# Patient Record
Sex: Male | Born: 1945 | Race: White | Hispanic: No | Smoking: Current every day smoker
Health system: Southern US, Community
[De-identification: ages and names within clinical notes are randomized; demographics above are authoritative.]

## PROBLEM LIST (undated history)

## (undated) DIAGNOSIS — I2510 Atherosclerotic heart disease of native coronary artery without angina pectoris: Secondary | ICD-10-CM

## (undated) DIAGNOSIS — I10 Essential (primary) hypertension: Secondary | ICD-10-CM

## (undated) DIAGNOSIS — I82409 Acute embolism and thrombosis of unspecified deep veins of unspecified lower extremity: Secondary | ICD-10-CM

## (undated) DIAGNOSIS — Z981 Arthrodesis status: Secondary | ICD-10-CM

## (undated) DIAGNOSIS — Z972 Presence of dental prosthetic device (complete) (partial): Secondary | ICD-10-CM

## (undated) DIAGNOSIS — E785 Hyperlipidemia, unspecified: Secondary | ICD-10-CM

## (undated) DIAGNOSIS — C61 Malignant neoplasm of prostate: Secondary | ICD-10-CM

## (undated) DIAGNOSIS — Z87891 Personal history of nicotine dependence: Secondary | ICD-10-CM

## (undated) DIAGNOSIS — I719 Aortic aneurysm of unspecified site, without rupture: Secondary | ICD-10-CM

## (undated) HISTORY — DX: Personal history of nicotine dependence: Z87.891

## (undated) HISTORY — DX: Aortic aneurysm of unspecified site, without rupture: I71.9

## (undated) HISTORY — DX: Hyperlipidemia, unspecified: E78.5

## (undated) HISTORY — DX: Malignant neoplasm of prostate: C61

---

## 1992-02-02 DIAGNOSIS — M51379 Other intervertebral disc degeneration, lumbosacral region without mention of lumbar back pain or lower extremity pain: Secondary | ICD-10-CM | POA: Insufficient documentation

## 1992-02-02 DIAGNOSIS — M5137 Other intervertebral disc degeneration, lumbosacral region: Secondary | ICD-10-CM | POA: Insufficient documentation

## 1996-02-02 DIAGNOSIS — Z981 Arthrodesis status: Secondary | ICD-10-CM

## 1996-02-02 HISTORY — DX: Arthrodesis status: Z98.1

## 1996-02-02 HISTORY — PX: BACK SURGERY: SHX140

## 2006-06-29 DIAGNOSIS — G4700 Insomnia, unspecified: Secondary | ICD-10-CM | POA: Insufficient documentation

## 2006-07-11 ENCOUNTER — Ambulatory Visit: Payer: Self-pay | Admitting: Gastroenterology

## 2008-08-16 ENCOUNTER — Ambulatory Visit: Payer: Self-pay | Admitting: Family Medicine

## 2010-09-02 ENCOUNTER — Ambulatory Visit: Payer: Self-pay | Admitting: Family Medicine

## 2010-10-02 ENCOUNTER — Inpatient Hospital Stay: Payer: Self-pay | Admitting: Surgery

## 2010-10-02 HISTORY — PX: CHOLECYSTECTOMY: SHX55

## 2010-10-02 HISTORY — PX: CT SCAN: SHX5351

## 2011-01-04 ENCOUNTER — Ambulatory Visit: Payer: Self-pay | Admitting: Family Medicine

## 2011-04-13 ENCOUNTER — Ambulatory Visit: Payer: Self-pay | Admitting: Family Medicine

## 2011-10-05 ENCOUNTER — Ambulatory Visit: Payer: Self-pay | Admitting: Family Medicine

## 2011-10-05 HISTORY — PX: OTHER SURGICAL HISTORY: SHX169

## 2011-10-21 ENCOUNTER — Ambulatory Visit: Payer: Self-pay | Admitting: Vascular Surgery

## 2011-11-25 ENCOUNTER — Ambulatory Visit: Payer: Self-pay | Admitting: Vascular Surgery

## 2011-11-25 LAB — CBC
HCT: 42.6 % (ref 40.0–52.0)
HGB: 14.7 g/dL (ref 13.0–18.0)
MCH: 32.1 pg (ref 26.0–34.0)
MCHC: 34.5 g/dL (ref 32.0–36.0)
RBC: 4.58 10*6/uL (ref 4.40–5.90)
WBC: 9.9 10*3/uL (ref 3.8–10.6)

## 2011-11-25 LAB — BASIC METABOLIC PANEL
Anion Gap: 5 — ABNORMAL LOW (ref 7–16)
BUN: 10 mg/dL (ref 7–18)
Co2: 32 mmol/L (ref 21–32)
Creatinine: 1.04 mg/dL (ref 0.60–1.30)
EGFR (African American): >60
Glucose: 102 mg/dL — ABNORMAL HIGH (ref 65–99)
Sodium: 137 mmol/L (ref 136–145)

## 2011-12-02 ENCOUNTER — Inpatient Hospital Stay: Payer: Self-pay | Admitting: Vascular Surgery

## 2011-12-02 HISTORY — PX: CAROTID ENDARTERECTOMY: SUR193

## 2011-12-03 LAB — CBC WITH DIFFERENTIAL/PLATELET
Basophil #: 0.1 10*3/uL (ref 0.0–0.1)
Eosinophil #: 0.2 10*3/uL (ref 0.0–0.7)
Lymphocyte #: 2.3 10*3/uL (ref 1.0–3.6)
Lymphocyte %: 19.9 %
Monocyte %: 8.3 %
Neutrophil %: 69.2 %
Platelet: 173 10*3/uL (ref 150–440)
RBC: 4.56 10*6/uL (ref 4.40–5.90)
RDW: 13.2 % (ref 11.5–14.5)
WBC: 11.4 10*3/uL — ABNORMAL HIGH (ref 3.8–10.6)

## 2011-12-03 LAB — BASIC METABOLIC PANEL
Anion Gap: 9 (ref 7–16)
BUN: 9 mg/dL (ref 7–18)
Calcium, Total: 8.7 mg/dL (ref 8.5–10.1)
Chloride: 108 mmol/L — ABNORMAL HIGH (ref 98–107)
Co2: 27 mmol/L (ref 21–32)
EGFR (African American): >60
Osmolality: 285 (ref 275–301)

## 2011-12-03 LAB — PROTIME-INR
INR: 1
Prothrombin Time: 13.2 secs (ref 11.5–14.7)

## 2011-12-03 LAB — APTT: Activated PTT: 28.5 secs (ref 23.6–35.9)

## 2011-12-06 LAB — PATHOLOGY REPORT

## 2012-08-01 ENCOUNTER — Ambulatory Visit: Payer: Self-pay | Admitting: Urology

## 2012-08-03 ENCOUNTER — Ambulatory Visit: Payer: Self-pay | Admitting: Urology

## 2012-10-19 HISTORY — PX: PROSTATE SURGERY: SHX751

## 2013-05-10 LAB — LIPID PANEL
Cholesterol: 123 mg/dL (ref 0–200)
HDL: 55 mg/dL (ref 35–70)
LDL Cholesterol: 53 mg/dL
TRIGLYCERIDES: 76 mg/dL (ref 40–160)

## 2013-05-10 LAB — HEPATIC FUNCTION PANEL: ALT: 19 U/L (ref 10–40)

## 2013-12-13 LAB — BASIC METABOLIC PANEL
BUN: 16 mg/dL (ref 4–21)
Creatinine: 1.2 mg/dL (ref 0.6–1.3)
Glucose: 93 mg/dL
Potassium: 4.8 mmol/L (ref 3.4–5.3)
SODIUM: 143 mmol/L (ref 137–147)

## 2013-12-13 LAB — PSA: PSA: <0.1

## 2014-02-08 DIAGNOSIS — H2513 Age-related nuclear cataract, bilateral: Secondary | ICD-10-CM | POA: Diagnosis not present

## 2014-02-21 DIAGNOSIS — H2513 Age-related nuclear cataract, bilateral: Secondary | ICD-10-CM | POA: Diagnosis not present

## 2014-02-27 ENCOUNTER — Ambulatory Visit: Payer: Self-pay | Admitting: Ophthalmology

## 2014-02-27 DIAGNOSIS — H5703 Miosis: Secondary | ICD-10-CM | POA: Diagnosis not present

## 2014-02-27 DIAGNOSIS — Z79899 Other long term (current) drug therapy: Secondary | ICD-10-CM | POA: Diagnosis not present

## 2014-02-27 DIAGNOSIS — G8929 Other chronic pain: Secondary | ICD-10-CM | POA: Diagnosis not present

## 2014-02-27 DIAGNOSIS — H2513 Age-related nuclear cataract, bilateral: Secondary | ICD-10-CM | POA: Diagnosis not present

## 2014-02-27 DIAGNOSIS — Z720 Tobacco use: Secondary | ICD-10-CM | POA: Diagnosis not present

## 2014-02-27 DIAGNOSIS — M479 Spondylosis, unspecified: Secondary | ICD-10-CM | POA: Diagnosis not present

## 2014-02-27 DIAGNOSIS — Z7982 Long term (current) use of aspirin: Secondary | ICD-10-CM | POA: Diagnosis not present

## 2014-02-27 DIAGNOSIS — M549 Dorsalgia, unspecified: Secondary | ICD-10-CM | POA: Diagnosis not present

## 2014-02-27 DIAGNOSIS — I10 Essential (primary) hypertension: Secondary | ICD-10-CM | POA: Diagnosis not present

## 2014-02-27 DIAGNOSIS — H2512 Age-related nuclear cataract, left eye: Secondary | ICD-10-CM | POA: Diagnosis not present

## 2014-04-01 DIAGNOSIS — F17200 Nicotine dependence, unspecified, uncomplicated: Secondary | ICD-10-CM | POA: Diagnosis not present

## 2014-04-01 DIAGNOSIS — I10 Essential (primary) hypertension: Secondary | ICD-10-CM | POA: Diagnosis not present

## 2014-04-01 DIAGNOSIS — Z0000 Encounter for general adult medical examination without abnormal findings: Secondary | ICD-10-CM | POA: Diagnosis not present

## 2014-04-01 DIAGNOSIS — Z1389 Encounter for screening for other disorder: Secondary | ICD-10-CM | POA: Diagnosis not present

## 2014-04-01 DIAGNOSIS — M5136 Other intervertebral disc degeneration, lumbar region: Secondary | ICD-10-CM | POA: Diagnosis not present

## 2014-05-21 NOTE — Discharge Summary (Signed)
PATIENT NAME:  Justin Harrington, Justin Harrington MR#:  [id number] DATE OF BIRTH:  October 21, 1945  DATE OF ADMISSION:  12/02/2011 DATE OF DISCHARGE:  12/03/2011  ADMITTING/DISCHARGE DIAGNOSES:  1. High-grade right carotid artery stenosis.  2. Hyperlipidemia.   PROCEDURE PERFORMED DURING HOSPITALIZATION: Right carotid endarterectomy. Please see the dictated Operative Summary for those details.   BRIEF HISTORY: 69 year old white male who was found to have a high-grade right carotid artery stenosis. He was prepared for stroke risk reduction and risks and benefits were discussed.   HOSPITAL COURSE: The patient was admitted through same day surgery and taken to the Operating Room where a right carotid endarterectomy was performed. For full details of that, please see the dictated operative summary. He did well overnight without major issues. His neck had no significant swelling and his incision was clean, dry and intact. His neurologic exam was normal. His labs in the morning following procedure were good and he was tolerating diet and voiding after catheter removal. He was deemed stable for discharge and discharged home accompanied by family.   DIET: Regular.   ACTIVITIES: As tolerated.   MEDICATIONS: Change his aspirin from 325 to 81 mg daily and to add 75 mg of Plavix daily and take all of his previous home medications.   FOLLOWUP: In 3 to 4 weeks in the office.  ____________________________ Algernon Huxley, MD jsd:ap D: 12/03/2011 13:14:43 ET          T: 12/03/2011 13:19:35 ET                 JOB#: [id number] cc: Algernon Huxley, MD, <Dictator> Algernon Huxley MD ELECTRONICALLY SIGNED 12/06/2011 13:14

## 2014-05-21 NOTE — Op Note (Signed)
PATIENT NAME:  Justin Harrington, Justin Harrington MR#:  [id number] DATE OF BIRTH:  May 01, 1945  DATE OF PROCEDURE:  12/02/2011  PREOPERATIVE DIAGNOSIS:  1. High-grade right carotid artery stenosis.  2. Hypertension.  3. Hyperlipidemia.   POSTOPERATIVE DIAGNOSIS:  1. High-grade right coronary artery stenosis.  2. Hypertension.  3. Hyperlipidemia.  PROCEDURE: Right carotid endarterectomy with CorMatrix patch angioplasty.   SURGEON: Algernon Huxley, MD   ANESTHESIA: General.   ESTIMATED BLOOD LOSS: Approximately 50 mL.   INDICATION FOR PROCEDURE: The patient is a gentleman who I saw in the office. He was evaluated with noninvasive studies and CT angiogram which demonstrated a high-grade right carotid artery stenosis of 88% or greater. We discussed the options and stroke risk reduction favoring intervention. He desired to proceed with endarterectomy. The risks and benefits were discussed. Informed consent was obtained.   DESCRIPTION OF PROCEDURE: The patient was brought to the operative suite. After an adequate level of general anesthesia was attained, the right neck was sterilely prepped and draped and a sterile surgical field was created. He was placed in modified beach chair position. An incision was created along the anterior border of the sternocleidomastoid, and we dissected down through platysma with electrocautery. The sternocleidomastoid was retracted laterally. This identified the facial vein, which was ligated and divided between silk ties. Below this was the carotid bifurcation. I encircled the common carotid artery, external carotid and internal carotid artery distal to the lesion with vessel loops, systemically heparinized the patient, and allowed this to circulate for approximately 5 to 6 minutes. I then pulled up control on the vessel loops. An anterior arteriotomy was created with an 11 blade and extended with Potts scissors. The Pruitt-Inahara shunt was placed first in the internal carotid artery,  flushed and de-aired, then the common carotid artery, flushed and de-aired, and the flow was then restored at this point. Endarterectomy was performed in the typical fashion. An eversion endarterectomy was performed in the external carotid artery. The proximal endpoint was cut flush with tenotomy scissors, and a nice feathered endpoint was created at the distal endpoint with gentle traction. The distal endpoint was tacked down with two 7-0 Prolene tacking sutures. The arterial defect was then closed with a CorMatrix extracellular matrix patch. We started at the distal endpoint, cut and beveled and run one-half the length of the arteriotomy. The patch was then cut and beveled to an appropriate length proximally, and a second 6-0 Prolene was started at the proximal endpoint. The medial suture line was run and tied together. The lateral suture line was run approximately one-quarter length of the arteriotomy, and then the shunt was removed. The arteriotomy was then completed after flushing through the external, internal, and common carotid arteries and flushed with heparinized saline. Several cardiac cycles were allowed to traverse up the external carotid artery prior to release of control. Approximately two minutes passed from clamp to shunt placement, and then two minutes passed from shunt removal to restoration. The wound was then irrigated, closed with 3-0 Vicryl sutures in the sternocleidomastoid space. A running 3-0 Vicryl was used to close platysma, and the skin was closed with 4-0 Monocryl. Dermabond was placed as a dressing. The patient tolerated the procedure well and was taken to the recovery room  in stable condition. ____________________________ Algernon Huxley, MD jsd:cbb D: 12/02/2011 15:05:48 ET T: 12/02/2011 15:27:19 ET JOB#: [id number]  cc: Algernon Huxley, MD, <Dictator> Algernon Huxley MD ELECTRONICALLY SIGNED 12/06/2011 13:14

## 2014-07-09 ENCOUNTER — Telehealth: Payer: Self-pay | Admitting: Family Medicine

## 2014-07-09 NOTE — Telephone Encounter (Signed)
Please advise 

## 2014-07-09 NOTE — Telephone Encounter (Signed)
Pt stated he got notice for Jury duty in July and would like Dr. Caryn Section to write a letter stating that due to his health and back pain he can not serve on jury duty. Thanks TNP

## 2014-07-10 ENCOUNTER — Encounter: Payer: Self-pay | Admitting: Family Medicine

## 2014-07-11 NOTE — Telephone Encounter (Signed)
Patient notified

## 2014-07-24 ENCOUNTER — Other Ambulatory Visit: Payer: Self-pay | Admitting: Family Medicine

## 2014-07-24 DIAGNOSIS — E7800 Pure hypercholesterolemia, unspecified: Secondary | ICD-10-CM

## 2014-07-24 DIAGNOSIS — E785 Hyperlipidemia, unspecified: Secondary | ICD-10-CM | POA: Insufficient documentation

## 2014-07-24 DIAGNOSIS — G4700 Insomnia, unspecified: Secondary | ICD-10-CM

## 2014-07-24 DIAGNOSIS — I10 Essential (primary) hypertension: Secondary | ICD-10-CM

## 2014-07-24 MED ORDER — TRAZODONE HCL 150 MG PO TABS: 150.0000 mg | ORAL_TABLET | Freq: Every day | ORAL | Status: DC

## 2014-07-24 MED ORDER — HYDROCHLOROTHIAZIDE 25 MG PO TABS: 25.0000 mg | ORAL_TABLET | Freq: Every day | ORAL | Status: DC

## 2014-07-24 MED ORDER — ATORVASTATIN CALCIUM 80 MG PO TABS: 80.0000 mg | ORAL_TABLET | Freq: Every day | ORAL | Status: DC

## 2014-07-24 NOTE — Telephone Encounter (Signed)
Pt contacted office for refill request on the following medications:  Trazodone HCI 150mg , Lipitor 80mg  and Hydrochlorothiazide 25mg . 90 day supply.  [facility] mail order.  RV#[phone]/MD

## 2014-08-06 ENCOUNTER — Other Ambulatory Visit: Payer: Self-pay | Admitting: Family Medicine

## 2014-08-06 DIAGNOSIS — M5136 Other intervertebral disc degeneration, lumbar region: Secondary | ICD-10-CM

## 2014-08-06 NOTE — Telephone Encounter (Signed)
Pt called to pick up his RX for pain medication  tp

## 2014-08-07 MED ORDER — OXYCODONE HCL 30 MG PO TABS: 30.0000 mg | ORAL_TABLET | ORAL | Status: DC

## 2014-09-02 ENCOUNTER — Other Ambulatory Visit: Payer: Self-pay | Admitting: Family Medicine

## 2014-09-02 NOTE — Telephone Encounter (Addendum)
Pt contacted office for refill request on the following medications:   OXYCODONE 30MG  IMMEDIATE REL.   YV#[phone]/PZ

## 2014-09-02 NOTE — Telephone Encounter (Signed)
Refill request for Oxycodone 30 mg Last filled by MD on- 07/02/2014 #180 x0 Last Appt: 04/01/2014 Next Appt: none Please advise refill?

## 2014-09-03 DIAGNOSIS — R972 Elevated prostate specific antigen [PSA]: Secondary | ICD-10-CM | POA: Insufficient documentation

## 2014-09-03 DIAGNOSIS — H269 Unspecified cataract: Secondary | ICD-10-CM | POA: Insufficient documentation

## 2014-09-03 DIAGNOSIS — Z86718 Personal history of other venous thrombosis and embolism: Secondary | ICD-10-CM | POA: Insufficient documentation

## 2014-09-03 DIAGNOSIS — Z8546 Personal history of malignant neoplasm of prostate: Secondary | ICD-10-CM | POA: Insufficient documentation

## 2014-09-03 MED ORDER — OXYCODONE HCL 30 MG PO TABS: ORAL_TABLET | ORAL | Status: DC

## 2014-09-04 ENCOUNTER — Ambulatory Visit (INDEPENDENT_AMBULATORY_CARE_PROVIDER_SITE_OTHER): Payer: Commercial Managed Care - HMO | Admitting: Family Medicine

## 2014-09-04 ENCOUNTER — Telehealth: Payer: Self-pay | Admitting: Family Medicine

## 2014-09-04 ENCOUNTER — Other Ambulatory Visit: Payer: Self-pay | Admitting: Family Medicine

## 2014-09-04 ENCOUNTER — Encounter: Payer: Self-pay | Admitting: Family Medicine

## 2014-09-04 VITALS — BP 92/60 | HR 94 | Temp 98.8°F | Resp 16 | Wt 178.0 lb

## 2014-09-04 DIAGNOSIS — N390 Urinary tract infection, site not specified: Secondary | ICD-10-CM

## 2014-09-04 DIAGNOSIS — R300 Dysuria: Secondary | ICD-10-CM | POA: Diagnosis not present

## 2014-09-04 DIAGNOSIS — R05 Cough: Secondary | ICD-10-CM | POA: Diagnosis not present

## 2014-09-04 DIAGNOSIS — R059 Cough, unspecified: Secondary | ICD-10-CM | POA: Insufficient documentation

## 2014-09-04 LAB — POCT URINALYSIS DIPSTICK
GLUCOSE UA: NEGATIVE
Nitrite, UA: NEGATIVE
Protein, UA: 100
Spec Grav, UA: >=1.03
Urobilinogen, UA: 1
pH, UA: 6

## 2014-09-04 MED ORDER — OXYCODONE HCL 30 MG PO TABS: ORAL_TABLET | ORAL | Status: DC

## 2014-09-04 MED ORDER — CIPROFLOXACIN HCL 500 MG PO TABS: 500.0000 mg | ORAL_TABLET | Freq: Two times a day (BID) | ORAL | Status: AC

## 2014-09-04 NOTE — Telephone Encounter (Signed)
Please review. Thanks!  

## 2014-09-04 NOTE — Telephone Encounter (Signed)
Pt states his Rx for oxycodone (ROXICODONE) 30 MG immediate release tablet was only written for 30 tablets.  Pt states has always got 180 tablet in the past.  CB#[phone]/MW

## 2014-09-04 NOTE — Telephone Encounter (Signed)
Pt advised-aa 

## 2014-09-04 NOTE — Progress Notes (Signed)
Patient: Justin Harrington Male    DOB: April 26, 1945   69 y.o.   MRN: [id number] Visit Date: 09/04/2014  Today's Provider: [name], MD   Chief Complaint  Patient presents with  . Urinary Urgency   Subjective:    HPI UTI Symptoms: Patient comes in today stating he has been having urinary frequency, urinary urgency and burling during urination for 1 month. He has also had cloudy urine with an odor. Patient denies any blood lin the urine. Patient has not taken anything to help with symptoms.  Patient has tried increasing his water intake.    Cough: He reports persistent cough for the last week. No dyspnea. Has had some drainage and chest congestion.     No Known Allergies Previous Medications   ASPIRIN EC 81 MG TABLET    Take 1 tablet by mouth daily.   ATORVASTATIN (LIPITOR) 80 MG TABLET    Take 1 tablet by mouth at bedtime.   HYDROCHLOROTHIAZIDE (HYDRODIURIL) 25 MG TABLET    Take 1 tablet by mouth daily.   MELATONIN 5 MG TABS    Take 1 tablet by mouth at bedtime.   OXYCODONE (ROXICODONE) 30 MG IMMEDIATE RELEASE TABLET    One every four hours as needed   TRAZODONE (DESYREL) 150 MG TABLET    Take 1 tablet by mouth at bedtime as needed.    Review of Systems  Constitutional: Negative for fever, chills, diaphoresis and fatigue.  HENT: Positive for rhinorrhea and sore throat. Negative for congestion, ear pain, nosebleeds, postnasal drip, sinus pressure, sneezing, tinnitus and trouble swallowing.   Respiratory: Positive for cough (dry cough).   Gastrointestinal: Positive for abdominal pain (right lower abdomen).  Genitourinary: Positive for dysuria, urgency, frequency and decreased urine volume. Negative for hematuria, flank pain, discharge, penile swelling, scrotal swelling, genital sores, penile pain and testicular pain.  Musculoskeletal: Positive for back pain.  All other systems reviewed and are negative.   History  Substance Use Topics  . Smoking status: Current Every  Day Smoker -- 0.75 packs/day for 40 years    Types: Cigarettes  . Smokeless tobacco: Not on file  . Alcohol Use: No   Objective:   BP 92/60 mmHg  Pulse 94  Temp(Src) 98.8 F (37.1 C) (Oral)  Resp 16  Wt 178 lb (80.74 kg)  SpO2 93%  Physical Exam  General Appearance:    Alert, cooperative, no distress  Eyes:    PERRL, conjunctiva/corneas clear, EOM's intact       Lungs:     Clear to auscultation bilaterally, respirations unlabored  Heart:    Regular rate and rhythm  Neurologic:   Awake, alert, oriented x 3. No apparent focal neurological           defect.   HEENT:   Mild congestion with post nasal drainage noted.         Assessment & Plan:     1. Dysuria  - POCT urinalysis dipstick  2. Urinary tract infection without hematuria, site unspecified  - ciprofloxacin (CIPRO) 500 MG tablet; Take 1 tablet (500 mg total) by mouth 2 (two) times daily.  Dispense: 20 tablet; Refill: 0  Call if symptoms change or if not rapidly improving.    3. Cough Likely due to post nasal drainage. Is going to try OTC Mucinex and call if not improving in a few days.        [name], MD  [facility]

## 2014-09-04 NOTE — Patient Instructions (Signed)
Recommend OTC Mucinex or guaifenesin for cough and chest congestion.

## 2014-09-04 NOTE — Addendum Note (Signed)
Addended by: Randal Buba on: 09/04/2014 09:23 AM   Modules accepted: Orders

## 2014-09-04 NOTE — Telephone Encounter (Signed)
OK. Have printed new prescription that he can pick up.

## 2014-09-06 LAB — URINE CULTURE

## 2014-10-08 DIAGNOSIS — H2511 Age-related nuclear cataract, right eye: Secondary | ICD-10-CM | POA: Diagnosis not present

## 2014-10-09 ENCOUNTER — Other Ambulatory Visit: Payer: Self-pay | Admitting: Family Medicine

## 2014-10-09 MED ORDER — OXYCODONE HCL 30 MG PO TABS: ORAL_TABLET | ORAL | Status: DC

## 2014-10-09 NOTE — Telephone Encounter (Signed)
Pt contacted office for refill request on the following medications:  oxycodone (ROXICODONE) 30 MG immediate release tablet.  WH#[phone]/KZ

## 2014-11-07 ENCOUNTER — Other Ambulatory Visit: Payer: Self-pay | Admitting: Family Medicine

## 2014-11-07 MED ORDER — OXYCODONE HCL 30 MG PO TABS: ORAL_TABLET | ORAL | Status: DC

## 2014-11-07 NOTE — Telephone Encounter (Signed)
Pt is requesting the a refill on oxycodone (ROXICODONE) 30 MG 180 tablets.   He is going out of town and needs it by Friday afternoon.

## 2014-11-07 NOTE — Telephone Encounter (Signed)
Last Ov was on 09/04/2014. Last refill was on 10/09/2014.  Thanks,

## 2014-12-10 ENCOUNTER — Other Ambulatory Visit: Payer: Self-pay | Admitting: Family Medicine

## 2014-12-10 MED ORDER — OXYCODONE HCL 30 MG PO TABS: ORAL_TABLET | ORAL | Status: DC

## 2014-12-10 NOTE — Telephone Encounter (Signed)
Please refill  oxycodone (ROXICODONE) 30 MG immediate release tablet  Thank sTeri

## 2014-12-17 DIAGNOSIS — I714 Abdominal aortic aneurysm, without rupture: Secondary | ICD-10-CM | POA: Diagnosis not present

## 2014-12-17 DIAGNOSIS — I10 Essential (primary) hypertension: Secondary | ICD-10-CM | POA: Diagnosis not present

## 2014-12-17 DIAGNOSIS — I6523 Occlusion and stenosis of bilateral carotid arteries: Secondary | ICD-10-CM | POA: Diagnosis not present

## 2014-12-17 DIAGNOSIS — E785 Hyperlipidemia, unspecified: Secondary | ICD-10-CM | POA: Diagnosis not present

## 2014-12-17 DIAGNOSIS — I6529 Occlusion and stenosis of unspecified carotid artery: Secondary | ICD-10-CM | POA: Diagnosis not present

## 2015-01-06 ENCOUNTER — Other Ambulatory Visit: Payer: Self-pay | Admitting: Family Medicine

## 2015-01-06 MED ORDER — OXYCODONE HCL 30 MG PO TABS: ORAL_TABLET | ORAL | Status: DC

## 2015-01-06 NOTE — Telephone Encounter (Signed)
Pt contacted office for refill request on the following medications: oxycodone (ROXICODONE) 30 MG immediate release tablet. Thanks TNP

## 2015-01-18 DIAGNOSIS — I6529 Occlusion and stenosis of unspecified carotid artery: Secondary | ICD-10-CM | POA: Diagnosis not present

## 2015-01-18 DIAGNOSIS — I700 Atherosclerosis of aorta: Secondary | ICD-10-CM | POA: Diagnosis not present

## 2015-01-18 DIAGNOSIS — I10 Essential (primary) hypertension: Secondary | ICD-10-CM | POA: Diagnosis not present

## 2015-01-18 DIAGNOSIS — E782 Mixed hyperlipidemia: Secondary | ICD-10-CM | POA: Diagnosis not present

## 2015-01-18 DIAGNOSIS — I714 Abdominal aortic aneurysm, without rupture: Secondary | ICD-10-CM | POA: Diagnosis not present

## 2015-01-18 DIAGNOSIS — Z0000 Encounter for general adult medical examination without abnormal findings: Secondary | ICD-10-CM | POA: Diagnosis not present

## 2015-01-18 DIAGNOSIS — M545 Low back pain: Secondary | ICD-10-CM | POA: Diagnosis not present

## 2015-01-18 DIAGNOSIS — G4700 Insomnia, unspecified: Secondary | ICD-10-CM | POA: Diagnosis not present

## 2015-02-06 ENCOUNTER — Other Ambulatory Visit: Payer: Self-pay | Admitting: Family Medicine

## 2015-02-06 NOTE — Telephone Encounter (Signed)
Pt needs refill oxycodone (ROXICODONE) 30 MG immediate release tablet  Please call when ready.  Thanks, [employer]

## 2015-02-06 NOTE — Telephone Encounter (Signed)
Patient requesting refill. 

## 2015-02-07 MED ORDER — OXYCODONE HCL 30 MG PO TABS: ORAL_TABLET | ORAL | Status: DC

## 2015-03-10 ENCOUNTER — Other Ambulatory Visit: Payer: Self-pay | Admitting: Family Medicine

## 2015-03-10 MED ORDER — OXYCODONE HCL 30 MG PO TABS: ORAL_TABLET | ORAL | Status: DC

## 2015-03-10 NOTE — Telephone Encounter (Signed)
Pt contacted office for refill request on the following medications:  oxycodone (ROXICODONE) 30 MG immediate release tablet.  CB#[phone]/MW ° °

## 2015-03-28 ENCOUNTER — Other Ambulatory Visit: Payer: Self-pay | Admitting: Family Medicine

## 2015-03-28 DIAGNOSIS — E7800 Pure hypercholesterolemia, unspecified: Secondary | ICD-10-CM

## 2015-03-28 NOTE — Telephone Encounter (Signed)
Pt contacted office for refill request on the following medications:  90 day supply  [facility] [place].  CB#[phone]/MW  hydrochlorothiazide (HYDRODIURIL) 25 MG tablet  atorvastatin (LIPITOR) 80 MG tablet  traZODone (DESYREL) 150 MG tablet

## 2015-03-31 MED ORDER — TRAZODONE HCL 150 MG PO TABS: 150.0000 mg | ORAL_TABLET | Freq: Every evening | ORAL | Status: DC | PRN

## 2015-03-31 MED ORDER — HYDROCHLOROTHIAZIDE 25 MG PO TABS: 25.0000 mg | ORAL_TABLET | Freq: Every day | ORAL | Status: DC

## 2015-03-31 MED ORDER — ATORVASTATIN CALCIUM 80 MG PO TABS: 80.0000 mg | ORAL_TABLET | Freq: Every day | ORAL | Status: DC

## 2015-04-07 ENCOUNTER — Other Ambulatory Visit: Payer: Self-pay | Admitting: Family Medicine

## 2015-04-07 MED ORDER — OXYCODONE HCL 30 MG PO TABS: ORAL_TABLET | ORAL | Status: DC

## 2015-04-07 NOTE — Telephone Encounter (Signed)
Pt contacted office for refill request on the following medications:  oxycodone (ROXICODONE) 30 MG immediate release tablet.  CB#[phone]/MW ° °

## 2015-04-09 ENCOUNTER — Ambulatory Visit (INDEPENDENT_AMBULATORY_CARE_PROVIDER_SITE_OTHER): Payer: PPO | Admitting: Family Medicine

## 2015-04-09 ENCOUNTER — Encounter: Payer: Self-pay | Admitting: Family Medicine

## 2015-04-09 VITALS — BP 104/60 | HR 96 | Temp 98.8°F | Resp 16 | Ht 73.0 in | Wt 182.0 lb

## 2015-04-09 DIAGNOSIS — M5136 Other intervertebral disc degeneration, lumbar region: Secondary | ICD-10-CM

## 2015-04-09 DIAGNOSIS — T402X5A Adverse effect of other opioids, initial encounter: Secondary | ICD-10-CM

## 2015-04-09 DIAGNOSIS — K5903 Drug induced constipation: Secondary | ICD-10-CM | POA: Diagnosis not present

## 2015-04-09 DIAGNOSIS — M51369 Other intervertebral disc degeneration, lumbar region without mention of lumbar back pain or lower extremity pain: Secondary | ICD-10-CM

## 2015-04-09 MED ORDER — NAPROXEN 500 MG PO TABS: 500.0000 mg | ORAL_TABLET | Freq: Two times a day (BID) | ORAL | Status: DC

## 2015-04-09 NOTE — Progress Notes (Signed)
       Patient: Justin Harrington Male    DOB: [birth year]   70 y.o.   MRN: [id number] Visit Date: 04/09/2015  Today's Provider: [name], MD   Chief Complaint  Patient presents with  . Medication Management   Subjective:    HPI  He states he would like to wean off of oxycodone which he takes for daily low back and hip pain. He has been on current regiment for many years, but is having increasing difficulties having bowel movements. He takes a powdered fiber product every day and has recently started taking a laxative. He states he gets very shaky, anxious and feels like skin is crawling if he doesn't take the oxycodone on schedule. He feels like he can get by with a milder pain medication.    No Known Allergies Previous Medications   ASPIRIN EC 81 MG TABLET    Take 1 tablet by mouth daily.   ATORVASTATIN (LIPITOR) 80 MG TABLET    Take 1 tablet (80 mg total) by mouth daily.   HYDROCHLOROTHIAZIDE (HYDRODIURIL) 25 MG TABLET    Take 1 tablet (25 mg total) by mouth daily.   OXYCODONE (ROXICODONE) 30 MG IMMEDIATE RELEASE TABLET    One every four hours as needed   TRAZODONE (DESYREL) 150 MG TABLET    Take 1 tablet (150 mg total) by mouth at bedtime.    Review of Systems  Constitutional: Negative for fever, chills and appetite change.  Respiratory: Negative for chest tightness, shortness of breath and wheezing.   Cardiovascular: Negative for chest pain and palpitations.  Gastrointestinal: Negative for nausea, vomiting and abdominal pain.    Social History  Substance Use Topics  . Smoking status: Current Every Day Smoker -- 0.75 packs/day for 40 years    Types: Cigarettes  . Smokeless tobacco: Not on file  . Alcohol Use: No   Objective:   BP 104/60 mmHg  Pulse 96  Temp(Src) 98.8 F (37.1 C) (Oral)  Resp 16  Ht 6\' 1"  (1.854 m)  Wt 182 lb (82.555 kg)  BMI 24.02 kg/m2  SpO2 96%  Physical Exam  General appearance: alert, well developed, well nourished, cooperative and in  no distress Head: Normocephalic, without obvious abnormality, atraumatic Lungs: Respirations even and unlabored Extremities: No gross deformities Skin: Skin color, texture, turgor normal. No rashes seen  Psych: Appropriate mood and affect. Neurologic: Mental status: Alert, oriented to person, place, and time, thought content appropriate.     Assessment & Plan:     1. Therapeutic opioid-induced constipation (OIC) Will work on weaning oxycodone as below. Continue daily fiber supplement and laxative as needed. If not improved with lower opioid dose will consider something like Amitiza  2. Degeneration of intervertebral disc of lumbar region He is going to reduce oxycodone to 1/2 of 30mg  tablet every four hours for the next week, then reduce to every 6-8 hours. If he does well on this he can call for prescription for 7.5mg  tablets. He is to follow up her in one month Start scheduled NSAID and recommend trial of OTC Lidocaine patches per label. - naproxen (NAPROSYN) 500 MG tablet; Take 1 tablet (500 mg total) by mouth 2 (two) times daily with a meal.  Dispense: 30 tablet; Refill: 1  Spent over half of this 30minute visit counseling regarding options for treatment of opioid withdrawel and chronic pain management.       [name], MD  [facility]

## 2015-04-09 NOTE — Patient Instructions (Addendum)
   Reduce oxycodone 30mg  to 1/2 tablet every four hours for the next week, then reduce to 1/2 tablet every six hours   Recommend try OTC Lidocaine 4% patch

## 2015-04-23 ENCOUNTER — Telehealth: Payer: Self-pay | Admitting: *Deleted

## 2015-04-23 NOTE — Telephone Encounter (Signed)
Called pt concerning a fax we received for a back brace. Need to no if pt requested this?

## 2015-04-24 NOTE — Telephone Encounter (Signed)
Patient returned call and stated that he did request back brace. Form returned to provider.

## 2015-04-28 ENCOUNTER — Telehealth: Payer: Self-pay | Admitting: Family Medicine

## 2015-04-28 NOTE — Telephone Encounter (Signed)
Pt is returning call.  [id number]

## 2015-04-28 NOTE — Telephone Encounter (Signed)
Return call

## 2015-05-08 ENCOUNTER — Encounter: Payer: Self-pay | Admitting: Family Medicine

## 2015-05-08 ENCOUNTER — Ambulatory Visit (INDEPENDENT_AMBULATORY_CARE_PROVIDER_SITE_OTHER): Payer: PPO | Admitting: Family Medicine

## 2015-05-08 VITALS — BP 110/58 | HR 83 | Temp 98.4°F | Resp 16 | Ht 73.0 in | Wt 184.0 lb

## 2015-05-08 DIAGNOSIS — M5136 Other intervertebral disc degeneration, lumbar region: Secondary | ICD-10-CM | POA: Diagnosis not present

## 2015-05-08 DIAGNOSIS — T402X5A Adverse effect of other opioids, initial encounter: Secondary | ICD-10-CM | POA: Diagnosis not present

## 2015-05-08 DIAGNOSIS — K5903 Drug induced constipation: Secondary | ICD-10-CM | POA: Diagnosis not present

## 2015-05-08 MED ORDER — OXYCODONE HCL 30 MG PO TABS: ORAL_TABLET | ORAL | Status: DC

## 2015-05-08 MED ORDER — NALOXEGOL OXALATE 25 MG PO TABS: 25.0000 mg | ORAL_TABLET | Freq: Every day | ORAL | Status: DC

## 2015-05-08 NOTE — Progress Notes (Signed)
       Patient: Justin Harrington Male    DOB: Oct 15, 1945   70 y.o.   MRN: [id number] Visit Date: 05/08/2015  Today's Provider: [name], MD   Chief Complaint  Patient presents with  . Follow-up   Subjective:    HPI  Follow-up for Therapeutic opioid-induced constipation from 04/09/2015;  Follow-up for  Degeneration of intervertebral disc of lumbar region from 04/09/2015;  He has reduced oxycodone to 5 a day. Has tried anti-inflammatory which didn't help at all.Is going to try to reduce down to 4 a day.   Patient is still having constipation. Only having one bowel movement per week. Having to take a handful of laxatives to have BM. He want's to try Movantik   No Known Allergies Previous Medications   ASPIRIN EC 81 MG TABLET    Take 1 tablet by mouth daily.   ATORVASTATIN (LIPITOR) 80 MG TABLET    Take 1 tablet (80 mg total) by mouth daily.   HYDROCHLOROTHIAZIDE (HYDRODIURIL) 25 MG TABLET    Take 1 tablet (25 mg total) by mouth daily.   OXYCODONE (ROXICODONE) 30 MG IMMEDIATE RELEASE TABLET    One every four hours as needed   TRAZODONE (DESYREL) 150 MG TABLET    Take 1 tablet (150 mg total) by mouth at bedtime.    Review of Systems  Constitutional: Negative for fever, chills and appetite change.  Respiratory: Negative for chest tightness, shortness of breath and wheezing.   Cardiovascular: Negative for chest pain and palpitations.  Gastrointestinal: Positive for constipation. Negative for nausea, vomiting and abdominal pain.    Social History  Substance Use Topics  . Smoking status: Current Every Day Smoker -- 0.75 packs/day for 40 years    Types: Cigarettes  . Smokeless tobacco: Not on file  . Alcohol Use: No   Objective:   BP 110/58 mmHg  Pulse 83  Temp(Src) 98.4 F (36.9 C) (Oral)  Resp 16  Ht 6\' 1"  (1.854 m)  Wt 184 lb (83.462 kg)  BMI 24.28 kg/m2  SpO2 95%  Physical Exam  General appearance: alert, well developed, well nourished, cooperative and in no  distress Head: Normocephalic, without obvious abnormality, atraumatic Lungs: Respirations even and unlabored Extremities: No gross deformities Skin: Skin color, texture, turgor normal. No rashes seen  Psych: Appropriate mood and affect. Neurologic: Mental status: Alert, oriented to person, place, and time, thought content appropriate.     Assessment & Plan:     1. Therapeutic opioid-induced constipation (OIC) Start Movantik - naloxegol oxalate (MOVANTIK) 25 MG TABS tablet; Take 1 tablet (25 mg total) by mouth daily.  Dispense: 30 tablet; Refill: 2  2. Degeneration of intervertebral disc of lumbar region Continue to work on weaning oxycodone.  - oxycodone (ROXICODONE) 30 MG immediate release tablet; One every four hours as needed  Dispense: 150 tablet; Refill: 0        [name], MD  [facility]

## 2015-05-19 DIAGNOSIS — M5137 Other intervertebral disc degeneration, lumbosacral region: Secondary | ICD-10-CM | POA: Diagnosis not present

## 2015-05-30 ENCOUNTER — Other Ambulatory Visit: Payer: Self-pay | Admitting: *Deleted

## 2015-05-30 NOTE — Telephone Encounter (Signed)
Called left message on vm, for pt to return call concerning a fax we received requesting a back brace. Want to know if pt requested this?

## 2015-06-05 ENCOUNTER — Encounter: Payer: Self-pay | Admitting: Family Medicine

## 2015-06-05 ENCOUNTER — Ambulatory Visit (INDEPENDENT_AMBULATORY_CARE_PROVIDER_SITE_OTHER): Payer: PPO | Admitting: Family Medicine

## 2015-06-05 VITALS — BP 108/70 | HR 86 | Temp 97.6°F | Resp 16 | Wt 182.0 lb

## 2015-06-05 DIAGNOSIS — M545 Low back pain, unspecified: Secondary | ICD-10-CM

## 2015-06-05 DIAGNOSIS — K5903 Drug induced constipation: Secondary | ICD-10-CM | POA: Diagnosis not present

## 2015-06-05 DIAGNOSIS — M5136 Other intervertebral disc degeneration, lumbar region: Secondary | ICD-10-CM | POA: Diagnosis not present

## 2015-06-05 DIAGNOSIS — T402X5A Adverse effect of other opioids, initial encounter: Secondary | ICD-10-CM | POA: Diagnosis not present

## 2015-06-05 DIAGNOSIS — Z720 Tobacco use: Secondary | ICD-10-CM

## 2015-06-05 MED ORDER — OXYCODONE HCL 30 MG PO TABS: ORAL_TABLET | ORAL | Status: DC

## 2015-06-05 NOTE — Progress Notes (Signed)
Patient: Justin Harrington Male    DOB: 02/02/1945   70 y.o.   MRN: [id number] Visit Date: 06/05/2015  Today's Provider: [name], MD   Chief Complaint  Patient presents with  . Back Pain    4 week follow up  . Constipation  . Hyperlipidemia   Subjective:    HPI Follow up Degeneration of intervertebral disc of lumbar region:  Patient was last seen 4 weeks ago. Management during that visit includes advising patient to continue weaning Oxycodone. Patient comes in today stating he has weaned himself down to 4 pills of Oxycodone daily. Patient states his back pain is unchanged since the last office visit.   Follow up Therapeutic Opoid-Induced Constipation:  Last office visit was 4 weeks ago. Changes made during that visit icludes starting Movantik. Patient states he took [place] for 4 days and saw no improvement with constipation so he stopped taking it. Patient states the [place] also caused him to have Headache, sweats, and dizziness. Patient has been taking Castor oil and prune juice which has helped improve constipation. Patient has an average of 1 bowel movement per week.   He also trying to quit smoking. He has smoked up to 1ppd for the last 50 years and request imaging studies of lungs.     No Known Allergies Previous Medications   ASPIRIN EC 81 MG TABLET    Take 1 tablet by mouth daily.   ATORVASTATIN (LIPITOR) 80 MG TABLET    Take 1 tablet (80 mg total) by mouth daily.   HYDROCHLOROTHIAZIDE (HYDRODIURIL) 25 MG TABLET    Take 1 tablet (25 mg total) by mouth daily.   NALOXEGOL OXALATE (MOVANTIK) 25 MG TABS TABLET    Take 1 tablet (25 mg total) by mouth daily.   OXYCODONE (ROXICODONE) 30 MG IMMEDIATE RELEASE TABLET    One every four hours as needed   TRAZODONE (DESYREL) 150 MG TABLET    Take 1 tablet (150 mg total) by mouth at bedtime.    Review of Systems  Constitutional: Negative for fever, chills and appetite change.  Respiratory: Negative for chest tightness,  shortness of breath and wheezing.   Cardiovascular: Negative for chest pain and palpitations.  Gastrointestinal: Positive for constipation. Negative for nausea, vomiting and abdominal pain.  Musculoskeletal: Positive for back pain.    Social History  Substance Use Topics  . Smoking status: Current Every Day Smoker -- 0.75 packs/day for 40 years    Types: Cigarettes  . Smokeless tobacco: Not on file  . Alcohol Use: No   Objective:   BP 108/70 mmHg  Pulse 86  Temp(Src) 97.6 F (36.4 C) (Oral)  Resp 16  Wt 182 lb (82.555 kg)  SpO2 98%  Physical Exam  General appearance: alert, well developed, well nourished, cooperative and in no distress Head: Normocephalic, without obvious abnormality, atraumatic Lungs: Respirations even and unlabored Extremities: No gross deformities Back: Tender over LS spine.       Assessment & Plan:     1. Degeneration of intervertebral disc of lumbar region Continue QID oxycodone. Work on reducing to TID.  - DG Lumbar Spine Complete; Future - oxycodone (ROXICODONE) 30 MG immediate release tablet; One every four hours as needed  Dispense: 120 tablet; Refill: 0  2. Tobacco abuse  - CT CHEST LUNG CA SCREEN LOW DOSE W/O CM; Future  3. Bilateral low back pain without sciatica  - DG Lumbar Spine Complete; Future  4. Therapeutic opioid-induced constipation (OIC) Intolerant  to Movantik, but doing well with combination OTC laxatives. For no.        [name], MD  [facility]

## 2015-06-09 ENCOUNTER — Telehealth: Payer: Self-pay | Admitting: *Deleted

## 2015-06-09 NOTE — Telephone Encounter (Signed)
Received referral for low dose lung cancer screening CT scan. Voicemail left at phone number listed in EMR for patient to call me back to facilitate scheduling scan.  

## 2015-06-12 ENCOUNTER — Other Ambulatory Visit: Payer: Self-pay | Admitting: Family Medicine

## 2015-06-12 ENCOUNTER — Encounter: Payer: Self-pay | Admitting: Family Medicine

## 2015-06-12 DIAGNOSIS — Z87891 Personal history of nicotine dependence: Secondary | ICD-10-CM

## 2015-06-12 HISTORY — DX: Personal history of nicotine dependence: Z87.891

## 2015-06-13 ENCOUNTER — Inpatient Hospital Stay: Payer: PPO | Attending: Family Medicine | Admitting: Family Medicine

## 2015-06-13 ENCOUNTER — Ambulatory Visit
Admission: RE | Admit: 2015-06-13 | Discharge: 2015-06-13 | Disposition: A | Discharge status: Home / Self Care | Payer: PPO | Source: Ambulatory Visit | Attending: Family Medicine | Admitting: Family Medicine

## 2015-06-13 ENCOUNTER — Encounter: Payer: Self-pay | Admitting: Family Medicine

## 2015-06-13 DIAGNOSIS — I700 Atherosclerosis of aorta: Secondary | ICD-10-CM | POA: Diagnosis not present

## 2015-06-13 DIAGNOSIS — Z87891 Personal history of nicotine dependence: Secondary | ICD-10-CM | POA: Insufficient documentation

## 2015-06-13 DIAGNOSIS — Z122 Encounter for screening for malignant neoplasm of respiratory organs: Secondary | ICD-10-CM

## 2015-06-13 DIAGNOSIS — J439 Emphysema, unspecified: Secondary | ICD-10-CM | POA: Diagnosis not present

## 2015-06-13 NOTE — Progress Notes (Addendum)
In accordance with CMS guidelines, patient has meet eligibility criteria including age, absence of signs or symptoms of lung cancer, the specific calculation of cigarette smoking pack-years was 37.5 years and is a current smoker.   A shared decision-making session was conducted prior to the performance of CT scan. This includes one or more decision aids, includes benefits and harms of screening, follow-up diagnostic testing, over-diagnosis, false positive rate, and total radiation exposure.  Counseling on the importance of adherence to annual lung cancer LDCT screening, impact of co-morbidities, and ability or willingness to undergo diagnosis and treatment is imperative for compliance of the program.  Counseling on the importance of continued smoking cessation for former smokers; the importance of smoking cessation for current smokers and information about tobacco cessation interventions have been given to patient including the [place] at [facility], 1800 quit [place], as well as [place] specific smoking cessation programs.  Written order for lung cancer screening with LDCT has been given to the patient and any and all questions have been answered to the best of my abilities.   Yearly follow up will be scheduled by Burgess Estelle, Thoracic Navigator.

## 2015-06-16 DIAGNOSIS — I714 Abdominal aortic aneurysm, without rupture: Secondary | ICD-10-CM | POA: Diagnosis not present

## 2015-06-16 DIAGNOSIS — E785 Hyperlipidemia, unspecified: Secondary | ICD-10-CM | POA: Diagnosis not present

## 2015-06-16 DIAGNOSIS — I10 Essential (primary) hypertension: Secondary | ICD-10-CM | POA: Diagnosis not present

## 2015-06-16 DIAGNOSIS — I6529 Occlusion and stenosis of unspecified carotid artery: Secondary | ICD-10-CM | POA: Diagnosis not present

## 2015-06-16 DIAGNOSIS — I6523 Occlusion and stenosis of bilateral carotid arteries: Secondary | ICD-10-CM | POA: Diagnosis not present

## 2015-06-17 ENCOUNTER — Telehealth: Payer: Self-pay | Admitting: *Deleted

## 2015-06-17 NOTE — Telephone Encounter (Signed)
Notified patient of LDCT lung cancer screening results of Lung Rads 2 finding with recommendation for 12 month follow up imaging. Also notified of incidental finding noted below. Patient verbalizes understanding.   IMPRESSION: 1. Lung-RADS Category 2, benign appearance or behavior. Continue annual screening with low-dose chest CT without contrast in 12 months 2. Emphysema 3. Aortic atherosclerosis and multi vessel coronary artery calcification.

## 2015-06-25 ENCOUNTER — Telehealth: Payer: Self-pay | Admitting: Family Medicine

## 2015-06-25 NOTE — Telephone Encounter (Signed)
Pt stated that when he was in the office for an OV on 06/05/15, he thought that he was supposed to have an MRI of his back to check his spinal fusion that he had done years ago. Pt stated he had the scan of his lungs done but wanted to get a scan of his back as well. Please advise. Thanks TNP

## 2015-06-26 NOTE — Telephone Encounter (Signed)
Tried calling patient. Left message to call back. 

## 2015-06-26 NOTE — Telephone Encounter (Signed)
We order an Xray of his spine. He does not need an appointment. He just needs to go to [facility].  An MRI is only indicated if there is a problem seen on Xr, or if starts going numb in legs.

## 2015-06-27 NOTE — Telephone Encounter (Signed)
Pt advised-aa 

## 2015-07-01 ENCOUNTER — Ambulatory Visit
Admission: RE | Admit: 2015-07-01 | Discharge: 2015-07-01 | Disposition: A | Discharge status: Home / Self Care | Payer: PPO | Source: Ambulatory Visit | Attending: Family Medicine | Admitting: Family Medicine

## 2015-07-01 DIAGNOSIS — M545 Low back pain, unspecified: Secondary | ICD-10-CM

## 2015-07-01 DIAGNOSIS — M5136 Other intervertebral disc degeneration, lumbar region: Secondary | ICD-10-CM

## 2015-07-01 DIAGNOSIS — M51369 Other intervertebral disc degeneration, lumbar region without mention of lumbar back pain or lower extremity pain: Secondary | ICD-10-CM

## 2015-07-02 ENCOUNTER — Telehealth: Payer: Self-pay | Admitting: *Deleted

## 2015-07-02 ENCOUNTER — Telehealth: Payer: Self-pay | Admitting: Family Medicine

## 2015-07-02 DIAGNOSIS — R52 Pain, unspecified: Secondary | ICD-10-CM

## 2015-07-02 DIAGNOSIS — M5136 Other intervertebral disc degeneration, lumbar region: Secondary | ICD-10-CM

## 2015-07-02 NOTE — Telephone Encounter (Signed)
Please schedule referral to pain clinic. Thanks!

## 2015-07-02 NOTE — Telephone Encounter (Signed)
Patient is requesting a refill for oxycodone 30 mg. Patient has been getting a quantity of #120, however patient wanted to know if he have go back up to a quantity of #150 qd? Patient stated that his pain is more manageable taking 5 tablets daily. Please advise?

## 2015-07-02 NOTE — Telephone Encounter (Signed)
Patient stated that he did want the back brace but changed his after he saw the brace.

## 2015-07-02 NOTE — Telephone Encounter (Signed)
Patient was notified of results. Patient expressed understanding. Patient is agreeable to pain clinic referral.

## 2015-07-02 NOTE — Telephone Encounter (Signed)
-----   Message from [name], MD sent at 07/02/2015  8:00 AM EDT ----- Justin Harrington shows all the hardware in his back is still in place and normally aligned. If pain is getting worse he should consider going to pain clinic for treatment. May benefit from epidural injections.

## 2015-07-04 MED ORDER — OXYCODONE HCL 30 MG PO TABS: ORAL_TABLET | ORAL | Status: DC

## 2015-08-01 ENCOUNTER — Other Ambulatory Visit: Payer: Self-pay | Admitting: Family Medicine

## 2015-08-01 DIAGNOSIS — M5136 Other intervertebral disc degeneration, lumbar region: Secondary | ICD-10-CM

## 2015-08-01 NOTE — Telephone Encounter (Signed)
Patient is ok to wait for Dr Caryn Section, he will run out on Tuesday July 4th, last fill was 07/02/15 and LOV 06/05/15-aa

## 2015-08-01 NOTE — Telephone Encounter (Signed)
Patient requesting a refill on oxycodone (ROXICODONE) 30 MG immediate release tablet

## 2015-08-04 NOTE — Telephone Encounter (Signed)
Pt called to see if RX for oxycodone (ROXICODONE) 30 MG immediate release tablet was ready. Please advise. Thanks TNP

## 2015-08-06 MED ORDER — OXYCODONE HCL 30 MG PO TABS: ORAL_TABLET | ORAL | Status: DC

## 2015-08-06 NOTE — Telephone Encounter (Signed)
Pt is requesting to pick this up today due to he only have enough medication for today.  [id number]

## 2015-08-22 ENCOUNTER — Encounter: Payer: Self-pay | Admitting: Family Medicine

## 2015-09-08 ENCOUNTER — Other Ambulatory Visit: Payer: Self-pay | Admitting: Family Medicine

## 2015-09-08 DIAGNOSIS — M5136 Other intervertebral disc degeneration, lumbar region: Secondary | ICD-10-CM

## 2015-09-08 MED ORDER — OXYCODONE HCL 30 MG PO TABS: ORAL_TABLET | ORAL | 0 refills | Status: DC

## 2015-09-08 NOTE — Telephone Encounter (Signed)
Pt needs refill on his oxycodone 30mg  Please call when ready. [phone]  Thanks Con Memos

## 2015-09-12 ENCOUNTER — Telehealth: Payer: Self-pay | Admitting: Pain Medicine

## 2015-09-12 NOTE — Telephone Encounter (Signed)
Patient is sched Sept 14 at 2:  With Dr. Dossie Arbour, pckt was mailed 09-15-15, patient said to tell Rozetta Nunnery thank you for extremely nice msg. Left on his vmail

## 2015-10-08 ENCOUNTER — Ambulatory Visit (INDEPENDENT_AMBULATORY_CARE_PROVIDER_SITE_OTHER): Payer: PPO | Admitting: Family Medicine

## 2015-10-08 ENCOUNTER — Encounter: Payer: Self-pay | Admitting: Family Medicine

## 2015-10-08 VITALS — BP 120/70 | HR 82 | Temp 97.8°F | Resp 16 | Wt 178.0 lb

## 2015-10-08 DIAGNOSIS — N390 Urinary tract infection, site not specified: Secondary | ICD-10-CM

## 2015-10-08 DIAGNOSIS — R52 Pain, unspecified: Secondary | ICD-10-CM

## 2015-10-08 DIAGNOSIS — Z5189 Encounter for other specified aftercare: Secondary | ICD-10-CM | POA: Diagnosis not present

## 2015-10-08 DIAGNOSIS — M5136 Other intervertebral disc degeneration, lumbar region: Secondary | ICD-10-CM

## 2015-10-08 MED ORDER — OXYCODONE HCL 30 MG PO TABS: ORAL_TABLET | ORAL | 0 refills | Status: DC

## 2015-10-08 MED ORDER — CIPROFLOXACIN HCL 500 MG PO TABS: 500.0000 mg | ORAL_TABLET | Freq: Two times a day (BID) | ORAL | 0 refills | Status: AC

## 2015-10-08 NOTE — Progress Notes (Signed)
Patient: Justin Harrington Male    DOB: 1946-01-03   70 y.o.   MRN: [id number] Visit Date: 10/08/2015  Today's Provider: [name], MD   Chief Complaint  Patient presents with  . Urinary Frequency   Subjective:    Patient has had urine frequency, urgency and decreased urine since Saturday 10/04/2015. Patient has been taking azo with moderate relief. No pain, no fever.   Urinary Frequency   This is a new problem. The current episode started in the past 7 days (3 days ago). The problem occurs every urination. The problem has been unchanged. The patient is experiencing no pain. There has been no fever. Associated symptoms include frequency, hesitancy and urgency. Pertinent negatives include no chills, discharge, flank pain, hematuria, nausea, possible pregnancy, sweats or vomiting. Treatments tried: azo. The treatment provided moderate relief.       Allergies  Allergen Reactions  . Movantik [Naloxegol]     Sweats, headache, upset stomach     Current Outpatient Prescriptions:  .  aspirin EC 81 MG tablet, Take 1 tablet by mouth daily., Disp: , Rfl:  .  atorvastatin (LIPITOR) 80 MG tablet, Take 1 tablet (80 mg total) by mouth daily., Disp: 90 tablet, Rfl: 4 .  hydrochlorothiazide (HYDRODIURIL) 25 MG tablet, Take 1 tablet (25 mg total) by mouth daily., Disp: 90 tablet, Rfl: 4 .  oxycodone (ROXICODONE) 30 MG immediate release tablet, One every four hours as needed, Disp: 150 tablet, Rfl: 0 .  traZODone (DESYREL) 150 MG tablet, Take 1 tablet (150 mg total) by mouth at bedtime., Disp: 90 tablet, Rfl: 3  Review of Systems  Constitutional: Negative for chills.  Gastrointestinal: Negative for nausea and vomiting.  Genitourinary: Positive for decreased urine volume, frequency, hesitancy and urgency. Negative for flank pain and hematuria.    Social History  Substance Use Topics  . Smoking status: Current Every Day Smoker    Packs/day: 0.75    Years: 50.00    Types: Cigarettes    . Smokeless tobacco: Not on file  . Alcohol use No   Objective:   BP 120/70 (BP Location: Left Arm, Patient Position: Sitting, Cuff Size: Normal)   Pulse 82   Temp 97.8 F (36.6 C) (Oral)   Resp 16   Wt 178 lb (80.7 kg)   BMI 22.85 kg/m   Physical Exam   General Appearance:    Alert, cooperative, no distress  Eyes:    PERRL, conjunctiva/corneas clear, EOM's intact       Lungs:     Clear to auscultation bilaterally, respirations unlabored  Heart:    Regular rate and rhythm  Neurologic:   Awake, alert, oriented x 3. No apparent focal neurological           defect.       U/a Few WBC, no bacteria, no RBC, few Epis     Assessment & Plan:     1. Urinary tract infection without hematuria, site unspecified Start cipro while awaiting cultures. If negative he is to follow up with his urologist.  - ciprofloxacin (CIPRO) 500 MG tablet; Take 1 tablet (500 mg total) by mouth 2 (two) times daily.  Dispense: 14 tablet; Refill: 0 - Urine culture  2. Degeneration of intervertebral disc of lumbar region He requests refill for oxycodone today, He has reduced to 5 per day and would like to wean down to 4 daily. Rx refilled today.  - oxycodone (ROXICODONE) 30 MG immediate release tablet; One every  four to six hours as needed  Dispense: 120 tablet; Refill: 0  3. Pain management        [name], MD  [facility]

## 2015-10-09 LAB — URINE CULTURE: Organism ID, Bacteria: NO GROWTH

## 2015-10-10 ENCOUNTER — Telehealth: Payer: Self-pay

## 2015-10-10 NOTE — Telephone Encounter (Signed)
Patient advised as below. Patient reports he is feeling better

## 2015-10-10 NOTE — Telephone Encounter (Signed)
-----   Message from [name], MD sent at 10/09/2015 10:00 PM EDT ----- Urine cultures are negative. Can stop antiobiotic. If not improving then need to follow up with urologist.

## 2015-10-16 ENCOUNTER — Other Ambulatory Visit
Admission: RE | Admit: 2015-10-16 | Discharge: 2015-10-16 | Disposition: A | Discharge status: Home / Self Care | Payer: PPO | Source: Ambulatory Visit | Attending: Pain Medicine | Admitting: Pain Medicine

## 2015-10-16 ENCOUNTER — Encounter: Payer: Self-pay | Admitting: Pain Medicine

## 2015-10-16 ENCOUNTER — Ambulatory Visit: Payer: PPO | Attending: Pain Medicine | Admitting: Pain Medicine

## 2015-10-16 VITALS — BP 108/63 | HR 68 | Temp 98.4°F | Resp 18 | Ht 73.0 in | Wt 175.0 lb

## 2015-10-16 DIAGNOSIS — Z79891 Long term (current) use of opiate analgesic: Secondary | ICD-10-CM | POA: Insufficient documentation

## 2015-10-16 DIAGNOSIS — Z981 Arthrodesis status: Secondary | ICD-10-CM | POA: Insufficient documentation

## 2015-10-16 DIAGNOSIS — F329 Major depressive disorder, single episode, unspecified: Secondary | ICD-10-CM | POA: Insufficient documentation

## 2015-10-16 DIAGNOSIS — M539 Dorsopathy, unspecified: Secondary | ICD-10-CM

## 2015-10-16 DIAGNOSIS — M961 Postlaminectomy syndrome, not elsewhere classified: Secondary | ICD-10-CM | POA: Insufficient documentation

## 2015-10-16 DIAGNOSIS — M5136 Other intervertebral disc degeneration, lumbar region: Secondary | ICD-10-CM | POA: Insufficient documentation

## 2015-10-16 DIAGNOSIS — Z0189 Encounter for other specified special examinations: Secondary | ICD-10-CM

## 2015-10-16 DIAGNOSIS — I10 Essential (primary) hypertension: Secondary | ICD-10-CM | POA: Diagnosis not present

## 2015-10-16 DIAGNOSIS — H269 Unspecified cataract: Secondary | ICD-10-CM | POA: Insufficient documentation

## 2015-10-16 DIAGNOSIS — M47816 Spondylosis without myelopathy or radiculopathy, lumbar region: Secondary | ICD-10-CM | POA: Insufficient documentation

## 2015-10-16 DIAGNOSIS — F1190 Opioid use, unspecified, uncomplicated: Secondary | ICD-10-CM | POA: Insufficient documentation

## 2015-10-16 DIAGNOSIS — G8929 Other chronic pain: Secondary | ICD-10-CM | POA: Insufficient documentation

## 2015-10-16 DIAGNOSIS — E785 Hyperlipidemia, unspecified: Secondary | ICD-10-CM | POA: Diagnosis not present

## 2015-10-16 DIAGNOSIS — Z86718 Personal history of other venous thrombosis and embolism: Secondary | ICD-10-CM | POA: Diagnosis not present

## 2015-10-16 DIAGNOSIS — K5730 Diverticulosis of large intestine without perforation or abscess without bleeding: Secondary | ICD-10-CM | POA: Insufficient documentation

## 2015-10-16 DIAGNOSIS — T402X5A Adverse effect of other opioids, initial encounter: Secondary | ICD-10-CM | POA: Insufficient documentation

## 2015-10-16 DIAGNOSIS — K5903 Drug induced constipation: Secondary | ICD-10-CM | POA: Diagnosis not present

## 2015-10-16 DIAGNOSIS — M545 Low back pain: Secondary | ICD-10-CM | POA: Insufficient documentation

## 2015-10-16 DIAGNOSIS — N529 Male erectile dysfunction, unspecified: Secondary | ICD-10-CM | POA: Insufficient documentation

## 2015-10-16 DIAGNOSIS — G4700 Insomnia, unspecified: Secondary | ICD-10-CM | POA: Insufficient documentation

## 2015-10-16 DIAGNOSIS — C61 Malignant neoplasm of prostate: Secondary | ICD-10-CM | POA: Insufficient documentation

## 2015-10-16 DIAGNOSIS — I2510 Atherosclerotic heart disease of native coronary artery without angina pectoris: Secondary | ICD-10-CM | POA: Insufficient documentation

## 2015-10-16 DIAGNOSIS — Z7982 Long term (current) use of aspirin: Secondary | ICD-10-CM | POA: Diagnosis not present

## 2015-10-16 DIAGNOSIS — M5442 Lumbago with sciatica, left side: Secondary | ICD-10-CM

## 2015-10-16 DIAGNOSIS — I719 Aortic aneurysm of unspecified site, without rupture: Secondary | ICD-10-CM | POA: Diagnosis not present

## 2015-10-16 DIAGNOSIS — F17200 Nicotine dependence, unspecified, uncomplicated: Secondary | ICD-10-CM | POA: Insufficient documentation

## 2015-10-16 DIAGNOSIS — Z79899 Other long term (current) drug therapy: Secondary | ICD-10-CM

## 2015-10-16 DIAGNOSIS — Z9889 Other specified postprocedural states: Secondary | ICD-10-CM

## 2015-10-16 DIAGNOSIS — Z5181 Encounter for therapeutic drug level monitoring: Secondary | ICD-10-CM | POA: Insufficient documentation

## 2015-10-16 DIAGNOSIS — M5441 Lumbago with sciatica, right side: Secondary | ICD-10-CM

## 2015-10-16 LAB — COMPREHENSIVE METABOLIC PANEL
ALT: 16 U/L — AB (ref 17–63)
ANION GAP: 3 — AB (ref 5–15)
AST: 18 U/L (ref 15–41)
Albumin: 4 g/dL (ref 3.5–5.0)
Alkaline Phosphatase: 50 U/L (ref 38–126)
BUN: 19 mg/dL (ref 6–20)
CHLORIDE: 100 mmol/L — AB (ref 101–111)
CO2: 36 mmol/L — AB (ref 22–32)
CREATININE: 1.19 mg/dL (ref 0.61–1.24)
Calcium: 9.4 mg/dL (ref 8.9–10.3)
Glucose, Bld: 112 mg/dL — ABNORMAL HIGH (ref 65–99)
POTASSIUM: 3.2 mmol/L — AB (ref 3.5–5.1)
SODIUM: 139 mmol/L (ref 135–145)
Total Bilirubin: 0.8 mg/dL (ref 0.3–1.2)
Total Protein: 7.1 g/dL (ref 6.5–8.1)

## 2015-10-16 LAB — SEDIMENTATION RATE: SED RATE: 1 mm/h (ref 0–20)

## 2015-10-16 LAB — MAGNESIUM: MAGNESIUM: 1.6 mg/dL — AB (ref 1.7–2.4)

## 2015-10-16 MED ORDER — NALOXONE HCL 2 MG/2ML IJ SOSY: PREFILLED_SYRINGE | INTRAMUSCULAR | 1 refills | Status: DC

## 2015-10-16 MED ORDER — LUBIPROSTONE 24 MCG PO CAPS: 24.0000 ug | ORAL_CAPSULE | Freq: Two times a day (BID) | ORAL | 99 refills | Status: DC

## 2015-10-16 MED ORDER — BENEFIBER PO POWD: ORAL | 99 refills | Status: DC

## 2015-10-16 NOTE — Progress Notes (Signed)
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 substance use disorder evaluation  Requested PM Follow-up: Return for 2nd Visit Eval, After [facility] Eval.  No future appointments.  Primary Care Physician: [name], MD Location: [facility] Note by: Kathlen Brunswick. Dossie Arbour, M.D, DABA, DABAPM, DABPM, DABIPP, FIPP  Pain Score Disclaimer: We use the NRS-11 scale. This is a self-reported, subjective measurement of pain severity with only modest accuracy. It is used primarily to identify changes within a particular patient. It must be understood that outpatient pain scales are significantly less accurate that those used for research, where they can be applied under ideal controlled circumstances with minimal exposure to variables. In reality, the score is likely to be a combination of pain intensity and pain affect, where pain affect describes the degree of emotional arousal or changes in action readiness caused by the sensory  experience of pain. Factors such as social and work situation, setting, emotional state, anxiety levels, expectation, and prior pain experience may influence pain perception and show large inter-individual differences that may also be affected by time variables.  Patient instructions provided during this appointment: There are no Patient Instructions on file for this visit.
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
## 2015-10-17 LAB — VITAMIN B12: Vitamin B-12: 206 pg/mL (ref 180–914)

## 2015-10-17 LAB — C-REACTIVE PROTEIN: CRP: 2.7 mg/dL — AB (ref <1.0)

## 2015-10-22 LAB — 25-HYDROXY VITAMIN D LCMS D2+D3
25-Hydroxy, Vitamin D-2: <1 ng/mL
25-Hydroxy, Vitamin D-3: 44 ng/mL
25-Hydroxy, Vitamin D: 45 ng/mL

## 2015-10-26 LAB — COMPLIANCE DRUG ANALYSIS, UR

## 2015-10-30 NOTE — Progress Notes (Signed)
Normal Magnesium levels are between 1.6 and 2.3 mEq/L. Low magnesium blood level can lead to low calcium and potassium levels. Low levels may indicate inadequate dietary consuming, poor absorbtion, or excessive excretion. Signs and symptoms may include: leg cramps, foot pain, muscle twitches, loss of appetite, nausea, vomiting, fatigue, weakness, numbness, tingling, seizures, personality changes, abnormal heart rhythms, and/or coronary artery spasms.

## 2015-10-30 NOTE — Progress Notes (Signed)
Normal levels of C-Reactive Protein for our Lab are less than 1.0 mg/L. C-reactive protein (CRP) is produced by the liver. The level of CRP rises when there is inflammation throughout the body. CRP goes up in response to inflammation. High levels suggests the presence of chronic inflammation but do not identify its location or cause. High levels have been observed in obese patients, individuals with bacterial infections, chronic inflammation, or flare-ups of inflammatory conditions. Drops of previously elevated levels suggest that the inflammation or infection is subsiding and/or responding to treatment.

## 2015-10-30 NOTE — Progress Notes (Signed)
Potassium levels below 3.6 mmol/L are considered to be low. Levels (less than 2.5 mmol/L) can be life-threatening and requires urgent medical attention. Low potassium (hypokalemia) has many causes. The most common cause is excessive potassium loss in urine due to prescription water or fluid pills (diuretics). Vomiting or diarrhea or both can result in excessive potassium loss from the digestive tract. Causes of potassium loss leading to low potassium include: chronic kidney disease; diabetic ketoacidosis; diarrhea; excessive alcohol use; excessive laxative use; excessive sweating; folic acid deficiency; diuretics; primary aldosteronism; vomiting; and/or some antibiotic use. Normal chloride levels are between 95 and 107 mEq/L. Low levels may be due to: Addison disease; Bartter syndrome; burns; congestive heart failure; dehydration; excessive sweating; hyperaldosteronism; metabolic alkalosis; respiratory acidosis (compensated); Syndrome of inappropriate diuretic hormone secretion (SIADH); or vomiting. Most of the CO2 in the body is in the form of bicarbonate (HCO3-). Therefore, the CO2 blood test is really a measure of bicarbonate levels. kidneys help maintain the normal bicarbonate levels. The normal range is between 22 and 28 mEq/L, for our Lab. Higher levels may suggest alkalosis; renal tubular acidosis; breathing disorders; cushing syndrome; and hyperaldosteronism among others. Normal fasting (NPO x 8 hours) glucose levels are between 65-99 mg/dl, with 2 hour fasting, levels are usually less than 140 mg/dl. Any random blood glucose level greater than 200 mg/dl is considered to be Diabetes. While most low ALT level results indicate a normal healthy liver, that may not always be the case. A low-functioning or non-functioning liver, lacking normal levels of ALT activity to begin with, would not release a lot of ALT into the blood when damaged. People infected with the hepatitis C virus initially show high ALT  levels in their blood, but these levels fall over time. Because the ALT test measures ALT levels at only one point in time, people with chronic hepatitis C infection may already have experienced the ALT peak well before blood was drawn for the ALT test. Urinary tract infections or malnutrition may also cause low blood ALT levels.

## 2015-11-06 DIAGNOSIS — F4521 Hypochondriasis: Secondary | ICD-10-CM | POA: Diagnosis not present

## 2015-11-07 ENCOUNTER — Other Ambulatory Visit: Payer: Self-pay

## 2015-11-07 ENCOUNTER — Other Ambulatory Visit: Payer: Self-pay | Admitting: Family Medicine

## 2015-11-07 DIAGNOSIS — M5136 Other intervertebral disc degeneration, lumbar region: Secondary | ICD-10-CM

## 2015-11-07 MED ORDER — OXYCODONE HCL 30 MG PO TABS: ORAL_TABLET | ORAL | 0 refills | Status: DC

## 2015-11-07 NOTE — Telephone Encounter (Signed)
Pt advised RX up front-aa

## 2015-11-07 NOTE — Telephone Encounter (Signed)
Pt contacted office for refill request on the following medications: oxycodone (ROXICODONE) 30 MG immediate release tablet Last written: 10/08/15 Last OV: 10/08/15 Please advise. Thanks TNP

## 2015-12-01 DIAGNOSIS — H2511 Age-related nuclear cataract, right eye: Secondary | ICD-10-CM | POA: Diagnosis not present

## 2015-12-08 ENCOUNTER — Other Ambulatory Visit: Payer: Self-pay | Admitting: Family Medicine

## 2015-12-08 DIAGNOSIS — M5136 Other intervertebral disc degeneration, lumbar region: Secondary | ICD-10-CM

## 2015-12-08 MED ORDER — OXYCODONE HCL 30 MG PO TABS: ORAL_TABLET | ORAL | 0 refills | Status: DC

## 2015-12-08 NOTE — Telephone Encounter (Signed)
Please review. Thanks!  

## 2015-12-08 NOTE — Telephone Encounter (Signed)
Pt needs refill on his oxycodone 30 mg  He would like to pick up this afternoon if possible.  Thanks Con Memos

## 2015-12-10 ENCOUNTER — Ambulatory Visit: Payer: PPO | Attending: Pain Medicine | Admitting: Pain Medicine

## 2015-12-10 ENCOUNTER — Encounter: Payer: Self-pay | Admitting: Pain Medicine

## 2015-12-10 DIAGNOSIS — M533 Sacrococcygeal disorders, not elsewhere classified: Secondary | ICD-10-CM | POA: Insufficient documentation

## 2015-12-10 DIAGNOSIS — E876 Hypokalemia: Secondary | ICD-10-CM

## 2015-12-10 DIAGNOSIS — M961 Postlaminectomy syndrome, not elsewhere classified: Secondary | ICD-10-CM

## 2015-12-10 DIAGNOSIS — G894 Chronic pain syndrome: Secondary | ICD-10-CM | POA: Diagnosis not present

## 2015-12-10 DIAGNOSIS — Z8249 Family history of ischemic heart disease and other diseases of the circulatory system: Secondary | ICD-10-CM | POA: Insufficient documentation

## 2015-12-10 DIAGNOSIS — I2510 Atherosclerotic heart disease of native coronary artery without angina pectoris: Secondary | ICD-10-CM | POA: Diagnosis not present

## 2015-12-10 DIAGNOSIS — M1288 Other specific arthropathies, not elsewhere classified, other specified site: Secondary | ICD-10-CM | POA: Diagnosis not present

## 2015-12-10 DIAGNOSIS — M5442 Lumbago with sciatica, left side: Secondary | ICD-10-CM | POA: Insufficient documentation

## 2015-12-10 DIAGNOSIS — F17200 Nicotine dependence, unspecified, uncomplicated: Secondary | ICD-10-CM | POA: Insufficient documentation

## 2015-12-10 DIAGNOSIS — G8929 Other chronic pain: Secondary | ICD-10-CM

## 2015-12-10 DIAGNOSIS — M25559 Pain in unspecified hip: Secondary | ICD-10-CM

## 2015-12-10 DIAGNOSIS — M47816 Spondylosis without myelopathy or radiculopathy, lumbar region: Secondary | ICD-10-CM

## 2015-12-10 DIAGNOSIS — Z7982 Long term (current) use of aspirin: Secondary | ICD-10-CM | POA: Diagnosis not present

## 2015-12-10 DIAGNOSIS — E785 Hyperlipidemia, unspecified: Secondary | ICD-10-CM | POA: Insufficient documentation

## 2015-12-10 DIAGNOSIS — M5441 Lumbago with sciatica, right side: Secondary | ICD-10-CM | POA: Diagnosis not present

## 2015-12-10 DIAGNOSIS — Z8546 Personal history of malignant neoplasm of prostate: Secondary | ICD-10-CM | POA: Insufficient documentation

## 2015-12-10 DIAGNOSIS — F1190 Opioid use, unspecified, uncomplicated: Secondary | ICD-10-CM | POA: Diagnosis not present

## 2015-12-10 DIAGNOSIS — Z79891 Long term (current) use of opiate analgesic: Secondary | ICD-10-CM | POA: Insufficient documentation

## 2015-12-10 DIAGNOSIS — Z9049 Acquired absence of other specified parts of digestive tract: Secondary | ICD-10-CM | POA: Diagnosis not present

## 2015-12-10 DIAGNOSIS — Z981 Arthrodesis status: Secondary | ICD-10-CM | POA: Insufficient documentation

## 2015-12-10 MED ORDER — MAGNESIUM OXIDE -MG SUPPLEMENT 500 MG PO CAPS: 1.0000 | ORAL_CAPSULE | Freq: Two times a day (BID) | ORAL | 99 refills | Status: DC

## 2015-12-10 NOTE — Progress Notes (Signed)
Safety precautions to be maintained throughout the outpatient stay will include: orient to surroundings, keep bed in low position, maintain call bell within reach at all times, provide assistance with transfer out of bed and ambulation.  

## 2015-12-10 NOTE — Progress Notes (Signed)
Patient's Name: Justin Harrington  MRN: [id number]  Referring Provider: [name], MD  DOB: Jun 03, 1945  PCP: [name], MD  DOS: 12/10/2015  Note by: Kathlen Brunswick. Dossie Arbour, MD  Service setting: Ambulatory outpatient  Specialty: Interventional Pain Management  Location: [facility] (AMB) Pain Management Facility    Patient type: Established   Primary Reason(s) for Visit: Encounter for evaluation before starting new chronic pain management plan of care (Level of risk: moderate) CC: Back Pain (lower)  HPI  Justin Harrington is a 70 y.o. year old, male patient, who comes today for a follow-up evaluation to review the test results and decide on a treatment plan. He has AA (aortic aneurysm) ([place]); Carotid arterial disease ([place]); Cataract of left eye; Diverticulosis of colon without hemorrhage; Elevated PSA; History of DVT (deep vein thrombosis); Hyperlipidemia; Insomnia; Prostate cancer ([place]); Arthrodesis status; Tobacco abuse; Hypercholesteremia; Hypertension; Opioid-induced constipation (OIC); Long term current use of opiate analgesic; Long term prescription opiate use; Opiate use (180 MME/Day); Encounter for therapeutic drug level monitoring; Chronic low back pain (Location of Primary Source of Pain) (Bilateral) (R>L); Malignant neoplasm of prostate ([place]); Failed back surgical syndrome; History of lumbar fusion (L4-S1 posterior hardware); Lumbar spondylosis; Lumbar facet syndrome (Bilateral) (R>L); Hypokalemia; Hypomagnesemia; Chronic sacroiliac joint pain (Bilateral) (R>L); Chronic hip pain (Bilateral) (R>L); and Chronic pain syndrome on his problem list. His primarily concern today is the Back Pain (lower)  Pain Assessment: Self-Reported Pain Score: 5 /10             Reported level is compatible with observation.       Pain Location: Back Pain Orientation: Lower Pain Descriptors / Indicators: Constant, Aching Pain Frequency: Constant  Justin Harrington comes in today for a follow-up visit after his initial  evaluation on 10/16/2015. Today we went over the results of his tests. These were explained in "Layman's terms". During today's appointment we went over my diagnostic impression, as well as the proposed treatment plan. The patient appears to have benefited from the [place] and they have Benefiber. He is interested in titrating his opioids down and we plan to start doing this once we get his pain under control with the interventional therapies. Today's physical exam was positive for lateral SI joint pain as well as bilateral hip joint pain. He also has a significant component of bilateral lumbar facet syndrome. Today we will order x-rays of his hips and SI joint and we will have him come back for a diagnostic bilateral sacroiliac joint and facet joint injections. Once we begin to get this under control, we'll move on to his hip joints. In terms of his pain medication, he indicates that he was recently given a refill on his pain medication and therefore he has enough for now. Once he starts going down, the plan will be to switch from the oxycodone IR 30 mg to either they have 5 or 10 mg pills so as to make each drop smaller. I plan to go down on his dose by 5 mg per week. This means that he will take me 24 weeks to completely taper him off of the narcotics. Once we do that, I plan to keep him off of it for at least 2 weeks so as to get rid of the excess receptors and then we will restart it, at which time he will probably require a fraction of what he is currently using. He understands the plan and he is in agreement with it.  In considering the treatment plan options,  Justin Harrington was reminded that I no longer take patients for medication management only. I asked him to let me know if he had no intention of taking advantage of the interventional therapies, so that we could make arrangements to provide this space to someone interested. I also made it clear that undergoing interventional therapies for the purpose of  getting pain medications is very inappropriate on the part of a patient, and it will not be tolerated in this practice. This type of behavior would suggest true addiction and therefore it requires referral to an addiction specialist.   Further details on both, my assessment(s), as well as the proposed treatment plan, please see below. Controlled Substance Pharmacotherapy Assessment REMS (Risk Evaluation and Mitigation Strategy)  Analgesic: Oxycodone IR 30 mg every 6 hours (120 mg/day of oxycodone) MME/day: 180 mg/day Pill Count: None expected due to no prior prescriptions written by our practice. Pharmacokinetics: Liberation and absorption (onset of action): WNL Distribution (time to peak effect): WNL Metabolism and excretion (duration of action): WNL         Pharmacodynamics: Desired effects: Analgesia: The patient reports >50% benefit. Reported improvement in function: The patient reports medication allows him to accomplish basic ADLs. Clinically meaningful improvement in function (CMIF): Sustained CMIF goals met Perceived effectiveness: Described as relatively effective, allowing for increase in activities of daily living (ADL) Undesirable effects: Side-effects or Adverse reactions: None reported Monitoring: [place] PMP: Online review of the past 22-monthperiod previously conducted. Not applicable at this point since we have not taken over the patient's medication management yet. List of all UDS test(s) done:  Lab Results  Component Value Date   SUMMARY FINAL 10/16/2015   Last UDS on record: Summary  Date Value Ref Range Status  10/16/2015 FINAL  Final    Comment:    ==================================================================== TOXASSURE COMP DRUG ANALYSIS,UR ==================================================================== Test                             Result       Flag       Units Drug Present and Declared for Prescription Verification   Oxycodone                       >2571        EXPECTED   ng/mg creat   Oxymorphone                    >2571        EXPECTED   ng/mg creat   Noroxycodone                   >2571        EXPECTED   ng/mg creat   Noroxymorphone                 1061         EXPECTED   ng/mg creat    Sources of oxycodone are scheduled prescription medications.    Oxymorphone, noroxycodone, and noroxymorphone are expected    metabolites of oxycodone. Oxymorphone is also available as a    scheduled prescription medication.   Trazodone                      PRESENT      EXPECTED   1,3 chlorophenyl piperazine    PRESENT      EXPECTED    1,3-chlorophenyl piperazine is an expected metabolite  of    trazodone. Drug Present not Declared for Prescription Verification   Doxylamine                     PRESENT      UNEXPECTED Drug Absent but Declared for Prescription Verification   Salicylate                     Not Detected UNEXPECTED    Aspirin, as indicated in the declared medication list, is not    always detected even when used as directed. ==================================================================== Test                      Result    Flag   Units      Ref Range   Creatinine              389              mg/dL      >=20 ==================================================================== Declared Medications:  The flagging and interpretation on this report are based on the  following declared medications.  Unexpected results may arise from  inaccuracies in the declared medications.  **Note: The testing scope of this panel includes these medications:  Oxycodone (Roxicodone)  Trazodone (Desyrel)  **Note: The testing scope of this panel does not include small to  moderate amounts of these reported medications:  Aspirin  **Note: The testing scope of this panel does not include following  reported medications:  Atorvastatin (Lipitor)  Hydrochlorothiazide (Hydrodiuril)  Naloxone  (Narcan) ==================================================================== For clinical consultation, please call [phone]. ====================================================================    UDS interpretation: No unexpected findings.          Medication Assessment Form: Patient introduced to form today Treatment compliance: Treatment may start today if patient agrees with proposed plan. Evaluation of compliance is not applicable at this point Risk Assessment Profile: Aberrant behavior: See prior evaluations. None observed or detected today Comorbid factors increasing risk of overdose: See prior notes. No additional risks detected today Risk Mitigation Strategies:  Patient opioid safety counseling: Completed today. Counseling provided to patient as per "Patient Counseling Document". Document signed by patient, attesting to counseling and understanding Patient-Prescriber Agreement (PPA): Obtained today  Controlled substance notification to other providers: Written and sent today  Pharmacologic Plan: Today we may be taking over the patient's pharmacological regimen. See below  Laboratory Chemistry  Inflammation Markers Lab Results  Component Value Date   ESRSEDRATE 1 10/16/2015   CRP 2.7 (H) 10/16/2015   Renal Function Lab Results  Component Value Date   BUN 19 10/16/2015   CREATININE 1.19 10/16/2015   GFRAA >60 10/16/2015   GFRNONAA >60 10/16/2015   Hepatic Function Lab Results  Component Value Date   AST 18 10/16/2015   ALT 16 (L) 10/16/2015   ALBUMIN 4.0 10/16/2015   Electrolytes Lab Results  Component Value Date   NA 139 10/16/2015   K 3.2 (L) 10/16/2015   CL 100 (L) 10/16/2015   CALCIUM 9.4 10/16/2015   MG 1.6 (L) 10/16/2015   Pain Modulating Vitamins Lab Results  Component Value Date   25OHVITD1 45 10/16/2015   25OHVITD2 <1.0 10/16/2015   25OHVITD3 44 10/16/2015   VITAMINB12 206 10/16/2015   Coagulation Parameters Lab Results  Component  Value Date   INR 1.0 12/03/2011   LABPROT 13.2 12/03/2011   APTT 28.5 12/03/2011   PLT 173 12/03/2011   Cardiovascular Lab Results  Component Value Date   HGB 14.5  12/03/2011   HCT 42.7 12/03/2011   Note: Lab results reviewed and explained to patient in Layman's terms. The patient was recommended to contact his primary care physician to address his hypokalemia. I will be starting him on magnesium 500 mg twice a day to treat his hypomagnesemia. This is also likely to help him with his constipation and muscle aches.  Recent Diagnostic Imaging Review  Lumbosacral Imaging: Lumbar DG 2-3 views:  Results for orders placed in visit on 08/16/08  DG Lumbar Spine 2-3 Views   Narrative * PRIOR REPORT IMPORTED FROM AN EXTERNAL SYSTEM *   PRIOR REPORT IMPORTED FROM THE [facility] WORKFLOW SYSTEM   REASON FOR EXAM:    back pain  COMMENTS:   PROCEDURE:     KDR - KDXR LUMBAR SPINE AP AND LATERAL  - Aug 16 2008  11:22AM   RESULT:     The lumbar vertebral bodies are preserved in height. The  patient  has undergone posterior fusion. There are metallic screws and plates  present  at the L4 through S1 levels. There is disc space narrowing at L4-L5. There  is facet joint fusion at this level. The other lumbar vertebral bodies  appear preserved in height. There is diffuse osteopenia.   IMPRESSION:      There are degenerative changes of the lower lumbar spine  with evidence of prior posterior fusion.       Lumbar DG (Complete) 4+V:  Results for orders placed during the hospital encounter of 07/01/15  DG Lumbar Spine Complete   Narrative CLINICAL DATA:  Lumbar pain, bilateral pain without sciatica. Degeneration of intervertebral disc of lumbar region.  1998 spinal surgery.  Worsening pain over the past 6 months.  EXAM: LUMBAR SPINE - COMPLETE 4+ VIEW  COMPARISON:  Lumbar spine plain film dated 08/16/2008.  FINDINGS: Posterior fusion hardware appears stable in position at the L4 through S1  levels. Overall osseous alignment of the lumbar spine is stable, with moderate levoscoliosis centered at the L3 level. No acute or suspicious osseous lesion. No fracture line or displaced fracture fragment. Lumbar vertebral bodies are stable in height.  Again noted is the disc desiccation at the L4-5 level, stable in appearance, moderate in degree, with facet joint fusion posteriorly. Milder disc desiccations again noted at the L3-4 and L5-S1 levels, similar or slightly progressed compared to the previous exam.  Atherosclerotic changes are noted along the walls of the infrarenal abdominal aorta. Cholecystectomy clips noted in the right upper quadrant. Paravertebral soft tissues are otherwise unremarkable  IMPRESSION: 1. Lumbar spine appears stable compared to the previous exam of 08/16/2008, perhaps mild progression of the disc desiccations at the L3-4 and L5-S1 levels. 2. Posterior fusion hardware appears intact and stable in alignment at the L4 through S1 levels. 3. No acute findings.   Electronically Signed   By: Franki Cabot M.D.   On: 07/01/2015 14:14    Note: Imaging results reviewed and explained to patient in Layman's terms.  Meds  The patient has a current medication list which includes the following prescription(s): aspirin ec, atorvastatin, hydrochlorothiazide, lubiprostone, magnesium oxide, oxycodone, trazodone, and benefiber.  Current Outpatient Prescriptions on File Prior to Visit  Medication Sig  . aspirin EC 81 MG tablet Take 1 tablet by mouth daily.  Marland Kitchen atorvastatin (LIPITOR) 80 MG tablet Take 1 tablet (80 mg total) by mouth daily.  . hydrochlorothiazide (HYDRODIURIL) 25 MG tablet Take 1 tablet (25 mg total) by mouth daily.  Marland Kitchen lubiprostone (AMITIZA) 24 MCG capsule  Take 1 capsule (24 mcg total) by mouth 2 (two) times daily with a meal. Swallow the medication whole. Do not break or chew the medication.  Marland Kitchen oxycodone (ROXICODONE) 30 MG immediate release tablet One  every four to six hours as needed  . traZODone (DESYREL) 150 MG tablet Take 1 tablet (150 mg total) by mouth at bedtime.  . Wheat Dextrin (BENEFIBER) POWD Stir 2 tsp. TID into 4-8 oz of any non-carbonated beverage or soft food (hot or cold)   No current facility-administered medications on file prior to visit.    ROS  Constitutional: Denies any fever or chills Gastrointestinal: No reported hemesis, hematochezia, vomiting, or acute GI distress Musculoskeletal: Denies any acute onset joint swelling, redness, loss of ROM, or weakness Neurological: No reported episodes of acute onset apraxia, aphasia, dysarthria, agnosia, amnesia, paralysis, loss of coordination, or loss of consciousness  Allergies  Mr. Lax has no active allergies.  [place]  Drug: Mr. Kondracki  reports that he does not use drugs. Alcohol:  reports that he does not drink alcohol. Tobacco:  reports that he has been smoking Cigarettes.  He has a 37.50 pack-year smoking history. He has never used smokeless tobacco. Medical:  has a past medical history of Aortic aneurysm ([place]); CAD (coronary artery disease); DDD (degenerative disc disease), lumbar; Hyperlipidemia; Personal history of tobacco use, presenting hazards to health (06/12/2015); Personal history of tobacco use, presenting hazards to health (06/12/2015); and Prostate cancer ([place]). Family: family history includes Cerebral palsy in his son; Congestive Heart Failure in his father; Heart attack in his mother.  Past Surgical History:  Procedure Laterality Date  . BACK SURGERY  1998   Lumbar spine fusion  . carotid doppler ultrasound  10/05/2011   75-90% RCA occlusion, 50% on left  . CAROTID ENDARTERECTOMY Right 12/02/2011   Dr. Lucky Cowboy  . CHOLECYSTECTOMY  10/02/2010   Laparoscopic, Dr. Pat Patrick, [facility]  . 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 Cholelithiasis and acute cholecystiis. Mild enlargement of left adrenal  gland  . PROSTATE SURGERY  10/19/2012   prostatectomy. [facility]; Laser assisted, Done by [name] for prostate cancer   Constitutional Exam  General appearance: Well nourished, well developed, and well hydrated. In no apparent acute distress Vitals:   12/10/15 1402  BP: (!) 146/66  Pulse: 76  Resp: 16  Temp: 98.8 F (37.1 C)  TempSrc: Oral  SpO2: 97%  Weight: 175 lb (79.4 kg)  Height: '6\' 1"'  (1.854 m)   BMI Assessment: Estimated body mass index is 23.09 kg/m as calculated from the following:   Height as of this encounter: '6\' 1"'  (1.854 m).   Weight as of this encounter: 175 lb (79.4 kg).  BMI interpretation table: BMI level Category Range association with higher incidence of chronic pain  <18 kg/m2 Underweight   18.5-24.9 kg/m2 Ideal body weight   25-29.9 kg/m2 Overweight Increased incidence by 20%  30-34.9 kg/m2 Obese (Class I) Increased incidence by 68%  35-39.9 kg/m2 Severe obesity (Class II) Increased incidence by 136%  >40 kg/m2 Extreme obesity (Class [place]) Increased incidence by 254%   BMI Readings from Last 4 Encounters:  12/10/15 23.09 kg/m  10/16/15 23.09 kg/m  10/08/15 22.85 kg/m  06/13/15 22.47 kg/m   Wt Readings from Last 4 Encounters:  12/10/15 175 lb (79.4 kg)  10/16/15 175 lb (79.4 kg)  10/08/15 178 lb (80.7 kg)  06/13/15 175 lb (79.4 kg)  Psych/Mental status: Alert, oriented  x 3 (person, place, & time) Eyes: PERLA Respiratory: No evidence of acute respiratory distress  Cervical Spine Exam  Inspection: No masses, redness, or swelling Alignment: Symmetrical Functional ROM: Unrestricted ROM Stability: No instability detected Muscle strength & Tone: Functionally intact Sensory: Unimpaired Palpation: Non-contributory  Upper Extremity (UE) Exam    Side: Right upper extremity  Side: Left upper extremity  Inspection: No masses, redness, swelling, or asymmetry  Inspection: No masses, redness, swelling, or asymmetry  Functional ROM:  Unrestricted ROM         Functional ROM: Unrestricted ROM          Muscle strength & Tone: Functionally intact  Muscle strength & Tone: Functionally intact  Sensory: Unimpaired  Sensory: Unimpaired  Palpation: Non-contributory  Palpation: Non-contributory   Thoracic Spine Exam  Inspection: No masses, redness, or swelling Alignment: Symmetrical Functional ROM: Unrestricted ROM Stability: No instability detected Sensory: Unimpaired Muscle strength & Tone: Functionally intact Palpation: Non-contributory  Lumbar Spine Exam  Inspection: Well healed scar from previous spine surgery detected Alignment: Symmetrical Functional ROM: Decreased ROM Stability: No instability detected Muscle strength & Tone: Functionally intact Sensory: Movement-associated pain Palpation: Complains of area being tender to palpation Provocative Tests: Lumbar Hyperextension and rotation test: Positive bilaterally for facet joint pain. Patrick's Maneuver: Positive for bilateral S-I joint pain and for bilateral hip joint pain.  Gait & Posture Assessment  Ambulation: Patient ambulates using a cane Gait: Limited. Using assistive device to ambulate Posture: Antalgic   Lower Extremity Exam    Side: Right lower extremity  Side: Left lower extremity  Inspection: No masses, redness, swelling, or asymmetry  Inspection: No masses, redness, swelling, or asymmetry  Functional ROM: Unrestricted ROM          Functional ROM: Unrestricted ROM          Muscle strength & Tone: Functionally intact  Muscle strength & Tone: Functionally intact  Sensory: Unimpaired  Sensory: Unimpaired  Palpation: Non-contributory  Palpation: Non-contributory   Assessment & Plan  Primary Diagnosis & Pertinent Problem List: The primary encounter diagnosis was Hypomagnesemia. Diagnoses of Hypokalemia, Long term current use of opiate analgesic, Opiate use (180 MME/Day), Chronic low back pain (Location of Primary Source of Pain) (Bilateral) (R>L),  Failed back surgical syndrome, Lumbar facet syndrome (Bilateral) (R>L), Chronic sacroiliac joint pain, Hip pain, chronic, unspecified laterality, and Chronic pain syndrome were also pertinent to this visit.  Visit Diagnosis: 1. Hypomagnesemia   2. Hypokalemia   3. Long term current use of opiate analgesic   4. Opiate use (180 MME/Day)   5. Chronic low back pain (Location of Primary Source of Pain) (Bilateral) (R>L)   6. Failed back surgical syndrome   7. Lumbar facet syndrome (Bilateral) (R>L)   8. Chronic sacroiliac joint pain   9. Hip pain, chronic, unspecified laterality   10. Chronic pain syndrome    Problems updated and reviewed during this visit: Problem  Chronic sacroiliac joint pain (Bilateral) (R>L)  Chronic hip pain (Bilateral) (R>L)  Chronic Pain Syndrome  Chronic low back pain (Location of Primary Source of Pain) (Bilateral) (R>L)  Failed Back Surgical Syndrome   L4-S1 posterior fusion hardware.   History of lumbar fusion (L4-S1 posterior hardware)  Prostate cancer ([place])   Followed by Dr Jacqlyn Larsen. s/p total prostatectomy [facility] 2014   Hypokalemia  Hypomagnesemia   Problem-specific Plan(s): No problem-specific Assessment & Plan notes found for this encounter.  No new Assessment & Plan notes have been filed under this hospital service since the last note  was generated. Service: Pain Management  Plan of Care   Problem List Items Addressed This Visit      High   Chronic hip pain (Bilateral) (R>L) (Chronic)   Relevant Orders   DG HIP UNILAT W OR W/O PELVIS 2-3 VIEWS LEFT   DG HIP UNILAT W OR W/O PELVIS 2-3 VIEWS RIGHT   Chronic low back pain (Location of Primary Source of Pain) (Bilateral) (R>L) (Chronic)   Chronic pain syndrome (Chronic)   Chronic sacroiliac joint pain (Bilateral) (R>L) (Chronic)   Relevant Orders   SACROILIAC JOINT INJECTINS   DG Si Joints   Failed back surgical syndrome (Chronic)   Lumbar facet syndrome (Bilateral) (R>L) (Chronic)   Relevant  Orders   LUMBAR FACET(MEDIAL BRANCH NERVE BLOCK) MBNB     Medium   Long term current use of opiate analgesic (Chronic)   Opiate use (180 MME/Day) (Chronic)     Low   Hypokalemia   Hypomagnesemia - Primary     Pharmacotherapy (Medications Ordered): Meds ordered this encounter  Medications  . Magnesium Oxide 500 MG CAPS    Sig: Take 1 capsule (500 mg total) by mouth 2 (two) times daily at 8 am and 10 pm.    Dispense:  100 capsule    Refill:  PRN    Do not place this medication, or any other prescription from our practice, on "Automatic Refill". Patient may have prescription filled one day early if pharmacy is closed on scheduled refill date.   New Prescriptions   MAGNESIUM OXIDE 500 MG CAPS    Take 1 capsule (500 mg total) by mouth 2 (two) times daily at 8 am and 10 pm.   Medications administered during this visit: Mr. Hendriks had no medications administered during this visit. Lab-work, procedure(s), and/or referral(s): Orders Placed This Encounter  Procedures  . LUMBAR FACET(MEDIAL BRANCH NERVE BLOCK) MBNB  . SACROILIAC JOINT INJECTINS  . DG HIP UNILAT W OR W/O PELVIS 2-3 VIEWS LEFT  . DG HIP UNILAT W OR W/O PELVIS 2-3 VIEWS RIGHT  . DG Si Joints   Imaging and/or referral(s): DG HIP UNILAT W OR W/O PELVIS 2-3 VIEWS LEFT DG HIP UNILAT W OR W/O PELVIS 2-3 VIEWS RIGHT DG SI JOINTS  Pharmacotherapy: Plan:  Since he was just given a prescription for his opioid, I will not be writing for one today. However, in the future we will be taking over his opioid management with the goal of tapering the medication down in order to stop it for at least 2 weeks to accomplish a "Drug Holiday". In addition to this, we will optimize the use of adjuvants such as membrane stabilizers and over-the-counter medications. Today we will be starting the patient on magnesium 500 mg by mouth twice a day. We will also continue him on the Amitiza and the Benefiber for the opioid-induced constipation.     Interventional therapies: Planned, scheduled, and/or pending:    Diagnostic bilateral  Lumbar facet block and sacroiliac joint block under fluoroscopic guidance and IV sedation.    Considering:   Diagnostic bilateral lumbar facet block under fluoroscopic guidance and IV sedation.  Possible bilateral lumbar facet radiofrequency ablation under fluoroscopic guidance and IV sedation.  Diagnostic bilateral sacroiliac joint injection under fluoroscopic guidance, with or without sedation. Possible bilateral sacroiliac joint radiofrequency ablation. Diagnostic bilateral intra-articular hip joint injection under fluoroscopic guidance, with or without sedation. Possible bilateral hip joint radiofrequency ablation. Caudal epidural steroid injection under fluoroscopic guidance, with or without sedation + epidurogram.  Possible Racz procedure.    PRN Procedures:   None at this time.    Provider-requested follow-up: Return for procedure, (ASAP).  Future Appointments Date Time Provider [place]  12/17/2015 8:00 AM [facility] 2 [facility] None  12/17/2015 8:45 AM [facility] 2 [facility] None  12/17/2015 9:30 AM Sela Hua, PA-C [facility] None    Primary Care Physician: [name], MD Location: [facility] Note by: Kathlen Brunswick. Dossie Arbour, M.D, DABA, DABAPM, DABPM, DABIPP, FIPP  Pain Score Disclaimer: We use the NRS-11 scale. This is a self-reported, subjective measurement of pain severity with only modest accuracy. It is used primarily to identify changes within a particular patient. It must be understood that outpatient pain scales are significantly less accurate that those used for research, where they can be applied under ideal controlled circumstances with minimal exposure to variables. In reality, the score is likely to be a combination of pain intensity and pain affect, where pain affect describes the degree of emotional arousal or changes in action  readiness caused by the sensory experience of pain. Factors such as social and work situation, setting, emotional state, anxiety levels, expectation, and prior pain experience may influence pain perception and show large inter-individual differences that may also be affected by time variables.  Patient instructions provided during this appointment: Patient Instructions   GENERAL RISKS AND COMPLICATIONS  What are the risk, side effects and possible complications? Generally speaking, most procedures are safe.  However, with any procedure there are risks, side effects, and the possibility of complications.  The risks and complications are dependent upon the sites that are lesioned, or the type of nerve block to be performed.  The closer the procedure is to the spine, the more serious the risks are.  Great care is taken when placing the radio frequency needles, block needles or lesioning probes, but sometimes complications can occur. 1. Infection: Any time there is an injection through the skin, there is a risk of infection.  This is why sterile conditions are used for these blocks.  There are four possible types of infection. 1. Localized skin infection. 2. Central Nervous System Infection-This can be in the form of Meningitis, which can be deadly. 3. Epidural Infections-This can be in the form of an epidural abscess, which can cause pressure inside of the spine, causing compression of the spinal cord with subsequent paralysis. This would require an emergency surgery to decompress, and there are no guarantees that the patient would recover from the paralysis. 4. Discitis-This is an infection of the intervertebral discs.  It occurs in about 1% of discography procedures.  It is difficult to treat and it may lead to surgery.        2. Pain: the needles have to go through skin and soft tissues, will cause soreness.       3. Damage to internal structures:  The nerves to be lesioned may be near blood vessels  or    other nerves which can be potentially damaged.       4. Bleeding: Bleeding is more common if the patient is taking blood thinners such as  aspirin, Coumadin, Ticiid, Plavix, etc., or if he/she have some genetic predisposition  such as hemophilia. Bleeding into the spinal canal can cause compression of the spinal  cord with subsequent paralysis.  This would require an emergency surgery to  decompress and there are no guarantees that the patient would recover from the  paralysis.  5. Pneumothorax:  Puncturing of a lung is a possibility, every time a needle is introduced in  the area of the chest or upper back.  Pneumothorax refers to free air around the  collapsed lung(s), inside of the thoracic cavity (chest cavity).  Another two possible  complications related to a similar event would include: Hemothorax and Chylothorax.   These are variations of the Pneumothorax, where instead of air around the collapsed  lung(s), you may have blood or chyle, respectively.       6. Spinal headaches: They may occur with any procedures in the area of the spine.       7. Persistent CSF (Cerebro-Spinal Fluid) leakage: This is a rare problem, but may occur  with prolonged intrathecal or epidural catheters either due to the formation of a fistulous  track or a dural tear.       8. Nerve damage: By working so close to the spinal cord, there is always a possibility of  nerve damage, which could be as serious as a permanent spinal cord injury with  paralysis.       9. Death:  Although rare, severe deadly allergic reactions known as "Anaphylactic  reaction" can occur to any of the medications used.      10. Worsening of the symptoms:  We can always make thing worse.  What are the chances of something like this happening? Chances of any of this occuring are extremely low.  By statistics, you have more of a chance of getting killed in a motor vehicle accident: while driving to the hospital than any of the above occurring  .  Nevertheless, you should be aware that they are possibilities.  In general, it is similar to taking a shower.  Everybody knows that you can slip, hit your head and get killed.  Does that mean that you should not shower again?  Nevertheless always keep in mind that statistics do not mean anything if you happen to be on the wrong side of them.  Even if a procedure has a 1 (one) in a 1,000,000 (million) chance of going wrong, it you happen to be that one..Also, keep in mind that by statistics, you have more of a chance of having something go wrong when taking medications.  Who should not have this procedure? If you are on a blood thinning medication (e.g. Coumadin, Plavix, see list of "Blood Thinners"), or if you have an active infection going on, you should not have the procedure.  If you are taking any blood thinners, please inform your physician.  How should I prepare for this procedure?  Do not eat or drink anything at least six hours prior to the procedure.  Bring a driver with you .  It cannot be a taxi.  Come accompanied by an adult that can drive you back, and that is strong enough to help you if your legs get weak or numb from the local anesthetic.  Take all of your medicines the morning of the procedure with just enough water to swallow them.  If you have diabetes, make sure that you are scheduled to have your procedure done first thing in the morning, whenever possible.  If you have diabetes, take only half of your insulin dose and notify our nurse that you have done so as soon as you arrive at the clinic.  If you are diabetic, but only take blood sugar pills (oral hypoglycemic), then do not take them on the morning of your procedure.  You may take them after you have had the procedure.  Do not take aspirin or any aspirin-containing medications, at least eleven (11) days prior to the procedure.  They may prolong bleeding.  Wear loose fitting clothing that may be easy to take off and  that you would not mind if it got stained with Betadine or blood.  Do not wear any jewelry or perfume  Remove any nail coloring.  It will interfere with some of our monitoring equipment.  NOTE: Remember that this is not meant to be interpreted as a complete list of all possible complications.  Unforeseen problems may occur.  BLOOD THINNERS The following drugs contain aspirin or other products, which can cause increased bleeding during surgery and should not be taken for 2 weeks prior to and 1 week after surgery.  If you should need take something for relief of minor pain, you may take acetaminophen which is found in Tylenol,m Datril, Anacin-3 and Panadol. It is not blood thinner. The products listed below are.  Do not take any of the products listed below in addition to any listed on your instruction sheet.  A.P.C or A.P.C with Codeine Codeine Phosphate Capsules #3 Ibuprofen Ridaura  ABC compound Congesprin Imuran rimadil  Advil Cope Indocin Robaxisal  Alka-Seltzer Effervescent Pain Reliever and Antacid Coricidin or Coricidin-D  Indomethacin Rufen  Alka-Seltzer plus Cold Medicine Cosprin Ketoprofen S-A-C Tablets  Anacin Analgesic Tablets or Capsules Coumadin Korlgesic Salflex  Anacin Extra Strength Analgesic tablets or capsules CP-2 Tablets Lanoril Salicylate  Anaprox Cuprimine Capsules Levenox Salocol  Anexsia-D Dalteparin Magan Salsalate  Anodynos Darvon compound Magnesium Salicylate Sine-off  Ansaid Dasin Capsules Magsal Sodium Salicylate  Anturane Depen Capsules Marnal Soma  APF Arthritis pain formula Dewitt's Pills Measurin Stanback  Argesic Dia-Gesic Meclofenamic Sulfinpyrazone  Arthritis Bayer Timed Release Aspirin Diclofenac Meclomen Sulindac  Arthritis pain formula Anacin Dicumarol Medipren Supac  Analgesic (Safety coated) Arthralgen Diffunasal Mefanamic Suprofen  Arthritis Strength Bufferin Dihydrocodeine Mepro Compound Suprol  Arthropan liquid Dopirydamole Methcarbomol with  Aspirin Synalgos  ASA tablets/Enseals Disalcid Micrainin Tagament  Ascriptin Doan's Midol Talwin  Ascriptin A/D Dolene Mobidin Tanderil  Ascriptin Extra Strength Dolobid Moblgesic Ticlid  Ascriptin with Codeine Doloprin or Doloprin with Codeine Momentum Tolectin  Asperbuf Duoprin Mono-gesic Trendar  Aspergum Duradyne Motrin or Motrin IB Triminicin  Aspirin plain, buffered or enteric coated Durasal Myochrisine Trigesic  Aspirin Suppositories Easprin Nalfon Trillsate  Aspirin with Codeine Ecotrin Regular or Extra Strength Naprosyn Uracel  Atromid-S Efficin Naproxen Ursinus  Auranofin Capsules Elmiron Neocylate Vanquish  Axotal Emagrin Norgesic Verin  Azathioprine Empirin or Empirin with Codeine Normiflo Vitamin E  Azolid Emprazil Nuprin Voltaren  Bayer Aspirin plain, buffered or children's or timed BC Tablets or powders Encaprin Orgaran Warfarin Sodium  Buff-a-Comp Enoxaparin Orudis Zorpin  Buff-a-Comp with Codeine Equegesic Os-Cal-Gesic   Buffaprin Excedrin plain, buffered or Extra Strength Oxalid   Bufferin Arthritis Strength Feldene Oxphenbutazone   Bufferin plain or Extra Strength Feldene Capsules Oxycodone with Aspirin   Bufferin with Codeine Fenoprofen Fenoprofen Pabalate or Pabalate-SF   Buffets II Flogesic Panagesic   Buffinol plain or Extra Strength Florinal or Florinal with Codeine Panwarfarin   Buf-Tabs Flurbiprofen Penicillamine   Butalbital Compound Four-way cold tablets Penicillin   Butazolidin Fragmin Pepto-Bismol   Carbenicillin Geminisyn Percodan   Carna Arthritis Reliever Geopen Persantine   Carprofen Gold's salt Persistin   Chloramphenicol Goody's Phenylbutazone   Chloromycetin Haltrain Piroxlcam   Clmetidine heparin Plaquenil   Cllnoril Hyco-pap Ponstel   Clofibrate Hydroxy chloroquine  Propoxyphen         Before stopping any of these medications, be sure to consult the physician who ordered them.  Some, such as Coumadin (Warfarin) are ordered to prevent or  treat serious conditions such as "deep thrombosis", "pumonary embolisms", and other heart problems.  The amount of time that you may need off of the medication may also vary with the medication and the reason for which you were taking it.  If you are taking any of these medications, please make sure you notify your pain physician before you undergo any procedures.         Sacroiliac (SI) Joint Injection Patient Information  Description: The sacroiliac joint connects the scrum (very low back and tailbone) to the ilium (a pelvic bone which also forms half of the hip joint).  Normally this joint experiences very little motion.  When this joint becomes inflamed or unstable low back and or hip and pelvis pain may result.  Injection of this joint with local anesthetics (numbing medicines) and steroids can provide diagnostic information and reduce pain.  This injection is performed with the aid of x-ray guidance into the tailbone area while you are lying on your stomach.   You may experience an electrical sensation down the leg while this is being done.  You may also experience numbness.  We also may ask if we are reproducing your normal pain during the injection.  Conditions which may be treated SI injection:   Low back, buttock, hip or leg pain  Preparation for the Injection:  1. Do not eat any solid food or dairy products within 8 hours of your appointment.  2. You may drink clear liquids up to 3 hours before appointment.  Clear liquids include water, black coffee, juice or soda.  No milk or cream please. 3. You may take your regular medications, including pain medications with a sip of water before your appointment.  Diabetics should hold regular insulin (if take separately) and take 1/2 normal NPH dose the morning of the procedure.  Carry some sugar containing items with you to your appointment. 4. A driver must accompany you and be prepared to drive you home after your procedure. 5. Bring  all of your current medications with you. 6. An IV may be inserted and sedation may be given at the discretion of the physician. 7. A blood pressure cuff, EKG and other monitors will often be applied during the procedure.  Some patients may need to have extra oxygen administered for a short period.  8. You will be asked to provide medical information, including your allergies, prior to the procedure.  We must know immediately if you are taking blood thinners (like Coumadin/Warfarin) or if you are allergic to IV iodine contrast (dye).  We must know if you could possible be pregnant.  Possible side effects:   Bleeding from needle site  Infection (rare, may require surgery)  Nerve injury (rare)  Numbness & tingling (temporary)  A brief convulsion or seizure  Light-headedness (temporary)  Pain at injection site (several days)  Decreased blood pressure (temporary)  Weakness in the leg (temporary)   Call if you experience:   New onset weakness or numbness of an extremity below the injection site that last more than 8 hours.  Hives or difficulty breathing ( go to the emergency room)  Inflammation or drainage at the injection site  Any new symptoms which are concerning to you  Please note:  Although the local anesthetic injected can  often make your back/ hip/ buttock/ leg feel good for several hours after the injections, the pain will likely return.  It takes 3-7 days for steroids to work in the sacroiliac area.  You may not notice any pain relief for at least that one week.  If effective, we will often do a series of three injections spaced 3-6 weeks apart to maximally decrease your pain.  After the initial series, we generally will wait some months before a repeat injection of the same type.  If you have any questions, please call [phone] [facility]  Facet Joint Block The facet joints connect the bones of the spine (vertebrae). They  make it possible for you to bend, twist, and make other movements with your spine. They also prevent you from overbending, overtwisting, and making other excessive movements.  A facet joint block is a procedure where a numbing medicine (anesthetic) is injected into a facet joint. Often, a type of anti-inflammatory medicine called a steroid is also injected. A facet joint block may be done for two reasons:   Diagnosis. A facet joint block may be done as a test to see whether neck or back pain is caused by a worn-down or infected facet joint. If the pain gets better after a facet joint block, it means the pain is probably coming from the facet joint. If the pain does not get better, it means the pain is probably not coming from the facet joint.   Therapy. A facet joint block may be done to relieve neck or back pain caused by a facet joint. A facet joint block is only done as a therapy if the pain does not improve with medicine, exercise programs, physical therapy, and other forms of pain management. LET [facility] CARE PROVIDER KNOW ABOUT:   Any allergies you have.   All medicines you are taking, including vitamins, herbs, eyedrops, and over-the-counter medicines and creams.   Previous problems you or members of your family have had with the use of anesthetics.   Any blood disorders you have had.   Other health problems you have. RISKS AND COMPLICATIONS Generally, having a facet joint block is safe. However, as with any procedure, complications can occur. Possible complications associated with having a facet joint block include:   Bleeding.   Injury to a nerve near the injection site.   Pain at the injection site.   Weakness or numbness in areas controlled by nerves near the injection site.   Infection.   Temporary fluid retention.   Allergic reaction to anesthetics or medicines used during the procedure. BEFORE THE PROCEDURE   Follow your health care provider's  instructions if you are taking dietary supplements or medicines. You may need to stop taking them or reduce your dosage.   Do not take any new dietary supplements or medicines without asking your health care provider first.   Follow your health care provider's instructions about eating and drinking before the procedure. You may need to stop eating and drinking several hours before the procedure.   Arrange to have an adult drive you home after the procedure. PROCEDURE  You may need to remove your clothing and dress in an open-back gown so that your health care provider can access your spine.   The procedure will be done while you are lying on an X-ray table. Most of the time you will be asked to lie on your stomach, but you may be asked to lie in  a different position if an injection will be made in your neck.   Special machines will be used to monitor your oxygen levels, heart rate, and blood pressure.   If an injection will be made in your neck, an intravenous (IV) tube will be inserted into one of your veins. Fluids and medicine will flow directly into your body through the IV tube.   The area over the facet joint where the injection will be made will be cleaned with an antiseptic soap. The surrounding skin will be covered with sterile drapes.   An anesthetic will be applied to your skin to make the injection area numb. You may feel a temporary stinging or burning sensation.   A video X-ray machine will be used to locate the joint. A contrast dye may be injected into the facet joint area to help with locating the joint.   When the joint is located, an anesthetic medicine will be injected into the joint through the needle.   Your health care provider will ask you whether you feel pain relief. If you do feel relief, a steroid may be injected to provide pain relief for a longer period of time. If you do not feel relief or feel only partial relief, additional injections of an  anesthetic may be made in other facet joints.   The needle will be removed, the skin will be cleansed, and bandages will be applied.  AFTER THE PROCEDURE   You will be observed for 15-30 minutes before being allowed to go home. Do not drive. Have an adult drive you or take a taxi or public transportation instead.   If you feel pain relief, the pain will return in several hours or days when the anesthetic wears off.   You may feel pain relief 2-14 days after the procedure. The amount of time this relief lasts varies from person to person.   It is normal to feel some tenderness over the injected area(s) for 2 days following the procedure.   If you have diabetes, you may have a temporary increase in blood sugar.   This information is not intended to replace advice given to you by your health care provider. Make sure you discuss any questions you have with your health care provider.   Document Released: 06/09/2006 Document Revised: 02/08/2014 Document Reviewed: 11/08/2011 Elsevier Interactive Patient Education [employer].

## 2015-12-10 NOTE — Patient Instructions (Signed)
GENERAL RISKS AND COMPLICATIONS  What are the risk, side effects and possible complications? Generally speaking, most procedures are safe.  However, with any procedure there are risks, side effects, and the possibility of complications.  The risks and complications are dependent upon the sites that are lesioned, or the type of nerve block to be performed.  The closer the procedure is to the spine, the more serious the risks are.  Great care is taken when placing the radio frequency needles, block needles or lesioning probes, but sometimes complications can occur. 1. Infection: Any time there is an injection through the skin, there is a risk of infection.  This is why sterile conditions are used for these blocks.  There are four possible types of infection. 1. Localized skin infection. 2. Central Nervous System Infection-This can be in the form of Meningitis, which can be deadly. 3. Epidural Infections-This can be in the form of an epidural abscess, which can cause pressure inside of the spine, causing compression of the spinal cord with subsequent paralysis. This would require an emergency surgery to decompress, and there are no guarantees that the patient would recover from the paralysis. 4. Discitis-This is an infection of the intervertebral discs.  It occurs in about 1% of discography procedures.  It is difficult to treat and it may lead to surgery.        2. Pain: the needles have to go through skin and soft tissues, will cause soreness.       3. Damage to internal structures:  The nerves to be lesioned may be near blood vessels or    other nerves which can be potentially damaged.       4. Bleeding: Bleeding is more common if the patient is taking blood thinners such as  aspirin, Coumadin, Ticiid, Plavix, etc., or if he/she have some genetic predisposition  such as hemophilia. Bleeding into the spinal canal can cause compression of the spinal  cord with subsequent paralysis.  This would require an  emergency surgery to  decompress and there are no guarantees that the patient would recover from the  paralysis.       5. Pneumothorax:  Puncturing of a lung is a possibility, every time a needle is introduced in  the area of the chest or upper back.  Pneumothorax refers to free air around the  collapsed lung(s), inside of the thoracic cavity (chest cavity).  Another two possible  complications related to a similar event would include: Hemothorax and Chylothorax.   These are variations of the Pneumothorax, where instead of air around the collapsed  lung(s), you may have blood or chyle, respectively.       6. Spinal headaches: They may occur with any procedures in the area of the spine.       7. Persistent CSF (Cerebro-Spinal Fluid) leakage: This is a rare problem, but may occur  with prolonged intrathecal or epidural catheters either due to the formation of a fistulous  track or a dural tear.       8. Nerve damage: By working so close to the spinal cord, there is always a possibility of  nerve damage, which could be as serious as a permanent spinal cord injury with  paralysis.       9. Death:  Although rare, severe deadly allergic reactions known as "Anaphylactic  reaction" can occur to any of the medications used.      10. Worsening of the symptoms:  We can always make thing worse.    What are the chances of something like this happening? Chances of any of this occuring are extremely low.  By statistics, you have more of a chance of getting killed in a motor vehicle accident: while driving to the hospital than any of the above occurring .  Nevertheless, you should be aware that they are possibilities.  In general, it is similar to taking a shower.  Everybody knows that you can slip, hit your head and get killed.  Does that mean that you should not shower again?  Nevertheless always keep in mind that statistics do not mean anything if you happen to be on the wrong side of them.  Even if a procedure has a 1  (one) in a 1,000,000 (million) chance of going wrong, it you happen to be that one..Also, keep in mind that by statistics, you have more of a chance of having something go wrong when taking medications.  Who should not have this procedure? If you are on a blood thinning medication (e.g. Coumadin, Plavix, see list of "Blood Thinners"), or if you have an active infection going on, you should not have the procedure.  If you are taking any blood thinners, please inform your physician.  How should I prepare for this procedure?  Do not eat or drink anything at least six hours prior to the procedure.  Bring a driver with you .  It cannot be a taxi.  Come accompanied by an adult that can drive you back, and that is strong enough to help you if your legs get weak or numb from the local anesthetic.  Take all of your medicines the morning of the procedure with just enough water to swallow them.  If you have diabetes, make sure that you are scheduled to have your procedure done first thing in the morning, whenever possible.  If you have diabetes, take only half of your insulin dose and notify our nurse that you have done so as soon as you arrive at the clinic.  If you are diabetic, but only take blood sugar pills (oral hypoglycemic), then do not take them on the morning of your procedure.  You may take them after you have had the procedure.  Do not take aspirin or any aspirin-containing medications, at least eleven (11) days prior to the procedure.  They may prolong bleeding.  Wear loose fitting clothing that may be easy to take off and that you would not mind if it got stained with Betadine or blood.  Do not wear any jewelry or perfume  Remove any nail coloring.  It will interfere with some of our monitoring equipment.  NOTE: Remember that this is not meant to be interpreted as a complete list of all possible complications.  Unforeseen problems may occur.  BLOOD THINNERS The following drugs  contain aspirin or other products, which can cause increased bleeding during surgery and should not be taken for 2 weeks prior to and 1 week after surgery.  If you should need take something for relief of minor pain, you may take acetaminophen which is found in Tylenol,m Datril, Anacin-3 and Panadol. It is not blood thinner. The products listed below are.  Do not take any of the products listed below in addition to any listed on your instruction sheet.  A.P.C or A.P.C with Codeine Codeine Phosphate Capsules #3 Ibuprofen Ridaura  ABC compound Congesprin Imuran rimadil  Advil Cope Indocin Robaxisal  Alka-Seltzer Effervescent Pain Reliever and Antacid Coricidin or Coricidin-D  Indomethacin Rufen    Alka-Seltzer plus Cold Medicine Cosprin Ketoprofen S-A-C Tablets  Anacin Analgesic Tablets or Capsules Coumadin Korlgesic Salflex  Anacin Extra Strength Analgesic tablets or capsules CP-2 Tablets Lanoril Salicylate  Anaprox Cuprimine Capsules Levenox Salocol  Anexsia-D Dalteparin Magan Salsalate  Anodynos Darvon compound Magnesium Salicylate Sine-off  Ansaid Dasin Capsules Magsal Sodium Salicylate  Anturane Depen Capsules Marnal Soma  APF Arthritis pain formula Dewitt's Pills Measurin Stanback  Argesic Dia-Gesic Meclofenamic Sulfinpyrazone  Arthritis Bayer Timed Release Aspirin Diclofenac Meclomen Sulindac  Arthritis pain formula Anacin Dicumarol Medipren Supac  Analgesic (Safety coated) Arthralgen Diffunasal Mefanamic Suprofen  Arthritis Strength Bufferin Dihydrocodeine Mepro Compound Suprol  Arthropan liquid Dopirydamole Methcarbomol with Aspirin Synalgos  ASA tablets/Enseals Disalcid Micrainin Tagament  Ascriptin Doan's Midol Talwin  Ascriptin A/D Dolene Mobidin Tanderil  Ascriptin Extra Strength Dolobid Moblgesic Ticlid  Ascriptin with Codeine Doloprin or Doloprin with Codeine Momentum Tolectin  Asperbuf Duoprin Mono-gesic Trendar  Aspergum Duradyne Motrin or Motrin IB Triminicin  Aspirin  plain, buffered or enteric coated Durasal Myochrisine Trigesic  Aspirin Suppositories Easprin Nalfon Trillsate  Aspirin with Codeine Ecotrin Regular or Extra Strength Naprosyn Uracel  Atromid-S Efficin Naproxen Ursinus  Auranofin Capsules Elmiron Neocylate Vanquish  Axotal Emagrin Norgesic Verin  Azathioprine Empirin or Empirin with Codeine Normiflo Vitamin E  Azolid Emprazil Nuprin Voltaren  Bayer Aspirin plain, buffered or children's or timed BC Tablets or powders Encaprin Orgaran Warfarin Sodium  Buff-a-Comp Enoxaparin Orudis Zorpin  Buff-a-Comp with Codeine Equegesic Os-Cal-Gesic   Buffaprin Excedrin plain, buffered or Extra Strength Oxalid   Bufferin Arthritis Strength Feldene Oxphenbutazone   Bufferin plain or Extra Strength Feldene Capsules Oxycodone with Aspirin   Bufferin with Codeine Fenoprofen Fenoprofen Pabalate or Pabalate-SF   Buffets II Flogesic Panagesic   Buffinol plain or Extra Strength Florinal or Florinal with Codeine Panwarfarin   Buf-Tabs Flurbiprofen Penicillamine   Butalbital Compound Four-way cold tablets Penicillin   Butazolidin Fragmin Pepto-Bismol   Carbenicillin Geminisyn Percodan   Carna Arthritis Reliever Geopen Persantine   Carprofen Gold's salt Persistin   Chloramphenicol Goody's Phenylbutazone   Chloromycetin Haltrain Piroxlcam   Clmetidine heparin Plaquenil   Cllnoril Hyco-pap Ponstel   Clofibrate Hydroxy chloroquine Propoxyphen         Before stopping any of these medications, be sure to consult the physician who ordered them.  Some, such as Coumadin (Warfarin) are ordered to prevent or treat serious conditions such as "deep thrombosis", "pumonary embolisms", and other heart problems.  The amount of time that you may need off of the medication may also vary with the medication and the reason for which you were taking it.  If you are taking any of these medications, please make sure you notify your pain physician before you undergo any  procedures.         Sacroiliac (SI) Joint Injection Patient Information  Description: The sacroiliac joint connects the scrum (very low back and tailbone) to the ilium (a pelvic bone which also forms half of the hip joint).  Normally this joint experiences very little motion.  When this joint becomes inflamed or unstable low back and or hip and pelvis pain may result.  Injection of this joint with local anesthetics (numbing medicines) and steroids can provide diagnostic information and reduce pain.  This injection is performed with the aid of x-ray guidance into the tailbone area while you are lying on your stomach.   You may experience an electrical sensation down the leg while this is being done.  You may also experience numbness.  We   also may ask if we are reproducing your normal pain during the injection.  Conditions which may be treated SI injection:   Low back, buttock, hip or leg pain  Preparation for the Injection:  1. Do not eat any solid food or dairy products within 8 hours of your appointment.  2. You may drink clear liquids up to 3 hours before appointment.  Clear liquids include water, black coffee, juice or soda.  No milk or cream please. 3. You may take your regular medications, including pain medications with a sip of water before your appointment.  Diabetics should hold regular insulin (if take separately) and take 1/2 normal NPH dose the morning of the procedure.  Carry some sugar containing items with you to your appointment. 4. A driver must accompany you and be prepared to drive you home after your procedure. 5. Bring all of your current medications with you. 6. An IV may be inserted and sedation may be given at the discretion of the physician. 7. A blood pressure cuff, EKG and other monitors will often be applied during the procedure.  Some patients may need to have extra oxygen administered for a short period.  8. You will be asked to provide medical information,  including your allergies, prior to the procedure.  We must know immediately if you are taking blood thinners (like Coumadin/Warfarin) or if you are allergic to IV iodine contrast (dye).  We must know if you could possible be pregnant.  Possible side effects:   Bleeding from needle site  Infection (rare, may require surgery)  Nerve injury (rare)  Numbness & tingling (temporary)  A brief convulsion or seizure  Light-headedness (temporary)  Pain at injection site (several days)  Decreased blood pressure (temporary)  Weakness in the leg (temporary)   Call if you experience:   New onset weakness or numbness of an extremity below the injection site that last more than 8 hours.  Hives or difficulty breathing ( go to the emergency room)  Inflammation or drainage at the injection site  Any new symptoms which are concerning to you  Please note:  Although the local anesthetic injected can often make your back/ hip/ buttock/ leg feel good for several hours after the injections, the pain will likely return.  It takes 3-7 days for steroids to work in the sacroiliac area.  You may not notice any pain relief for at least that one week.  If effective, we will often do a series of three injections spaced 3-6 weeks apart to maximally decrease your pain.  After the initial series, we generally will wait some months before a repeat injection of the same type.  If you have any questions, please call [phone] [facility]  Facet Joint Block The facet joints connect the bones of the spine (vertebrae). They make it possible for you to bend, twist, and make other movements with your spine. They also prevent you from overbending, overtwisting, and making other excessive movements.  A facet joint block is a procedure where a numbing medicine (anesthetic) is injected into a facet joint. Often, a type of anti-inflammatory medicine called a steroid is also  injected. A facet joint block may be done for two reasons:   Diagnosis. A facet joint block may be done as a test to see whether neck or back pain is caused by a worn-down or infected facet joint. If the pain gets better after a facet joint block, it means the pain is  probably coming from the facet joint. If the pain does not get better, it means the pain is probably not coming from the facet joint.   Therapy. A facet joint block may be done to relieve neck or back pain caused by a facet joint. A facet joint block is only done as a therapy if the pain does not improve with medicine, exercise programs, physical therapy, and other forms of pain management. LET [facility] CARE PROVIDER KNOW ABOUT:   Any allergies you have.   All medicines you are taking, including vitamins, herbs, eyedrops, and over-the-counter medicines and creams.   Previous problems you or members of your family have had with the use of anesthetics.   Any blood disorders you have had.   Other health problems you have. RISKS AND COMPLICATIONS Generally, having a facet joint block is safe. However, as with any procedure, complications can occur. Possible complications associated with having a facet joint block include:   Bleeding.   Injury to a nerve near the injection site.   Pain at the injection site.   Weakness or numbness in areas controlled by nerves near the injection site.   Infection.   Temporary fluid retention.   Allergic reaction to anesthetics or medicines used during the procedure. BEFORE THE PROCEDURE   Follow your health care provider's instructions if you are taking dietary supplements or medicines. You may need to stop taking them or reduce your dosage.   Do not take any new dietary supplements or medicines without asking your health care provider first.   Follow your health care provider's instructions about eating and drinking before the procedure. You may need to stop eating and  drinking several hours before the procedure.   Arrange to have an adult drive you home after the procedure. PROCEDURE  You may need to remove your clothing and dress in an open-back gown so that your health care provider can access your spine.   The procedure will be done while you are lying on an X-ray table. Most of the time you will be asked to lie on your stomach, but you may be asked to lie in a different position if an injection will be made in your neck.   Special machines will be used to monitor your oxygen levels, heart rate, and blood pressure.   If an injection will be made in your neck, an intravenous (IV) tube will be inserted into one of your veins. Fluids and medicine will flow directly into your body through the IV tube.   The area over the facet joint where the injection will be made will be cleaned with an antiseptic soap. The surrounding skin will be covered with sterile drapes.   An anesthetic will be applied to your skin to make the injection area numb. You may feel a temporary stinging or burning sensation.   A video X-ray machine will be used to locate the joint. A contrast dye may be injected into the facet joint area to help with locating the joint.   When the joint is located, an anesthetic medicine will be injected into the joint through the needle.   Your health care provider will ask you whether you feel pain relief. If you do feel relief, a steroid may be injected to provide pain relief for a longer period of time. If you do not feel relief or feel only partial relief, additional injections of an anesthetic may be made in other facet joints.   The needle will  be removed, the skin will be cleansed, and bandages will be applied.  AFTER THE PROCEDURE   You will be observed for 15-30 minutes before being allowed to go home. Do not drive. Have an adult drive you or take a taxi or public transportation instead.   If you feel pain relief, the pain will  return in several hours or days when the anesthetic wears off.   You may feel pain relief 2-14 days after the procedure. The amount of time this relief lasts varies from person to person.   It is normal to feel some tenderness over the injected area(s) for 2 days following the procedure.   If you have diabetes, you may have a temporary increase in blood sugar.   This information is not intended to replace advice given to you by your health care provider. Make sure you discuss any questions you have with your health care provider.   Document Released: 06/09/2006 Document Revised: 02/08/2014 Document Reviewed: 11/08/2011 [employer] Interactive Patient Education [employer].

## 2015-12-15 ENCOUNTER — Other Ambulatory Visit (INDEPENDENT_AMBULATORY_CARE_PROVIDER_SITE_OTHER): Payer: Self-pay | Admitting: Vascular Surgery

## 2015-12-15 DIAGNOSIS — I6523 Occlusion and stenosis of bilateral carotid arteries: Secondary | ICD-10-CM

## 2015-12-15 DIAGNOSIS — I714 Abdominal aortic aneurysm, without rupture, unspecified: Secondary | ICD-10-CM

## 2015-12-17 ENCOUNTER — Ambulatory Visit (INDEPENDENT_AMBULATORY_CARE_PROVIDER_SITE_OTHER): Payer: PPO | Admitting: Vascular Surgery

## 2015-12-17 ENCOUNTER — Ambulatory Visit (INDEPENDENT_AMBULATORY_CARE_PROVIDER_SITE_OTHER): Payer: PPO

## 2015-12-17 ENCOUNTER — Encounter (INDEPENDENT_AMBULATORY_CARE_PROVIDER_SITE_OTHER): Payer: Self-pay | Admitting: Vascular Surgery

## 2015-12-17 VITALS — BP 133/76 | HR 78 | Resp 16 | Ht 73.0 in | Wt 177.0 lb

## 2015-12-17 DIAGNOSIS — E785 Hyperlipidemia, unspecified: Secondary | ICD-10-CM

## 2015-12-17 DIAGNOSIS — I714 Abdominal aortic aneurysm, without rupture, unspecified: Secondary | ICD-10-CM

## 2015-12-17 DIAGNOSIS — I10 Essential (primary) hypertension: Secondary | ICD-10-CM

## 2015-12-17 DIAGNOSIS — I6523 Occlusion and stenosis of bilateral carotid arteries: Secondary | ICD-10-CM | POA: Diagnosis not present

## 2015-12-17 NOTE — Progress Notes (Signed)
Subjective:    Patient ID: Justin Harrington, male    DOB: March 02, 1945, 70 y.o.   MRN: [id number] Chief Complaint  Patient presents with  . Follow-up   Patient presents for a six month carotid and AAA follow up. The patient underwent a bilateral carotid duplex scan which showed no change from the previous exam on 06-16-15. Duplex is stable with a patent Right ICA stenosis and Left ICA stenosis (1-39%). The patient denies experiencing Amaurosis Fugax, TIA like symptoms or focal motor deficits. He underwent an aortic duplex which was notable for an abdominal aortic aneurysm measuring 3.81cm AP x 4.23cm transverse (previous 3.69cm x 3.98 on 06-16-15). He denies any symptoms such as back pain, pulsatile abdominal masses or thrombosis in his extremities.   Review of Systems  Constitutional: Negative.   HENT: Negative.   Eyes: Negative.   Respiratory: Negative.   Cardiovascular:       Carotid Stenosis, AAA  Gastrointestinal: Negative.   Endocrine: Negative.   Genitourinary: Negative.   Musculoskeletal: Negative.   Skin: Negative.   Allergic/Immunologic: Negative.   Neurological: Negative.   Hematological: Negative.   Psychiatric/Behavioral: Negative.       Objective:   Physical Exam  Constitutional: He is oriented to person, place, and time. He appears well-developed and well-nourished.  HENT:  Head: Normocephalic and atraumatic.  Right Ear: External ear normal.  Left Ear: External ear normal.  Eyes: Conjunctivae and EOM are normal. Pupils are equal, round, and reactive to light.  Neck: Normal range of motion.  Cardiovascular: Normal rate, regular rhythm, normal heart sounds and intact distal pulses.   Pulses:      Radial pulses are 2+ on the right side, and 2+ on the left side.       Dorsalis pedis pulses are 2+ on the right side, and 2+ on the left side.       Posterior tibial pulses are 2+ on the right side, and 2+ on the left side.  Mild Bilateral Carotid Bruit    Pulmonary/Chest: Effort normal and breath sounds normal.  Abdominal: Soft. Bowel sounds are normal.  No abdominal bruit. No palpable aorta.   Musculoskeletal: Normal range of motion. He exhibits no edema.  Neurological: He is alert and oriented to person, place, and time.  Skin: Skin is warm and dry.  Psychiatric: He has a normal mood and affect. His behavior is normal. Judgment and thought content normal.   BP 133/76 (BP Location: Left Arm)   Pulse 78   Resp 16   Ht 6\' 1"  (1.854 m)   Wt 177 lb (80.3 kg)   BMI 23.35 kg/m   Past Medical History:  Diagnosis Date  . Aortic aneurysm ([place])   . CAD (coronary artery disease)   . DDD (degenerative disc disease), lumbar   . Hyperlipidemia   . Personal history of tobacco use, presenting hazards to health 06/12/2015  . Personal history of tobacco use, presenting hazards to health 06/12/2015  . Prostate cancer [facility])    Social History   Social History  . Marital status: Married    Spouse name: N/A  . Number of children: N/A  . Years of education: HS Diploma   Occupational History  . Not on file.   Social History Main Topics  . Smoking status: Current Every Day Smoker    Packs/day: 0.75    Years: 50.00    Types: Cigarettes  . Smokeless tobacco: Never Used  . Alcohol use No  . Drug use:  No  . Sexual activity: Not on file   Other Topics Concern  . Not on file   Social History Narrative  . No narrative on file   Past Surgical History:  Procedure Laterality Date  . BACK SURGERY  1998   Lumbar spine fusion  . carotid doppler ultrasound  10/05/2011   75-90% RCA occlusion, 50% on left  . CAROTID ENDARTERECTOMY Right 12/02/2011   Dr. Lucky Cowboy  . CHOLECYSTECTOMY  10/02/2010   Laparoscopic, Dr. Pat Patrick, [facility]  . 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 Cholelithiasis and acute cholecystiis. Mild enlargement of left adrenal gland  . PROSTATE SURGERY  10/19/2012    prostatectomy. [facility]; Laser assisted, Done by [name] for prostate cancer   Family History  Problem Relation Age of Onset  . Heart attack Mother   . Congestive Heart Failure Father   . Cerebral palsy Son    No Known Allergies     Assessment & Plan:  Patient presents for a six month carotid and AAA follow up. The patient underwent a bilateral carotid duplex scan which showed no change from the previous exam on 06-16-15. Duplex is stable with a patent Right ICA stenosis and Left ICA stenosis (1-39%). The patient denies experiencing Amaurosis Fugax, TIA like symptoms or focal motor deficits. He underwent an aortic duplex which was notable for an abdominal aortic aneurysm measuring 3.81cm AP x 4.23cm transverse (previous 3.69cm x 3.98 on 06-16-15). He denies any symptoms such as back pain, pulsatile abdominal masses or thrombosis in his extremities.  1. AAA (abdominal aortic aneurysm) without rupture ([facility]) - Stable Abdominal Aortic Aneurysm  Studies reviewed with patient. Duplex stable, physical exam unremarkable. No surgery or intervention at this time. The patient to follow up in XX months with an aortic duplex. The patient has an asymptomatic abdominal aortic aneurysm that is less than 4 cm in maximal diameter.  I have reviewed the natural history of abdominal aortic aneurysm and the small risk of rupture for aneurysm less than 5 cm in size.  However, as these small aneurysms tend to enlarge over time, continued surveillance with ultrasound or CT scan is mandatory.  The patient's blood pressure is being adequately controlled however I have reviewed the importance of hypertension and lipid control and the importance of continuing his abstinence from tobacco.  The patient is also encouraged to exercise a minimum of 30 minutes 4 times a week.  Should the patient develop new onset abdominal or back pain or signs of peripheral embolization they are instructed to seek medical  attention immediately and to alert the physician providing care that they have an aneurysm.  The patient voices their understanding.  2. Essential hypertension - Stable Encouraged good control as its slows the progression of atherosclerotic and aneurysmal disease  3. Hyperlipidemia, unspecified hyperlipidemia type - Stable On ASA and statin for medical optimization. Encouraged good control as its slows the progression of atherosclerotic disease  4. Bilateral carotid artery stenosis - Stable Studies reviewed with patient. Patient asymptomatic with stable duplex.  No intervention at this time.  Patient to return in six months for surveillance carotid duplex. Patient to continue medical optimization with ASA and dyslipidemia medication. Patient to remain abstinent of tobacco use. I have discussed with the patient at length the risk factors for and pathogenesis of atherosclerotic disease and encouraged a healthy diet, regular exercise regimen and blood pressure / glucose control.  Patient was  instructed to contact our office in the interim with problems such as arm / leg weakness or numbness, speech / swallowing difficulty or temporary monocular blindness. The patient expresses their understanding.    Current Outpatient Prescriptions on File Prior to Visit  Medication Sig Dispense Refill  . aspirin EC 81 MG tablet Take 1 tablet by mouth daily.    Marland Kitchen atorvastatin (LIPITOR) 80 MG tablet Take 1 tablet (80 mg total) by mouth daily. 90 tablet 4  . hydrochlorothiazide (HYDRODIURIL) 25 MG tablet Take 1 tablet (25 mg total) by mouth daily. 90 tablet 4  . lubiprostone (AMITIZA) 24 MCG capsule Take 1 capsule (24 mcg total) by mouth 2 (two) times daily with a meal. Swallow the medication whole. Do not break or chew the medication. 60 capsule PRN  . Magnesium Oxide 500 MG CAPS Take 1 capsule (500 mg total) by mouth 2 (two) times daily at 8 am and 10 pm. 100 capsule PRN  . oxycodone (ROXICODONE) 30 MG  immediate release tablet One every four to six hours as needed 120 tablet 0  . traZODone (DESYREL) 150 MG tablet Take 1 tablet (150 mg total) by mouth at bedtime. 90 tablet 3  . Wheat Dextrin (BENEFIBER) POWD Stir 2 tsp. TID into 4-8 oz of any non-carbonated beverage or soft food (hot or cold) 500 g PRN   No current facility-administered medications on file prior to visit.     There are no Patient Instructions on file for this visit. No Follow-up on file.   Olayinka Gathers A Blayde Bacigalupi, PA-C

## 2015-12-30 ENCOUNTER — Telehealth: Payer: Self-pay | Admitting: Family Medicine

## 2015-12-30 NOTE — Telephone Encounter (Signed)
Called Pt to schedule AWV with [facility] - knb °

## 2016-01-01 ENCOUNTER — Encounter: Payer: Self-pay | Admitting: Family Medicine

## 2016-01-01 ENCOUNTER — Ambulatory Visit (INDEPENDENT_AMBULATORY_CARE_PROVIDER_SITE_OTHER): Payer: PPO | Admitting: Family Medicine

## 2016-01-01 VITALS — BP 102/60 | HR 85 | Temp 97.6°F | Resp 16 | Wt 180.0 lb

## 2016-01-01 DIAGNOSIS — R3911 Hesitancy of micturition: Secondary | ICD-10-CM | POA: Diagnosis not present

## 2016-01-01 DIAGNOSIS — E876 Hypokalemia: Secondary | ICD-10-CM

## 2016-01-01 DIAGNOSIS — Z9079 Acquired absence of other genital organ(s): Secondary | ICD-10-CM

## 2016-01-01 NOTE — Progress Notes (Signed)
Patient: Justin Harrington Male    DOB: 1945-12-31   70 y.o.   MRN: [id number] Visit Date: 01/01/2016  Today's Provider: [name], MD   Chief Complaint  Patient presents with  . Urine Output   Subjective:    HPI Urine problems:  Patient presents today reporting for the past month he has noticed a decrease in his urine output for the last month. He has also had a weaker stream. Patient denies having painful urination or odor in his urine. He has history of prostatectomy for prostate cancer, but is not long followed by urology.  He was also noted to have low potassium of 3.2 and magnesium levels of 1.6  at the pain clinic, started on supplements and advised to follow up with PCP.    Marland Kitchen      No Known Allergies   Current Outpatient Prescriptions:  .  aspirin EC 81 MG tablet, Take 1 tablet by mouth daily., Disp: , Rfl:  .  atorvastatin (LIPITOR) 80 MG tablet, Take 1 tablet (80 mg total) by mouth daily., Disp: 90 tablet, Rfl: 4 .  hydrochlorothiazide (HYDRODIURIL) 25 MG tablet, Take 1 tablet (25 mg total) by mouth daily., Disp: 90 tablet, Rfl: 4 .  lubiprostone (AMITIZA) 24 MCG capsule, Take 1 capsule (24 mcg total) by mouth 2 (two) times daily with a meal. Swallow the medication whole. Do not break or chew the medication., Disp: 60 capsule, Rfl: PRN .  Magnesium Oxide 500 MG CAPS, Take 1 capsule (500 mg total) by mouth 2 (two) times daily at 8 am and 10 pm., Disp: 100 capsule, Rfl: PRN .  oxycodone (ROXICODONE) 30 MG immediate release tablet, One every four to six hours as needed, Disp: 120 tablet, Rfl: 0 .  traZODone (DESYREL) 150 MG tablet, Take 1 tablet (150 mg total) by mouth at bedtime., Disp: 90 tablet, Rfl: 3 .  Wheat Dextrin (BENEFIBER) POWD, Stir 2 tsp. TID into 4-8 oz of any non-carbonated beverage or soft food (hot or cold), Disp: 500 g, Rfl: PRN  Review of Systems  Constitutional: Negative for appetite change, chills and fever.  Respiratory: Negative for  chest tightness, shortness of breath and wheezing.   Cardiovascular: Negative for chest pain and palpitations.  Gastrointestinal: Negative for abdominal pain, nausea and vomiting.  Endocrine: Negative for cold intolerance, heat intolerance, polydipsia, polyphagia and polyuria.  Genitourinary: Positive for decreased urine volume, difficulty urinating and hematuria. Negative for discharge, dysuria, enuresis, flank pain, frequency, genital sores, penile pain, penile swelling, scrotal swelling, testicular pain and urgency.    Social History  Substance Use Topics  . Smoking status: Current Every Day Smoker    Packs/day: 0.75    Years: 50.00    Types: Cigarettes  . Smokeless tobacco: Never Used  . Alcohol use No   Objective:   BP 102/60 (BP Location: Right Arm, Patient Position: Sitting, Cuff Size: Normal)   Pulse 85   Temp 97.6 F (36.4 C) (Oral)   Resp 16   Wt 180 lb (81.6 kg)   SpO2 96% Comment: room air  BMI 23.75 kg/m   Physical Exam  General appearance: alert, well developed, well nourished, cooperative and in no distress Head: Normocephalic, without obvious abnormality, atraumatic Respiratory: Respirations even and unlabored, normal respiratory rate Extremities: No gross deformities Skin: Skin color, texture, turgor normal. No rashes seen  Psych: Appropriate mood and affect. Neurologic: Mental status: Alert, oriented to person, place, and time, thought content appropriate.  Assessment & Plan:     1. Urinary hesitancy  - Ambulatory referral to Urology  2. H/O prostatectomy  3. Hypokalemia  - Renal function panel  4. Hypomagnesemia  - Magnesium          [name], MD  [facility]

## 2016-01-02 LAB — RENAL FUNCTION PANEL
ALBUMIN: 4.3 g/dL (ref 3.6–4.8)
BUN / CREAT RATIO: 13 (ref 10–24)
BUN: 15 mg/dL (ref 8–27)
CALCIUM: 9.8 mg/dL (ref 8.6–10.2)
CO2: 30 mmol/L — ABNORMAL HIGH (ref 18–29)
CREATININE: 1.15 mg/dL (ref 0.76–1.27)
Chloride: 100 mmol/L (ref 96–106)
GFR calc Af Amer: 75 mL/min/{1.73_m2} (ref >59)
GFR calc non Af Amer: 65 mL/min/{1.73_m2} (ref >59)
Glucose: 98 mg/dL (ref 65–99)
PHOSPHORUS: 3.2 mg/dL (ref 2.5–4.5)
Potassium: 4.2 mmol/L (ref 3.5–5.2)
Sodium: 144 mmol/L (ref 134–144)

## 2016-01-02 LAB — MAGNESIUM: MAGNESIUM: 2.5 mg/dL — AB (ref 1.6–2.3)

## 2016-01-12 ENCOUNTER — Other Ambulatory Visit: Payer: Self-pay | Admitting: Family Medicine

## 2016-01-12 DIAGNOSIS — M5136 Other intervertebral disc degeneration, lumbar region: Secondary | ICD-10-CM

## 2016-01-12 NOTE — Telephone Encounter (Signed)
Pt contacted office for refill request on the following medications: oxycodone (ROXICODONE) 30 MG immediate release tablet Last Rx: 12/08/15 Last OV: 01/01/16 Please advise. Thanks TNP

## 2016-01-12 NOTE — Telephone Encounter (Signed)
Please review. Thanks!  

## 2016-01-13 MED ORDER — OXYCODONE HCL 30 MG PO TABS: ORAL_TABLET | ORAL | 0 refills | Status: DC

## 2016-01-14 ENCOUNTER — Ambulatory Visit (HOSPITAL_BASED_OUTPATIENT_CLINIC_OR_DEPARTMENT_OTHER): Payer: PPO | Admitting: Pain Medicine

## 2016-01-14 ENCOUNTER — Encounter: Payer: Self-pay | Admitting: Pain Medicine

## 2016-01-14 ENCOUNTER — Ambulatory Visit
Admission: RE | Admit: 2016-01-14 | Discharge: 2016-01-14 | Disposition: A | Discharge status: Home / Self Care | Payer: PPO | Source: Ambulatory Visit | Attending: Pain Medicine | Admitting: Pain Medicine

## 2016-01-14 VITALS — BP 157/78 | HR 57 | Temp 97.4°F | Resp 14 | Ht 74.0 in | Wt 175.0 lb

## 2016-01-14 DIAGNOSIS — G8929 Other chronic pain: Secondary | ICD-10-CM | POA: Insufficient documentation

## 2016-01-14 DIAGNOSIS — Z9889 Other specified postprocedural states: Secondary | ICD-10-CM | POA: Diagnosis not present

## 2016-01-14 DIAGNOSIS — M545 Low back pain: Secondary | ICD-10-CM | POA: Insufficient documentation

## 2016-01-14 DIAGNOSIS — M1288 Other specific arthropathies, not elsewhere classified, other specified site: Secondary | ICD-10-CM | POA: Insufficient documentation

## 2016-01-14 DIAGNOSIS — Z981 Arthrodesis status: Secondary | ICD-10-CM | POA: Diagnosis not present

## 2016-01-14 DIAGNOSIS — M533 Sacrococcygeal disorders, not elsewhere classified: Secondary | ICD-10-CM | POA: Diagnosis not present

## 2016-01-14 DIAGNOSIS — M47816 Spondylosis without myelopathy or radiculopathy, lumbar region: Secondary | ICD-10-CM | POA: Diagnosis not present

## 2016-01-14 MED ORDER — MIDAZOLAM HCL 5 MG/5ML IJ SOLN
1.0000 mg | INTRAMUSCULAR | Status: DC | PRN
  Filled 2016-01-14: qty 5

## 2016-01-14 MED ORDER — ROPIVACAINE HCL 2 MG/ML IJ SOLN
9.0000 mL | Freq: Once | INTRAMUSCULAR | Status: AC
  Administered 2016-01-14: 9 mL
  Filled 2016-01-14: qty 10

## 2016-01-14 MED ORDER — LIDOCAINE HCL (PF) 1 % IJ SOLN
10.0000 mL | Freq: Once | INTRAMUSCULAR | Status: AC
  Administered 2016-01-14: 10 mL
  Filled 2016-01-14: qty 10

## 2016-01-14 MED ORDER — TRIAMCINOLONE ACETONIDE 40 MG/ML IJ SUSP
40.0000 mg | Freq: Once | INTRAMUSCULAR | Status: AC
  Administered 2016-01-14: 40 mg
  Filled 2016-01-14: qty 1

## 2016-01-14 MED ORDER — LACTATED RINGERS IV SOLN
1000.0000 mL | Freq: Once | INTRAVENOUS | Status: AC
  Administered 2016-01-14: 1000 mL via INTRAVENOUS

## 2016-01-14 MED ORDER — TRIAMCINOLONE ACETONIDE 40 MG/ML IJ SUSP
40.0000 mg | Freq: Once | INTRAMUSCULAR | Status: AC
  Administered 2016-01-14: 40 mg

## 2016-01-14 MED ORDER — LIDOCAINE HCL (PF) 1 % IJ SOLN
10.0000 mL | Freq: Once | INTRAMUSCULAR | Status: AC
  Administered 2016-01-14: 10 mL

## 2016-01-14 MED ORDER — METHYLPREDNISOLONE ACETATE 80 MG/ML IJ SUSP
80.0000 mg | Freq: Once | INTRAMUSCULAR | Status: AC
  Administered 2016-01-14: 80 mg
  Filled 2016-01-14: qty 1

## 2016-01-14 MED ORDER — FENTANYL CITRATE (PF) 100 MCG/2ML IJ SOLN
25.0000 ug | INTRAMUSCULAR | Status: DC | PRN
  Filled 2016-01-14: qty 2

## 2016-01-14 NOTE — Progress Notes (Signed)
Safety precautions to be maintained throughout the outpatient stay will include: orient to surroundings, keep bed in low position, maintain call bell within reach at all times, provide assistance with transfer out of bed and ambulation.  

## 2016-01-14 NOTE — Patient Instructions (Signed)

## 2016-01-14 NOTE — Progress Notes (Signed)
Patient's Name: Justin Harrington  MRN: [id number]  Referring Provider: [name], MD  DOB: 05-Nov-1945  PCP: [name], MD  DOS: 01/14/2016  Note by: Kathlen Brunswick. Dossie Arbour, MD  Service setting: Ambulatory outpatient  Location: [facility] (AMB) Pain Management Facility  Visit type: Procedure  Specialty: Interventional Pain Management  Patient type: Established   Primary Reason for Visit: Interventional Pain Management Treatment. CC: Back Pain (lower back worse on the right)  Procedure:  Anesthesia, Analgesia, Anxiolysis:  Procedure #1: Type: Diagnostic Medial Branch Facet Block Region: Lumbar Level: L2, L3, L4, L5, & S1 Medial Branch Level(s) Laterality: Bilateral  Procedure #2: Type: Diagnostic Sacroiliac Joint Block Region: Posterior Lumbosacral Level: PSIS (Posterior Superior Iliac Spine) Sacroiliac Joint Laterality: Bilateral  Type: Local Anesthesia with Moderate (Conscious) Sedation Local Anesthetic: Lidocaine 1% Route: Intravenous (IV) IV Access: Secured Sedation: Meaningful verbal contact was maintained at all times during the procedure  Indication(s): Analgesia and Anxiety  Indications: 1. Lumbar facet syndrome (Bilateral) (R>L)   2. Lumbar spondylosis   3. Chronic sacroiliac joint pain (Bilateral) (R>L)    Pain Score: Pre-procedure: 5 /10 Post-procedure: 0-No pain/10  Pre-Procedure Assessment:  [name] is a 70 y.o. (year old), male patient, seen today for interventional treatment. He  has a past surgical history that includes Prostate surgery (10/19/2012); Back surgery (1998); Cholecystectomy (10/02/2010); Carotid endarterectomy (Right, 12/02/2011); carotid doppler ultrasound (10/05/2011); and CT scan (10/02/2010).. His primarily concern today is the Back Pain (lower back worse on the right) The primary encounter diagnosis was Lumbar facet syndrome (Bilateral) (R>L). Diagnoses of Lumbar spondylosis and Chronic sacroiliac joint pain (Bilateral) (R>L) were also  pertinent to this visit.  Pain Type: Chronic pain Pain Location: Back Pain Orientation: Lower, Right Pain Descriptors / Indicators: Constant, Aching Pain Frequency: Constant  Date of Last Visit: 12/10/15 Service Provided on Last Visit: Med Refill  Coagulation Parameters Lab Results  Component Value Date   INR 1.0 12/03/2011   LABPROT 13.2 12/03/2011   APTT 28.5 12/03/2011   PLT 173 12/03/2011   Verification of the correct person, correct site (including marking of site), and correct procedure were performed and confirmed by the patient.  Consent: Before the procedure and under the influence of no sedative(s), amnesic(s), or anxiolytics, the patient was informed of the treatment options, risks and possible complications. To fulfill our ethical and legal obligations, as recommended by the American Medical Association's Code of Ethics, I have informed the patient of my clinical impression; the nature and purpose of the treatment or procedure; the risks, benefits, and possible complications of the intervention; the alternatives, including doing nothing; the risk(s) and benefit(s) of the alternative treatment(s) or procedure(s); and the risk(s) and benefit(s) of doing nothing. The patient was provided information about the general risks and possible complications associated with the procedure. These may include, but are not limited to: failure to achieve desired goals, infection, bleeding, organ or nerve damage, allergic reactions, paralysis, and death. In addition, the patient was informed of those risks and complications associated to Spine-related procedures, such as failure to decrease pain; infection (i.e.: Meningitis, epidural or intraspinal abscess); bleeding (i.e.: epidural hematoma, subarachnoid hemorrhage, or any other type of intraspinal or peri-dural bleeding); organ or nerve damage (i.e.: Any type of peripheral nerve, nerve root, or spinal cord injury) with subsequent damage to  sensory, motor, and/or autonomic systems, resulting in permanent pain, numbness, and/or weakness of one or several areas of the body; allergic reactions; (i.e.: anaphylactic reaction); and/or death. Furthermore, the patient  was informed of those risks and complications associated with the medications. These include, but are not limited to: allergic reactions (i.e.: anaphylactic or anaphylactoid reaction(s)); adrenal axis suppression; blood sugar elevation that in diabetics may result in ketoacidosis or comma; water retention that in patients with history of congestive heart failure may result in shortness of breath, pulmonary edema, and decompensation with resultant heart failure; weight gain; swelling or edema; medication-induced neural toxicity; particulate matter embolism and blood vessel occlusion with resultant organ, and/or nervous system infarction; and/or aseptic necrosis of one or more joints. Finally, the patient was informed that Medicine is not an exact science; therefore, there is also the possibility of unforeseen or unpredictable risks and/or possible complications that may result in a catastrophic outcome. The patient indicated having understood very clearly. We have given the patient no guarantees and we have made no promises. Enough time was given to the patient to ask questions, all of which were answered to the patient's satisfaction. Mr. Hopps has indicated that he wanted to continue with the procedure.  Consent Attestation: I, the ordering provider, attest that I have discussed with the patient the benefits, risks, side-effects, alternatives, likelihood of achieving goals, and potential problems during recovery for the procedure that I have provided informed consent.  Pre-Procedure Preparation:  Safety Precautions: Allergies reviewed. The patient was asked about blood thinners, or active infections, both of which were denied. The patient was asked to confirm the procedure and  laterality, before marking the site, and again before commencing the procedure. Appropriate site, procedure, and patient were confirmed by following the Joint Commission's Universal Protocol (UP.01.01.01), in the form of a "Time Out". The patient was asked to participate by confirming the accuracy of the "Time Out" information. Patient was assessed for positional comfort and pressure points before starting the procedure. Allergies: He has No Known Allergies. Allergy Precautions: None required Infection Control Precautions: Sterile technique used. Standard Universal Precautions were taken as recommended by the Department of [facility] for Disease Control and Prevention (CDC). Standard pre-surgical skin prep was conducted. Respiratory hygiene and cough etiquette was practiced. Hand hygiene observed. Safe injection practices and needle disposal techniques followed. SDV (single dose vial) medications used. Medications properly checked for expiration dates and contaminants. Personal protective equipment (PPE) used as per protocol. Monitoring:  As per clinic protocol. Vitals:   01/14/16 1143 01/14/16 1150 01/14/16 1159 01/14/16 1210  BP: (!) 154/76 (!) 165/76 (!) 150/75 (!) 157/78  Pulse:    (!) 57  Resp: 12 12 14 14   Temp:  97.8 F (36.6 C)  97.4 F (36.3 C)  TempSrc:  Temporal  Temporal  SpO2: 92% 96% 96% 99%  Weight:      Height:      Calculated BMI: Body mass index is 22.47 kg/m. Time-out: "Time-out" completed before starting procedure, as per protocol.  Imaging Review  Lumbosacral Imaging: Lumbar DG 2-3 views:  Results for orders placed in visit on 08/16/08  DG Lumbar Spine 2-3 Views   Narrative * PRIOR REPORT IMPORTED FROM AN EXTERNAL SYSTEM *   PRIOR REPORT IMPORTED FROM THE [employer] WORKFLOW SYSTEM   REASON FOR EXAM:    back pain  COMMENTS:   PROCEDURE:     KDR - KDXR LUMBAR SPINE AP AND LATERAL  - Aug 16 2008  11:22AM   RESULT:     The lumbar vertebral bodies are  preserved in height. The  patient  has undergone posterior fusion. There are metallic screws and plates  present  at the L4 through S1 levels. There is disc space narrowing at L4-L5. There  is facet joint fusion at this level. The other lumbar vertebral bodies  appear preserved in height. There is diffuse osteopenia.   IMPRESSION:      There are degenerative changes of the lower lumbar spine  with evidence of prior posterior fusion.       Lumbar DG (Complete) 4+V:  Results for orders placed during the hospital encounter of 07/01/15  DG Lumbar Spine Complete   Narrative CLINICAL DATA:  Lumbar pain, bilateral pain without sciatica. Degeneration of intervertebral disc of lumbar region.  1998 spinal surgery.  Worsening pain over the past 6 months.  EXAM: LUMBAR SPINE - COMPLETE 4+ VIEW  COMPARISON:  Lumbar spine plain film dated 08/16/2008.  FINDINGS: Posterior fusion hardware appears stable in position at the L4 through S1 levels. Overall osseous alignment of the lumbar spine is stable, with moderate levoscoliosis centered at the L3 level. No acute or suspicious osseous lesion. No fracture line or displaced fracture fragment. Lumbar vertebral bodies are stable in height.  Again noted is the disc desiccation at the L4-5 level, stable in appearance, moderate in degree, with facet joint fusion posteriorly. Milder disc desiccations again noted at the L3-4 and L5-S1 levels, similar or slightly progressed compared to the previous exam.  Atherosclerotic changes are noted along the walls of the infrarenal abdominal aorta. Cholecystectomy clips noted in the right upper quadrant. Paravertebral soft tissues are otherwise unremarkable  IMPRESSION: 1. Lumbar spine appears stable compared to the previous exam of 08/16/2008, perhaps mild progression of the disc desiccations at the L3-4 and L5-S1 levels. 2. Posterior fusion hardware appears intact and stable in alignment at the L4 through  S1 levels. 3. No acute findings.   Electronically Signed   By: Franki Cabot M.D.   On: 07/01/2015 14:14    Description of Procedure #1 Process:   Time-out: "Time-out" completed before starting procedure, as per protocol. Position: Prone Target Area: For Lumbar Facet blocks, the target is the groove formed by the junction of the transverse process and superior articular process. For the L5 dorsal ramus, the target is the notch between superior articular process and sacral ala. For the S1 dorsal ramus, the target is the superior and lateral edge of the posterior S1 Sacral foramen. Approach: Paramedial approach. Area Prepped: Entire Posterior Lumbosacral Region Prepping solution: ChloraPrep (2% chlorhexidine gluconate and 70% isopropyl alcohol) Safety Precautions: Aspiration looking for blood return was conducted prior to all injections. At no point did we inject any substances, as a needle was being advanced. No attempts were made at seeking any paresthesias. Safe injection practices and needle disposal techniques used. Medications properly checked for expiration dates. SDV (single dose vial) medications used.  Description of the Procedure: Protocol guidelines were followed. The patient was placed in position over the fluoroscopy table. The target area was identified and the area prepped in the usual manner. Skin desensitized using vapocoolant spray. Skin & deeper tissues infiltrated with local anesthetic. Appropriate amount of time allowed to pass for local anesthetics to take effect. The procedure needle was introduced through the skin, ipsilateral to the reported pain, and advanced to the target area. Employing the "Medial Branch Technique", the needles were advanced to the angle made by the superior and medial portion of the transverse process, and the lateral and inferior portion of the superior articulating process of the targeted vertebral bodies. This area is known as "Burton's Eye" or the  "Eye  of the [place] Dog". A procedure needle was introduced through the skin, and this time advanced to the angle made by the superior and medial border of the sacral ala, and the lateral border of the S1 vertebral body. This last needle was later repositioned at the superior and lateral border of the posterior S1 foramen. Negative aspiration confirmed. Solution injected in intermittent fashion, asking for systemic symptoms every 0.5cc of injectate. The needles were then removed and the area cleansed, making sure to leave some of the prepping solution back to take advantage of its long term bactericidal properties. Materials & Medications:  Needle(s) Used: 22g - 3.5" Spinal Needle(s)  Description of Procedure # 2 Process:   Time-out: "Time-out" completed before starting procedure, as per protocol. Position: Prone Target Area: For upper sacroiliac joint block(s), the target is the superior and posterior margin of the sacroiliac joint. Approach: Ipsilateral approach. Area Prepped: Entire Posterior Lumbosacral Region Prepping solution: Duraprep (Iodine Povacrylex [0.7% available Iodine] and Isopropyl Alcohol, 74% w/w) Safety Precautions: Aspiration looking for blood return was conducted prior to all injections. At no point did we inject any substances, as a needle was being advanced. No attempts were made at seeking any paresthesias. Safe injection practices and needle disposal techniques used. Medications properly checked for expiration dates. SDV (single dose vial) medications used. Description of the Procedure: Protocol guidelines were followed. The patient was placed in position over the fluoroscopy table. The target area was identified and the area prepped in the usual manner. Skin desensitized using vapocoolant spray. Skin & deeper tissues infiltrated with local anesthetic. Appropriate amount of time allowed to pass for local anesthetics to take effect. The procedure needle was advanced under  fluoroscopic guidance into the sacroiliac joint until a firm endpoint was obtained. Proper needle placement secured. Negative aspiration confirmed. Solution injected in intermittent fashion, asking for systemic symptoms every 0.5cc of injectate. The needles were then removed and the area cleansed, making sure to leave some of the prepping solution back to take advantage of its long term bactericidal properties. EBL: None Materials & Medications:  Needle(s) Type: Epidural needle Gauge: 22G Length: 3.5-in Medication(s): We administered lactated ringers, methylPREDNISolone acetate, ropivacaine (PF) 2 mg/mL (0.2%), lidocaine (PF), triamcinolone acetonide, lidocaine (PF), ropivacaine (PF) 2 mg/mL (0.2%), triamcinolone acetonide, and ropivacaine (PF) 2 mg/mL (0.2%). Please see chart orders for dosing details.  Imaging Guidance (Spinal):  Type of Imaging Technique: Fluoroscopy Guidance (Spinal) Indication(s): Assistance in needle guidance and placement for procedures requiring needle placement in or near specific anatomical locations not easily accessible without such assistance. Exposure Time: Please see nurses notes. Contrast: None used. Fluoroscopic Guidance: I was personally present during the use of fluoroscopy. "Tunnel Vision Technique" used to obtain the best possible view of the target area. Parallax error corrected before commencing the procedure. "Direction-depth-direction" technique used to introduce the needle under continuous pulsed fluoroscopy. Once target was reached, antero-posterior, oblique, and lateral fluoroscopic projection used confirm needle placement in all planes. Images permanently stored in EMR. Interpretation: No contrast injected. I personally interpreted the imaging intraoperatively. Adequate needle placement confirmed in multiple planes. Permanent images saved into the patient's record.  Antibiotic Prophylaxis:  Indication(s): No indications identified. Type:  Antibiotics  Given (last 72 hours)    None      Post-operative Assessment:  Complications: No immediate post-treatment complications observed by team, or reported by patient. Disposition: The patient tolerated the entire procedure well. A repeat set of vitals were taken after the procedure and the patient was kept  under observation following institutional policy, for this type of procedure. Post-procedural neurological assessment was performed, showing return to baseline, prior to discharge. The patient was provided with post-procedure discharge instructions, including a section on how to identify potential problems. Should any problems arise concerning this procedure, the patient was given instructions to immediately contact us, at any time, without hesitation. In any case, we plan to contact the patient by telephone for a follow-up status report regarding this interventional procedure. Comments:  No additional relevant information.  Plan of Care  Discharge to: Discharge home  Medications ordered for procedure: Meds ordered this encounter  Medications  . fentaNYL (SUBLIMAZE) injection 25-50 mcg    Make sure Narcan is available in the pyxis when using this medication. In the event of respiratory depression (RR< 8/min): Titrate NARCAN (naloxone) in increments of 0.1 to 0.2 mg IV at 2-3 minute intervals, until desired degree of reversal.  . lactated ringers infusion 1,000 mL  . midazolam (VERSED) 5 MG/5ML injection 1-2 mg    Make sure Flumazenil is available in the pyxis when using this medication. If oversedation occurs, administer 0.2 mg IV over 15 sec. If after 45 sec no response, administer 0.2 mg again over 1 min; may repeat at 1 min intervals; not to exceed 4 doses (1 mg)  . methylPREDNISolone acetate (DEPO-MEDROL) injection 80 mg  . ropivacaine (PF) 2 mg/mL (0.2%) (NAROPIN) injection 9 mL  . lidocaine (PF) (XYLOCAINE) 1 % injection 10 mL  . triamcinolone acetonide (KENALOG-40) injection 40 mg  .  lidocaine (PF) (XYLOCAINE) 1 % injection 10 mL  . ropivacaine (PF) 2 mg/mL (0.2%) (NAROPIN) injection 9 mL  . triamcinolone acetonide (KENALOG-40) injection 40 mg  . ropivacaine (PF) 2 mg/mL (0.2%) (NAROPIN) injection 9 mL   Medications administered: (For more details, see medical record) We administered lactated ringers, methylPREDNISolone acetate, ropivacaine (PF) 2 mg/mL (0.2%), lidocaine (PF), triamcinolone acetonide, lidocaine (PF), ropivacaine (PF) 2 mg/mL (0.2%), triamcinolone acetonide, and ropivacaine (PF) 2 mg/mL (0.2%). Lab-work, Procedure(s), & Referral(s) Ordered: Orders Placed This Encounter  Procedures  . LUMBAR FACET(MEDIAL BRANCH NERVE BLOCK) MBNB  . SACROILIAC JOINT INJECTINS  . DG C-Arm 1-60 Min-No Report   Imaging Ordered: No results found for this or any previous visit. New Prescriptions   No medications on file   Physician-requested Follow-up:  Return in about 2 weeks (around 01/28/2016) for Post-Procedure evaluation.  Future Appointments Date Time Provider [place]  02/18/2016 8:45 AM [facility]-NURSE HEALTH ADVISOR [facility] None  02/19/2016 9:00 AM [name], MD [facility] None  03/03/2016 1:45 PM [name], MD [facility] None  06/18/2016 8:30 AM [facility] VASC 2 [facility] None  06/18/2016 9:30 AM Algernon Huxley, MD [facility] None   Primary Care Physician: [name], MD Location: [facility] Note by: Kathlen Brunswick. Dossie Arbour, M.D, DABA, DABAPM, DABPM, DABIPP, FIPP Date: 01/14/16; Time: 3:41 PM  Disclaimer:  Medicine is not an exact science. The only guarantee in medicine is that nothing is guaranteed. It is important to note that the decision to proceed with this intervention was based on the information collected from the patient. The Data and conclusions were drawn from the patient's questionnaire, the interview, and the physical examination. Because the information was provided in large part by the patient, it cannot be  guaranteed that it has not been purposely or unconsciously manipulated. Every effort has been made to obtain as much relevant data as possible for this evaluation. It is important to note that the conclusions  that lead to this procedure are derived in large part from the available data. Always take into account that the treatment will also be dependent on availability of resources and existing treatment guidelines, considered by other Pain Management Practitioners as being common knowledge and practice, at the time of the intervention. For Medico-Legal purposes, it is also important to point out that variation in procedural techniques and pharmacological choices are the acceptable norm. The indications, contraindications, technique, and results of the above procedure should only be interpreted and judged by a Board-Certified Interventional Pain Specialist with extensive familiarity and expertise in the same exact procedure and technique. Attempts at providing opinions without similar or greater experience and expertise than that of the treating physician will be considered as inappropriate and unethical, and shall result in a formal complaint to the state medical board and applicable specialty societies.  Instructions provided at this appointment: Patient Instructions  Pain Management Discharge Instructions  General Discharge Instructions :  If you need to reach your doctor call: Monday-Friday 8:00 am - 4:00 pm at [phone] or toll free [phone].  After clinic hours [phone] to have operator reach doctor.  Bring all of your medication bottles to all your appointments in the pain clinic.  To cancel or reschedule your appointment with Pain Management please remember to call 24 hours in advance to avoid a fee.  Refer to the educational materials which you have been given on: General Risks, I had my Procedure. Discharge Instructions, Post Sedation.  Post Procedure Instructions:  The drugs  you were given will stay in your system until tomorrow, so for the next 24 hours you should not drive, make any legal decisions or drink any alcoholic beverages.  You may eat anything you prefer, but it is better to start with liquids then soups and crackers, and gradually work up to solid foods.  Please notify your doctor immediately if you have any unusual bleeding, trouble breathing or pain that is not related to your normal pain.  Depending on the type of procedure that was done, some parts of your body may feel week and/or numb.  This usually clears up by tonight or the next day.  Walk with the use of an assistive device or accompanied by an adult for the 24 hours.  You may use ice on the affected area for the first 24 hours.  Put ice in a Ziploc bag and cover with a towel and place against area 15 minutes on 15 minutes off.  You may switch to heat after 24 hours.

## 2016-01-15 NOTE — Telephone Encounter (Signed)
Denies any needs- States back a little sore at injection sites- Instructed to use heat today and to call if needed

## 2016-02-09 ENCOUNTER — Other Ambulatory Visit: Payer: Self-pay | Admitting: Family Medicine

## 2016-02-09 DIAGNOSIS — M5136 Other intervertebral disc degeneration, lumbar region: Secondary | ICD-10-CM

## 2016-02-09 MED ORDER — OXYCODONE HCL 30 MG PO TABS: ORAL_TABLET | ORAL | 0 refills | Status: DC

## 2016-02-09 NOTE — Telephone Encounter (Signed)
Pt contacted office for refill request on the following medications: oxycodone (ROXICODONE) 30 MG immediate release tablet  Last Rx: 01/13/16 Last OV:01/01/16 Please advise. Thanks TNP

## 2016-02-10 NOTE — Telephone Encounter (Signed)
Pt advised. Emily Drozdowski, CMA  

## 2016-02-18 ENCOUNTER — Ambulatory Visit: Payer: PPO

## 2016-02-19 ENCOUNTER — Encounter: Payer: PPO | Admitting: Family Medicine

## 2016-03-02 NOTE — Progress Notes (Signed)
Patient's Name: Justin Harrington  MRN: [id number]  Referring Provider: [name], MD  DOB: 21-May-1945  PCP: [name], MD  DOS: 03/03/2016  Note by: Kathlen Brunswick. Dossie Arbour, MD  Service setting: Ambulatory outpatient  Specialty: Interventional Pain Management  Location: [facility] (AMB) Pain Management Facility    Patient type: Established   Primary Reason(s) for Visit: Encounter for prescription drug management & post-procedure evaluation of chronic illness with mild to moderate exacerbation(Level of risk: moderate) CC: Back Pain (right)  HPI  Justin Harrington is a 71 y.o. year old, male patient, who comes today for a post-procedure evaluation and medication management. He has Carotid arterial disease ([place]); Cataract of left eye; Diverticulosis of colon without hemorrhage; Elevated PSA; History of DVT (deep vein thrombosis); Hyperlipidemia; Insomnia; Prostate cancer ([place]); Arthrodesis status; Tobacco abuse; Hypercholesteremia; Hypertension; Opioid-induced constipation (OIC); Long term current use of opiate analgesic; Long term prescription opiate use; Opiate use (180 MME/Day); Encounter for therapeutic drug level monitoring; Chronic low back pain (Location of Primary Source of Pain) (Bilateral) (R>L); Malignant neoplasm of prostate ([place]); Failed back surgical syndrome; History of lumbar fusion (L4-S1 posterior hardware); Lumbar spondylosis; Lumbar facet syndrome (Bilateral) (R>L); Hypokalemia; Hypomagnesemia; Chronic sacroiliac joint pain (Bilateral) (R>L); Chronic hip pain (Bilateral) (R>L); Chronic pain syndrome; Bilateral carotid artery stenosis; AAA (abdominal aortic aneurysm) without rupture ([place]); Urinary hesitancy; and H/O prostatectomy on his problem list. His primarily concern today is the Back Pain (right)  Pain Assessment: Self-Reported Pain Score: 4 /10             Reported level is compatible with observation.       Pain Type: Chronic pain Pain Location: Back Pain Orientation: Right,  Lower Pain Descriptors / Indicators: Aching Pain Frequency: Constant  Justin Harrington was last seen on 01/14/2016 for a procedure. During today's appointment we reviewed Justin Harrington's post-procedure results, as well as his outpatient medication regimen. The patient did extremely well with the diagnostic bilateral lumbar facet block where he got approximately 10 days of complete relief of the pain. We'll repeat this diagnostic injection and if he continues to get this type of relief, we will seriously consider the possibility of radiofrequency ablation. In addition, today we will take over his medication management and start the process of tapering some of these medications down. This will be done slowly at a rate of 5 mg per week to avoid the possibility of withdrawal. In addition, the small decreases well make it difficult for him to even notice the change.  Further details on both, my assessment(s), as well as the proposed treatment plan, please see below.  Controlled Substance Pharmacotherapy Assessment REMS (Risk Evaluation and Mitigation Strategy)  Analgesic: Oxycodone IR 30 mg every 6 hours (120 mg/dayof oxycodone) MME/day:'180mg'$ /day  Justin Martins, RN  03/03/2016  1:23 PM  Sign at close encounter Safety precautions to be maintained throughout the outpatient stay will include: orient to surroundings, keep bed in low position, maintain call bell within reach at all times, provide assistance with transfer out of bed and ambulation.    Pharmacokinetics: Liberation and absorption (onset of action): WNL Distribution (time to peak effect): WNL Metabolism and excretion (duration of action): WNL         Pharmacodynamics: Desired effects: Analgesia: Justin Harrington reports >50% benefit. Functional ability: Patient reports that medication allows him to accomplish basic ADLs Clinically meaningful improvement in function (CMIF): Sustained CMIF goals met Perceived effectiveness: Described as relatively  effective, allowing for increase in activities of daily  living (ADL) Undesirable effects: Side-effects or Adverse reactions: None reported Monitoring: [place] PMP: Online review of the past 16-monthperiod conducted. Compliant with practice rules and regulations List of all UDS test(s) done:  Lab Results  Component Value Date   SUMMARY FINAL 10/16/2015   Last UDS on record: No results found for: TOXASSSELUR UDS interpretation: Compliant          Medication Assessment Form: Reviewed. Patient indicates being compliant with therapy Treatment compliance: Compliant Risk Assessment Profile: Aberrant behavior: See prior evaluations. None observed or detected today Comorbid factors increasing risk of overdose: See prior notes. No additional risks detected today Risk of substance use disorder (SUD): Low Opioid Risk Tool (ORT) Total Score:    Interpretation Table:  Score <3 = Low Risk for SUD  Score between 4-7 = Moderate Risk for SUD  Score >8 = High Risk for Opioid Abuse   Risk Mitigation Strategies:  Patient Counseling: Covered Patient-Prescriber Agreement (PPA): Present and active  Notification to other healthcare providers: Done  Pharmacologic Plan: No change in therapy, at this time  Post-Procedure Assessment  01/14/2016 Procedure: Diagnostic bilateral lumbar facet block + diagnostic bilateral sacroiliac joint block under fluoroscopic guidance and IV sedation Post-procedure pain score: 0/10 (100% relief) Influential Factors: BMI: 22.47 kg/m Intra-procedural challenges: None observed Assessment challenges: None detected         Post-procedural side-effects, adverse reactions, or complications: None reported Reported issues: None  Sedation: Sedation provided. When no sedatives are used, the analgesic levels obtained are directly associated to the effectiveness of the local anesthetics. However, when sedation is provided, the level of analgesia obtained during the initial 1 hour  following the intervention, is believed to be the result of a combination of factors. These factors may include, but are not limited to: 1. The effectiveness of the local anesthetics used. 2. The effects of the analgesic(s) and/or anxiolytic(s) used. 3. The degree of discomfort experienced by the patient at the time of the procedure. 4. The patients ability and reliability in recalling and recording the events. 5. The presence and influence of possible secondary gains and/or psychosocial factors. Reported result: Relief experienced during the 1st hour after the procedure: 100 % (Ultra-Short Term Relief) Interpretative annotation: Analgesia during this period is likely to be Local Anesthetic and/or IV Sedative (Analgesic/Anxiolitic) related.          Effects of local anesthetic: The analgesic effects attained during this period are directly associated to the localized infiltration of local anesthetics and therefore cary significant diagnostic value as to the etiological location, or anatomical origin, of the pain. Expected duration of relief is directly dependent on the pharmacodynamics of the local anesthetic used. Long-acting (4-6 hours) anesthetics used.  Reported result: Relief during the next 4 to 6 hour after the procedure: 100 % (Short-Term Relief) Interpretative annotation: Complete relief would suggest area to be the source of the pain.          Long-term benefit: Defined as the period of time past the expected duration of local anesthetics. With the possible exception of prolonged sympathetic blockade from the local anesthetics, benefits during this period are typically attributed to, or associated with, other factors such as analgesic sensory neuropraxia, antiinflammatory effects, or beneficial biochemical changes provided by agents other than the local anesthetics Reported result: Extended relief following procedure: 100 % (10 days relief) (Long-Term Relief) Interpretative annotation:  Partial relief. This could suggest the algesic mechanism to be a combination of tissue inflammation and mechanical problems.  Current benefits: Defined as persistent relief that continues at this point in time.   Reported results: Treated area: 0 %       Interpretative annotation: Recurrance of symptoms. This would suggest persistent aggravating factors  Interpretation: Results would suggest a successful diagnostic intervention.          Laboratory Chemistry  Inflammation Markers Lab Results  Component Value Date   ESRSEDRATE 1 10/16/2015   CRP 2.7 (H) 10/16/2015   Renal Function Lab Results  Component Value Date   BUN 15 01/01/2016   CREATININE 1.15 01/01/2016   GFRAA 75 01/01/2016   GFRNONAA 65 01/01/2016   Hepatic Function Lab Results  Component Value Date   AST 18 10/16/2015   ALT 16 (L) 10/16/2015   ALBUMIN 4.3 01/01/2016   Electrolytes Lab Results  Component Value Date   NA 144 01/01/2016   K 4.2 01/01/2016   CL 100 01/01/2016   CALCIUM 9.8 01/01/2016   MG 2.5 (H) 01/01/2016   Pain Modulating Vitamins Lab Results  Component Value Date   25OHVITD1 45 10/16/2015   25OHVITD2 <1.0 10/16/2015   25OHVITD3 44 10/16/2015   VITAMINB12 206 10/16/2015   Coagulation Parameters Lab Results  Component Value Date   INR 1.0 12/03/2011   LABPROT 13.2 12/03/2011   APTT 28.5 12/03/2011   PLT 173 12/03/2011   Cardiovascular Lab Results  Component Value Date   HGB 14.5 12/03/2011   HCT 42.7 12/03/2011   Note: Lab results reviewed.  Recent Diagnostic Imaging Review  Dg C-arm 1-60 Min-no Report  Result Date: 01/14/2016 There is no Radiologist interpretation  for this exam.  Note: Imaging results reviewed.          Meds  The patient has a current medication list which includes the following prescription(s): aspirin ec, atorvastatin, fluzone high-dose, hydrochlorothiazide, lubiprostone, naloxone, oxycodone, oxycodone, oxycodone, oxycodone, oxycodone hcl,  trazodone, and benefiber.  Current Outpatient Prescriptions on File Prior to Visit  Medication Sig  . aspirin EC 81 MG tablet Take 1 tablet by mouth daily.  Marland Kitchen atorvastatin (LIPITOR) 80 MG tablet Take 1 tablet (80 mg total) by mouth daily.  Marland Kitchen FLUZONE HIGH-DOSE 0.5 ML SUSY ADM 0.5ML IM UTD  . hydrochlorothiazide (HYDRODIURIL) 25 MG tablet Take 1 tablet (25 mg total) by mouth daily.  Marland Kitchen lubiprostone (AMITIZA) 24 MCG capsule Take 1 capsule (24 mcg total) by mouth 2 (two) times daily with a meal. Swallow the medication whole. Do not break or chew the medication.  . naloxone [facility]) 2 MG/2ML injection   . oxycodone (ROXICODONE) 30 MG immediate release tablet One every four to six hours as needed  . traZODone (DESYREL) 150 MG tablet Take 1 tablet (150 mg total) by mouth at bedtime.  . Wheat Dextrin (BENEFIBER) POWD Stir 2 tsp. TID into 4-8 oz of any non-carbonated beverage or soft food (hot or cold)   No current facility-administered medications on file prior to visit.    ROS  Constitutional: Denies any fever or chills Gastrointestinal: No reported hemesis, hematochezia, vomiting, or acute GI distress Musculoskeletal: Denies any acute onset joint swelling, redness, loss of ROM, or weakness Neurological: No reported episodes of acute onset apraxia, aphasia, dysarthria, agnosia, amnesia, paralysis, loss of coordination, or loss of consciousness  Allergies  Mr. Macomber has No Known Allergies.  [place]  Drug: Mr. Collingsworth  reports that he does not use drugs. Alcohol:  reports that he does not drink alcohol. Tobacco:  reports that he has been smoking Cigarettes.  He has  a 37.50 pack-year smoking history. He has never used smokeless tobacco. Medical:  has a past medical history of Aortic aneurysm ([place]); CAD (coronary artery disease); DDD (degenerative disc disease), lumbar; Hyperlipidemia; Personal history of tobacco use, presenting hazards to health (06/12/2015); Personal history of tobacco use,  presenting hazards to health (06/12/2015); and Prostate cancer ([place]). Family: family history includes Cerebral palsy in his son; Congestive Heart Failure in his father; Heart attack in his mother.  Past Surgical History:  Procedure Laterality Date  . BACK SURGERY  1998   Lumbar spine fusion  . carotid doppler ultrasound  10/05/2011   75-90% RCA occlusion, 50% on left  . CAROTID ENDARTERECTOMY Right 12/02/2011   Dr. Lucky Cowboy  . CHOLECYSTECTOMY  10/02/2010   Laparoscopic, Dr. Pat Patrick, [facility]  . 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 Cholelithiasis and acute cholecystiis. Mild enlargement of left adrenal gland  . PROSTATE SURGERY  10/19/2012   prostatectomy. [facility]; Laser assisted, Done by [name] for prostate cancer   Constitutional Exam  General appearance: Well nourished, well developed, and well hydrated. In no apparent acute distress Vitals:   03/03/16 1319  BP: (!) 151/78  Pulse: 81  Resp: 16  Temp: 98.4 F (36.9 C)  TempSrc: Oral  SpO2: 100%  Weight: 175 lb (79.4 kg)  Height: _0  (1.88 m)   BMI Assessment: Estimated body mass index is 22.47 kg/m as calculated from the following:   Height as of this encounter: _1  (1.88 m).   Weight as of this encounter: 175 lb (79.4 kg).  BMI interpretation table: BMI level Category Range association with higher incidence of chronic pain  <18 kg/m2 Underweight   18.5-24.9 kg/m2 Ideal body weight   25-29.9 kg/m2 Overweight Increased incidence by 20%  30-34.9 kg/m2 Obese (Class I) Increased incidence by 68%  35-39.9 kg/m2 Severe obesity (Class II) Increased incidence by 136%  >40 kg/m2 Extreme obesity (Class Harrington) Increased incidence by 254%   BMI Readings from Last 4 Encounters:  03/03/16 22.47 kg/m  01/14/16 22.47 kg/m  01/01/16 23.75 kg/m  12/17/15 23.35 kg/m   Wt Readings from Last 4 Encounters:  03/03/16 175 lb (79.4 kg)  01/14/16 175 lb  (79.4 kg)  01/01/16 180 lb (81.6 kg)  12/17/15 177 lb (80.3 kg)  Psych/Mental status: Alert, oriented x 3 (person, place, & time)       Eyes: PERLA Respiratory: No evidence of acute respiratory distress  Cervical Spine Exam  Inspection: No masses, redness, or swelling Alignment: Symmetrical Functional ROM: Unrestricted ROM Stability: No instability detected Muscle strength & Tone: Functionally intact Sensory: Unimpaired Palpation: Non-contributory  Upper Extremity (UE) Exam    Side: Right upper extremity  Side: Left upper extremity  Inspection: No masses, redness, swelling, or asymmetry  Inspection: No masses, redness, swelling, or asymmetry  Functional ROM: Unrestricted ROM          Functional ROM: Unrestricted ROM          Muscle strength & Tone: Functionally intact  Muscle strength & Tone: Functionally intact  Sensory: Unimpaired  Sensory: Unimpaired  Palpation: Non-contributory  Palpation: Non-contributory   Thoracic Spine Exam  Inspection: No masses, redness, or swelling Alignment: Symmetrical Functional ROM: Unrestricted ROM Stability: No instability detected Sensory: Unimpaired Muscle strength & Tone: Functionally intact Palpation: Non-contributory  Lumbar Spine Exam  Inspection: No masses, redness, or swelling Alignment: Symmetrical Functional ROM: Decreased ROM Stability: No instability detected Muscle strength &  Tone: Functionally intact Sensory: Movement-associated pain Palpation: Complains of area being tender to palpation Provocative Tests: Lumbar Hyperextension and rotation test: Positive bilaterally for facet joint pain. Patrick's Maneuver: evaluation deferred today              Gait & Posture Assessment  Ambulation: Patient ambulates using a cane Gait: Limited. Using assistive device to ambulate Posture: Antalgic   Lower Extremity Exam    Side: Right lower extremity  Side: Left lower extremity  Inspection: No masses, redness, swelling, or asymmetry   Inspection: No masses, redness, swelling, or asymmetry  Functional ROM: Unrestricted ROM          Functional ROM: Unrestricted ROM          Muscle strength & Tone: Functionally intact  Muscle strength & Tone: Functionally intact  Sensory: Unimpaired  Sensory: Unimpaired  Palpation: Non-contributory  Palpation: Non-contributory   Assessment  Primary Diagnosis & Pertinent Problem List: The primary encounter diagnosis was Chronic pain syndrome. Diagnoses of Chronic low back pain (Location of Primary Source of Pain) (Bilateral) (R>L), Lumbar facet syndrome (Bilateral) (R>L), Chronic sacroiliac joint pain (Bilateral) (R>L), Failed back surgical syndrome, Prostate cancer ([place]), Long term current use of opiate analgesic, and Opiate use (180 MME/Day) were also pertinent to this visit.  Status Diagnosis  Controlled Controlled Controlled 1. Chronic pain syndrome   2. Chronic low back pain (Location of Primary Source of Pain) (Bilateral) (R>L)   3. Lumbar facet syndrome (Bilateral) (R>L)   4. Chronic sacroiliac joint pain (Bilateral) (R>L)   5. Failed back surgical syndrome   6. Prostate cancer ([place])   7. Long term current use of opiate analgesic   8. Opiate use (180 MME/Day)      Plan of Care  Pharmacotherapy (Medications Ordered): Meds ordered this encounter  Medications  . Oxycodone HCl 20 MG TABS    Sig: Take 1 tablet (20 mg total) by mouth 5 (five) times daily.    Dispense:  140 tablet    Refill:  0    Do not place this medication, or any other prescription from our practice, on "Automatic Refill". Patient may have prescription filled one day early if pharmacy is closed on scheduled refill date. Do not fill until: 03/10/16 To last until: 04/07/16  . oxyCODONE (OXY IR/ROXICODONE) 5 MG immediate release tablet    Sig: Take 1 tablet (5 mg total) by mouth 3 (three) times daily. Max: 3/day    Dispense:  21 tablet    Refill:  0    This is an opioid taper. Provide patient with medication in  the exact order and day as requested. Patient may have prescription filled one day early if pharmacy is closed on scheduled refill date. Do not fill until: 03/10/16 To last until: 03/17/16  . oxyCODONE (OXY IR/ROXICODONE) 5 MG immediate release tablet    Sig: Take 1 tablet (5 mg total) by mouth 2 (two) times daily. Max: 2/day    Dispense:  14 tablet    Refill:  0    This is an opioid taper. Provide patient with medication in the exact order and day as requested. Patient may have prescription filled one day early if pharmacy is closed on scheduled refill date. Do not fill until: 03/17/16 To last until: 03/24/16  . oxyCODONE (OXY IR/ROXICODONE) 5 MG immediate release tablet    Sig: Take 1 tablet (5 mg total) by mouth daily. Max: 1/day    Dispense:  7 tablet    Refill:  0  This is an opioid taper. Provide patient with medication in the exact order and day as requested. Patient may have prescription filled one day early if pharmacy is closed on scheduled refill date. Do not fill until: 03/24/16 To last until: 03/31/16   New Prescriptions   OXYCODONE (OXY IR/ROXICODONE) 5 MG IMMEDIATE RELEASE TABLET    Take 1 tablet (5 mg total) by mouth 3 (three) times daily. Max: 3/day   OXYCODONE (OXY IR/ROXICODONE) 5 MG IMMEDIATE RELEASE TABLET    Take 1 tablet (5 mg total) by mouth 2 (two) times daily. Max: 2/day   OXYCODONE (OXY IR/ROXICODONE) 5 MG IMMEDIATE RELEASE TABLET    Take 1 tablet (5 mg total) by mouth daily. Max: 1/day   OXYCODONE HCL 20 MG TABS    Take 1 tablet (20 mg total) by mouth 5 (five) times daily.   Medications administered today: Mr. Mathieson had no medications administered during this visit. Lab-work, procedure(s), and/or referral(s): Orders Placed This Encounter  Procedures  . LUMBAR FACET(MEDIAL BRANCH NERVE BLOCK) MBNB  . SACROILIAC JOINT INJECTINS   Imaging and/or referral(s): None  Interventional therapies: Planned, scheduled, and/or pending:   Diagnostic bilateral   Lumbar facet block + sacroiliac joint block #2   Considering:   Diagnostic bilateral lumbar facet block under fluoroscopic guidance and IV sedation.  Possible bilateral lumbar facet radiofrequency ablation under fluoroscopic guidance and IV sedation.  Diagnostic bilateral sacroiliac joint injection under fluoroscopic guidance, with or without sedation. Possible bilateral sacroiliac joint radiofrequency ablation. Diagnostic bilateral intra-articular hip joint injection under fluoroscopic guidance, with or without sedation. Possible bilateral hip joint radiofrequency ablation. Caudal epidural steroid injection under fluoroscopic guidance, with or without sedation + epidurogram.  Possible Racz procedure.    Palliative PRN treatment(s):   Palliative bilateral lumbar facet block under fluoroscopic guidance and IV sedation.    Provider-requested follow-up: Return in about 4 weeks (around 03/31/2016) for (MD) Med-Mgmt, in addition, procedure (ASAA).  Future Appointments Date Time Provider [place]  03/15/2016 10:00 AM [name], MD [facility] None  03/30/2016 9:30 AM [name], MD [facility] None  06/18/2016 8:30 AM [facility] VASC 2 [facility] None  06/18/2016 9:30 AM Algernon Huxley, MD [facility] None  12/17/2016 9:30 AM [facility] V[facility] 1 [facility] None  12/17/2016 10:30 AM Algernon Huxley, MD [facility] None   Primary Care Physician: [name], MD Location: [facility] Note by: Kathlen Brunswick. Dossie Arbour, M.D, DABA, DABAPM, DABPM, DABIPP, FIPP Date: 03/03/2016; Time: 2:33 PM  Pain Score Disclaimer: We use the NRS-11 scale. This is a self-reported, subjective measurement of pain severity with only modest accuracy. It is used primarily to identify changes within a particular patient. It must be understood that outpatient pain scales are significantly less accurate that those used for research, where they can be applied under ideal controlled circumstances with minimal  exposure to variables. In reality, the score is likely to be a combination of pain intensity and pain affect, where pain affect describes the degree of emotional arousal or changes in action readiness caused by the sensory experience of pain. Factors such as social and work situation, setting, emotional state, anxiety levels, expectation, and prior pain experience may influence pain perception and show large inter-individual differences that may also be affected by time variables.  Patient instructions provided during this appointment: Patient Instructions   GENERAL RISKS AND COMPLICATIONS  What are the risk, side effects and possible complications? Generally speaking, most procedures are safe.  However, with any procedure  there are risks, side effects, and the possibility of complications.  The risks and complications are dependent upon the sites that are lesioned, or the type of nerve block to be performed.  The closer the procedure is to the spine, the more serious the risks are.  Great care is taken when placing the radio frequency needles, block needles or lesioning probes, but sometimes complications can occur. 1. Infection: Any time there is an injection through the skin, there is a risk of infection.  This is why sterile conditions are used for these blocks.  There are four possible types of infection. 1. Localized skin infection. 2. Central Nervous System Infection-This can be in the form of Meningitis, which can be deadly. 3. Epidural Infections-This can be in the form of an epidural abscess, which can cause pressure inside of the spine, causing compression of the spinal cord with subsequent paralysis. This would require an emergency surgery to decompress, and there are no guarantees that the patient would recover from the paralysis. 4. Discitis-This is an infection of the intervertebral discs.  It occurs in about 1% of discography procedures.  It is difficult to treat and it may lead to  surgery.        2. Pain: the needles have to go through skin and soft tissues, will cause soreness.       3. Damage to internal structures:  The nerves to be lesioned may be near blood vessels or    other nerves which can be potentially damaged.       4. Bleeding: Bleeding is more common if the patient is taking blood thinners such as  aspirin, Coumadin, Ticiid, Plavix, etc., or if he/she have some genetic predisposition  such as hemophilia. Bleeding into the spinal canal can cause compression of the spinal  cord with subsequent paralysis.  This would require an emergency surgery to  decompress and there are no guarantees that the patient would recover from the  paralysis.       5. Pneumothorax:  Puncturing of a lung is a possibility, every time a needle is introduced in  the area of the chest or upper back.  Pneumothorax refers to free air around the  collapsed lung(s), inside of the thoracic cavity (chest cavity).  Another two possible  complications related to a similar event would include: Hemothorax and Chylothorax.   These are variations of the Pneumothorax, where instead of air around the collapsed  lung(s), you may have blood or chyle, respectively.       6. Spinal headaches: They may occur with any procedures in the area of the spine.       7. Persistent CSF (Cerebro-Spinal Fluid) leakage: This is a rare problem, but may occur  with prolonged intrathecal or epidural catheters either due to the formation of a fistulous  track or a dural tear.       8. Nerve damage: By working so close to the spinal cord, there is always a possibility of  nerve damage, which could be as serious as a permanent spinal cord injury with  paralysis.       9. Death:  Although rare, severe deadly allergic reactions known as "Anaphylactic  reaction" can occur to any of the medications used.      10. Worsening of the symptoms:  We can always make thing worse.  What are the chances of something like this  happening? Chances of any of this occuring are extremely low.  By statistics, you have  more of a chance of getting killed in a motor vehicle accident: while driving to the hospital than any of the above occurring .  Nevertheless, you should be aware that they are possibilities.  In general, it is similar to taking a shower.  Everybody knows that you can slip, hit your head and get killed.  Does that mean that you should not shower again?  Nevertheless always keep in mind that statistics do not mean anything if you happen to be on the wrong side of them.  Even if a procedure has a 1 (one) in a 1,000,000 (million) chance of going wrong, it you happen to be that one..Also, keep in mind that by statistics, you have more of a chance of having something go wrong when taking medications.  Who should not have this procedure? If you are on a blood thinning medication (e.g. Coumadin, Plavix, see list of "Blood Thinners"), or if you have an active infection going on, you should not have the procedure.  If you are taking any blood thinners, please inform your physician.  How should I prepare for this procedure?  Do not eat or drink anything at least six hours prior to the procedure.  Bring a driver with you .  It cannot be a taxi.  Come accompanied by an adult that can drive you back, and that is strong enough to help you if your legs get weak or numb from the local anesthetic.  Take all of your medicines the morning of the procedure with just enough water to swallow them.  If you have diabetes, make sure that you are scheduled to have your procedure done first thing in the morning, whenever possible.  If you have diabetes, take only half of your insulin dose and notify our nurse that you have done so as soon as you arrive at the clinic.  If you are diabetic, but only take blood sugar pills (oral hypoglycemic), then do not take them on the morning of your procedure.  You may take them after you have had the  procedure.  Do not take aspirin or any aspirin-containing medications, at least eleven (11) days prior to the procedure.  They may prolong bleeding.  Wear loose fitting clothing that may be easy to take off and that you would not mind if it got stained with Betadine or blood.  Do not wear any jewelry or perfume  Remove any nail coloring.  It will interfere with some of our monitoring equipment.  NOTE: Remember that this is not meant to be interpreted as a complete list of all possible complications.  Unforeseen problems may occur.  BLOOD THINNERS The following drugs contain aspirin or other products, which can cause increased bleeding during surgery and should not be taken for 2 weeks prior to and 1 week after surgery.  If you should need take something for relief of minor pain, you may take acetaminophen which is found in Tylenol,m Datril, Anacin-3 and Panadol. It is not blood thinner. The products listed below are.  Do not take any of the products listed below in addition to any listed on your instruction sheet.  A.P.C or A.P.C with Codeine Codeine Phosphate Capsules #3 Ibuprofen Ridaura  ABC compound Congesprin Imuran rimadil  Advil Cope Indocin Robaxisal  Alka-Seltzer Effervescent Pain Reliever and Antacid Coricidin or Coricidin-D  Indomethacin Rufen  Alka-Seltzer plus Cold Medicine Cosprin Ketoprofen S-A-C Tablets  Anacin Analgesic Tablets or Capsules Coumadin Korlgesic Salflex  Anacin Extra Strength Analgesic tablets or  capsules CP-2 Tablets Lanoril Salicylate  Anaprox Cuprimine Capsules Levenox Salocol  Anexsia-D Dalteparin Magan Salsalate  Anodynos Darvon compound Magnesium Salicylate Sine-off  Ansaid Dasin Capsules Magsal Sodium Salicylate  Anturane Depen Capsules Marnal Soma  APF Arthritis pain formula Dewitt's Pills Measurin Stanback  Argesic Dia-Gesic Meclofenamic Sulfinpyrazone  Arthritis Bayer Timed Release Aspirin Diclofenac Meclomen Sulindac  Arthritis pain formula  Anacin Dicumarol Medipren Supac  Analgesic (Safety coated) Arthralgen Diffunasal Mefanamic Suprofen  Arthritis Strength Bufferin Dihydrocodeine Mepro Compound Suprol  Arthropan liquid Dopirydamole Methcarbomol with Aspirin Synalgos  ASA tablets/Enseals Disalcid Micrainin Tagament  Ascriptin Doan's Midol Talwin  Ascriptin A/D Dolene Mobidin Tanderil  Ascriptin Extra Strength Dolobid Moblgesic Ticlid  Ascriptin with Codeine Doloprin or Doloprin with Codeine Momentum Tolectin  Asperbuf Duoprin Mono-gesic Trendar  Aspergum Duradyne Motrin or Motrin IB Triminicin  Aspirin plain, buffered or enteric coated Durasal Myochrisine Trigesic  Aspirin Suppositories Easprin Nalfon Trillsate  Aspirin with Codeine Ecotrin Regular or Extra Strength Naprosyn Uracel  Atromid-S Efficin Naproxen Ursinus  Auranofin Capsules Elmiron Neocylate Vanquish  Axotal Emagrin Norgesic Verin  Azathioprine Empirin or Empirin with Codeine Normiflo Vitamin E  Azolid Emprazil Nuprin Voltaren  Bayer Aspirin plain, buffered or children's or timed BC Tablets or powders Encaprin Orgaran Warfarin Sodium  Buff-a-Comp Enoxaparin Orudis Zorpin  Buff-a-Comp with Codeine Equegesic Os-Cal-Gesic   Buffaprin Excedrin plain, buffered or Extra Strength Oxalid   Bufferin Arthritis Strength Feldene Oxphenbutazone   Bufferin plain or Extra Strength Feldene Capsules Oxycodone with Aspirin   Bufferin with Codeine Fenoprofen Fenoprofen Pabalate or Pabalate-SF   Buffets II Flogesic Panagesic   Buffinol plain or Extra Strength Florinal or Florinal with Codeine Panwarfarin   Buf-Tabs Flurbiprofen Penicillamine   Butalbital Compound Four-way cold tablets Penicillin   Butazolidin Fragmin Pepto-Bismol   Carbenicillin Geminisyn Percodan   Carna Arthritis Reliever Geopen Persantine   Carprofen Gold's salt Persistin   Chloramphenicol Goody's Phenylbutazone   Chloromycetin Haltrain Piroxlcam   Clmetidine heparin Plaquenil   Cllnoril Hyco-pap  Ponstel   Clofibrate Hydroxy chloroquine Propoxyphen         Before stopping any of these medications, be sure to consult the physician who ordered them.  Some, such as Coumadin (Warfarin) are ordered to prevent or treat serious conditions such as "deep thrombosis", "pumonary embolisms", and other heart problems.  The amount of time that you may need off of the medication may also vary with the medication and the reason for which you were taking it.  If you are taking any of these medications, please make sure you notify your pain physician before you undergo any procedures.         Sacroiliac (SI) Joint Injection Patient Information  Description: The sacroiliac joint connects the scrum (very low back and tailbone) to the ilium (a pelvic bone which also forms half of the hip joint).  Normally this joint experiences very little motion.  When this joint becomes inflamed or unstable low back and or hip and pelvis pain may result.  Injection of this joint with local anesthetics (numbing medicines) and steroids can provide diagnostic information and reduce pain.  This injection is performed with the aid of x-ray guidance into the tailbone area while you are lying on your stomach.   You may experience an electrical sensation down the leg while this is being done.  You may also experience numbness.  We also may ask if we are reproducing your normal pain during the injection.  Conditions which may be treated SI injection:   Low  back, buttock, hip or leg pain  Preparation for the Injection:  1. Do not eat any solid food or dairy products within 8 hours of your appointment.  2. You may drink clear liquids up to 3 hours before appointment.  Clear liquids include water, black coffee, juice or soda.  No milk or cream please. 3. You may take your regular medications, including pain medications with a sip of water before your appointment.  Diabetics should hold regular insulin (if take separately) and  take 1/2 normal NPH dose the morning of the procedure.  Carry some sugar containing items with you to your appointment. 4. A driver must accompany you and be prepared to drive you home after your procedure. 5. Bring all of your current medications with you. 6. An IV may be inserted and sedation may be given at the discretion of the physician. 7. A blood pressure cuff, EKG and other monitors will often be applied during the procedure.  Some patients may need to have extra oxygen administered for a short period.  8. You will be asked to provide medical information, including your allergies, prior to the procedure.  We must know immediately if you are taking blood thinners (like Coumadin/Warfarin) or if you are allergic to IV iodine contrast (dye).  We must know if you could possible be pregnant.  Possible side effects:   Bleeding from needle site  Infection (rare, may require surgery)  Nerve injury (rare)  Numbness & tingling (temporary)  A brief convulsion or seizure  Light-headedness (temporary)  Pain at injection site (several days)  Decreased blood pressure (temporary)  Weakness in the leg (temporary)   Call if you experience:   New onset weakness or numbness of an extremity below the injection site that last more than 8 hours.  Hives or difficulty breathing ( go to the emergency room)  Inflammation or drainage at the injection site  Any new symptoms which are concerning to you  Please note:  Although the local anesthetic injected can often make your back/ hip/ buttock/ leg feel good for several hours after the injections, the pain will likely return.  It takes 3-7 days for steroids to work in the sacroiliac area.  You may not notice any pain relief for at least that one week.  If effective, we will often do a series of three injections spaced 3-6 weeks apart to maximally decrease your pain.  After the initial series, we generally will wait some months before a repeat  injection of the same type.  If you have any questions, please call [phone] [facility]  Facet Joint Block The facet joints connect the bones of the spine (vertebrae). They make it possible for you to bend, twist, and make other movements with your spine. They also prevent you from overbending, overtwisting, and making other excessive movements.  A facet joint block is a procedure where a numbing medicine (anesthetic) is injected into a facet joint. Often, a type of anti-inflammatory medicine called a steroid is also injected. A facet joint block may be done for two reasons:   Diagnosis. A facet joint block may be done as a test to see whether neck or back pain is caused by a worn-down or infected facet joint. If the pain gets better after a facet joint block, it means the pain is probably coming from the facet joint. If the pain does not get better, it means the pain is probably not coming from the facet  joint.   Therapy. A facet joint block may be done to relieve neck or back pain caused by a facet joint. A facet joint block is only done as a therapy if the pain does not improve with medicine, exercise programs, physical therapy, and other forms of pain management. LET [facility] CARE PROVIDER KNOW ABOUT:   Any allergies you have.   All medicines you are taking, including vitamins, herbs, eyedrops, and over-the-counter medicines and creams.   Previous problems you or members of your family have had with the use of anesthetics.   Any blood disorders you have had.   Other health problems you have. RISKS AND COMPLICATIONS Generally, having a facet joint block is safe. However, as with any procedure, complications can occur. Possible complications associated with having a facet joint block include:   Bleeding.   Injury to a nerve near the injection site.   Pain at the injection site.   Weakness or numbness in areas controlled by nerves  near the injection site.   Infection.   Temporary fluid retention.   Allergic reaction to anesthetics or medicines used during the procedure. BEFORE THE PROCEDURE   Follow your health care provider's instructions if you are taking dietary supplements or medicines. You may need to stop taking them or reduce your dosage.   Do not take any new dietary supplements or medicines without asking your health care provider first.   Follow your health care provider's instructions about eating and drinking before the procedure. You may need to stop eating and drinking several hours before the procedure.   Arrange to have an adult drive you home after the procedure. PROCEDURE  You may need to remove your clothing and dress in an open-back gown so that your health care provider can access your spine.   The procedure will be done while you are lying on an X-ray table. Most of the time you will be asked to lie on your stomach, but you may be asked to lie in a different position if an injection will be made in your neck.   Special machines will be used to monitor your oxygen levels, heart rate, and blood pressure.   If an injection will be made in your neck, an intravenous (IV) tube will be inserted into one of your veins. Fluids and medicine will flow directly into your body through the IV tube.   The area over the facet joint where the injection will be made will be cleaned with an antiseptic soap. The surrounding skin will be covered with sterile drapes.   An anesthetic will be applied to your skin to make the injection area numb. You may feel a temporary stinging or burning sensation.   A video X-ray machine will be used to locate the joint. A contrast dye may be injected into the facet joint area to help with locating the joint.   When the joint is located, an anesthetic medicine will be injected into the joint through the needle.   Your health care provider will ask you whether  you feel pain relief. If you do feel relief, a steroid may be injected to provide pain relief for a longer period of time. If you do not feel relief or feel only partial relief, additional injections of an anesthetic may be made in other facet joints.   The needle will be removed, the skin will be cleansed, and bandages will be applied.  AFTER THE PROCEDURE   You will be observed for  15-30 minutes before being allowed to go home. Do not drive. Have an adult drive you or take a taxi or public transportation instead.   If you feel pain relief, the pain will return in several hours or days when the anesthetic wears off.   You may feel pain relief 2-14 days after the procedure. The amount of time this relief lasts varies from person to person.   It is normal to feel some tenderness over the injected area(s) for 2 days following the procedure.   If you have diabetes, you may have a temporary increase in blood sugar. This information is not intended to replace advice given to you by your health care provider. Make sure you discuss any questions you have with your health care provider. Document Released: 06/09/2006 Document Revised: 02/08/2014 Document Reviewed: 10/14/2014 [employer] Interactive Patient Education  2017 [employer].

## 2016-03-03 ENCOUNTER — Ambulatory Visit: Payer: PPO | Attending: Pain Medicine | Admitting: Pain Medicine

## 2016-03-03 ENCOUNTER — Encounter: Payer: Self-pay | Admitting: Pain Medicine

## 2016-03-03 VITALS — BP 151/78 | HR 81 | Temp 98.4°F | Resp 16 | Ht 74.0 in | Wt 175.0 lb

## 2016-03-03 DIAGNOSIS — M5442 Lumbago with sciatica, left side: Secondary | ICD-10-CM | POA: Diagnosis not present

## 2016-03-03 DIAGNOSIS — M545 Low back pain: Secondary | ICD-10-CM | POA: Insufficient documentation

## 2016-03-03 DIAGNOSIS — E876 Hypokalemia: Secondary | ICD-10-CM | POA: Diagnosis not present

## 2016-03-03 DIAGNOSIS — I2510 Atherosclerotic heart disease of native coronary artery without angina pectoris: Secondary | ICD-10-CM | POA: Insufficient documentation

## 2016-03-03 DIAGNOSIS — M5441 Lumbago with sciatica, right side: Secondary | ICD-10-CM

## 2016-03-03 DIAGNOSIS — F1190 Opioid use, unspecified, uncomplicated: Secondary | ICD-10-CM

## 2016-03-03 DIAGNOSIS — M47816 Spondylosis without myelopathy or radiculopathy, lumbar region: Secondary | ICD-10-CM

## 2016-03-03 DIAGNOSIS — F17200 Nicotine dependence, unspecified, uncomplicated: Secondary | ICD-10-CM | POA: Diagnosis not present

## 2016-03-03 DIAGNOSIS — M25551 Pain in right hip: Secondary | ICD-10-CM | POA: Diagnosis not present

## 2016-03-03 DIAGNOSIS — M961 Postlaminectomy syndrome, not elsewhere classified: Secondary | ICD-10-CM | POA: Insufficient documentation

## 2016-03-03 DIAGNOSIS — Z7982 Long term (current) use of aspirin: Secondary | ICD-10-CM | POA: Diagnosis not present

## 2016-03-03 DIAGNOSIS — M5136 Other intervertebral disc degeneration, lumbar region: Secondary | ICD-10-CM | POA: Diagnosis not present

## 2016-03-03 DIAGNOSIS — E7800 Pure hypercholesterolemia, unspecified: Secondary | ICD-10-CM | POA: Diagnosis not present

## 2016-03-03 DIAGNOSIS — Z9049 Acquired absence of other specified parts of digestive tract: Secondary | ICD-10-CM | POA: Insufficient documentation

## 2016-03-03 DIAGNOSIS — M1288 Other specific arthropathies, not elsewhere classified, other specified site: Secondary | ICD-10-CM | POA: Diagnosis not present

## 2016-03-03 DIAGNOSIS — M25552 Pain in left hip: Secondary | ICD-10-CM | POA: Diagnosis not present

## 2016-03-03 DIAGNOSIS — Z8249 Family history of ischemic heart disease and other diseases of the circulatory system: Secondary | ICD-10-CM | POA: Diagnosis not present

## 2016-03-03 DIAGNOSIS — Z86718 Personal history of other venous thrombosis and embolism: Secondary | ICD-10-CM | POA: Diagnosis not present

## 2016-03-03 DIAGNOSIS — G8929 Other chronic pain: Secondary | ICD-10-CM

## 2016-03-03 DIAGNOSIS — I714 Abdominal aortic aneurysm, without rupture: Secondary | ICD-10-CM | POA: Insufficient documentation

## 2016-03-03 DIAGNOSIS — G894 Chronic pain syndrome: Secondary | ICD-10-CM

## 2016-03-03 DIAGNOSIS — Z820 Family history of epilepsy and other diseases of the nervous system: Secondary | ICD-10-CM | POA: Diagnosis not present

## 2016-03-03 DIAGNOSIS — M533 Sacrococcygeal disorders, not elsewhere classified: Secondary | ICD-10-CM | POA: Diagnosis not present

## 2016-03-03 DIAGNOSIS — C61 Malignant neoplasm of prostate: Secondary | ICD-10-CM | POA: Diagnosis not present

## 2016-03-03 DIAGNOSIS — Z79891 Long term (current) use of opiate analgesic: Secondary | ICD-10-CM | POA: Insufficient documentation

## 2016-03-03 DIAGNOSIS — Z981 Arthrodesis status: Secondary | ICD-10-CM | POA: Insufficient documentation

## 2016-03-03 MED ORDER — OXYCODONE HCL 5 MG PO TABS: 5.0000 mg | ORAL_TABLET | Freq: Two times a day (BID) | ORAL | 0 refills | Status: DC

## 2016-03-03 MED ORDER — OXYCODONE HCL 5 MG PO TABS: 5.0000 mg | ORAL_TABLET | Freq: Every day | ORAL | 0 refills | Status: DC

## 2016-03-03 MED ORDER — OXYCODONE HCL 5 MG PO TABS: 5.0000 mg | ORAL_TABLET | Freq: Three times a day (TID) | ORAL | 0 refills | Status: DC

## 2016-03-03 MED ORDER — OXYCODONE HCL 20 MG PO TABS: 20.0000 mg | ORAL_TABLET | Freq: Every day | ORAL | 0 refills | Status: DC

## 2016-03-03 NOTE — Progress Notes (Signed)
Safety precautions to be maintained throughout the outpatient stay will include: orient to surroundings, keep bed in low position, maintain call bell within reach at all times, provide assistance with transfer out of bed and ambulation.  

## 2016-03-03 NOTE — Patient Instructions (Signed)
GENERAL RISKS AND COMPLICATIONS  What are the risk, side effects and possible complications? Generally speaking, most procedures are safe.  However, with any procedure there are risks, side effects, and the possibility of complications.  The risks and complications are dependent upon the sites that are lesioned, or the type of nerve block to be performed.  The closer the procedure is to the spine, the more serious the risks are.  Great care is taken when placing the radio frequency needles, block needles or lesioning probes, but sometimes complications can occur. 1. Infection: Any time there is an injection through the skin, there is a risk of infection.  This is why sterile conditions are used for these blocks.  There are four possible types of infection. 1. Localized skin infection. 2. Central Nervous System Infection-This can be in the form of Meningitis, which can be deadly. 3. Epidural Infections-This can be in the form of an epidural abscess, which can cause pressure inside of the spine, causing compression of the spinal cord with subsequent paralysis. This would require an emergency surgery to decompress, and there are no guarantees that the patient would recover from the paralysis. 4. Discitis-This is an infection of the intervertebral discs.  It occurs in about 1% of discography procedures.  It is difficult to treat and it may lead to surgery.        2. Pain: the needles have to go through skin and soft tissues, will cause soreness.       3. Damage to internal structures:  The nerves to be lesioned may be near blood vessels or    other nerves which can be potentially damaged.       4. Bleeding: Bleeding is more common if the patient is taking blood thinners such as  aspirin, Coumadin, Ticiid, Plavix, etc., or if he/she have some genetic predisposition  such as hemophilia. Bleeding into the spinal canal can cause compression of the spinal  cord with subsequent paralysis.  This would require an  emergency surgery to  decompress and there are no guarantees that the patient would recover from the  paralysis.       5. Pneumothorax:  Puncturing of a lung is a possibility, every time a needle is introduced in  the area of the chest or upper back.  Pneumothorax refers to free air around the  collapsed lung(s), inside of the thoracic cavity (chest cavity).  Another two possible  complications related to a similar event would include: Hemothorax and Chylothorax.   These are variations of the Pneumothorax, where instead of air around the collapsed  lung(s), you may have blood or chyle, respectively.       6. Spinal headaches: They may occur with any procedures in the area of the spine.       7. Persistent CSF (Cerebro-Spinal Fluid) leakage: This is a rare problem, but may occur  with prolonged intrathecal or epidural catheters either due to the formation of a fistulous  track or a dural tear.       8. Nerve damage: By working so close to the spinal cord, there is always a possibility of  nerve damage, which could be as serious as a permanent spinal cord injury with  paralysis.       9. Death:  Although rare, severe deadly allergic reactions known as "Anaphylactic  reaction" can occur to any of the medications used.      10. Worsening of the symptoms:  We can always make thing worse.    What are the chances of something like this happening? Chances of any of this occuring are extremely low.  By statistics, you have more of a chance of getting killed in a motor vehicle accident: while driving to the hospital than any of the above occurring .  Nevertheless, you should be aware that they are possibilities.  In general, it is similar to taking a shower.  Everybody knows that you can slip, hit your head and get killed.  Does that mean that you should not shower again?  Nevertheless always keep in mind that statistics do not mean anything if you happen to be on the wrong side of them.  Even if a procedure has a 1  (one) in a 1,000,000 (million) chance of going wrong, it you happen to be that one..Also, keep in mind that by statistics, you have more of a chance of having something go wrong when taking medications.  Who should not have this procedure? If you are on a blood thinning medication (e.g. Coumadin, Plavix, see list of "Blood Thinners"), or if you have an active infection going on, you should not have the procedure.  If you are taking any blood thinners, please inform your physician.  How should I prepare for this procedure?  Do not eat or drink anything at least six hours prior to the procedure.  Bring a driver with you .  It cannot be a taxi.  Come accompanied by an adult that can drive you back, and that is strong enough to help you if your legs get weak or numb from the local anesthetic.  Take all of your medicines the morning of the procedure with just enough water to swallow them.  If you have diabetes, make sure that you are scheduled to have your procedure done first thing in the morning, whenever possible.  If you have diabetes, take only half of your insulin dose and notify our nurse that you have done so as soon as you arrive at the clinic.  If you are diabetic, but only take blood sugar pills (oral hypoglycemic), then do not take them on the morning of your procedure.  You may take them after you have had the procedure.  Do not take aspirin or any aspirin-containing medications, at least eleven (11) days prior to the procedure.  They may prolong bleeding.  Wear loose fitting clothing that may be easy to take off and that you would not mind if it got stained with Betadine or blood.  Do not wear any jewelry or perfume  Remove any nail coloring.  It will interfere with some of our monitoring equipment.  NOTE: Remember that this is not meant to be interpreted as a complete list of all possible complications.  Unforeseen problems may occur.  BLOOD THINNERS The following drugs  contain aspirin or other products, which can cause increased bleeding during surgery and should not be taken for 2 weeks prior to and 1 week after surgery.  If you should need take something for relief of minor pain, you may take acetaminophen which is found in Tylenol,m Datril, Anacin-3 and Panadol. It is not blood thinner. The products listed below are.  Do not take any of the products listed below in addition to any listed on your instruction sheet.  A.P.C or A.P.C with Codeine Codeine Phosphate Capsules #3 Ibuprofen Ridaura  ABC compound Congesprin Imuran rimadil  Advil Cope Indocin Robaxisal  Alka-Seltzer Effervescent Pain Reliever and Antacid Coricidin or Coricidin-D  Indomethacin Rufen    Alka-Seltzer plus Cold Medicine Cosprin Ketoprofen S-A-C Tablets  Anacin Analgesic Tablets or Capsules Coumadin Korlgesic Salflex  Anacin Extra Strength Analgesic tablets or capsules CP-2 Tablets Lanoril Salicylate  Anaprox Cuprimine Capsules Levenox Salocol  Anexsia-D Dalteparin Magan Salsalate  Anodynos Darvon compound Magnesium Salicylate Sine-off  Ansaid Dasin Capsules Magsal Sodium Salicylate  Anturane Depen Capsules Marnal Soma  APF Arthritis pain formula Dewitt's Pills Measurin Stanback  Argesic Dia-Gesic Meclofenamic Sulfinpyrazone  Arthritis Bayer Timed Release Aspirin Diclofenac Meclomen Sulindac  Arthritis pain formula Anacin Dicumarol Medipren Supac  Analgesic (Safety coated) Arthralgen Diffunasal Mefanamic Suprofen  Arthritis Strength Bufferin Dihydrocodeine Mepro Compound Suprol  Arthropan liquid Dopirydamole Methcarbomol with Aspirin Synalgos  ASA tablets/Enseals Disalcid Micrainin Tagament  Ascriptin Doan's Midol Talwin  Ascriptin A/D Dolene Mobidin Tanderil  Ascriptin Extra Strength Dolobid Moblgesic Ticlid  Ascriptin with Codeine Doloprin or Doloprin with Codeine Momentum Tolectin  Asperbuf Duoprin Mono-gesic Trendar  Aspergum Duradyne Motrin or Motrin IB Triminicin  Aspirin  plain, buffered or enteric coated Durasal Myochrisine Trigesic  Aspirin Suppositories Easprin Nalfon Trillsate  Aspirin with Codeine Ecotrin Regular or Extra Strength Naprosyn Uracel  Atromid-S Efficin Naproxen Ursinus  Auranofin Capsules Elmiron Neocylate Vanquish  Axotal Emagrin Norgesic Verin  Azathioprine Empirin or Empirin with Codeine Normiflo Vitamin E  Azolid Emprazil Nuprin Voltaren  Bayer Aspirin plain, buffered or children's or timed BC Tablets or powders Encaprin Orgaran Warfarin Sodium  Buff-a-Comp Enoxaparin Orudis Zorpin  Buff-a-Comp with Codeine Equegesic Os-Cal-Gesic   Buffaprin Excedrin plain, buffered or Extra Strength Oxalid   Bufferin Arthritis Strength Feldene Oxphenbutazone   Bufferin plain or Extra Strength Feldene Capsules Oxycodone with Aspirin   Bufferin with Codeine Fenoprofen Fenoprofen Pabalate or Pabalate-SF   Buffets II Flogesic Panagesic   Buffinol plain or Extra Strength Florinal or Florinal with Codeine Panwarfarin   Buf-Tabs Flurbiprofen Penicillamine   Butalbital Compound Four-way cold tablets Penicillin   Butazolidin Fragmin Pepto-Bismol   Carbenicillin Geminisyn Percodan   Carna Arthritis Reliever Geopen Persantine   Carprofen Gold's salt Persistin   Chloramphenicol Goody's Phenylbutazone   Chloromycetin Haltrain Piroxlcam   Clmetidine heparin Plaquenil   Cllnoril Hyco-pap Ponstel   Clofibrate Hydroxy chloroquine Propoxyphen         Before stopping any of these medications, be sure to consult the physician who ordered them.  Some, such as Coumadin (Warfarin) are ordered to prevent or treat serious conditions such as "deep thrombosis", "pumonary embolisms", and other heart problems.  The amount of time that you may need off of the medication may also vary with the medication and the reason for which you were taking it.  If you are taking any of these medications, please make sure you notify your pain physician before you undergo any  procedures.         Sacroiliac (SI) Joint Injection Patient Information  Description: The sacroiliac joint connects the scrum (very low back and tailbone) to the ilium (a pelvic bone which also forms half of the hip joint).  Normally this joint experiences very little motion.  When this joint becomes inflamed or unstable low back and or hip and pelvis pain may result.  Injection of this joint with local anesthetics (numbing medicines) and steroids can provide diagnostic information and reduce pain.  This injection is performed with the aid of x-ray guidance into the tailbone area while you are lying on your stomach.   You may experience an electrical sensation down the leg while this is being done.  You may also experience numbness.  We   also may ask if we are reproducing your normal pain during the injection.  Conditions which may be treated SI injection:   Low back, buttock, hip or leg pain  Preparation for the Injection:  1. Do not eat any solid food or dairy products within 8 hours of your appointment.  2. You may drink clear liquids up to 3 hours before appointment.  Clear liquids include water, black coffee, juice or soda.  No milk or cream please. 3. You may take your regular medications, including pain medications with a sip of water before your appointment.  Diabetics should hold regular insulin (if take separately) and take 1/2 normal NPH dose the morning of the procedure.  Carry some sugar containing items with you to your appointment. 4. A driver must accompany you and be prepared to drive you home after your procedure. 5. Bring all of your current medications with you. 6. An IV may be inserted and sedation may be given at the discretion of the physician. 7. A blood pressure cuff, EKG and other monitors will often be applied during the procedure.  Some patients may need to have extra oxygen administered for a short period.  8. You will be asked to provide medical information,  including your allergies, prior to the procedure.  We must know immediately if you are taking blood thinners (like Coumadin/Warfarin) or if you are allergic to IV iodine contrast (dye).  We must know if you could possible be pregnant.  Possible side effects:   Bleeding from needle site  Infection (rare, may require surgery)  Nerve injury (rare)  Numbness & tingling (temporary)  A brief convulsion or seizure  Light-headedness (temporary)  Pain at injection site (several days)  Decreased blood pressure (temporary)  Weakness in the leg (temporary)   Call if you experience:   New onset weakness or numbness of an extremity below the injection site that last more than 8 hours.  Hives or difficulty breathing ( go to the emergency room)  Inflammation or drainage at the injection site  Any new symptoms which are concerning to you  Please note:  Although the local anesthetic injected can often make your back/ hip/ buttock/ leg feel good for several hours after the injections, the pain will likely return.  It takes 3-7 days for steroids to work in the sacroiliac area.  You may not notice any pain relief for at least that one week.  If effective, we will often do a series of three injections spaced 3-6 weeks apart to maximally decrease your pain.  After the initial series, we generally will wait some months before a repeat injection of the same type.  If you have any questions, please call [phone] [facility]  Facet Joint Block The facet joints connect the bones of the spine (vertebrae). They make it possible for you to bend, twist, and make other movements with your spine. They also prevent you from overbending, overtwisting, and making other excessive movements.  A facet joint block is a procedure where a numbing medicine (anesthetic) is injected into a facet joint. Often, a type of anti-inflammatory medicine called a steroid is also  injected. A facet joint block may be done for two reasons:   Diagnosis. A facet joint block may be done as a test to see whether neck or back pain is caused by a worn-down or infected facet joint. If the pain gets better after a facet joint block, it means the pain is  probably coming from the facet joint. If the pain does not get better, it means the pain is probably not coming from the facet joint.   Therapy. A facet joint block may be done to relieve neck or back pain caused by a facet joint. A facet joint block is only done as a therapy if the pain does not improve with medicine, exercise programs, physical therapy, and other forms of pain management. LET [facility] CARE PROVIDER KNOW ABOUT:   Any allergies you have.   All medicines you are taking, including vitamins, herbs, eyedrops, and over-the-counter medicines and creams.   Previous problems you or members of your family have had with the use of anesthetics.   Any blood disorders you have had.   Other health problems you have. RISKS AND COMPLICATIONS Generally, having a facet joint block is safe. However, as with any procedure, complications can occur. Possible complications associated with having a facet joint block include:   Bleeding.   Injury to a nerve near the injection site.   Pain at the injection site.   Weakness or numbness in areas controlled by nerves near the injection site.   Infection.   Temporary fluid retention.   Allergic reaction to anesthetics or medicines used during the procedure. BEFORE THE PROCEDURE   Follow your health care provider's instructions if you are taking dietary supplements or medicines. You may need to stop taking them or reduce your dosage.   Do not take any new dietary supplements or medicines without asking your health care provider first.   Follow your health care provider's instructions about eating and drinking before the procedure. You may need to stop eating and  drinking several hours before the procedure.   Arrange to have an adult drive you home after the procedure. PROCEDURE  You may need to remove your clothing and dress in an open-back gown so that your health care provider can access your spine.   The procedure will be done while you are lying on an X-ray table. Most of the time you will be asked to lie on your stomach, but you may be asked to lie in a different position if an injection will be made in your neck.   Special machines will be used to monitor your oxygen levels, heart rate, and blood pressure.   If an injection will be made in your neck, an intravenous (IV) tube will be inserted into one of your veins. Fluids and medicine will flow directly into your body through the IV tube.   The area over the facet joint where the injection will be made will be cleaned with an antiseptic soap. The surrounding skin will be covered with sterile drapes.   An anesthetic will be applied to your skin to make the injection area numb. You may feel a temporary stinging or burning sensation.   A video X-ray machine will be used to locate the joint. A contrast dye may be injected into the facet joint area to help with locating the joint.   When the joint is located, an anesthetic medicine will be injected into the joint through the needle.   Your health care provider will ask you whether you feel pain relief. If you do feel relief, a steroid may be injected to provide pain relief for a longer period of time. If you do not feel relief or feel only partial relief, additional injections of an anesthetic may be made in other facet joints.   The needle will  be removed, the skin will be cleansed, and bandages will be applied.  AFTER THE PROCEDURE   You will be observed for 15-30 minutes before being allowed to go home. Do not drive. Have an adult drive you or take a taxi or public transportation instead.   If you feel pain relief, the pain will  return in several hours or days when the anesthetic wears off.   You may feel pain relief 2-14 days after the procedure. The amount of time this relief lasts varies from person to person.   It is normal to feel some tenderness over the injected area(s) for 2 days following the procedure.   If you have diabetes, you may have a temporary increase in blood sugar. This information is not intended to replace advice given to you by your health care provider. Make sure you discuss any questions you have with your health care provider. Document Released: 06/09/2006 Document Revised: 02/08/2014 Document Reviewed: 10/14/2014 Elsevier Interactive Patient Education  2017 [employer].

## 2016-03-11 ENCOUNTER — Telehealth: Payer: Self-pay | Admitting: Pain Medicine

## 2016-03-11 NOTE — Telephone Encounter (Signed)
Notified patient per his voicemail that he must turn the prescription in to the pharmacy and they will notify us that a PA needs to be done.

## 2016-03-11 NOTE — Telephone Encounter (Signed)
Called pharmacy, he did get the Oxycodone 20 mg, but did not get the Oxycodone 5 mg. They were unsure why.

## 2016-03-11 NOTE — Telephone Encounter (Signed)
Patient called stating he could only get 20mg  meds filled as insurance would not authorize the 5mg . Pharmacy is [facility] in [place]. Please call pharmacy to find out what is needed.

## 2016-03-15 ENCOUNTER — Encounter: Payer: Self-pay | Admitting: Family Medicine

## 2016-03-15 ENCOUNTER — Ambulatory Visit (INDEPENDENT_AMBULATORY_CARE_PROVIDER_SITE_OTHER): Payer: PPO | Admitting: Family Medicine

## 2016-03-15 VITALS — BP 102/68 | HR 84 | Temp 97.7°F | Resp 16 | Ht 73.0 in | Wt 182.0 lb

## 2016-03-15 DIAGNOSIS — I10 Essential (primary) hypertension: Secondary | ICD-10-CM | POA: Diagnosis not present

## 2016-03-15 DIAGNOSIS — M5442 Lumbago with sciatica, left side: Secondary | ICD-10-CM

## 2016-03-15 DIAGNOSIS — I779 Disorder of arteries and arterioles, unspecified: Secondary | ICD-10-CM

## 2016-03-15 DIAGNOSIS — E7800 Pure hypercholesterolemia, unspecified: Secondary | ICD-10-CM

## 2016-03-15 DIAGNOSIS — I714 Abdominal aortic aneurysm, without rupture, unspecified: Secondary | ICD-10-CM

## 2016-03-15 DIAGNOSIS — M5441 Lumbago with sciatica, right side: Secondary | ICD-10-CM

## 2016-03-15 DIAGNOSIS — F17210 Nicotine dependence, cigarettes, uncomplicated: Secondary | ICD-10-CM | POA: Diagnosis not present

## 2016-03-15 DIAGNOSIS — Z8546 Personal history of malignant neoplasm of prostate: Secondary | ICD-10-CM | POA: Diagnosis not present

## 2016-03-15 DIAGNOSIS — Z0000 Encounter for general adult medical examination without abnormal findings: Secondary | ICD-10-CM | POA: Diagnosis not present

## 2016-03-15 DIAGNOSIS — I739 Peripheral vascular disease, unspecified: Secondary | ICD-10-CM

## 2016-03-15 DIAGNOSIS — G8929 Other chronic pain: Secondary | ICD-10-CM

## 2016-03-15 DIAGNOSIS — Z1159 Encounter for screening for other viral diseases: Secondary | ICD-10-CM | POA: Diagnosis not present

## 2016-03-15 NOTE — Patient Instructions (Addendum)
Check with your pharmacist about the new shingles vaccine (Shingrix)    Preventive Care 70 Years and Older, Male Preventive care refers to lifestyle choices and visits with your health care provider that can promote health and wellness. What does preventive care include? A yearly physical exam. This is also called an annual well check. Dental exams once or twice a year. Routine eye exams. Ask your health care provider how often you should have your eyes checked. Personal lifestyle choices, including: Daily care of your teeth and gums. Regular physical activity. Eating a healthy diet. Avoiding tobacco and drug use. Limiting alcohol use. Practicing safe sex. Taking low doses of aspirin every day. Taking vitamin and mineral supplements as recommended by your health care provider. What happens during an annual well check? The services and screenings done by your health care provider during your annual well check will depend on your age, overall health, lifestyle risk factors, and family history of disease. Counseling  Your health care provider may ask you questions about your: Alcohol use. Tobacco use. Drug use. Emotional well-being. Home and relationship well-being. Sexual activity. Eating habits. History of falls. Memory and ability to understand (cognition). Work and work Statistician. Screening  You may have the following tests or measurements: Height, weight, and BMI. Blood pressure. Lipid and cholesterol levels. These may be checked every 5 years, or more frequently if you are over 70 years old. Skin check. Lung cancer screening. You may have this screening every year starting at age 70 if you have a 30-pack-year history of smoking and currently smoke or have quit within the past 15 years. Fecal occult blood test (FOBT) of the stool. You may have this test every year starting at age 70. Flexible sigmoidoscopy or colonoscopy. You may have a sigmoidoscopy every 5 years or a  colonoscopy every 10 years starting at age 70. Prostate cancer screening. Recommendations will vary depending on your family history and other risks. Hepatitis C blood test. Hepatitis B blood test. Sexually transmitted disease (STD) testing. Diabetes screening. This is done by checking your blood sugar (glucose) after you have not eaten for a while (fasting). You may have this done every 1-3 years. Abdominal aortic aneurysm (AAA) screening. You may need this if you are a current or former smoker. Osteoporosis. You may be screened starting at age 20 if you are at high risk. Talk with your health care provider about your test results, treatment options, and if necessary, the need for more tests. Vaccines  Your health care provider may recommend certain vaccines, such as: Influenza vaccine. This is recommended every year. Tetanus, diphtheria, and acellular pertussis (Tdap, Td) vaccine. You may need a Td booster every 10 years. Varicella vaccine. You may need this if you have not been vaccinated. Zoster vaccine. You may need this after age 70. Measles, mumps, and rubella (MMR) vaccine. You may need at least one dose of MMR if you were born in 1957 or later. You may also need a second dose. Pneumococcal 13-valent conjugate (PCV13) vaccine. One dose is recommended after age 24. Pneumococcal polysaccharide (PPSV23) vaccine. One dose is recommended after age 31. Meningococcal vaccine. You may need this if you have certain conditions. Hepatitis A vaccine. You may need this if you have certain conditions or if you travel or work in places where you may be exposed to hepatitis A. Hepatitis B vaccine. You may need this if you have certain conditions or if you travel or work in places where you may be  exposed to hepatitis B. Haemophilus influenzae type b (Hib) vaccine. You may need this if you have certain risk factors. Talk to your health care provider about which screenings and vaccines you need and how  often you need them. This information is not intended to replace advice given to you by your health care provider. Make sure you discuss any questions you have with your health care provider. Document Released: 02/14/2015 Document Revised: 10/08/2015 Document Reviewed: 11/19/2014 Elsevier Interactive Patient Education  2017 [employer].

## 2016-03-15 NOTE — Progress Notes (Signed)
Patient: Justin Harrington, Male    DOB: Apr 18, 1945, 71 y.o.   MRN: [id number] Visit Date: 03/15/2016  Today's Provider: [name], MD   Chief Complaint  Patient presents with  . Annual Exam  . Hypertension  . Hyperlipidemia   Subjective:    Annual wellness visit Justin Harrington is a 71 y.o. male. He feels fairly well. He reports exercising daily. He reports he is sleeping poorly.  ----------------------------------------------------------- Follow up Hypokalemia:  Patient was last seen for this problem 3 months ago. During that visit, labs were obtained potassium levels were normal.    Follow up Hypomagnesemia:  Patient was last seen for this problem 3 months ago. Labs were ordered showing high magnesium levels. Patient was advised to reduce magnesium supplements to 1 pill daily of 500mg . Patient is currently not taking any magnesium supplements.    Follow up Hypertension Is doing well hctz with no adverse effects. No checking home blood pressure. No chest pains or heart flutters.   Follow up hyperlipidemia.  Taking atorvastatin every day which he is tolerating well. Continues annual follow up with Dr. Lucky Cowboy regarding AAA  Carotid arteries.. Was last seen November 2017.    Review of Systems  Constitutional: Negative for appetite change, chills, fatigue and fever.  HENT: Negative for congestion, ear pain, hearing loss, nosebleeds and trouble swallowing.   Eyes: Negative for pain and visual disturbance.  Respiratory: Negative for cough, chest tightness and shortness of breath.   Cardiovascular: Negative for chest pain, palpitations and leg swelling.  Gastrointestinal: Negative for abdominal pain, blood in stool, constipation, diarrhea, nausea and vomiting.  Endocrine: Negative for polydipsia, polyphagia and polyuria.  Genitourinary: Negative for dysuria and flank pain.  Musculoskeletal: Positive for back pain. Negative for arthralgias, joint swelling, myalgias  and neck stiffness.  Skin: Negative for color change, rash and wound.  Neurological: Negative for dizziness, tremors, seizures, speech difficulty, weakness, light-headedness and headaches.  Psychiatric/Behavioral: Negative for behavioral problems, confusion, decreased concentration, dysphoric mood and sleep disturbance. The patient is not nervous/anxious.   All other systems reviewed and are negative.   Social History   Social History  . Marital status: Married    Spouse name: N/A  . Number of children: N/A  . Years of education: HS Diploma   Occupational History  . Retired    Social History Main Topics  . Smoking status: Current Every Day Smoker    Packs/day: 0.75    Years: 50.00    Types: Cigarettes  . Smokeless tobacco: Never Used  . Alcohol use No  . Drug use: No  . Sexual activity: Not on file   Other Topics Concern  . Not on file   Social History Narrative  . No narrative on file    Past Medical History:  Diagnosis Date  . Aortic aneurysm ([place])   . CAD (coronary artery disease)   . DDD (degenerative disc disease), lumbar   . Hyperlipidemia   . Personal history of tobacco use, presenting hazards to health 06/12/2015  . Personal history of tobacco use, presenting hazards to health 06/12/2015  . Prostate cancer [facility])      Patient Active Problem List   Diagnosis Date Noted  . Urinary hesitancy 01/01/2016  . H/O prostatectomy 01/01/2016  . Bilateral carotid artery stenosis 12/17/2015  . AAA (abdominal aortic aneurysm) without rupture ([place]) 12/17/2015  . Hypokalemia 12/10/2015  . Hypomagnesemia 12/10/2015  . Chronic sacroiliac joint pain (Bilateral) (R>L) 12/10/2015  . Chronic  hip pain (Bilateral) (R>L) 12/10/2015  . Chronic pain syndrome 12/10/2015  . Long term current use of opiate analgesic 10/16/2015  . Long term prescription opiate use 10/16/2015  . Opiate use (180 MME/Day) 10/16/2015  . Encounter for therapeutic drug level monitoring 10/16/2015  .  Chronic low back pain (Location of Primary Source of Pain) (Bilateral) (R>L) 10/16/2015  . 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 History of lumbar fusion (L4-S1 posterior hardware) 10/16/2015  . Lumbar spondylosis 10/16/2015  . Lumbar facet syndrome (Bilateral) (R>L) 10/16/2015  . Opioid-induced constipation (OIC) 06/05/2015  . Cataract of left eye 09/03/2014  . Elevated PSA 09/03/2014  . History of DVT (deep vein thrombosis) 09/03/2014  . Prostate cancer ([place]) 09/03/2014  . Hypercholesteremia 07/24/2014  . Hypertension 07/24/2014  . Malignant neoplasm of prostate ([place]) 07/28/2012  . Carotid arterial disease ([place]) 09/02/2010  . Diverticulosis of colon without hemorrhage 07/11/2006  . Insomnia 06/29/2006  . Tobacco abuse 02/01/2006  . Hyperlipidemia 02/01/1998  . Arthrodesis status 02/02/1996    Past Surgical History:  Procedure Laterality Date  . BACK SURGERY  1998   Lumbar spine fusion  . carotid doppler ultrasound  10/05/2011   75-90% RCA occlusion, 50% on left  . CAROTID ENDARTERECTOMY Right 12/02/2011   Dr. Lucky Cowboy  . CHOLECYSTECTOMY  10/02/2010   Laparoscopic, Dr. Pat Patrick, [facility]  . 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 Cholelithiasis and acute cholecystiis. Mild enlargement of left adrenal gland  . PROSTATE SURGERY  10/19/2012   prostatectomy. [facility]; Laser assisted, Done by [name] for prostate cancer    His family history includes Cerebral palsy in his son; Congestive Heart Failure in his father; Heart attack in his mother.      Current Outpatient Prescriptions:  .  aspirin EC 81 MG tablet, Take 1 tablet by mouth daily., Disp: , Rfl:  .  atorvastatin (LIPITOR) 80 MG tablet, Take 1 tablet (80 mg total) by mouth daily., Disp: 90 tablet, Rfl: 4 .  hydrochlorothiazide (HYDRODIURIL) 25 MG tablet, Take 1 tablet (25 mg total) by mouth daily., Disp: 90 tablet, Rfl: 4 .  lubiprostone  (AMITIZA) 24 MCG capsule, Take 1 capsule (24 mcg total) by mouth 2 (two) times daily with a meal. Swallow the medication whole. Do not break or chew the medication., Disp: 60 capsule, Rfl: PRN .  naloxone (NARCAN) 2 MG/2ML injection, , Disp: , Rfl: 1 .  oxyCODONE (OXY IR/ROXICODONE) 5 MG immediate release tablet, Take 1 tablet (5 mg total) by mouth 3 (three) times daily. Max: 3/day, Disp: 21 tablet, Rfl: 0 .  [START ON 03/17/2016] oxyCODONE (OXY IR/ROXICODONE) 5 MG immediate release tablet, Take 1 tablet (5 mg total) by mouth 2 (two) times daily. Max: 2/day, Disp: 14 tablet, Rfl: 0 .  [START ON 03/24/2016] oxyCODONE (OXY IR/ROXICODONE) 5 MG immediate release tablet, Take 1 tablet (5 mg total) by mouth daily. Max: 1/day, Disp: 7 tablet, Rfl: 0 .  oxycodone (ROXICODONE) 30 MG immediate release tablet, One every four to six hours as needed, Disp: 120 tablet, Rfl: 0 .  Oxycodone HCl 20 MG TABS, Take 1 tablet (20 mg total) by mouth 5 (five) times daily., Disp: 140 tablet, Rfl: 0 .  traZODone (DESYREL) 150 MG tablet, Take 1 tablet (150 mg total) by mouth at bedtime., Disp: 90 tablet, Rfl: 3 .  Wheat Dextrin (BENEFIBER) POWD, Stir 2 tsp. TID into 4-8 oz of any non-carbonated beverage or soft  food (hot or cold), Disp: 500 g, Rfl: PRN  Patient Care Team: [name], MD as PCP - General (Family Medicine) Murrell Redden, MD (Urology)     Objective:   Vitals: BP 102/68 (BP Location: Right Arm, Patient Position: Sitting, Cuff Size: Large)   Pulse 84   Temp 97.7 F (36.5 C) (Oral)   Resp 16   Ht 6\' 1"  (1.854 m)   Wt 182 lb (82.6 kg)   SpO2 95% Comment: room air  BMI 24.01 kg/m   Physical Exam   General Appearance:    Alert, cooperative, no distress, appears stated age  Head:    Normocephalic, without obvious abnormality, atraumatic  Eyes:    PERRL, conjunctiva/corneas clear, EOM's intact, fundi    benign, both eyes       Ears:    Normal TM's and external ear canals, both ears  Nose:   Nares  normal, septum midline, mucosa normal, no drainage   or sinus tenderness  Throat:   Lips, mucosa, and tongue normal; teeth and gums normal  Neck:   Supple, symmetrical, trachea midline, no adenopathy;       thyroid:  No enlargement/tenderness/nodules; no carotid   bruit or JVD  Back:     Symmetric, no curvature, ROM normal, no CVA tenderness  Lungs:     Clear to auscultation bilaterally, respirations unlabored  Chest wall:    No tenderness or deformity  Heart:    Regular rate and rhythm, S1 and S2 normal, no murmur, rub   or gallop  Abdomen:     Soft, non-tender, bowel sounds active all four quadrants,    no masses, no organomegaly  Genitalia:    deferred  Rectal:    deferred  Extremities:   Extremities normal, atraumatic, no cyanosis or edema  Pulses:   2+ and symmetric all extremities  Skin:   Skin color, texture, turgor normal, no rashes or lesions  Lymph nodes:   Cervical, supraclavicular, and axillary nodes normal  Neurologic:   CNII-XII intact. Normal strength, sensation and reflexes      throughout    Activities of Daily Living In your present state of health, do you have any difficulty performing the following activities: 03/15/2016  Hearing? N  Vision? N  Difficulty concentrating or making decisions? N  Walking or climbing stairs? Y  Dressing or bathing? N  Doing errands, shopping? N  Some recent data might be hidden    Fall Risk Assessment Fall Risk  03/15/2016 01/14/2016 12/10/2015 10/16/2015  Falls in the past year? No Yes No No  Number falls in past yr: - 1 - -  Injury with Fall? - No - -     Depression Screen PHQ 2/9 Scores 03/15/2016 03/03/2016 12/10/2015 10/16/2015  PHQ - 2 Score 0 0 1 1  Exception Documentation - - Medical reason -    Cognitive Testing - 6-CIT  Correct? Score   What year is it? yes 0 0 or 4  What month is it? yes 0 0 or 3  Memorize:    Pia Mau,  43,  [place],      What time is it? (within 1 hour) yes 0 0 or 3  Count  backwards from 20 yes 0 0, 2, or 4  Name the months of the year yes 0 0, 2, or 4  Repeat name & address above yes 0 0, 2, 4, 6, 8, or 10       TOTAL  SCORE  0/28   Interpretation:  Normal  Normal (0-7) Abnormal (8-28)   Current Exercise Habits: Home exercise routine, Type of exercise: walking, Time (Minutes): 60, Frequency (Times/Week): 7, Weekly Exercise (Minutes/Week): 420, Intensity: Moderate Exercise limited by: None identified  Audit-C Alcohol Use Screening  Question Answer Points  How often do you have alcoholic drink? never 0  On days you do drink alcohol, how many drinks do you typically consume? n/a 0  How oftey will you drink 6 or more in a total? never 0  Total Score:  0   A score of 3 or more in women, and 4 or more in men indicates increased risk for alcohol abuse, EXCEPT if all of the points are from question 1.     Assessment & Plan:     Annual Wellness Visit  Reviewed patient's Family Medical History Reviewed and updated list of patient's medical providers Assessment of cognitive impairment was done Assessed patient's functional ability Established a written schedule for health screening [place] Completed and Reviewed  Exercise Activities and Dietary recommendations Goals    None      Immunization History  Administered Date(s) Administered  . Influenza-Unspecified 12/01/2014  . Pneumococcal Conjugate-13 05/08/2013  . Pneumococcal Polysaccharide-23 10/02/2010    Health Maintenance  Topic Date Due  . Hepatitis C Screening  04-13-1945  . TETANUS/TDAP  09/08/1965  . COLONOSCOPY  09/08/1996  . ZOSTAVAX  09/09/2006  . PNA vac Low Risk Adult (2 of 2 - PPSV23) 10/02/2015  . INFLUENZA VACCINE  Addressed     Discussed health benefits of physical activity, and encouraged him to engage in regular exercise appropriate for his age and condition.      ------------------------------------------------------------------------------------------------------------ 1. Annual physical exam Doing well  2. AAA (abdominal aortic aneurysm) without rupture ([place]) Continue annual follow up with vascular surgery  3. Bilateral carotid artery disease ([place]) Continue annual follow up with vascular surgery  4. Chronic low back pain (Location of Primary Source of Pain) (Bilateral) (R>L) No being followed by Dr. Wynona Canes at pain clinic.   5. Hypercholesteremia He is tolerating atorvastatin well with no adverse effects.   - Lipid panel - Comprehensive metabolic panel  6. Essential hypertension Well controlled.  Continue current medications.    7. Hypomagnesemia  - Magnesium  8. History of prostate cancer No longer followed by urology. 4 years s/p radical prostatectomy.  - PSA  9. Smoking greater than 30 pack years UTD on LDCT screening.   10. Need for hepatitis C screening test  - Hepatitis C antibody    [name], MD  [facility]

## 2016-03-16 LAB — MAGNESIUM: Magnesium: 1.9 mg/dL (ref 1.6–2.3)

## 2016-03-16 LAB — COMPREHENSIVE METABOLIC PANEL
A/G RATIO: 1.7 (ref 1.2–2.2)
ALK PHOS: 64 IU/L (ref 39–117)
ALT: 18 IU/L (ref 0–44)
AST: 16 IU/L (ref 0–40)
Albumin: 4.5 g/dL (ref 3.6–4.8)
BILIRUBIN TOTAL: 0.5 mg/dL (ref 0.0–1.2)
BUN/Creatinine Ratio: 16 (ref 10–24)
BUN: 19 mg/dL (ref 8–27)
CHLORIDE: 96 mmol/L (ref 96–106)
CO2: 29 mmol/L (ref 18–29)
Calcium: 9.4 mg/dL (ref 8.6–10.2)
Creatinine, Ser: 1.21 mg/dL (ref 0.76–1.27)
GFR calc Af Amer: 70 mL/min/{1.73_m2} (ref >59)
GFR, EST NON AFRICAN AMERICAN: 61 mL/min/{1.73_m2} (ref >59)
GLOBULIN, TOTAL: 2.7 g/dL (ref 1.5–4.5)
Glucose: 99 mg/dL (ref 65–99)
POTASSIUM: 4.8 mmol/L (ref 3.5–5.2)
SODIUM: 141 mmol/L (ref 134–144)
Total Protein: 7.2 g/dL (ref 6.0–8.5)

## 2016-03-16 LAB — PSA: Prostate Specific Ag, Serum: <0.1 ng/mL (ref 0.0–4.0)

## 2016-03-16 LAB — LIPID PANEL
CHOL/HDL RATIO: 2.9 ratio (ref 0.0–5.0)
CHOLESTEROL TOTAL: 140 mg/dL (ref 100–199)
HDL: 49 mg/dL (ref >39)
LDL CALC: 65 mg/dL (ref 0–99)
TRIGLYCERIDES: 129 mg/dL (ref 0–149)
VLDL Cholesterol Cal: 26 mg/dL (ref 5–40)

## 2016-03-16 LAB — HEPATITIS C ANTIBODY

## 2016-03-30 ENCOUNTER — Encounter: Payer: Self-pay | Admitting: Pain Medicine

## 2016-03-30 ENCOUNTER — Ambulatory Visit
Admission: RE | Admit: 2016-03-30 | Discharge: 2016-03-30 | Disposition: A | Discharge status: Home / Self Care | Payer: PPO | Source: Ambulatory Visit | Attending: Pain Medicine | Admitting: Pain Medicine

## 2016-03-30 ENCOUNTER — Ambulatory Visit: Payer: PPO | Attending: Pain Medicine | Admitting: Pain Medicine

## 2016-03-30 VITALS — BP 100/83 | HR 60 | Temp 97.6°F | Resp 16 | Ht 74.0 in | Wt 175.0 lb

## 2016-03-30 DIAGNOSIS — M1288 Other specific arthropathies, not elsewhere classified, other specified site: Secondary | ICD-10-CM | POA: Diagnosis not present

## 2016-03-30 DIAGNOSIS — M5136 Other intervertebral disc degeneration, lumbar region: Secondary | ICD-10-CM | POA: Insufficient documentation

## 2016-03-30 DIAGNOSIS — F17200 Nicotine dependence, unspecified, uncomplicated: Secondary | ICD-10-CM | POA: Insufficient documentation

## 2016-03-30 DIAGNOSIS — F1190 Opioid use, unspecified, uncomplicated: Secondary | ICD-10-CM | POA: Diagnosis not present

## 2016-03-30 DIAGNOSIS — G894 Chronic pain syndrome: Secondary | ICD-10-CM | POA: Diagnosis not present

## 2016-03-30 DIAGNOSIS — M5441 Lumbago with sciatica, right side: Secondary | ICD-10-CM

## 2016-03-30 DIAGNOSIS — M5186 Other intervertebral disc disorders, lumbar region: Secondary | ICD-10-CM | POA: Insufficient documentation

## 2016-03-30 DIAGNOSIS — G4700 Insomnia, unspecified: Secondary | ICD-10-CM | POA: Diagnosis not present

## 2016-03-30 DIAGNOSIS — M792 Neuralgia and neuritis, unspecified: Secondary | ICD-10-CM | POA: Diagnosis not present

## 2016-03-30 DIAGNOSIS — Z8546 Personal history of malignant neoplasm of prostate: Secondary | ICD-10-CM | POA: Diagnosis not present

## 2016-03-30 DIAGNOSIS — M1611 Unilateral primary osteoarthritis, right hip: Secondary | ICD-10-CM | POA: Insufficient documentation

## 2016-03-30 DIAGNOSIS — I2510 Atherosclerotic heart disease of native coronary artery without angina pectoris: Secondary | ICD-10-CM | POA: Diagnosis not present

## 2016-03-30 DIAGNOSIS — Z79891 Long term (current) use of opiate analgesic: Secondary | ICD-10-CM | POA: Insufficient documentation

## 2016-03-30 DIAGNOSIS — M533 Sacrococcygeal disorders, not elsewhere classified: Secondary | ICD-10-CM | POA: Diagnosis not present

## 2016-03-30 DIAGNOSIS — M545 Low back pain: Secondary | ICD-10-CM | POA: Diagnosis not present

## 2016-03-30 DIAGNOSIS — I10 Essential (primary) hypertension: Secondary | ICD-10-CM | POA: Insufficient documentation

## 2016-03-30 DIAGNOSIS — Z820 Family history of epilepsy and other diseases of the nervous system: Secondary | ICD-10-CM | POA: Diagnosis not present

## 2016-03-30 DIAGNOSIS — Z981 Arthrodesis status: Secondary | ICD-10-CM | POA: Diagnosis not present

## 2016-03-30 DIAGNOSIS — Z7982 Long term (current) use of aspirin: Secondary | ICD-10-CM | POA: Insufficient documentation

## 2016-03-30 DIAGNOSIS — Z86718 Personal history of other venous thrombosis and embolism: Secondary | ICD-10-CM | POA: Insufficient documentation

## 2016-03-30 DIAGNOSIS — M25552 Pain in left hip: Secondary | ICD-10-CM | POA: Diagnosis not present

## 2016-03-30 DIAGNOSIS — E785 Hyperlipidemia, unspecified: Secondary | ICD-10-CM | POA: Insufficient documentation

## 2016-03-30 DIAGNOSIS — Z9049 Acquired absence of other specified parts of digestive tract: Secondary | ICD-10-CM | POA: Diagnosis not present

## 2016-03-30 DIAGNOSIS — M47816 Spondylosis without myelopathy or radiculopathy, lumbar region: Secondary | ICD-10-CM

## 2016-03-30 DIAGNOSIS — I714 Abdominal aortic aneurysm, without rupture: Secondary | ICD-10-CM | POA: Diagnosis not present

## 2016-03-30 DIAGNOSIS — Z8249 Family history of ischemic heart disease and other diseases of the circulatory system: Secondary | ICD-10-CM | POA: Insufficient documentation

## 2016-03-30 DIAGNOSIS — M25551 Pain in right hip: Secondary | ICD-10-CM | POA: Insufficient documentation

## 2016-03-30 DIAGNOSIS — M25559 Pain in unspecified hip: Secondary | ICD-10-CM

## 2016-03-30 DIAGNOSIS — G8929 Other chronic pain: Secondary | ICD-10-CM

## 2016-03-30 DIAGNOSIS — M5442 Lumbago with sciatica, left side: Secondary | ICD-10-CM | POA: Diagnosis not present

## 2016-03-30 DIAGNOSIS — M549 Dorsalgia, unspecified: Secondary | ICD-10-CM | POA: Diagnosis not present

## 2016-03-30 DIAGNOSIS — M961 Postlaminectomy syndrome, not elsewhere classified: Secondary | ICD-10-CM

## 2016-03-30 MED ORDER — OXYCODONE HCL 15 MG PO TABS: 15.0000 mg | ORAL_TABLET | ORAL | 0 refills | Status: DC | PRN

## 2016-03-30 MED ORDER — GABAPENTIN 100 MG PO CAPS: 100.0000 mg | ORAL_CAPSULE | Freq: Every day | ORAL | 1 refills | Status: DC

## 2016-03-30 NOTE — Patient Instructions (Addendum)
You were given a prescription for oxycodone today with instructions how to take it.  You were informed to go get xrays done in medical mall.  There are PRN procedures ordered if you need one, just call.     Pain Score  Introduction: The pain score used by this practice is the Verbal Numerical Rating Scale (VNRS-11). This is an 11-point scale. It is for adults and children 10 years or older. There are significant differences in how the pain score is reported, used, and applied. Forget everything you learned in the past and learn this scoring system.  General Information: The scale should reflect your current level of pain. Unless you are specifically asked for the level of your worst pain, or your average pain. If you are asked for one of these two, then it should be understood that it is over the past 24 hours.  Basic Activities of Daily Living (ADL): Personal hygiene, dressing, eating, transferring, and using restroom.  Instructions: Most patients tend to report their level of pain as a combination of two factors, their physical pain and their psychosocial pain. This last one is also known as "suffering" and it is reflection of how physical pain affects you socially and psychologically. From now on, report them separately. From this point on, when asked to report your pain level, report only your physical pain. Use the following table for reference.  Pain Clinic Pain Levels (0-5/10)  Pain Level Score Description  No Pain 0   Mild pain 1 Nagging, annoying, but does not interfere with basic activities of daily living (ADL). Patients are able to eat, bathe, get dressed, toileting (being able to get on and off the toilet and perform personal hygiene functions), transfer (move in and out of bed or a chair without assistance), and maintain continence (able to control bladder and bowel functions). Blood pressure and heart rate are unaffected. A normal heart rate for a healthy adult ranges from 60 to 100  bpm (beats per minute).   Mild to moderate pain 2 Noticeable and distracting. Impossible to hide from other people. More frequent flare-ups. Still possible to adapt and function close to normal. It can be very annoying and may have occasional stronger flare-ups. With discipline, patients may get used to it and adapt.   Moderate pain 3 Interferes significantly with activities of daily living (ADL). It becomes difficult to feed, bathe, get dressed, get on and off the toilet or to perform personal hygiene functions. Difficult to get in and out of bed or a chair without assistance. Very distracting. With effort, it can be ignored when deeply involved in activities.   Moderately severe pain 4 Impossible to ignore for more than a few minutes. With effort, patients may still be able to manage work or participate in some social activities. Very difficult to concentrate. Signs of autonomic nervous system discharge are evident: dilated pupils (mydriasis); mild sweating (diaphoresis); sleep interference. Heart rate becomes elevated (>115 bpm). Diastolic blood pressure (lower number) rises above 100 mmHg. Patients find relief in laying down and not moving.   Severe pain 5 Intense and extremely unpleasant. Associated with frowning face and frequent crying. Pain overwhelms the senses.  Ability to do any activity or maintain social relationships becomes significantly limited. Conversation becomes difficult. Pacing back and forth is common, as getting into a comfortable position is nearly impossible. Pain wakes you up from deep sleep. Physical signs will be obvious: pupillary dilation; increased sweating; goosebumps; brisk reflexes; cold, clammy hands and feet;  nausea, vomiting or dry heaves; loss of appetite; significant sleep disturbance with inability to fall asleep or to remain asleep. When persistent, significant weight loss is observed due to the complete loss of appetite and sleep deprivation.  Blood pressure and  heart rate becomes significantly elevated. Caution: If elevated blood pressure triggers a pounding headache associated with blurred vision, then the patient should immediately seek attention at an urgent or emergency care unit, as these may be signs of an impending stroke.    Emergency Department Pain Levels (6-10/10)  Emergency Room Pain 6 Severely limiting. Requires emergency care and should not be seen or managed at an outpatient pain management facility. Communication becomes difficult and requires great effort. Assistance to reach the emergency department may be required. Facial flushing and profuse sweating along with potentially dangerous increases in heart rate and blood pressure will be evident.   Distressing pain 7 Self-care is very difficult. Assistance is required to transport, or use restroom. Assistance to reach the emergency department will be required. Tasks requiring coordination, such as bathing and getting dressed become very difficult.   Disabling pain 8 Self-care is no longer possible. At this level, pain is disabling. The individual is unable to do even the most "basic" activities such as walking, eating, bathing, dressing, transferring to a bed, or toileting. Fine motor skills are lost. It is difficult to think clearly.   Incapacitating pain 9 Pain becomes incapacitating. Thought processing is no longer possible. Difficult to remember your own name. Control of movement and coordination are lost.   The worst pain imaginable 10 At this level, most patients pass out from pain. When this level is reached, collapse of the autonomic nervous system occurs, leading to a sudden drop in blood pressure and heart rate. This in turn results in a temporary and dramatic drop in blood flow to the brain, leading to a loss of consciousness. Fainting is one of the body's self defense mechanisms. Passing out puts the brain in a calmed state and causes it to shut down for a while, in order to begin  the healing process.    Summary: 1. Refer to this scale when providing [place] with your pain level. 2. Be accurate and careful when reporting your pain level. This will help with your care. 3. Over-reporting your pain level will lead to loss of credibility. 4. Even a level of 1/10 means that there is pain and will be treated at our facility. 5. High, inaccurate reporting will be documented as "Symptom Exaggeration", leading to loss of credibility and suspicions of possible secondary gains such as obtaining more narcotics, or wanting to appear disabled, for fraudulent reasons. 6. Only pain levels of 5 or below will be seen at our facility. 7. Pain levels of 6 and above will be sent to the Emergency Department and the appointment cancelled. _____________________________________________________________________________________________  GENERAL RISKS AND COMPLICATIONS  What are the risk, side effects and possible complications? Generally speaking, most procedures are safe.  However, with any procedure there are risks, side effects, and the possibility of complications.  The risks and complications are dependent upon the sites that are lesioned, or the type of nerve block to be performed.  The closer the procedure is to the spine, the more serious the risks are.  Great care is taken when placing the radio frequency needles, block needles or lesioning probes, but sometimes complications can occur. 1. Infection: Any time there is an injection through the skin, there is a risk of infection.  This is why sterile conditions are used for these blocks.  There are four possible types of infection. 1. Localized skin infection. 2. Central Nervous System Infection-This can be in the form of Meningitis, which can be deadly. 3. Epidural Infections-This can be in the form of an epidural abscess, which can cause pressure inside of the spine, causing compression of the spinal cord with subsequent paralysis. This would  require an emergency surgery to decompress, and there are no guarantees that the patient would recover from the paralysis. 4. Discitis-This is an infection of the intervertebral discs.  It occurs in about 1% of discography procedures.  It is difficult to treat and it may lead to surgery.        2. Pain: the needles have to go through skin and soft tissues, will cause soreness.       3. Damage to internal structures:  The nerves to be lesioned may be near blood vessels or    other nerves which can be potentially damaged.       4. Bleeding: Bleeding is more common if the patient is taking blood thinners such as  aspirin, Coumadin, Ticiid, Plavix, etc., or if he/she have some genetic predisposition  such as hemophilia. Bleeding into the spinal canal can cause compression of the spinal  cord with subsequent paralysis.  This would require an emergency surgery to  decompress and there are no guarantees that the patient would recover from the  paralysis.       5. Pneumothorax:  Puncturing of a lung is a possibility, every time a needle is introduced in  the area of the chest or upper back.  Pneumothorax refers to free air around the  collapsed lung(s), inside of the thoracic cavity (chest cavity).  Another two possible  complications related to a similar event would include: Hemothorax and Chylothorax.   These are variations of the Pneumothorax, where instead of air around the collapsed  lung(s), you may have blood or chyle, respectively.       6. Spinal headaches: They may occur with any procedures in the area of the spine.       7. Persistent CSF (Cerebro-Spinal Fluid) leakage: This is a rare problem, but may occur  with prolonged intrathecal or epidural catheters either due to the formation of a fistulous  track or a dural tear.       8. Nerve damage: By working so close to the spinal cord, there is always a possibility of  nerve damage, which could be as serious as a permanent spinal cord injury with   paralysis.       9. Death:  Although rare, severe deadly allergic reactions known as "Anaphylactic  reaction" can occur to any of the medications used.      10. Worsening of the symptoms:  We can always make thing worse.  What are the chances of something like this happening? Chances of any of this occuring are extremely low.  By statistics, you have more of a chance of getting killed in a motor vehicle accident: while driving to the hospital than any of the above occurring .  Nevertheless, you should be aware that they are possibilities.  In general, it is similar to taking a shower.  Everybody knows that you can slip, hit your head and get killed.  Does that mean that you should not shower again?  Nevertheless always keep in mind that statistics do not mean anything if you happen to be on the  wrong side of them.  Even if a procedure has a 1 (one) in a 1,000,000 (million) chance of going wrong, it you happen to be that one..Also, keep in mind that by statistics, you have more of a chance of having something go wrong when taking medications.  Who should not have this procedure? If you are on a blood thinning medication (e.g. Coumadin, Plavix, see list of "Blood Thinners"), or if you have an active infection going on, you should not have the procedure.  If you are taking any blood thinners, please inform your physician.  How should I prepare for this procedure?  Do not eat or drink anything at least six hours prior to the procedure.  Bring a driver with you .  It cannot be a taxi.  Come accompanied by an adult that can drive you back, and that is strong enough to help you if your legs get weak or numb from the local anesthetic.  Take all of your medicines the morning of the procedure with just enough water to swallow them.  If you have diabetes, make sure that you are scheduled to have your procedure done first thing in the morning, whenever possible.  If you have diabetes, take only half of  your insulin dose and notify our nurse that you have done so as soon as you arrive at the clinic.  If you are diabetic, but only take blood sugar pills (oral hypoglycemic), then do not take them on the morning of your procedure.  You may take them after you have had the procedure.  Do not take aspirin or any aspirin-containing medications, at least eleven (11) days prior to the procedure.  They may prolong bleeding.  Wear loose fitting clothing that may be easy to take off and that you would not mind if it got stained with Betadine or blood.  Do not wear any jewelry or perfume  Remove any nail coloring.  It will interfere with some of our monitoring equipment.  NOTE: Remember that this is not meant to be interpreted as a complete list of all possible complications.  Unforeseen problems may occur.  BLOOD THINNERS The following drugs contain aspirin or other products, which can cause increased bleeding during surgery and should not be taken for 2 weeks prior to and 1 week after surgery.  If you should need take something for relief of minor pain, you may take acetaminophen which is found in Tylenol,m Datril, Anacin-3 and Panadol. It is not blood thinner. The products listed below are.  Do not take any of the products listed below in addition to any listed on your instruction sheet.  A.P.C or A.P.C with Codeine Codeine Phosphate Capsules #3 Ibuprofen Ridaura  ABC compound Congesprin Imuran rimadil  Advil Cope Indocin Robaxisal  Alka-Seltzer Effervescent Pain Reliever and Antacid Coricidin or Coricidin-D  Indomethacin Rufen  Alka-Seltzer plus Cold Medicine Cosprin Ketoprofen S-A-C Tablets  Anacin Analgesic Tablets or Capsules Coumadin Korlgesic Salflex  Anacin Extra Strength Analgesic tablets or capsules CP-2 Tablets Lanoril Salicylate  Anaprox Cuprimine Capsules Levenox Salocol  Anexsia-D Dalteparin Magan Salsalate  Anodynos Darvon compound Magnesium Salicylate Sine-off  Ansaid Dasin  Capsules Magsal Sodium Salicylate  Anturane Depen Capsules Marnal Soma  APF Arthritis pain formula Dewitt's Pills Measurin Stanback  Argesic Dia-Gesic Meclofenamic Sulfinpyrazone  Arthritis Bayer Timed Release Aspirin Diclofenac Meclomen Sulindac  Arthritis pain formula Anacin Dicumarol Medipren Supac  Analgesic (Safety coated) Arthralgen Diffunasal Mefanamic Suprofen  Arthritis Strength Bufferin Dihydrocodeine Mepro Compound Suprol  Arthropan  liquid Dopirydamole Methcarbomol with Aspirin Synalgos  ASA tablets/Enseals Disalcid Micrainin Tagament  Ascriptin Doan's Midol Talwin  Ascriptin A/D Dolene Mobidin Tanderil  Ascriptin Extra Strength Dolobid Moblgesic Ticlid  Ascriptin with Codeine Doloprin or Doloprin with Codeine Momentum Tolectin  Asperbuf Duoprin Mono-gesic Trendar  Aspergum Duradyne Motrin or Motrin IB Triminicin  Aspirin plain, buffered or enteric coated Durasal Myochrisine Trigesic  Aspirin Suppositories Easprin Nalfon Trillsate  Aspirin with Codeine Ecotrin Regular or Extra Strength Naprosyn Uracel  Atromid-S Efficin Naproxen Ursinus  Auranofin Capsules Elmiron Neocylate Vanquish  Axotal Emagrin Norgesic Verin  Azathioprine Empirin or Empirin with Codeine Normiflo Vitamin E  Azolid Emprazil Nuprin Voltaren  Bayer Aspirin plain, buffered or children's or timed BC Tablets or powders Encaprin Orgaran Warfarin Sodium  Buff-a-Comp Enoxaparin Orudis Zorpin  Buff-a-Comp with Codeine Equegesic Os-Cal-Gesic   Buffaprin Excedrin plain, buffered or Extra Strength Oxalid   Bufferin Arthritis Strength Feldene Oxphenbutazone   Bufferin plain or Extra Strength Feldene Capsules Oxycodone with Aspirin   Bufferin with Codeine Fenoprofen Fenoprofen Pabalate or Pabalate-SF   Buffets II Flogesic Panagesic   Buffinol plain or Extra Strength Florinal or Florinal with Codeine Panwarfarin   Buf-Tabs Flurbiprofen Penicillamine   Butalbital Compound Four-way cold tablets Penicillin    Butazolidin Fragmin Pepto-Bismol   Carbenicillin Geminisyn Percodan   Carna Arthritis Reliever Geopen Persantine   Carprofen Gold's salt Persistin   Chloramphenicol Goody's Phenylbutazone   Chloromycetin Haltrain Piroxlcam   Clmetidine heparin Plaquenil   Cllnoril Hyco-pap Ponstel   Clofibrate Hydroxy chloroquine Propoxyphen         Before stopping any of these medications, be sure to consult the physician who ordered them.  Some, such as Coumadin (Warfarin) are ordered to prevent or treat serious conditions such as "deep thrombosis", "pumonary embolisms", and other heart problems.  The amount of time that you may need off of the medication may also vary with the medication and the reason for which you were taking it.  If you are taking any of these medications, please make sure you notify your pain physician before you undergo any procedures.         Sacroiliac (SI) Joint Injection Patient Information  Description: The sacroiliac joint connects the scrum (very low back and tailbone) to the ilium (a pelvic bone which also forms half of the hip joint).  Normally this joint experiences very little motion.  When this joint becomes inflamed or unstable low back and or hip and pelvis pain may result.  Injection of this joint with local anesthetics (numbing medicines) and steroids can provide diagnostic information and reduce pain.  This injection is performed with the aid of x-ray guidance into the tailbone area while you are lying on your stomach.   You may experience an electrical sensation down the leg while this is being done.  You may also experience numbness.  We also may ask if we are reproducing your normal pain during the injection.  Conditions which may be treated SI injection:   Low back, buttock, hip or leg pain  Preparation for the Injection:  1. Do not eat any solid food or dairy products within 8 hours of your appointment.  2. You may drink clear liquids up to 3 hours  before appointment.  Clear liquids include water, black coffee, juice or soda.  No milk or cream please. 3. You may take your regular medications, including pain medications with a sip of water before your appointment.  Diabetics should hold regular insulin (if take separately) and  take 1/2 normal NPH dose the morning of the procedure.  Carry some sugar containing items with you to your appointment. 4. A driver must accompany you and be prepared to drive you home after your procedure. 5. Bring all of your current medications with you. 6. An IV may be inserted and sedation may be given at the discretion of the physician. 7. A blood pressure cuff, EKG and other monitors will often be applied during the procedure.  Some patients may need to have extra oxygen administered for a short period.  8. You will be asked to provide medical information, including your allergies, prior to the procedure.  We must know immediately if you are taking blood thinners (like Coumadin/Warfarin) or if you are allergic to IV iodine contrast (dye).  We must know if you could possible be pregnant.  Possible side effects:   Bleeding from needle site  Infection (rare, may require surgery)  Nerve injury (rare)  Numbness & tingling (temporary)  A brief convulsion or seizure  Light-headedness (temporary)  Pain at injection site (several days)  Decreased blood pressure (temporary)  Weakness in the leg (temporary)   Call if you experience:   New onset weakness or numbness of an extremity below the injection site that last more than 8 hours.  Hives or difficulty breathing ( go to the emergency room)  Inflammation or drainage at the injection site  Any new symptoms which are concerning to you  Please note:  Although the local anesthetic injected can often make your back/ hip/ buttock/ leg feel good for several hours after the injections, the pain will likely return.  It takes 3-7 days for steroids to work in  the sacroiliac area.  You may not notice any pain relief for at least that one week.  If effective, we will often do a series of three injections spaced 3-6 weeks apart to maximally decrease your pain.  After the initial series, we generally will wait some months before a repeat injection of the same type.  If you have any questions, please call [phone] [facility]  Facet Blocks Patient Information  Description: The facets are joints in the spine between the vertebrae.  Like any joints in the body, facets can become irritated and painful.  Arthritis can also effect the facets.  By injecting steroids and local anesthetic in and around these joints, we can temporarily block the nerve supply to them.  Steroids act directly on irritated nerves and tissues to reduce selling and inflammation which often leads to decreased pain.  Facet blocks may be done anywhere along the spine from the neck to the low back depending upon the location of your pain.   After numbing the skin with local anesthetic (like Novocaine), a small needle is passed onto the facet joints under x-ray guidance.  You may experience a sensation of pressure while this is being done.  The entire block usually lasts about 15-25 minutes.   Conditions which may be treated by facet blocks:   Low back/buttock pain  Neck/shoulder pain  Certain types of headaches  Preparation for the injection:  10. Do not eat any solid food or dairy products within 8 hours of your appointment. 11. You may drink clear liquid up to 3 hours before appointment.  Clear liquids include water, black coffee, juice or soda.  No milk or cream please. 12. You may take your regular medication, including pain medications, with a sip of water before your  appointment.  Diabetics should hold regular insulin (if taken separately) and take 1/2 normal NPH dose the morning of the procedure.  Carry some sugar containing items with you  to your appointment. 13. A driver must accompany you and be prepared to drive you home after your procedure. 84. Bring all your current medications with you. 15. An IV may be inserted and sedation may be given at the discretion of the physician. 16. A blood pressure cuff, EKG and other monitors will often be applied during the procedure.  Some patients may need to have extra oxygen administered for a short period. 40. You will be asked to provide medical information, including your allergies and medications, prior to the procedure.  We must know immediately if you are taking blood thinners (like Coumadin/Warfarin) or if you are allergic to IV iodine contrast (dye).  We must know if you could possible be pregnant.  Possible side-effects:   Bleeding from needle site  Infection (rare, may require surgery)  Nerve injury (rare)  Numbness & tingling (temporary)  Difficulty urinating (rare, temporary)  Spinal headache (a headache worse with upright posture)  Light-headedness (temporary)  Pain at injection site (serveral days)  Decreased blood pressure (rare, temporary)  Weakness in arm/leg (temporary)  Pressure sensation in back/neck (temporary)   Call if you experience:   Fever/chills associated with headache or increased back/neck pain  Headache worsened by an upright position  New onset, weakness or numbness of an extremity below the injection site  Hives or difficulty breathing (go to the emergency room)  Inflammation or drainage at the injection site(s)  Severe back/neck pain greater than usual  New symptoms which are concerning to you  Please note:  Although the local anesthetic injected can often make your back or neck feel good for several hours after the injection, the pain will likely return. It takes 3-7 days for steroids to work.  You may not notice any pain relief for at least one week.  If effective, we will often do a series of 2-3 injections spaced 3-6  weeks apart to maximally decrease your pain.  After the initial series, you may be a candidate for a more permanent nerve block of the facets.  If you have any questions, please call #[phone]) [facility]  Initial Gabapentin Titration  Medication used: Gabapentin (Generic Name) or Neurontin (Brand Name) 100 mg tablets/capsules  Reasons to stop increasing the dose:  Reason 1: You get good relief of symptoms, in which case there is no need to increase the daily dose any further.    Reason 2: You develop some side effects, such as sleeping all of the time, difficulty concentrating, or becoming disoriented, in which case you need to go down on the dose, to the prior level, where you were not experiencing any side effects. Stay on that dose longer, to allow more time for your body to get use it, before attempting to increase it again.   Steps: Step 1: Start by taking 1 (one) tablet at bedtime x 7 (seven) days.  Step 2: After being on 1 (one) tablet for 7 (seven) days, then increase it to 2 (two) tablets at bedtime for another 7 (seven) days.  Step 3: Next, after being on 2 (two) tablets at bedtime for 7 (seven) days, then increase it to 3 (three) tablets at bedtime, and stay on that dose until you see your doctor.  Reasons to stop increasing the dose: Reason 1: You get  good relief of symptoms, in which case there is no need to increase the daily dose any further.  Reason 2: You develop some side effects, such as sleeping all of the time, difficulty concentrating, or becoming disoriented, in which case you need to go down on the dose, to the prior level, where you were not experiencing any side effects. Stay on that dose longer, to allow more time for your body to get use it, before attempting to increase it again.  Endpoint: Once you have reached the maximum dose you can tolerate without side-effects, contact your physician so as to evaluate the results of  the regimen.   Questions: Feel free to contact us for any questions or problems at [phone]

## 2016-03-30 NOTE — Progress Notes (Signed)
Nursing Pain Medication Assessment:  Safety precautions to be maintained throughout the outpatient stay will include: orient to surroundings, keep bed in low position, maintain call bell within reach at all times, provide assistance with transfer out of bed and ambulation.  Medication Inspection Compliance: Pill count conducted under aseptic conditions, in front of the patient. Neither the pills nor the bottle was removed from the patient's sight at any time. Once count was completed pills were immediately returned to the patient in their original bottle.  Medication: Oxycodone IR Pill/Patch Count: 57 of 140 pills remain Bottle Appearance: Standard pharmacy container. Clearly labeled. Filled Date: 02 / 07 / 2018 Last Medication intake:  Today

## 2016-03-30 NOTE — Progress Notes (Signed)
Patient's Name: Justin Harrington  MRN: [id number]  Referring Provider: [name], MD  DOB: [birth year]  PCP: [name], MD  DOS: 03/30/2016  Note by: Kathlen Brunswick. Dossie Arbour, MD  Service setting: Ambulatory outpatient  Specialty: Interventional Pain Management  Location: [facility] (AMB) Pain Management Facility    Patient type: Established   Primary Reason(s) for Visit: Encounter for prescription drug management (Level of risk: moderate) CC: Back Pain (lower)  HPI  Justin Harrington is a 71 y.o. year old, male patient, who comes today for a medication management evaluation. He has Carotid arterial disease ([place]); Cataract of left eye; Diverticulosis of colon without hemorrhage; Elevated PSA; History of DVT (deep vein thrombosis); Insomnia; History of prostate cancer; Arthrodesis status; Smoking greater than 30 pack years; Hypercholesteremia; Hypertension; Opioid-induced constipation (OIC); Long term current use of opiate analgesic; Long term prescription opiate use; Opiate use (180 MME/Day); Encounter for therapeutic drug level monitoring; Chronic low back pain (Location of Primary Source of Pain) (Bilateral) (R>L); Failed back surgical syndrome; History of lumbar fusion (L4-S1 posterior hardware); Lumbar spondylosis; Lumbar facet syndrome (Bilateral) (R>L); Hypokalemia; Hypomagnesemia; Chronic sacroiliac joint pain (Bilateral) (R>L); Chronic hip pain (Bilateral) (R>L); Chronic pain syndrome; Bilateral carotid artery stenosis; AAA (abdominal aortic aneurysm) without rupture ([place]); Urinary hesitancy; H/O prostatectomy; Neurogenic pain; and Neuropathic pain on his problem list. His primarily concern today is the Back Pain (lower)  Pain Assessment: Self-Reported Pain Score: 5 /10 Clinically the patient looks like a 2/10 Reported level is inconsistent with clinical observations. Information on the proper use of the pain scale provided to the patient today Pain Type: Chronic pain Pain Location: Back Pain  Orientation: Lower Pain Descriptors / Indicators: Aching, Constant Pain Frequency: Constant  Justin Harrington was last scheduled for an appointment on 03/11/2016 for medication management. During today's appointment we reviewed Justin Harrington's chronic pain status, as well as his outpatient medication regimen.  The patient  reports that he does not use drugs. His body mass index is 22.47 kg/m.  Further details on both, my assessment(s), as well as the proposed treatment plan, please see below.  Controlled Substance Pharmacotherapy Assessment REMS (Risk Evaluation and Mitigation Strategy)  Analgesic:Oxycodone IR 30 mg every 6 hours (120 mg/dayof oxycodone) MME/day:163m/day  PEvon Slack RN  03/30/2016  9:26 AM  Sign at close encounter Nursing Pain Medication Assessment:  Safety precautions to be maintained throughout the outpatient stay will include: orient to surroundings, keep bed in low position, maintain call bell within reach at all times, provide assistance with transfer out of bed and ambulation.  Medication Inspection Compliance: Pill count conducted under aseptic conditions, in front of the patient. Neither the pills nor the bottle was removed from the patient's sight at any time. Once count was completed pills were immediately returned to the patient in their original bottle.  Medication: Oxycodone IR Pill/Patch Count: 57 of 140 pills remain Bottle Appearance: Standard pharmacy container. Clearly labeled. Filled Date: 02 / 07 / 2018 Last Medication intake:  Today   Pharmacokinetics: Liberation and absorption (onset of action): WNL Distribution (time to peak effect): WNL Metabolism and excretion (duration of action): WNL         Pharmacodynamics: Desired effects: Analgesia: Mr. CLennartzreports >50% benefit. Functional ability: Patient reports that medication allows him to accomplish basic ADLs Clinically meaningful improvement in function (CMIF): Sustained CMIF goals  met Perceived effectiveness: Described as relatively effective, allowing for increase in activities of daily living (ADL) Undesirable effects: Side-effects or Adverse reactions: None reported  Monitoring: [place] PMP: Online review of the past 75-monthperiod conducted. Compliant with practice rules and regulations List of all UDS test(s) done:  Lab Results  Component Value Date   SUMMARY FINAL 10/16/2015   Last UDS on record: No results found for: TOXASSSELUR UDS interpretation: Compliant          Medication Assessment Form: Reviewed. Patient indicates being compliant with therapy Treatment compliance: Compliant Risk Assessment Profile: Aberrant behavior: See prior evaluations. None observed or detected today Comorbid factors increasing risk of overdose: See prior notes. No additional risks detected today Risk of substance use disorder (SUD): Low Opioid Risk Tool (ORT) Total Score: 0  Interpretation Table:  Score <3 = Low Risk for SUD  Score between 4-7 = Moderate Risk for SUD  Score >8 = High Risk for Opioid Abuse   Risk Mitigation Strategies:  Patient Counseling: Covered Patient-Prescriber Agreement (PPA): Present and active  Notification to other healthcare providers: Done  Pharmacologic Plan: No change in therapy, at this time  Laboratory Chemistry  Inflammation Markers Lab Results  Component Value Date   ESRSEDRATE 1 10/16/2015   CRP 2.7 (H) 10/16/2015   Renal Function Lab Results  Component Value Date   BUN 19 03/15/2016   CREATININE 1.21 03/15/2016   GFRAA 70 03/15/2016   GFRNONAA 61 03/15/2016   Hepatic Function Lab Results  Component Value Date   AST 16 03/15/2016   ALT 18 03/15/2016   ALBUMIN 4.5 03/15/2016   Electrolytes Lab Results  Component Value Date   NA 141 03/15/2016   K 4.8 03/15/2016   CL 96 03/15/2016   CALCIUM 9.4 03/15/2016   MG 1.9 03/15/2016   Pain Modulating Vitamins Lab Results  Component Value Date   25OHVITD1 45 10/16/2015    25OHVITD2 <1.0 10/16/2015   25OHVITD3 44 10/16/2015   VITAMINB12 206 10/16/2015   Coagulation Parameters Lab Results  Component Value Date   INR 1.0 12/03/2011   LABPROT 13.2 12/03/2011   APTT 28.5 12/03/2011   PLT 173 12/03/2011   Cardiovascular Lab Results  Component Value Date   HGB 14.5 12/03/2011   HCT 42.7 12/03/2011   Note: Lab results reviewed.  Recent Diagnostic Imaging Review  Dg C-arm 1-60 Min-no Report  Result Date: 01/14/2016 There is no Radiologist interpretation  for this exam.  Note: Imaging results reviewed.          Meds  The patient has a current medication list which includes the following prescription(s): aspirin ec, atorvastatin, hydrochlorothiazide, lubiprostone, naloxone, oxycodone hcl, trazodone, benefiber, gabapentin, and oxycodone.  Current Outpatient Prescriptions on File Prior to Visit  Medication Sig  . aspirin EC 81 MG tablet Take 1 tablet by mouth daily.  .Marland Kitchenatorvastatin (LIPITOR) 80 MG tablet Take 1 tablet (80 mg total) by mouth daily.  . hydrochlorothiazide (HYDRODIURIL) 25 MG tablet Take 1 tablet (25 mg total) by mouth daily.  .Marland Kitchenlubiprostone (AMITIZA) 24 MCG capsule Take 1 capsule (24 mcg total) by mouth 2 (two) times daily with a meal. Swallow the medication whole. Do not break or chew the medication.  . naloxone ([facility] 2 MG/2ML injection   . Oxycodone HCl 20 MG TABS Take 1 tablet (20 mg total) by mouth 5 (five) times daily.  . traZODone (DESYREL) 150 MG tablet Take 1 tablet (150 mg total) by mouth at bedtime.  . Wheat Dextrin (BENEFIBER) POWD Stir 2 tsp. TID into 4-8 oz of any non-carbonated beverage or soft food (hot or cold)   No current facility-administered medications  on file prior to visit.    ROS  Constitutional: Denies any fever or chills Gastrointestinal: No reported hemesis, hematochezia, vomiting, or acute GI distress Musculoskeletal: Denies any acute onset joint swelling, redness, loss of ROM, or weakness Neurological:  No reported episodes of acute onset apraxia, aphasia, dysarthria, agnosia, amnesia, paralysis, loss of coordination, or loss of consciousness  Allergies  Mr. Stewart has No Known Allergies.  [place]  Drug: Mr. Spadafore  reports that he does not use drugs. Alcohol:  reports that he does not drink alcohol. Tobacco:  reports that he has been smoking Cigarettes.  He has a 37.50 pack-year smoking history. He has never used smokeless tobacco. Medical:  has a past medical history of Aortic aneurysm ([place]); CAD (coronary artery disease); DDD (degenerative disc disease), lumbar; Hyperlipidemia; Personal history of tobacco use, presenting hazards to health (06/12/2015); Personal history of tobacco use, presenting hazards to health (06/12/2015); and Prostate cancer ([place]). Family: family history includes Cerebral palsy in his son; Congestive Heart Failure in his father; Heart attack in his mother.  Past Surgical History:  Procedure Laterality Date  . BACK SURGERY  1998   Lumbar spine fusion  . carotid doppler ultrasound  10/05/2011   75-90% RCA occlusion, 50% on left  . CAROTID ENDARTERECTOMY Right 12/02/2011   Dr. Lucky Cowboy  . CHOLECYSTECTOMY  10/02/2010   Laparoscopic, Dr. Pat Patrick, [facility]  . 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 Cholelithiasis and acute cholecystiis. Mild enlargement of left adrenal gland  . PROSTATE SURGERY  10/19/2012   prostatectomy. [facility]; Laser assisted, Done by [name] for prostate cancer   Constitutional Exam  General appearance: Well nourished, well developed, and well hydrated. In no apparent acute distress Vitals:   03/30/16 0914  BP: 100/83  Pulse: 60  Resp: 16  Temp: 97.6 F (36.4 C)  TempSrc: Oral  SpO2: 96%  Weight: 175 lb (79.4 kg)  Height: '6\' 2"'  (1.88 m)   BMI Assessment: Estimated body mass index is 22.47 kg/m as calculated from the following:   Height as of this encounter: '6\' 2"'   (1.88 m).   Weight as of this encounter: 175 lb (79.4 kg).  BMI interpretation table: BMI level Category Range association with higher incidence of chronic pain  <18 kg/m2 Underweight   18.5-24.9 kg/m2 Ideal body weight   25-29.9 kg/m2 Overweight Increased incidence by 20%  30-34.9 kg/m2 Obese (Class I) Increased incidence by 68%  35-39.9 kg/m2 Severe obesity (Class II) Increased incidence by 136%  >40 kg/m2 Extreme obesity (Class Har[place]) Increased incidence by 254%   BMI Readings from Last 4 Encounters:  03/30/16 22.47 kg/m  03/15/16 24.01 kg/m  03/03/16 22.47 kg/m  01/14/16 22.47 kg/m   Wt Readings from Last 4 Encounters:  03/30/16 175 lb (79.4 kg)  03/15/16 182 lb (82.6 kg)  03/03/16 175 lb (79.4 kg)  01/14/16 175 lb (79.4 kg)  Psych/Mental status: Alert, oriented x 3 (person, place, & time)       Eyes: PERLA Respiratory: No evidence of acute respiratory distress  Cervical Spine Exam  Inspection: No masses, redness, or swelling Alignment: Symmetrical Functional ROM: Unrestricted ROM Stability: No instability detected Muscle strength & Tone: Functionally intact Sensory: Unimpaired Palpation: Non-contributory  Upper Extremity (UE) Exam    Side: Right upper extremity  Side: Left upper extremity  Inspection: No masses, redness, swelling, or asymmetry. No contractures  Inspection: No masses, redness, swelling, or asymmetry. No contractures  Functional ROM: Unrestricted ROM          Functional ROM: Unrestricted ROM          Muscle strength & Tone: Functionally intact  Muscle strength & Tone: Functionally intact  Sensory: Unimpaired  Sensory: Unimpaired  Palpation: Euthermic  Palpation: Euthermic  Specialized Test(s): Deferred         Specialized Test(s): Deferred          Thoracic Spine Exam  Inspection: No masses, redness, or swelling Alignment: Symmetrical Functional ROM: Unrestricted ROM Stability: No instability detected Sensory: Unimpaired Muscle strength & Tone:  Functionally intact Palpation: Non-contributory  Lumbar Spine Exam  Inspection: No masses, redness, or swelling Alignment: Symmetrical Functional ROM: Unrestricted ROM Stability: No instability detected Muscle strength & Tone: Functionally intact Sensory: Unimpaired Palpation: Non-contributory Provocative Tests: Lumbar Hyperextension and rotation test: evaluation deferred today       Patrick's Maneuver: evaluation deferred today              Gait & Posture Assessment  Ambulation: Unassisted Gait: Relatively normal for age and body habitus Posture: WNL   Lower Extremity Exam    Side: Right lower extremity  Side: Left lower extremity  Inspection: No masses, redness, swelling, or asymmetry. No contractures  Inspection: No masses, redness, swelling, or asymmetry. No contractures  Functional ROM: Unrestricted ROM          Functional ROM: Unrestricted ROM          Muscle strength & Tone: Functionally intact  Muscle strength & Tone: Functionally intact  Sensory: Unimpaired  Sensory: Unimpaired  Palpation: No palpable anomalies  Palpation: No palpable anomalies   Assessment  Primary Diagnosis & Pertinent Problem List: The primary encounter diagnosis was Chronic pain syndrome. Diagnoses of Chronic low back pain (Location of Primary Source of Pain) (Bilateral) (R>L), Failed back surgical syndrome, Chronic sacroiliac joint pain (Bilateral) (R>L), History of prostate cancer, Lumbar facet syndrome (Bilateral) (R>L), Long term current use of opiate analgesic, Opiate use (180 MME/Day), Hip pain, chronic, unspecified laterality, Chronic sacroiliac joint pain, Neurogenic pain, and Neuropathic pain were also pertinent to this visit.  Status Diagnosis  Controlled Controlled Controlled 1. Chronic pain syndrome   2. Chronic low back pain (Location of Primary Source of Pain) (Bilateral) (R>L)   3. Failed back surgical syndrome   4. Chronic sacroiliac joint pain (Bilateral) (R>L)   5. History of  prostate cancer   6. Lumbar facet syndrome (Bilateral) (R>L)   7. Long term current use of opiate analgesic   8. Opiate use (180 MME/Day)   9. Hip pain, chronic, unspecified laterality   10. Chronic sacroiliac joint pain   11. Neurogenic pain   12. Neuropathic pain      Plan of Care  Pharmacotherapy (Medications Ordered): Meds ordered this encounter  Medications  . oxyCODONE (ROXICODONE) 15 MG immediate release tablet    Sig: Take 1 tablet (15 mg total) by mouth every 4 (four) hours as needed for pain. Max of 6/day until 04/25/16, then change to 5/day.    Dispense:  165 tablet    Refill:  0    Do not place this medication, or any other prescription from our practice, on "Automatic Refill". Patient may have prescription filled one day early if pharmacy is closed on scheduled refill date. Do not fill until: 04/10/16 To last until: 05/10/16  . gabapentin (NEURONTIN) 100 MG capsule    Sig: Take 1-3 capsules (100-300 mg total) by mouth at bedtime. Follow written titration schedule.  Dispense:  90 capsule    Refill:  1    Do not place this medication, or any other prescription from our practice, on "Automatic Refill". Patient may have prescription filled one day early if pharmacy is closed on scheduled refill date.   New Prescriptions   GABAPENTIN (NEURONTIN) 100 MG CAPSULE    Take 1-3 capsules (100-300 mg total) by mouth at bedtime. Follow written titration schedule.   OXYCODONE (ROXICODONE) 15 MG IMMEDIATE RELEASE TABLET    Take 1 tablet (15 mg total) by mouth every 4 (four) hours as needed for pain. Max of 6/day until 04/25/16, then change to 5/day.   Medications administered today: Mr. Dunnaway had no medications administered during this visit. Lab-work, procedure(s), and/or referral(s): Orders Placed This Encounter  Procedures  . LUMBAR FACET(MEDIAL BRANCH NERVE BLOCK) MBNB  . SACROILIAC JOINT INJECTINS  . Caudal Epidural Injection  . HIP INJECTION  . ToxASSURE Select 13 (MW),  Urine   Imaging and/or referral(s): DG HIP UNILAT W OR W/O PELVIS 2-3 VIEWS LEFT DG HIP UNILAT W OR W/O PELVIS 2-3 VIEWS RIGHT DG SI JOINTS  Interventional therapies: Planned, scheduled, and/or pending:   None at this time.   Considering:   Diagnostic bilateral Lumbarfacet block + sacroiliac joint block #2 Diagnostic bilateral lumbar facet block Possible bilateral lumbar facet radiofrequency ablation Diagnostic bilateral sacroiliac joint injection  Possible bilateral sacroiliac joint radiofrequency ablation. Diagnostic bilateral intra-articular hip joint injection  Possible bilateral hip joint radiofrequency ablation. Caudal epidural steroid injection + epidurogram.  Possible Racz procedure.    Palliative PRN treatment(s):   Palliative bilateral Lumbarfacet block + sacroiliac joint block #2  Palliative bilateral lumbar facet block Palliative bilateral sacroiliac joint injection  Diagnostic Caudal epidural steroid injection + epidurogram.    Provider-requested follow-up: Return in about 1 month (around 04/27/2016) for (MD) Med-Mgmt, in addition, (PRN) procedure.  Future Appointments Date Time Provider [place]  04/22/2016 8:00 AM [name], MD [facility] None  06/18/2016 8:30 AM [facility] None  06/18/2016 9:30 AM Algernon Huxley, MD [facility] None  12/17/2016 9:30 AM [facility] None  12/17/2016 10:30 AM Algernon Huxley, MD [facility] None   Primary Care Physician: [name], MD Location: [facility] Note by: Kathlen Brunswick. Dossie Arbour, M.D, DABA, DABAPM, DABPM, DABIPP, FIPP Date: 03/30/2016; Time: 11:06 AM  Pain Score Disclaimer: We use the NRS-11 scale. This is a self-reported, subjective measurement of pain severity with only modest accuracy. It is used primarily to identify changes within a particular patient. It must be understood that outpatient pain scales are significantly less accurate that those used for  research, where they can be applied under ideal controlled circumstances with minimal exposure to variables. In reality, the score is likely to be a combination of pain intensity and pain affect, where pain affect describes the degree of emotional arousal or changes in action readiness caused by the sensory experience of pain. Factors such as social and work situation, setting, emotional state, anxiety levels, expectation, and prior pain experience may influence pain perception and show large inter-individual differences that may also be affected by time variables.  Patient instructions provided during this appointment: Patient Instructions   You were given a prescription for oxycodone today with instructions how to take it.  You were informed to go get xrays done in medical mall.  There are PRN procedures ordered if you need one, just call.     Pain Score  Introduction: The pain  score used by this practice is the Verbal Numerical Rating Scale (VNRS-11). This is an 11-point scale. It is for adults and children 10 years or older. There are significant differences in how the pain score is reported, used, and applied. Forget everything you learned in the past and learn this scoring system.  General Information: The scale should reflect your current level of pain. Unless you are specifically asked for the level of your worst pain, or your average pain. If you are asked for one of these two, then it should be understood that it is over the past 24 hours.  Basic Activities of Daily Living (ADL): Personal hygiene, dressing, eating, transferring, and using restroom.  Instructions: Most patients tend to report their level of pain as a combination of two factors, their physical pain and their psychosocial pain. This last one is also known as "suffering" and it is reflection of how physical pain affects you socially and psychologically. From now on, report them separately. From this point on, when asked to  report your pain level, report only your physical pain. Use the following table for reference.  Pain Clinic Pain Levels (0-5/10)  Pain Level Score Description  No Pain 0   Mild pain 1 Nagging, annoying, but does not interfere with basic activities of daily living (ADL). Patients are able to eat, bathe, get dressed, toileting (being able to get on and off the toilet and perform personal hygiene functions), transfer (move in and out of bed or a chair without assistance), and maintain continence (able to control bladder and bowel functions). Blood pressure and heart rate are unaffected. A normal heart rate for a healthy adult ranges from 60 to 100 bpm (beats per minute).   Mild to moderate pain 2 Noticeable and distracting. Impossible to hide from other people. More frequent flare-ups. Still possible to adapt and function close to normal. It can be very annoying and may have occasional stronger flare-ups. With discipline, patients may get used to it and adapt.   Moderate pain 3 Interferes significantly with activities of daily living (ADL). It becomes difficult to feed, bathe, get dressed, get on and off the toilet or to perform personal hygiene functions. Difficult to get in and out of bed or a chair without assistance. Very distracting. With effort, it can be ignored when deeply involved in activities.   Moderately severe pain 4 Impossible to ignore for more than a few minutes. With effort, patients may still be able to manage work or participate in some social activities. Very difficult to concentrate. Signs of autonomic nervous system discharge are evident: dilated pupils (mydriasis); mild sweating (diaphoresis); sleep interference. Heart rate becomes elevated (>115 bpm). Diastolic blood pressure (lower number) rises above 100 mmHg. Patients find relief in laying down and not moving.   Severe pain 5 Intense and extremely unpleasant. Associated with frowning face and frequent crying. Pain overwhelms  the senses.  Ability to do any activity or maintain social relationships becomes significantly limited. Conversation becomes difficult. Pacing back and forth is common, as getting into a comfortable position is nearly impossible. Pain wakes you up from deep sleep. Physical signs will be obvious: pupillary dilation; increased sweating; goosebumps; brisk reflexes; cold, clammy hands and feet; nausea, vomiting or dry heaves; loss of appetite; significant sleep disturbance with inability to fall asleep or to remain asleep. When persistent, significant weight loss is observed due to the complete loss of appetite and sleep deprivation.  Blood pressure and heart rate becomes significantly elevated. Caution:  If elevated blood pressure triggers a pounding headache associated with blurred vision, then the patient should immediately seek attention at an urgent or emergency care unit, as these may be signs of an impending stroke.    Emergency Department Pain Levels (6-10/10)  Emergency Room Pain 6 Severely limiting. Requires emergency care and should not be seen or managed at an outpatient pain management facility. Communication becomes difficult and requires great effort. Assistance to reach the emergency department may be required. Facial flushing and profuse sweating along with potentially dangerous increases in heart rate and blood pressure will be evident.   Distressing pain 7 Self-care is very difficult. Assistance is required to transport, or use restroom. Assistance to reach the emergency department will be required. Tasks requiring coordination, such as bathing and getting dressed become very difficult.   Disabling pain 8 Self-care is no longer possible. At this level, pain is disabling. The individual is unable to do even the most "basic" activities such as walking, eating, bathing, dressing, transferring to a bed, or toileting. Fine motor skills are lost. It is difficult to think clearly.   Incapacitating  pain 9 Pain becomes incapacitating. Thought processing is no longer possible. Difficult to remember your own name. Control of movement and coordination are lost.   The worst pain imaginable 10 At this level, most patients pass out from pain. When this level is reached, collapse of the autonomic nervous system occurs, leading to a sudden drop in blood pressure and heart rate. This in turn results in a temporary and dramatic drop in blood flow to the brain, leading to a loss of consciousness. Fainting is one of the body's self defense mechanisms. Passing out puts the brain in a calmed state and causes it to shut down for a while, in order to begin the healing process.    Summary: 1. Refer to this scale when providing [place] with your pain level. 2. Be accurate and careful when reporting your pain level. This will help with your care. 3. Over-reporting your pain level will lead to loss of credibility. 4. Even a level of 1/10 means that there is pain and will be treated at our facility. 5. High, inaccurate reporting will be documented as "Symptom Exaggeration", leading to loss of credibility and suspicions of possible secondary gains such as obtaining more narcotics, or wanting to appear disabled, for fraudulent reasons. 6. Only pain levels of 5 or below will be seen at our facility. 7. Pain levels of 6 and above will be sent to the Emergency Department and the appointment cancelled. _____________________________________________________________________________________________  GENERAL RISKS AND COMPLICATIONS  What are the risk, side effects and possible complications? Generally speaking, most procedures are safe.  However, with any procedure there are risks, side effects, and the possibility of complications.  The risks and complications are dependent upon the sites that are lesioned, or the type of nerve block to be performed.  The closer the procedure is to the spine, the more serious the risks are.   Great care is taken when placing the radio frequency needles, block needles or lesioning probes, but sometimes complications can occur. 1. Infection: Any time there is an injection through the skin, there is a risk of infection.  This is why sterile conditions are used for these blocks.  There are four possible types of infection. 1. Localized skin infection. 2. Central Nervous System Infection-This can be in the form of Meningitis, which can be deadly. 3. Epidural Infections-This can be in the form of  an epidural abscess, which can cause pressure inside of the spine, causing compression of the spinal cord with subsequent paralysis. This would require an emergency surgery to decompress, and there are no guarantees that the patient would recover from the paralysis. 4. Discitis-This is an infection of the intervertebral discs.  It occurs in about 1% of discography procedures.  It is difficult to treat and it may lead to surgery.        2. Pain: the needles have to go through skin and soft tissues, will cause soreness.       3. Damage to internal structures:  The nerves to be lesioned may be near blood vessels or    other nerves which can be potentially damaged.       4. Bleeding: Bleeding is more common if the patient is taking blood thinners such as  aspirin, Coumadin, Ticiid, Plavix, etc., or if he/she have some genetic predisposition  such as hemophilia. Bleeding into the spinal canal can cause compression of the spinal  cord with subsequent paralysis.  This would require an emergency surgery to  decompress and there are no guarantees that the patient would recover from the  paralysis.       5. Pneumothorax:  Puncturing of a lung is a possibility, every time a needle is introduced in  the area of the chest or upper back.  Pneumothorax refers to free air around the  collapsed lung(s), inside of the thoracic cavity (chest cavity).  Another two possible  complications related to a similar event would  include: Hemothorax and Chylothorax.   These are variations of the Pneumothorax, where instead of air around the collapsed  lung(s), you may have blood or chyle, respectively.       6. Spinal headaches: They may occur with any procedures in the area of the spine.       7. Persistent CSF (Cerebro-Spinal Fluid) leakage: This is a rare problem, but may occur  with prolonged intrathecal or epidural catheters either due to the formation of a fistulous  track or a dural tear.       8. Nerve damage: By working so close to the spinal cord, there is always a possibility of  nerve damage, which could be as serious as a permanent spinal cord injury with  paralysis.       9. Death:  Although rare, severe deadly allergic reactions known as "Anaphylactic  reaction" can occur to any of the medications used.      10. Worsening of the symptoms:  We can always make thing worse.  What are the chances of something like this happening? Chances of any of this occuring are extremely low.  By statistics, you have more of a chance of getting killed in a motor vehicle accident: while driving to the hospital than any of the above occurring .  Nevertheless, you should be aware that they are possibilities.  In general, it is similar to taking a shower.  Everybody knows that you can slip, hit your head and get killed.  Does that mean that you should not shower again?  Nevertheless always keep in mind that statistics do not mean anything if you happen to be on the wrong side of them.  Even if a procedure has a 1 (one) in a 1,000,000 (million) chance of going wrong, it you happen to be that one..Also, keep in mind that by statistics, you have more of a chance of having something go wrong when taking medications.  Who should not have this procedure? If you are on a blood thinning medication (e.g. Coumadin, Plavix, see list of "Blood Thinners"), or if you have an active infection going on, you should not have the procedure.  If you are  taking any blood thinners, please inform your physician.  How should I prepare for this procedure?  Do not eat or drink anything at least six hours prior to the procedure.  Bring a driver with you .  It cannot be a taxi.  Come accompanied by an adult that can drive you back, and that is strong enough to help you if your legs get weak or numb from the local anesthetic.  Take all of your medicines the morning of the procedure with just enough water to swallow them.  If you have diabetes, make sure that you are scheduled to have your procedure done first thing in the morning, whenever possible.  If you have diabetes, take only half of your insulin dose and notify our nurse that you have done so as soon as you arrive at the clinic.  If you are diabetic, but only take blood sugar pills (oral hypoglycemic), then do not take them on the morning of your procedure.  You may take them after you have had the procedure.  Do not take aspirin or any aspirin-containing medications, at least eleven (11) days prior to the procedure.  They may prolong bleeding.  Wear loose fitting clothing that may be easy to take off and that you would not mind if it got stained with Betadine or blood.  Do not wear any jewelry or perfume  Remove any nail coloring.  It will interfere with some of our monitoring equipment.  NOTE: Remember that this is not meant to be interpreted as a complete list of all possible complications.  Unforeseen problems may occur.  BLOOD THINNERS The following drugs contain aspirin or other products, which can cause increased bleeding during surgery and should not be taken for 2 weeks prior to and 1 week after surgery.  If you should need take something for relief of minor pain, you may take acetaminophen which is found in Tylenol,m Datril, Anacin-3 and Panadol. It is not blood thinner. The products listed below are.  Do not take any of the products listed below in addition to any listed on  your instruction sheet.  A.P.C or A.P.C with Codeine Codeine Phosphate Capsules #3 Ibuprofen Ridaura  ABC compound Congesprin Imuran rimadil  Advil Cope Indocin Robaxisal  Alka-Seltzer Effervescent Pain Reliever and Antacid Coricidin or Coricidin-D  Indomethacin Rufen  Alka-Seltzer plus Cold Medicine Cosprin Ketoprofen S-A-C Tablets  Anacin Analgesic Tablets or Capsules Coumadin Korlgesic Salflex  Anacin Extra Strength Analgesic tablets or capsules CP-2 Tablets Lanoril Salicylate  Anaprox Cuprimine Capsules Levenox Salocol  Anexsia-D Dalteparin Magan Salsalate  Anodynos Darvon compound Magnesium Salicylate Sine-off  Ansaid Dasin Capsules Magsal Sodium Salicylate  Anturane Depen Capsules Marnal Soma  APF Arthritis pain formula Dewitt's Pills Measurin Stanback  Argesic Dia-Gesic Meclofenamic Sulfinpyrazone  Arthritis Bayer Timed Release Aspirin Diclofenac Meclomen Sulindac  Arthritis pain formula Anacin Dicumarol Medipren Supac  Analgesic (Safety coated) Arthralgen Diffunasal Mefanamic Suprofen  Arthritis Strength Bufferin Dihydrocodeine Mepro Compound Suprol  Arthropan liquid Dopirydamole Methcarbomol with Aspirin Synalgos  ASA tablets/Enseals Disalcid Micrainin Tagament  Ascriptin Doan's Midol Talwin  Ascriptin A/D Dolene Mobidin Tanderil  Ascriptin Extra Strength Dolobid Moblgesic Ticlid  Ascriptin with Codeine Doloprin or Doloprin with Codeine Momentum Tolectin  Asperbuf Duoprin Mono-gesic Trendar  Aspergum Duradyne  Motrin or Motrin IB Triminicin  Aspirin plain, buffered or enteric coated Durasal Myochrisine Trigesic  Aspirin Suppositories Easprin Nalfon Trillsate  Aspirin with Codeine Ecotrin Regular or Extra Strength Naprosyn Uracel  Atromid-S Efficin Naproxen Ursinus  Auranofin Capsules Elmiron Neocylate Vanquish  Axotal Emagrin Norgesic Verin  Azathioprine Empirin or Empirin with Codeine Normiflo Vitamin E  Azolid Emprazil Nuprin Voltaren  Bayer Aspirin plain, buffered or  children's or timed BC Tablets or powders Encaprin Orgaran Warfarin Sodium  Buff-a-Comp Enoxaparin Orudis Zorpin  Buff-a-Comp with Codeine Equegesic Os-Cal-Gesic   Buffaprin Excedrin plain, buffered or Extra Strength Oxalid   Bufferin Arthritis Strength Feldene Oxphenbutazone   Bufferin plain or Extra Strength Feldene Capsules Oxycodone with Aspirin   Bufferin with Codeine Fenoprofen Fenoprofen Pabalate or Pabalate-SF   Buffets II Flogesic Panagesic   Buffinol plain or Extra Strength Florinal or Florinal with Codeine Panwarfarin   Buf-Tabs Flurbiprofen Penicillamine   Butalbital Compound Four-way cold tablets Penicillin   Butazolidin Fragmin Pepto-Bismol   Carbenicillin Geminisyn Percodan   Carna Arthritis Reliever Geopen Persantine   Carprofen Gold's salt Persistin   Chloramphenicol Goody's Phenylbutazone   Chloromycetin Haltrain Piroxlcam   Clmetidine heparin Plaquenil   Cllnoril Hyco-pap Ponstel   Clofibrate Hydroxy chloroquine Propoxyphen         Before stopping any of these medications, be sure to consult the physician who ordered them.  Some, such as Coumadin (Warfarin) are ordered to prevent or treat serious conditions such as "deep thrombosis", "pumonary embolisms", and other heart problems.  The amount of time that you may need off of the medication may also vary with the medication and the reason for which you were taking it.  If you are taking any of these medications, please make sure you notify your pain physician before you undergo any procedures.         Sacroiliac (SI) Joint Injection Patient Information  Description: The sacroiliac joint connects the scrum (very low back and tailbone) to the ilium (a pelvic bone which also forms half of the hip joint).  Normally this joint experiences very little motion.  When this joint becomes inflamed or unstable low back and or hip and pelvis pain may result.  Injection of this joint with local anesthetics (numbing medicines)  and steroids can provide diagnostic information and reduce pain.  This injection is performed with the aid of x-ray guidance into the tailbone area while you are lying on your stomach.   You may experience an electrical sensation down the leg while this is being done.  You may also experience numbness.  We also may ask if we are reproducing your normal pain during the injection.  Conditions which may be treated SI injection:   Low back, buttock, hip or leg pain  Preparation for the Injection:  1. Do not eat any solid food or dairy products within 8 hours of your appointment.  2. You may drink clear liquids up to 3 hours before appointment.  Clear liquids include water, black coffee, juice or soda.  No milk or cream please. 3. You may take your regular medications, including pain medications with a sip of water before your appointment.  Diabetics should hold regular insulin (if take separately) and take 1/2 normal NPH dose the morning of the procedure.  Carry some sugar containing items with you to your appointment. 4. A driver must accompany you and be prepared to drive you home after your procedure. 5. Bring all of your current medications with you. 6. An IV  may be inserted and sedation may be given at the discretion of the physician. 7. A blood pressure cuff, EKG and other monitors will often be applied during the procedure.  Some patients may need to have extra oxygen administered for a short period.  8. You will be asked to provide medical information, including your allergies, prior to the procedure.  We must know immediately if you are taking blood thinners (like Coumadin/Warfarin) or if you are allergic to IV iodine contrast (dye).  We must know if you could possible be pregnant.  Possible side effects:   Bleeding from needle site  Infection (rare, may require surgery)  Nerve injury (rare)  Numbness & tingling (temporary)  A brief convulsion or seizure  Light-headedness  (temporary)  Pain at injection site (several days)  Decreased blood pressure (temporary)  Weakness in the leg (temporary)   Call if you experience:   New onset weakness or numbness of an extremity below the injection site that last more than 8 hours.  Hives or difficulty breathing ( go to the emergency room)  Inflammation or drainage at the injection site  Any new symptoms which are concerning to you  Please note:  Although the local anesthetic injected can often make your back/ hip/ buttock/ leg feel good for several hours after the injections, the pain will likely return.  It takes 3-7 days for steroids to work in the sacroiliac area.  You may not notice any pain relief for at least that one week.  If effective, we will often do a series of three injections spaced 3-6 weeks apart to maximally decrease your pain.  After the initial series, we generally will wait some months before a repeat injection of the same type.  If you have any questions, please call [phone] [facility]  Facet Blocks Patient Information  Description: The facets are joints in the spine between the vertebrae.  Like any joints in the body, facets can become irritated and painful.  Arthritis can also effect the facets.  By injecting steroids and local anesthetic in and around these joints, we can temporarily block the nerve supply to them.  Steroids act directly on irritated nerves and tissues to reduce selling and inflammation which often leads to decreased pain.  Facet blocks may be done anywhere along the spine from the neck to the low back depending upon the location of your pain.   After numbing the skin with local anesthetic (like Novocaine), a small needle is passed onto the facet joints under x-ray guidance.  You may experience a sensation of pressure while this is being done.  The entire block usually lasts about 15-25 minutes.   Conditions which may be treated by  facet blocks:   Low back/buttock pain  Neck/shoulder pain  Certain types of headaches  Preparation for the injection:  10. Do not eat any solid food or dairy products within 8 hours of your appointment. 11. You may drink clear liquid up to 3 hours before appointment.  Clear liquids include water, black coffee, juice or soda.  No milk or cream please. 12. You may take your regular medication, including pain medications, with a sip of water before your appointment.  Diabetics should hold regular insulin (if taken separately) and take 1/2 normal NPH dose the morning of the procedure.  Carry some sugar containing items with you to your appointment. 13. A driver must accompany you and be prepared to drive you home after your procedure.  64. Bring all your current medications with you. 15. An IV may be inserted and sedation may be given at the discretion of the physician. 16. A blood pressure cuff, EKG and other monitors will often be applied during the procedure.  Some patients may need to have extra oxygen administered for a short period. 45. You will be asked to provide medical information, including your allergies and medications, prior to the procedure.  We must know immediately if you are taking blood thinners (like Coumadin/Warfarin) or if you are allergic to IV iodine contrast (dye).  We must know if you could possible be pregnant.  Possible side-effects:   Bleeding from needle site  Infection (rare, may require surgery)  Nerve injury (rare)  Numbness & tingling (temporary)  Difficulty urinating (rare, temporary)  Spinal headache (a headache worse with upright posture)  Light-headedness (temporary)  Pain at injection site (serveral days)  Decreased blood pressure (rare, temporary)  Weakness in arm/leg (temporary)  Pressure sensation in back/neck (temporary)   Call if you experience:   Fever/chills associated with headache or increased back/neck pain  Headache  worsened by an upright position  New onset, weakness or numbness of an extremity below the injection site  Hives or difficulty breathing (go to the emergency room)  Inflammation or drainage at the injection site(s)  Severe back/neck pain greater than usual  New symptoms which are concerning to you  Please note:  Although the local anesthetic injected can often make your back or neck feel good for several hours after the injection, the pain will likely return. It takes 3-7 days for steroids to work.  You may not notice any pain relief for at least one week.  If effective, we will often do a series of 2-3 injections spaced 3-6 weeks apart to maximally decrease your pain.  After the initial series, you may be a candidate for a more permanent nerve block of the facets.  If you have any questions, please call #[phone]) [facility]  Initial Gabapentin Titration  Medication used: Gabapentin (Generic Name) or Neurontin (Brand Name) 100 mg tablets/capsules  Reasons to stop increasing the dose:  Reason 1: You get good relief of symptoms, in which case there is no need to increase the daily dose any further.    Reason 2: You develop some side effects, such as sleeping all of the time, difficulty concentrating, or becoming disoriented, in which case you need to go down on the dose, to the prior level, where you were not experiencing any side effects. Stay on that dose longer, to allow more time for your body to get use it, before attempting to increase it again.   Steps: Step 1: Start by taking 1 (one) tablet at bedtime x 7 (seven) days.  Step 2: After being on 1 (one) tablet for 7 (seven) days, then increase it to 2 (two) tablets at bedtime for another 7 (seven) days.  Step 3: Next, after being on 2 (two) tablets at bedtime for 7 (seven) days, then increase it to 3 (three) tablets at bedtime, and stay on that dose until you see your doctor.  Reasons to  stop increasing the dose: Reason 1: You get good relief of symptoms, in which case there is no need to increase the daily dose any further.  Reason 2: You develop some side effects, such as sleeping all of the time, difficulty concentrating, or becoming disoriented, in which case you need to go down on the  dose, to the prior level, where you were not experiencing any side effects. Stay on that dose longer, to allow more time for your body to get use it, before attempting to increase it again.  Endpoint: Once you have reached the maximum dose you can tolerate without side-effects, contact your physician so as to evaluate the results of the regimen.   Questions: Feel free to contact us for any questions or problems at [phone]

## 2016-04-04 LAB — TOXASSURE SELECT 13 (MW), URINE

## 2016-04-05 ENCOUNTER — Telehealth: Payer: Self-pay

## 2016-04-05 NOTE — Telephone Encounter (Signed)
[facility] called, they said they received a prior auth for this pt but there is no ICD 10 or NPI of the doctor and facility Please call [phone]

## 2016-04-22 ENCOUNTER — Ambulatory Visit: Payer: PPO | Attending: Pain Medicine | Admitting: Pain Medicine

## 2016-04-22 ENCOUNTER — Encounter: Payer: Self-pay | Admitting: Pain Medicine

## 2016-04-22 ENCOUNTER — Telehealth: Payer: Self-pay

## 2016-04-22 VITALS — BP 85/58 | HR 103 | Temp 98.0°F | Resp 16 | Ht 74.0 in | Wt 175.0 lb

## 2016-04-22 DIAGNOSIS — Z981 Arthrodesis status: Secondary | ICD-10-CM | POA: Diagnosis not present

## 2016-04-22 DIAGNOSIS — E785 Hyperlipidemia, unspecified: Secondary | ICD-10-CM | POA: Diagnosis not present

## 2016-04-22 DIAGNOSIS — M961 Postlaminectomy syndrome, not elsewhere classified: Secondary | ICD-10-CM | POA: Insufficient documentation

## 2016-04-22 DIAGNOSIS — Z79899 Other long term (current) drug therapy: Secondary | ICD-10-CM | POA: Insufficient documentation

## 2016-04-22 DIAGNOSIS — M545 Low back pain: Secondary | ICD-10-CM | POA: Diagnosis not present

## 2016-04-22 DIAGNOSIS — Z79891 Long term (current) use of opiate analgesic: Secondary | ICD-10-CM | POA: Diagnosis not present

## 2016-04-22 DIAGNOSIS — G629 Polyneuropathy, unspecified: Secondary | ICD-10-CM | POA: Insufficient documentation

## 2016-04-22 DIAGNOSIS — Z7982 Long term (current) use of aspirin: Secondary | ICD-10-CM | POA: Diagnosis not present

## 2016-04-22 DIAGNOSIS — M488X6 Other specified spondylopathies, lumbar region: Secondary | ICD-10-CM | POA: Diagnosis not present

## 2016-04-22 DIAGNOSIS — F17210 Nicotine dependence, cigarettes, uncomplicated: Secondary | ICD-10-CM | POA: Insufficient documentation

## 2016-04-22 DIAGNOSIS — I2510 Atherosclerotic heart disease of native coronary artery without angina pectoris: Secondary | ICD-10-CM | POA: Insufficient documentation

## 2016-04-22 DIAGNOSIS — M5136 Other intervertebral disc degeneration, lumbar region: Secondary | ICD-10-CM | POA: Insufficient documentation

## 2016-04-22 DIAGNOSIS — I714 Abdominal aortic aneurysm, without rupture: Secondary | ICD-10-CM | POA: Insufficient documentation

## 2016-04-22 DIAGNOSIS — M1288 Other specific arthropathies, not elsewhere classified, other specified site: Secondary | ICD-10-CM | POA: Diagnosis not present

## 2016-04-22 DIAGNOSIS — Z86718 Personal history of other venous thrombosis and embolism: Secondary | ICD-10-CM | POA: Insufficient documentation

## 2016-04-22 DIAGNOSIS — M792 Neuralgia and neuritis, unspecified: Secondary | ICD-10-CM | POA: Diagnosis not present

## 2016-04-22 DIAGNOSIS — Z8249 Family history of ischemic heart disease and other diseases of the circulatory system: Secondary | ICD-10-CM | POA: Insufficient documentation

## 2016-04-22 DIAGNOSIS — G8929 Other chronic pain: Secondary | ICD-10-CM | POA: Diagnosis not present

## 2016-04-22 DIAGNOSIS — F1190 Opioid use, unspecified, uncomplicated: Secondary | ICD-10-CM | POA: Diagnosis not present

## 2016-04-22 DIAGNOSIS — Z8546 Personal history of malignant neoplasm of prostate: Secondary | ICD-10-CM | POA: Diagnosis not present

## 2016-04-22 DIAGNOSIS — G894 Chronic pain syndrome: Secondary | ICD-10-CM | POA: Diagnosis not present

## 2016-04-22 DIAGNOSIS — M47816 Spondylosis without myelopathy or radiculopathy, lumbar region: Secondary | ICD-10-CM | POA: Diagnosis not present

## 2016-04-22 DIAGNOSIS — M533 Sacrococcygeal disorders, not elsewhere classified: Secondary | ICD-10-CM | POA: Diagnosis not present

## 2016-04-22 DIAGNOSIS — M5442 Lumbago with sciatica, left side: Secondary | ICD-10-CM | POA: Diagnosis not present

## 2016-04-22 DIAGNOSIS — M5441 Lumbago with sciatica, right side: Secondary | ICD-10-CM

## 2016-04-22 DIAGNOSIS — Z5181 Encounter for therapeutic drug level monitoring: Secondary | ICD-10-CM | POA: Insufficient documentation

## 2016-04-22 MED ORDER — OXYCODONE HCL 10 MG PO TABS: 10.0000 mg | ORAL_TABLET | Freq: Every day | ORAL | 0 refills | Status: DC

## 2016-04-22 MED ORDER — OXYCODONE HCL 5 MG PO TABS: 5.0000 mg | ORAL_TABLET | Freq: Every day | ORAL | 0 refills | Status: DC

## 2016-04-22 MED ORDER — GABAPENTIN 100 MG PO CAPS: 100.0000 mg | ORAL_CAPSULE | Freq: Four times a day (QID) | ORAL | 1 refills | Status: DC

## 2016-04-22 MED ORDER — OXYCODONE HCL 5 MG PO TABS: 5.0000 mg | ORAL_TABLET | Freq: Two times a day (BID) | ORAL | 0 refills | Status: DC

## 2016-04-22 MED ORDER — OXYCODONE HCL 5 MG PO TABS: 5.0000 mg | ORAL_TABLET | Freq: Four times a day (QID) | ORAL | 0 refills | Status: DC

## 2016-04-22 MED ORDER — OXYCODONE HCL 5 MG PO TABS: 5.0000 mg | ORAL_TABLET | Freq: Three times a day (TID) | ORAL | 0 refills | Status: DC

## 2016-04-22 NOTE — Patient Instructions (Addendum)
Pain Management Discharge Instructions  General Discharge Instructions :  If you need to reach your doctor call: Monday-Friday 8:00 am - 4:00 pm at [phone] or toll free [phone].  After clinic hours [phone] to have operator reach doctor.  Bring all of your medication bottles to all your appointments in the pain clinic.  To cancel or reschedule your appointment with Pain Management please remember to call 24 hours in advance to avoid a fee.  Refer to the educational materials which you have been given on: General Risks, I had my Procedure. Discharge Instructions, Post Sedation.  Post Procedure Instructions:  The drugs you were given will stay in your system until tomorrow, so for the next 24 hours you should not drive, make any legal decisions or drink any alcoholic beverages.  You may eat anything you prefer, but it is better to start with liquids then soups and crackers, and gradually work up to solid foods.  Please notify your doctor immediately if you have any unusual bleeding, trouble breathing or pain that is not related to your normal pain.  Depending on the type of procedure that was done, some parts of your body may feel week and/or numb.  This usually clears up by tonight or the next day.  Walk with the use of an assistive device or accompanied by an adult for the 24 hours.  You may use ice on the affected area for the first 24 hours.  Put ice in a Ziploc bag and cover with a towel and place against area 15 minutes on 15 minutes off.  You may switch to heat after 24 hours.  Gabapentin Titration  Medication used: Gabapentin (Generic Name) or Neurontin (Brand Name) 100 mg tablets/capsules  Reasons to stop increasing the dose:  Reason 1: You get good relief of symptoms, in which case there is no need to increase the daily dose any further.    Reason 2: You develop some side effects, such as sleeping all of the time, difficulty concentrating, or becoming  disoriented, in which case you need to go down on the dose, to the prior level, where you were not experiencing any side effects. Stay on that dose longer, to allow more time for your body to get use it, before attempting to increase it again.   Steps to increase medication: Step 1: Start by taking 1 (one) tablet at bedtime x 7 (seven) days.  Step 2: Increase dose to 2 (two) tablets at bedtime. Stay on this dose x 7 (seven) days.  Step 3: Next increase it to 3 (three) tablets at bedtime. Stay on this dose x another 7 (seven) days.  Step 4: Next, add 1 (one) tablet at noon with lunch. Continue this dose x another 7 (seven) days.  Step 5: Add 1 (one) tablet in the afternoon with dinner. Stay on this dose x another 7 (seven) days.  Step 6: At this point you should be taking the medicine 4 (four) times a day. This daily regimen of taking the medicine 4 (four) times a day, will be maintained from now on. You should not take any doses any sooner than every 6 (six) hours.  Step 7: After 7 (seven) days of taking 3 (three) tablet at bedtime, 1 (one) tablet at noon, 1 (one) tablet in the afternoon, and 1 (one) tablet in the morning, begin taking 2 (two) tablets at noon with lunch. Stay on this dose x another 7 (seven) days.   Step 8: After 7 (seven) days  of taking 3 (three) tablet at bedtime, 2 (two) tablets at noon, 1 (one) tablet in the afternoon, and 1 (one) tablet in the morning, begin taking 2 (two) tablets in the afternoon with dinner. Stay on this dose x another 7 (seven) days.   Step 9: After 7 (seven) days of taking 3 (three) tablet at bedtime, 2 (two) tablets at noon, 2 (two) tablets in the afternoon, and 1 (one) tablet in the morning, begin taking 2 (two) tablets in the morning with breakfast. Stay on this dose x another 7 (seven) days. At this point you should be taking the medicine 4 (four) times a day, or about every 6 (six) hours. This daily regimen of taking the medicine 4 (four) times a  day, will be maintained from now on. You should not take any doses any sooner than every 6 (six) hours.  Step 10: After 7 (seven) days of taking 3 (three) tablet at bedtime, 2 (two) tablets at noon, 2 (two) tablets in the afternoon, and 2 (two) tablets in the morning, begin taking 3 (three) tablets at noon with lunch. Stay on this dose x another 7 (seven) days.   Step 11: After 7 (seven) days of taking 3 (three) tablet at bedtime, 3 (three) tablets at noon, 2 (two) tablets in the afternoon, and 2 (two) tablets in the morning, begin taking 3 (three) tablets in the afternoon with dinner. Stay on this dose x another 7 (seven) days.   Step 12: After 7 (seven) days of taking 3 (three) tablet at bedtime, 3 (three) tablets at noon, 3 (three) tablets in the afternoon, and 2 (two) tablet in the morning, begin taking 3 (three) tablets in the morning with breakfast. Stay on this dose x another 7 (seven) days. At this point you should be taking the medicine 4 (four) times a day, or about every 6 (six) hours. This daily regimen of taking the medicine 4 (four) times a day, will be maintained from now on.   Endpoint: Once you have reached the maximum dose you can tolerate without side-effects, contact your physician so as to evaluate the results of the regimen.   Questions: Feel free to contact us for any questions or problems at [phone]

## 2016-04-22 NOTE — Progress Notes (Addendum)
Patient's Name: Justin Harrington  MRN: [id number]  Referring Provider: [name], MD  DOB: Aug 27, 1945  PCP: [name], MD  DOS: 04/22/2016  Note by: Kathlen Brunswick. Dossie Arbour, MD  Service setting: Ambulatory outpatient  Specialty: Interventional Pain Management  Location: [facility] (AMB) Pain Management Facility    Patient type: Established   Primary Reason(s) for Visit: Encounter for prescription drug management (Level of risk: moderate) CC: Back Pain (mid-lower)  HPI  Justin Harrington is a 71 y.o. year old, male patient, who comes today for a medication management evaluation. He has Carotid arterial disease ([place]); Cataract of left eye; Diverticulosis of colon without hemorrhage; Elevated PSA; History of DVT (deep vein thrombosis); Insomnia; History of prostate cancer; Arthrodesis status; Smoking greater than 30 pack years; Hypercholesteremia; Hypertension; Opioid-induced constipation (OIC); Long term current use of opiate analgesic; Long term prescription opiate use; Opiate use (180 MME/Day); Encounter for therapeutic drug level monitoring; Chronic low back pain (Location of Primary Source of Pain) (Bilateral) (R>L); Failed back surgical syndrome; History of lumbar fusion (L4-S1 posterior hardware); Lumbar spondylosis; Lumbar facet syndrome (Bilateral) (R>L); Hypokalemia; Hypomagnesemia; Chronic sacroiliac joint pain (Bilateral) (R>L); Chronic hip pain (Bilateral) (R>L); Chronic pain syndrome; Bilateral carotid artery stenosis; AAA (abdominal aortic aneurysm) without rupture ([place]); Urinary hesitancy; H/O prostatectomy; Neurogenic pain; and Neuropathic pain on his problem list. His primarily concern today is the Back Pain (mid-lower)  Pain Assessment: Self-Reported Pain Score: 5 /10 Clinically the patient looks like a 2/10 Reported level is inconsistent with clinical observations. Information on the proper use of the pain scale provided to the patient today Pain Type: Chronic pain Pain Location:  Back Pain Orientation: Mid, Lower Pain Descriptors / Indicators: Aching, Constant Pain Frequency: Constant  Justin Harrington was last scheduled for an appointment on 03/30/2016 for medication management. During today's appointment we reviewed Justin Harrington's chronic pain status, as well as his outpatient medication regimen. The patient indicates being surprised at the fact that he has not experienced any type of rolls with the taper that we have given him. He is also very happy that he has been able to come off of all of these medications. He is not having any side effects or any temporal complications except for the fact that he is having some additional low back pain. He was reminded that he has when necessary procedures that he can use should he have a flareup of his low back pain. In addition, he was able to tolerate the gabapentin well without any type of complications and therefore we will continue to titrate this medication up but still rated. Today we have provided him with some written instructions on how to do this and a new prescription so that he can start using some of this medication during the day. Today he has been given several prescriptions for the oxycodone to continue tapering the medication down. We will see him back in approximately 6 weeks to continue to downward taper.  The patient  reports that he does not use drugs. His body mass index is 22.47 kg/m.  Further details on both, my assessment(s), as well as the proposed treatment plan, please see below.  Controlled Substance Pharmacotherapy Assessment REMS (Risk Evaluation and Mitigation Strategy)  Original Analgesic, before taper:Oxycodone IR 30 mg every 6 hours (120 mg/dayof oxycodone) MME/day:146m/day  Analgesic: Oxycodone IR 15 mg 5 times a day (75 mg per day of oxycodone) MME/day: 112.5 mg/day.  Justin Specking RN  04/22/2016  8:44 AM  Signed Nursing Pain Medication  Assessment:  Safety precautions to be maintained  throughout the outpatient stay will include: orient to surroundings, keep bed in low position, maintain call bell within reach at all times, provide assistance with transfer out of bed and ambulation.  Medication Inspection Compliance: Pill count conducted under aseptic conditions, in front of the patient. Neither the pills nor the bottle was removed from the patient's sight at any time. Once count was completed pills were immediately returned to the patient in their original bottle. Pill Count: 96/165 of 165 pills remain Bottle Appearance: Standard pharmacy container. Clearly labeled. Medication: See above Filled Date: 03 / 10 / 2018   Pharmacokinetics: Liberation and absorption (onset of action): WNL Distribution (time to peak effect): WNL Metabolism and excretion (duration of action): WNL         Pharmacodynamics: Desired effects: Analgesia: Justin Harrington reports >50% benefit. Functional ability: Patient reports that medication allows him to accomplish basic ADLs Clinically meaningful improvement in function (CMIF): Sustained CMIF goals met Perceived effectiveness: Described as relatively effective, allowing for increase in activities of daily living (ADL) Undesirable effects: Side-effects or Adverse reactions: None reported Monitoring:  PMP: Online review of the past 28-monthperiod conducted. Compliant with practice rules and regulations List of all UDS test(s) done:  Lab Results  Component Value Date   TOXASSSELUR FINAL 03/30/2016   SUMMARY FINAL 10/16/2015   Last UDS on record: ToxAssure Select 13  Date Value Ref Range Status  03/30/2016 FINAL  Final    Comment:    ==================================================================== TOXASSURE SELECT 13 (MW) ==================================================================== Test                             Result       Flag       Units Drug Present and Declared for Prescription Verification   Oxycodone                       >2632        EXPECTED   ng/mg creat   Oxymorphone                    >2632        EXPECTED   ng/mg creat   Noroxycodone                   >2632        EXPECTED   ng/mg creat   Noroxymorphone                 >2632        EXPECTED   ng/mg creat    Sources of oxycodone are scheduled prescription medications.    Oxymorphone, noroxycodone, and noroxymorphone are expected    metabolites of oxycodone. Oxymorphone is also available as a    scheduled prescription medication. ==================================================================== Test                      Result    Flag   Units      Ref Range   Creatinine              380              mg/dL      >=20 ==================================================================== Declared Medications:  The flagging and interpretation on this report are based on the  following declared medications.  Unexpected results may arise from  inaccuracies in the declared medications.  **  Note: The testing scope of this panel includes these medications:  Oxycodone  **Note: The testing scope of this panel does not include following  reported medications:  Aspirin  Atorvastatin  Hydrochlorothiazide  Lubiprostone  Naloxone  Supplement  Trazodone ==================================================================== For clinical consultation, please call [phone]. ====================================================================    UDS interpretation: Compliant          Medication Assessment Form: Reviewed. Patient indicates being compliant with therapy Treatment compliance: Compliant Risk Assessment Profile: Aberrant behavior: See prior evaluations. None observed or detected today Comorbid factors increasing risk of overdose: See prior notes. No additional risks detected today Risk of substance use disorder (SUD): Low Opioid Risk Tool (ORT) Total Score: 0  Interpretation Table:  Score <3 = Low Risk for SUD  Score between 4-7 = Moderate  Risk for SUD  Score >8 = High Risk for Opioid Abuse   Risk Mitigation Strategies:  Patient Counseling: Covered Patient-Prescriber Agreement (PPA): Present and active  Notification to other healthcare providers: Done  Pharmacologic Plan: No change in therapy, at this time  Laboratory Chemistry  Inflammation Markers Lab Results  Component Value Date   CRP 2.7 (H) 10/16/2015   ESRSEDRATE 1 10/16/2015   (CRP: Acute Phase) (ESR: Chronic Phase) Renal Function Markers Lab Results  Component Value Date   BUN 19 03/15/2016   CREATININE 1.21 03/15/2016   GFRAA 70 03/15/2016   GFRNONAA 61 03/15/2016   Hepatic Function Markers Lab Results  Component Value Date   AST 16 03/15/2016   ALT 18 03/15/2016   ALBUMIN 4.5 03/15/2016   ALKPHOS 64 03/15/2016   Electrolytes Lab Results  Component Value Date   NA 141 03/15/2016   K 4.8 03/15/2016   CL 96 03/15/2016   CALCIUM 9.4 03/15/2016   MG 1.9 03/15/2016   Neuropathy Markers Lab Results  Component Value Date   VITAMINB12 206 10/16/2015   Bone Pathology Markers Lab Results  Component Value Date   ALKPHOS 64 03/15/2016   25OHVITD1 45 10/16/2015   25OHVITD2 <1.0 10/16/2015   25OHVITD3 44 10/16/2015   CALCIUM 9.4 03/15/2016   Coagulation Parameters Lab Results  Component Value Date   INR 1.0 12/03/2011   LABPROT 13.2 12/03/2011   APTT 28.5 12/03/2011   PLT 173 12/03/2011   Cardiovascular Markers Lab Results  Component Value Date   HGB 14.5 12/03/2011   HCT 42.7 12/03/2011   Note: Lab results reviewed.  Recent Diagnostic Imaging Review  Dg Si Joints Result Date: 03/30/2016 CLINICAL DATA:  71 year old male post lumbar fusion 1998 with lower back pain extending into right hip since surgery. Post prostatectomy for prostate cancer. Initial encounter. EXAM: BILATERAL SACROILIAC JOINTS - 3+ VIEW COMPARISON:  08/03/2012 CT. FINDINGS: Post fusion lower lumbar spine/upper sacrum. Bone donor site right iliac wing. Very mild  right sacroiliac joint degenerative changes. Vascular calcifications. IMPRESSION: Post fusion lower lumbar spine/upper sacrum. Very mild right sacroiliac joint degenerative changes. Electronically Signed   By: Genia Del M.D.   On: 03/30/2016 11:24   Dg Hip Unilat W Or W/o Pelvis 2-3 Views Left Result Date: 03/30/2016 CLINICAL DATA:  71 year old male post lumbar fusion 1998 with lower back pain extending into right hip since surgery. Post prostatectomy for prostate cancer. Initial encounter. EXAM: DG HIP (WITH OR WITHOUT PELVIS) 2-3V LEFT COMPARISON:  08/03/2012 CT. FINDINGS: No fracture or dislocation. No plain film evidence of left femoral head avascular necrosis. Preservation joint space. Lucency left ischium unchanged. Bony overgrowth ischium bilaterally unchanged. Postsurgical changes lower lumbar spine. No  sclerotic focus to suggest metastatic disease. Abdominal aortic aneurysm incompletely assessed on present exam measuring up to 3.7 cm. IMPRESSION: No significant left hip joint degenerative changes or evidence of avascular necrosis. Abdominal aortic aneurysm incompletely assessed on present exam measuring up to 3.7 cm. Electronically Signed   By: Genia Del M.D.   On: 03/30/2016 11:19   Dg Hip Unilat W Or W/o Pelvis 2-3 Views Right Result Date: 03/30/2016 CLINICAL DATA:  71 year old male post lumbar fusion 1998 with lower back pain extending into right hip since surgery. Post prostatectomy for prostate cancer. Initial encounter. EXAM: DG HIP (WITH OR WITHOUT PELVIS) 2-3V RIGHT COMPARISON:  08/03/2012 CT. FINDINGS: Minimal right hip joint degenerative changes. No plain film evidence of right femoral head avascular necrosis. Postsurgical changes lumbar spine. 3.7 cm abdominal aortic aneurysm incompletely assessed. No sclerotic foci to suggest osseous metastatic disease. IMPRESSION: Minimal right hip joint degenerative changes. 3.7 cm abdominal aortic aneurysm incompletely assessed. Electronically  Signed   By: Genia Del M.D.   On: 03/30/2016 11:21   Note: Imaging results reviewed.          Meds  The patient has a current medication list which includes the following prescription(s): aspirin ec, atorvastatin, gabapentin, hydrochlorothiazide, lubiprostone, naloxone, oxycodone, oxycodone, oxycodone, oxycodone, oxycodone, oxycodone hcl, trazodone, and benefiber.  Current Outpatient Prescriptions on File Prior to Visit  Medication Sig  . aspirin EC 81 MG tablet Take 1 tablet by mouth daily.  Marland Kitchen atorvastatin (LIPITOR) 80 MG tablet Take 1 tablet (80 mg total) by mouth daily.  . hydrochlorothiazide (HYDRODIURIL) 25 MG tablet Take 1 tablet (25 mg total) by mouth daily.  Marland Kitchen lubiprostone (AMITIZA) 24 MCG capsule Take 1 capsule (24 mcg total) by mouth 2 (two) times daily with a meal. Swallow the medication whole. Do not break or chew the medication.  . naloxone [facility]) 2 MG/2ML injection   . traZODone (DESYREL) 150 MG tablet Take 1 tablet (150 mg total) by mouth at bedtime.  . Wheat Dextrin (BENEFIBER) POWD Stir 2 tsp. TID into 4-8 oz of any non-carbonated beverage or soft food (hot or cold)   No current facility-administered medications on file prior to visit.    ROS  Constitutional: Denies any fever or chills Gastrointestinal: No reported hemesis, hematochezia, vomiting, or acute GI distress Musculoskeletal: Denies any acute onset joint swelling, redness, loss of ROM, or weakness Neurological: No reported episodes of acute onset apraxia, aphasia, dysarthria, agnosia, amnesia, paralysis, loss of coordination, or loss of consciousness  Allergies  Mr. Doo has No Known Allergies.  [place]  Drug: Mr. Almeda  reports that he does not use drugs. Alcohol:  reports that he does not drink alcohol. Tobacco:  reports that he has been smoking Cigarettes.  He has a 37.50 pack-year smoking history. He has never used smokeless tobacco. Medical:  has a past medical history of Aortic aneurysm ([place]);  CAD (coronary artery disease); DDD (degenerative disc disease), lumbar; Hyperlipidemia; Personal history of tobacco use, presenting hazards to health (06/12/2015); Personal history of tobacco use, presenting hazards to health (06/12/2015); and Prostate cancer ([place]). Family: family history includes Cerebral palsy in his son; Congestive Heart Failure in his father; Heart attack in his mother.  Past Surgical History:  Procedure Laterality Date  . BACK SURGERY  1998   Lumbar spine fusion  . carotid doppler ultrasound  10/05/2011   75-90% RCA occlusion, 50% on left  . CAROTID ENDARTERECTOMY Right 12/02/2011   Dr. Lucky Cowboy  . CHOLECYSTECTOMY  10/02/2010   Laparoscopic,  Dr. Pat Patrick, [facility]  . 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 Cholelithiasis and acute cholecystiis. Mild enlargement of left adrenal gland  . PROSTATE SURGERY  10/19/2012   prostatectomy. [facility]; Laser assisted, Done by [name] for prostate cancer   Constitutional Exam  General appearance: Well nourished, well developed, and well hydrated. In no apparent acute distress Vitals:   04/22/16 0806  BP: (!) 85/58  Pulse: (!) 103  Resp: 16  Temp: 98 F (36.7 C)  SpO2: 97%  Weight: 175 lb (79.4 kg)  Height: '6\' 2"'  (1.88 m)   BMI Assessment: Estimated body mass index is 22.47 kg/m as calculated from the following:   Height as of this encounter: '6\' 2"'  (1.88 m).   Weight as of this encounter: 175 lb (79.4 kg).  BMI interpretation table: BMI level Category Range association with higher incidence of chronic pain  <18 kg/m2 Underweight   18.5-24.9 kg/m2 Ideal body weight   25-29.9 kg/m2 Overweight Increased incidence by 20%  30-34.9 kg/m2 Obese (Class I) Increased incidence by 68%  35-39.9 kg/m2 Severe obesity (Class II) Increased incidence by 136%  >40 kg/m2 Extreme obesity (Class [place]) Increased incidence by 254%   BMI Readings from Last 4 Encounters:   04/22/16 22.47 kg/m  03/30/16 22.47 kg/m  03/15/16 24.01 kg/m  03/03/16 22.47 kg/m   Wt Readings from Last 4 Encounters:  04/22/16 175 lb (79.4 kg)  03/30/16 175 lb (79.4 kg)  03/15/16 182 lb (82.6 kg)  03/03/16 175 lb (79.4 kg)  Psych/Mental status: Alert, oriented x 3 (person, place, & time)       Eyes: PERLA Respiratory: No evidence of acute respiratory distress  Cervical Spine Exam  Inspection: No masses, redness, or swelling Alignment: Symmetrical Functional ROM: Unrestricted ROM Stability: No instability detected Muscle strength & Tone: Functionally intact Sensory: Unimpaired Palpation: No palpable anomalies  Upper Extremity (UE) Exam    Side: Right upper extremity  Side: Left upper extremity  Inspection: No masses, redness, swelling, or asymmetry. No contractures  Inspection: No masses, redness, swelling, or asymmetry. No contractures  Functional ROM: Unrestricted ROM          Functional ROM: Unrestricted ROM          Muscle strength & Tone: Functionally intact  Muscle strength & Tone: Functionally intact  Sensory: Unimpaired  Sensory: Unimpaired  Palpation: No palpable anomalies  Palpation: No palpable anomalies  Specialized Test(s): Deferred         Specialized Test(s): Deferred          Thoracic Spine Exam  Inspection: No masses, redness, or swelling Alignment: Symmetrical Functional ROM: Unrestricted ROM Stability: No instability detected Sensory: Unimpaired Muscle strength & Tone: No palpable anomalies  Lumbar Spine Exam  Inspection: No masses, redness, or swelling Alignment: Symmetrical Functional ROM: Unrestricted ROM Stability: No instability detected Muscle strength & Tone: Functionally intact Sensory: Unimpaired Palpation: No palpable anomalies Provocative Tests: Lumbar Hyperextension and rotation test: evaluation deferred today       Patrick's Maneuver: evaluation deferred today              Gait & Posture Assessment  Ambulation: Patient  ambulates using a cane Gait: Limited. Using assistive device to ambulate Posture: WNL   Lower Extremity Exam    Side: Right lower extremity  Side: Left lower extremity  Inspection: No masses, redness, swelling, or asymmetry. No contractures  Inspection: No masses, redness, swelling, or asymmetry.  No contractures  Functional ROM: Unrestricted ROM          Functional ROM: Unrestricted ROM          Muscle strength & Tone: Functionally intact  Muscle strength & Tone: Functionally intact  Sensory: Unimpaired  Sensory: Unimpaired  Palpation: No palpable anomalies  Palpation: No palpable anomalies   Assessment  Primary Diagnosis & Pertinent Problem List: The primary encounter diagnosis was Chronic pain syndrome. Diagnoses of Chronic low back pain (Location of Primary Source of Pain) (Bilateral) (R>L), Lumbar facet syndrome (Bilateral) (R>L), Chronic sacroiliac joint pain (Bilateral) (R>L), Lumbar spondylosis, Failed back surgical syndrome, Long term prescription opiate use, Opiate use (180 MME/Day), Neurogenic pain, and Neuropathic pain were also pertinent to this visit.  Status Diagnosis  Controlled Controlled Controlled 1. Chronic pain syndrome   2. Chronic low back pain (Location of Primary Source of Pain) (Bilateral) (R>L)   3. Lumbar facet syndrome (Bilateral) (R>L)   4. Chronic sacroiliac joint pain (Bilateral) (R>L)   5. Lumbar spondylosis   6. Failed back surgical syndrome   7. Long term prescription opiate use   8. Opiate use (180 MME/Day)   9. Neurogenic pain   10. Neuropathic pain      Plan of Care  Pharmacotherapy (Medications Ordered): Meds ordered this encounter  Medications  . Oxycodone HCl 10 MG TABS    Sig: Take 1 tablet (10 mg total) by mouth 5 (five) times daily.    Dispense:  210 tablet    Refill:  0    This prescription along with the 5 mg pills are part of an opioid downward tapering schedule and must be followed exactly as written. Do not fill until:  05/10/16 To last until: 06/21/16  . oxyCODONE (OXY IR/ROXICODONE) 5 MG immediate release tablet    Sig: Take 1 tablet (5 mg total) by mouth 5 (five) times daily. Max: 5/day    Dispense:  35 tablet    Refill:  0    This is an opioid taper. Provide patient with medication in the exact order and day as requested. Patient may have prescription filled one day early if pharmacy is closed on scheduled refill date. Do not fill until: 05/10/16 To last until: 05/17/16  . oxyCODONE (OXY IR/ROXICODONE) 5 MG immediate release tablet    Sig: Take 1 tablet (5 mg total) by mouth 4 (four) times daily. Max: 4/day    Dispense:  28 tablet    Refill:  0    This is an opioid taper. Provide patient with medication in the exact order and day as requested. Patient may have prescription filled one day early if pharmacy is closed on scheduled refill date. Do not fill until: 05/17/16 To last until: 05/24/16  . oxyCODONE (OXY IR/ROXICODONE) 5 MG immediate release tablet    Sig: Take 1 tablet (5 mg total) by mouth 3 (three) times daily. Max: 3/day    Dispense:  21 tablet    Refill:  0    This is an opioid taper. Provide patient with medication in the exact order and day as requested. Patient may have prescription filled one day early if pharmacy is closed on scheduled refill date. Do not fill until: 05/24/16 To last until: 05/31/16  . oxyCODONE (OXY IR/ROXICODONE) 5 MG immediate release tablet    Sig: Take 1 tablet (5 mg total) by mouth 2 (two) times daily. Max: 2/day    Dispense:  14 tablet    Refill:  0    This  is an opioid taper. Provide patient with medication in the exact order and day as requested. Patient may have prescription filled one day early if pharmacy is closed on scheduled refill date. Do not fill until: 05/31/16 To last until: 06/07/16  . oxyCODONE (OXY IR/ROXICODONE) 5 MG immediate release tablet    Sig: Take 1 tablet (5 mg total) by mouth daily. Max: 1/day    Dispense:  7 tablet    Refill:  0     This is an opioid taper. Provide patient with medication in the exact order and day as requested. Patient may have prescription filled one day early if pharmacy is closed on scheduled refill date. Do not fill until: 06/07/16 To last until: 06/14/16  . gabapentin (NEURONTIN) 100 MG capsule    Sig: Take 1-3 capsules (100-300 mg total) by mouth 4 (four) times daily. Follow written titration schedule.    Dispense:  360 capsule    Refill:  1    Do not place this medication, or any other prescription from our practice, on "Automatic Refill". Patient may have prescription filled one day early if pharmacy is closed on scheduled refill date.   New Prescriptions   OXYCODONE (OXY IR/ROXICODONE) 5 MG IMMEDIATE RELEASE TABLET    Take 1 tablet (5 mg total) by mouth 5 (five) times daily. Max: 5/day   OXYCODONE (OXY IR/ROXICODONE) 5 MG IMMEDIATE RELEASE TABLET    Take 1 tablet (5 mg total) by mouth 4 (four) times daily. Max: 4/day   OXYCODONE (OXY IR/ROXICODONE) 5 MG IMMEDIATE RELEASE TABLET    Take 1 tablet (5 mg total) by mouth 3 (three) times daily. Max: 3/day   OXYCODONE (OXY IR/ROXICODONE) 5 MG IMMEDIATE RELEASE TABLET    Take 1 tablet (5 mg total) by mouth 2 (two) times daily. Max: 2/day   OXYCODONE (OXY IR/ROXICODONE) 5 MG IMMEDIATE RELEASE TABLET    Take 1 tablet (5 mg total) by mouth daily. Max: 1/day   OXYCODONE HCL 10 MG TABS    Take 1 tablet (10 mg total) by mouth 5 (five) times daily.   Medications administered today: Mr. Runnion had no medications administered during this visit. Lab-work, procedure(s), and/or referral(s): No orders of the defined types were placed in this encounter.  Imaging and/or referral(s): None  Interventional therapies: Planned, scheduled, and/or pending:   Not at this time.   Considering:   Diagnostic bilateral Lumbarfacet block +sacroiliac joint block #2 Diagnostic bilateral lumbar facet block Possible bilateral lumbar facet radiofrequency ablation Diagnostic  bilateral sacroiliac joint injection  Possible bilateral sacroiliac joint radiofrequency ablation. Diagnostic bilateral intra-articular hip joint injection  Possible bilateral hip joint radiofrequency ablation. Caudal epidural steroid injection + epidurogram.  Possible Racz procedure.    Palliative PRN treatment(s):   Palliative bilateral Lumbarfacet block +sacroiliac joint block #2  Palliative bilateral lumbar facet block Palliative bilateral sacroiliac joint injection  Diagnostic Caudal epidural steroid injection + epidurogram.    Provider-requested follow-up: Return in about 6 weeks (around 06/03/2016) for (MD) Med-Mgmt, in addition, (PRN) procedure.  Future Appointments Date Time Provider [place]  06/03/2016 8:20 AM Vevelyn Francois, NP [facility] None  06/18/2016 8:30 AM [facility] None  06/18/2016 9:30 AM Algernon Huxley, MD [facility] None  12/17/2016 9:30 AM [facility] VASC 1 AVVS-IMG None  12/17/2016 10:30 AM Algernon Huxley, MD [facility] None   Primary Care Physician: [name], MD Location: [facility] Note by: Kathlen Brunswick. Dossie Arbour, M.D, DABA, DABAPM, DABPM, DABIPP, FIPP  Date: 04/22/2016; Time: 8:49 AM  Pain Score Disclaimer: We use the NRS-11 scale. This is a self-reported, subjective measurement of pain severity with only modest accuracy. It is used primarily to identify changes within a particular patient. It must be understood that outpatient pain scales are significantly less accurate that those used for research, where they can be applied under ideal controlled circumstances with minimal exposure to variables. In reality, the score is likely to be a combination of pain intensity and pain affect, where pain affect describes the degree of emotional arousal or changes in action readiness caused by the sensory experience of pain. Factors such as social and work situation, setting, emotional state, anxiety levels, expectation, and prior pain  experience may influence pain perception and show large inter-individual differences that may also be affected by time variables.  Patient instructions provided during this appointment: Patient Instructions   Pain Management Discharge Instructions  General Discharge Instructions :  If you need to reach your doctor call: Monday-Friday 8:00 am - 4:00 pm at [phone] or toll free [phone].  After clinic hours [phone] to have operator reach doctor.  Bring all of your medication bottles to all your appointments in the pain clinic.  To cancel or reschedule your appointment with Pain Management please remember to call 24 hours in advance to avoid a fee.  Refer to the educational materials which you have been given on: General Risks, I had my Procedure. Discharge Instructions, Post Sedation.  Post Procedure Instructions:  The drugs you were given will stay in your system until tomorrow, so for the next 24 hours you should not drive, make any legal decisions or drink any alcoholic beverages.  You may eat anything you prefer, but it is better to start with liquids then soups and crackers, and gradually work up to solid foods.  Please notify your doctor immediately if you have any unusual bleeding, trouble breathing or pain that is not related to your normal pain.  Depending on the type of procedure that was done, some parts of your body may feel week and/or numb.  This usually clears up by tonight or the next day.  Walk with the use of an assistive device or accompanied by an adult for the 24 hours.  You may use ice on the affected area for the first 24 hours.  Put ice in a Ziploc bag and cover with a towel and place against area 15 minutes on 15 minutes off.  You may switch to heat after 24 hours.  Gabapentin Titration  Medication used: Gabapentin (Generic Name) or Neurontin (Brand Name) 100 mg tablets/capsules  Reasons to stop increasing the dose:  Reason 1: You get good  relief of symptoms, in which case there is no need to increase the daily dose any further.    Reason 2: You develop some side effects, such as sleeping all of the time, difficulty concentrating, or becoming disoriented, in which case you need to go down on the dose, to the prior level, where you were not experiencing any side effects. Stay on that dose longer, to allow more time for your body to get use it, before attempting to increase it again.   Steps to increase medication: Step 1: Start by taking 1 (one) tablet at bedtime x 7 (seven) days.  Step 2: Increase dose to 2 (two) tablets at bedtime. Stay on this dose x 7 (seven) days.  Step 3: Next increase it to 3 (three) tablets at bedtime. Stay on this dose  x another 7 (seven) days.  Step 4: Next, add 1 (one) tablet at noon with lunch. Continue this dose x another 7 (seven) days.  Step 5: Add 1 (one) tablet in the afternoon with dinner. Stay on this dose x another 7 (seven) days.  Step 6: At this point you should be taking the medicine 4 (four) times a day. This daily regimen of taking the medicine 4 (four) times a day, will be maintained from now on. You should not take any doses any sooner than every 6 (six) hours.  Step 7: After 7 (seven) days of taking 3 (three) tablet at bedtime, 1 (one) tablet at noon, 1 (one) tablet in the afternoon, and 1 (one) tablet in the morning, begin taking 2 (two) tablets at noon with lunch. Stay on this dose x another 7 (seven) days.   Step 8: After 7 (seven) days of taking 3 (three) tablet at bedtime, 2 (two) tablets at noon, 1 (one) tablet in the afternoon, and 1 (one) tablet in the morning, begin taking 2 (two) tablets in the afternoon with dinner. Stay on this dose x another 7 (seven) days.   Step 9: After 7 (seven) days of taking 3 (three) tablet at bedtime, 2 (two) tablets at noon, 2 (two) tablets in the afternoon, and 1 (one) tablet in the morning, begin taking 2 (two) tablets in the morning with  breakfast. Stay on this dose x another 7 (seven) days. At this point you should be taking the medicine 4 (four) times a day, or about every 6 (six) hours. This daily regimen of taking the medicine 4 (four) times a day, will be maintained from now on. You should not take any doses any sooner than every 6 (six) hours.  Step 10: After 7 (seven) days of taking 3 (three) tablet at bedtime, 2 (two) tablets at noon, 2 (two) tablets in the afternoon, and 2 (two) tablets in the morning, begin taking 3 (three) tablets at noon with lunch. Stay on this dose x another 7 (seven) days.   Step 11: After 7 (seven) days of taking 3 (three) tablet at bedtime, 3 (three) tablets at noon, 2 (two) tablets in the afternoon, and 2 (two) tablets in the morning, begin taking 3 (three) tablets in the afternoon with dinner. Stay on this dose x another 7 (seven) days.   Step 12: After 7 (seven) days of taking 3 (three) tablet at bedtime, 3 (three) tablets at noon, 3 (three) tablets in the afternoon, and 2 (two) tablet in the morning, begin taking 3 (three) tablets in the morning with breakfast. Stay on this dose x another 7 (seven) days. At this point you should be taking the medicine 4 (four) times a day, or about every 6 (six) hours. This daily regimen of taking the medicine 4 (four) times a day, will be maintained from now on.   Endpoint: Once you have reached the maximum dose you can tolerate without side-effects, contact your physician so as to evaluate the results of the regimen.   Questions: Feel free to contact us for any questions or problems at [phone]

## 2016-04-22 NOTE — Progress Notes (Signed)
Nursing Pain Medication Assessment:  Safety precautions to be maintained throughout the outpatient stay will include: orient to surroundings, keep bed in low position, maintain call bell within reach at all times, provide assistance with transfer out of bed and ambulation.  Medication Inspection Compliance: Pill count conducted under aseptic conditions, in front of the patient. Neither the pills nor the bottle was removed from the patient's sight at any time. Once count was completed pills were immediately returned to the patient in their original bottle. Pill Count: 96/165 of 165 pills remain Bottle Appearance: Standard pharmacy container. Clearly labeled. Medication: See above Filled Date: 03 / 10 / 2018

## 2016-04-22 NOTE — Telephone Encounter (Deleted)
Pt wants nurses to know he is doing fine after his procedure

## 2016-04-23 ENCOUNTER — Telehealth: Payer: Self-pay | Admitting: Family Medicine

## 2016-04-23 DIAGNOSIS — Z1211 Encounter for screening for malignant neoplasm of colon: Secondary | ICD-10-CM

## 2016-04-23 NOTE — Telephone Encounter (Signed)
His last colonoscopy (in [place]) was June 2008. He will be due for a colonoscopy or Cologuard before the end of the year. If he likes we can refer him to GI for colonoscopy or send order for Cologuard screening.  I would recommend Cologuard.

## 2016-04-23 NOTE — Telephone Encounter (Signed)
Pt received a call stating he was due for colonoscopy.  He wants to know if he is up to date or if he needs to have one done.

## 2016-04-23 NOTE — Telephone Encounter (Signed)
Please advise 

## 2016-04-23 NOTE — Telephone Encounter (Signed)
LMTCB on both numbers-aa

## 2016-04-27 NOTE — Telephone Encounter (Signed)
Pt returned call ° °teri °

## 2016-04-28 NOTE — Telephone Encounter (Signed)
Patient advised and would like to do the cologuard. Would you like me to place the order or do you prefer to do this? ED

## 2016-05-14 ENCOUNTER — Encounter: Payer: Self-pay | Admitting: Family Medicine

## 2016-05-14 ENCOUNTER — Ambulatory Visit (INDEPENDENT_AMBULATORY_CARE_PROVIDER_SITE_OTHER): Payer: PPO | Admitting: Family Medicine

## 2016-05-14 VITALS — BP 120/60 | HR 92 | Temp 98.2°F | Resp 16 | Ht 73.0 in | Wt 184.0 lb

## 2016-05-14 DIAGNOSIS — M1288 Other specific arthropathies, not elsewhere classified, other specified site: Secondary | ICD-10-CM

## 2016-05-14 DIAGNOSIS — Z981 Arthrodesis status: Secondary | ICD-10-CM

## 2016-05-14 DIAGNOSIS — M47816 Spondylosis without myelopathy or radiculopathy, lumbar region: Secondary | ICD-10-CM | POA: Diagnosis not present

## 2016-05-14 NOTE — Progress Notes (Signed)
Patient: Justin Harrington Male    DOB: 09-20-1945   71 y.o.   MRN: [id number] Visit Date: 05/14/2016  Today's Provider: [name], MD   Chief Complaint  Patient presents with  . Back Pain   Subjective:    Back Pain  This is a chronic problem. The problem has been gradually worsening since onset. The pain is moderate (can be severe). The pain is the same all the time. The symptoms are aggravated by standing, bending and position. Stiffness is present all day. Pertinent negatives include no fever, headaches, numbness or weakness.   Patient reports that his pain is not managed with Oxycodone 10mg . Pain medications are currently being managed by Dr. Wynona Canes.  Patient is also requesting to be referred to Dr Maia Petties at [place] [facility] to help with pain management.     No Known Allergies  Past Medical History:  Diagnosis Date  . Aortic aneurysm ([place])   . CAD (coronary artery disease)   . DDD (degenerative disc disease), lumbar   . Hyperlipidemia   . Personal history of tobacco use, presenting hazards to health 06/12/2015  . Personal history of tobacco use, presenting hazards to health 06/12/2015  . Prostate cancer [facility])    Patient Active Problem List   Diagnosis Date Noted  . Neuropathic pain 03/30/2016  . Urinary hesitancy 01/01/2016  . H/O prostatectomy 01/01/2016  . Bilateral carotid artery stenosis 12/17/2015  . AAA (abdominal aortic aneurysm) without rupture ([place]) 12/17/2015  . Hypokalemia 12/10/2015  . Hypomagnesemia 12/10/2015  . Chronic sacroiliac joint pain (Bilateral) (R>L) 12/10/2015  . Chronic hip pain (Bilateral) (R>L) 12/10/2015  . Chronic pain syndrome 12/10/2015  . Long term current use of opiate analgesic 10/16/2015  . Long term prescription opiate use 10/16/2015  . Opiate use (180 MME/Day) 10/16/2015  . Encounter for therapeutic drug level monitoring 10/16/2015  . Chronic low back pain (Location of Primary Source of Pain) (Bilateral) (R>L)  10/16/2015  . 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 History of lumbar fusion (L4-S1 posterior hardware) 10/16/2015  . Lumbar spondylosis 10/16/2015  . Lumbar facet syndrome (Bilateral) (R>L) 10/16/2015  . Opioid-induced constipation (OIC) 06/05/2015  . Cataract of left eye 09/03/2014  . Elevated PSA 09/03/2014  . History of DVT (deep vein thrombosis) 09/03/2014  . History of prostate cancer 09/03/2014  . Hypercholesteremia 07/24/2014  . Hypertension 07/24/2014  . Carotid arterial disease ([place]) 09/02/2010  . Diverticulosis of colon without hemorrhage 07/11/2006  . Insomnia 06/29/2006  . Smoking greater than 30 pack years 02/01/2006  . Arthrodesis status 02/02/1996   Past Surgical History:  Procedure Laterality Date  . BACK SURGERY  1998   Lumbar spine fusion  . carotid doppler ultrasound  10/05/2011   75-90% RCA occlusion, 50% on left  . CAROTID ENDARTERECTOMY Right 12/02/2011   Dr. Lucky Cowboy  . CHOLECYSTECTOMY  10/02/2010   Laparoscopic, Dr. Pat Patrick, [facility]  . 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 Cholelithiasis and acute cholecystiis. Mild enlargement of left adrenal gland  . PROSTATE SURGERY  10/19/2012   prostatectomy. [facility]; Laser assisted, Done by [name] for prostate cancer     Current Outpatient Prescriptions:  .  aspirin EC 81 MG tablet, Take 1 tablet by mouth daily., Disp: , Rfl:  .  atorvastatin (LIPITOR) 80 MG tablet, Take 1 tablet (80 mg total) by mouth daily., Disp: 90 tablet, Rfl: 4 .  gabapentin (  NEURONTIN) 100 MG capsule, Take 1-3 capsules (100-300 mg total) by mouth 4 (four) times daily. Follow written titration schedule., Disp: 360 capsule, Rfl: 1 .  hydrochlorothiazide (HYDRODIURIL) 25 MG tablet, Take 1 tablet (25 mg total) by mouth daily., Disp: 90 tablet, Rfl: 4 .  lubiprostone (AMITIZA) 24 MCG capsule, Take 1 capsule (24 mcg total) by mouth 2 (two) times daily with a  meal. Swallow the medication whole. Do not break or chew the medication., Disp: 60 capsule, Rfl: PRN .  naloxone (NARCAN) 2 MG/2ML injection, , Disp: , Rfl: 1 .  Oxycodone HCl 10 MG TABS, Take 1 tablet (10 mg total) by mouth 5 (five) times daily., Disp: 210 tablet, Rfl: 0 .  traZODone (DESYREL) 150 MG tablet, Take 1 tablet (150 mg total) by mouth at bedtime., Disp: 90 tablet, Rfl: 3 .  Wheat Dextrin (BENEFIBER) POWD, Stir 2 tsp. TID into 4-8 oz of any non-carbonated beverage or soft food (hot or cold), Disp: 500 g, Rfl: PRN .  oxyCODONE (OXY IR/ROXICODONE) 5 MG immediate release tablet, Take 1 tablet (5 mg total) by mouth 5 (five) times daily. Max: 5/day (Patient not taking: Reported on 05/14/2016), Disp: 35 tablet, Rfl: 0 .  [START ON 05/17/2016] oxyCODONE (OXY IR/ROXICODONE) 5 MG immediate release tablet, Take 1 tablet (5 mg total) by mouth 4 (four) times daily. Max: 4/day (Patient not taking: Reported on 05/14/2016), Disp: 28 tablet, Rfl: 0 .  [START ON 05/24/2016] oxyCODONE (OXY IR/ROXICODONE) 5 MG immediate release tablet, Take 1 tablet (5 mg total) by mouth 3 (three) times daily. Max: 3/day (Patient not taking: Reported on 05/14/2016), Disp: 21 tablet, Rfl: 0 .  [START ON 05/31/2016] oxyCODONE (OXY IR/ROXICODONE) 5 MG immediate release tablet, Take 1 tablet (5 mg total) by mouth 2 (two) times daily. Max: 2/day (Patient not taking: Reported on 05/14/2016), Disp: 14 tablet, Rfl: 0 .  [START ON 06/07/2016] oxyCODONE (OXY IR/ROXICODONE) 5 MG immediate release tablet, Take 1 tablet (5 mg total) by mouth daily. Max: 1/day (Patient not taking: Reported on 05/14/2016), Disp: 7 tablet, Rfl: 0  Review of Systems  Constitutional: Positive for fatigue. Negative for activity change, appetite change, chills, diaphoresis, fever and unexpected weight change.  Musculoskeletal: Positive for arthralgias and back pain.  Neurological: Negative for dizziness, facial asymmetry, weakness, light-headedness, numbness and headaches.     Social History  Substance Use Topics  . Smoking status: Current Every Day Smoker    Packs/day: 0.75    Years: 50.00    Types: Cigarettes  . Smokeless tobacco: Never Used  . Alcohol use No   Objective:   BP 120/60 (BP Location: Left Arm, Patient Position: Sitting, Cuff Size: Normal)   Pulse 92   Temp 98.2 F (36.8 C)   Resp 16   Ht 6\' 1"  (1.854 m)   Wt 184 lb (83.5 kg)   SpO2 99%   BMI 24.28 kg/m  Vitals:   05/14/16 0837  BP: 120/60  Pulse: 92  Resp: 16  Temp: 98.2 F (36.8 C)  SpO2: 99%  Weight: 184 lb (83.5 kg)  Height: 6\' 1"  (1.854 m)     Physical Exam   General Appearance:    Alert, cooperative, no distress  Eyes:    PERRL, conjunctiva/corneas clear, EOM's intact       Lungs:     Clear to auscultation bilaterally, respirations unlabored  Heart:    Regular rate and rhythm  Neurologic:   Awake, alert, oriented x 3. No apparent focal neurological  defect.           Assessment & Plan:     1. Lumbar spondylosis  - Ambulatory referral to Physical Medicine Rehab  2. Lumbar facet syndrome (Bilateral) (R>L)  - Ambulatory referral to Physical Medicine Rehab  3. History of lumbar fusion (L4-S1 posterior hardware)  - Ambulatory referral to Physical Medicine Rehab. Dr. Zonia Kief.        [name], MD  [facility]

## 2016-05-31 ENCOUNTER — Telehealth: Payer: Self-pay | Admitting: Family Medicine

## 2016-05-31 NOTE — Telephone Encounter (Signed)
Pt contacted office for refill request on the following medications:  Oxycodone HCl 10 MG TABS.  IR#[phone]/TK  Pt states he spoke with Dr Caryn Section and Dr Caryn Section told pt he would refill this with 30mg /MW

## 2016-05-31 NOTE — Telephone Encounter (Signed)
Please advise refill? 

## 2016-06-01 ENCOUNTER — Telehealth: Payer: Self-pay | Admitting: *Deleted

## 2016-06-01 DIAGNOSIS — Z87891 Personal history of nicotine dependence: Secondary | ICD-10-CM

## 2016-06-01 NOTE — Telephone Encounter (Signed)
Notified patient that annual lung cancer screening low dose CT scan is due. Confirmed that patient is within the age range of 55-77, and asymptomatic, (no signs or symptoms of lung cancer). Patient denies illness that would prevent curative treatment for lung cancer if found. The patient is a current smoker, with a 38 pack year history. The shared decision making visit was done 06/13/15. Patient is agreeable for CT scan being scheduled.

## 2016-06-02 NOTE — Progress Notes (Addendum)
Patient's Name: Justin Harrington  MRN: [id number]  Referring Provider: [name], MD  DOB: March 08, 1945  PCP: [name], MD  DOS: 06/03/2016  Note by: Kathlen Brunswick. Dossie Arbour, MD  Service setting: Ambulatory outpatient  Specialty: Interventional Pain Management  Location: [facility] (AMB) Pain Management Facility    Patient type: Established   Primary Reason(s) for Visit: Encounter for prescription drug management (Level of risk: moderate) CC: Back Pain (lower) and Hip Pain (right)  HPI  Mr. Hoard is a 71 y.o. year old, male patient, who comes today for a medication management evaluation. He has Carotid arterial disease ([place]); Cataract of left eye; Diverticulosis of colon without hemorrhage; Elevated PSA; History of DVT (deep vein thrombosis); Insomnia; History of prostate cancer; Arthrodesis status; Smoking greater than 30 pack years; Hypercholesteremia; Hypertension; Opioid-induced constipation (OIC); Long term current use of opiate analgesic; Long term prescription opiate use; Opiate use (90 MME/Day); Encounter for therapeutic drug level monitoring; Chronic low back pain (Location of Primary Source of Pain) (Bilateral) (R>L); Failed back surgical syndrome; History of lumbar fusion (L4-S1 posterior hardware); Lumbar spondylosis; Lumbar facet syndrome (Bilateral) (R>L); Hypokalemia; Hypomagnesemia; Chronic sacroiliac joint pain (Bilateral) (R>L); Chronic hip pain (Bilateral) (R>L); Chronic pain syndrome; Bilateral carotid artery stenosis; AAA (abdominal aortic aneurysm) without rupture ([place]); Urinary hesitancy; H/O prostatectomy; and Neuropathic pain on his problem list. His primarily concern today is the Back Pain (lower) and Hip Pain (right)  Pain Assessment: Self-Reported Pain Score: 6 /10             Reported level is compatible with observation.       Pain Type: Chronic pain Pain Location: Back Pain Orientation: Lower Pain Descriptors / Indicators: Aching (steady) Pain Frequency:  Constant  Mr. Duffey was last scheduled for an appointment on 04/22/2016 for medication management. During today's appointment we reviewed Mr. Chaudoin's chronic pain status, as well as his outpatient medication regimen. The patient will need to return to clinic for evaluation after completing the drug holiday on 09/06/2016.  The patient  reports that he does not use drugs. His body mass index is 22.47 kg/m.  Further details on both, my assessment(s), as well as the proposed treatment plan, please see below.  Controlled Substance Pharmacotherapy Assessment REMS (Risk Evaluation and Mitigation Strategy)  Original Analgesic, before taper:Oxycodone IR 30 mg every 6 hours (120 mg/dayof oxycodone) MME/day:118m/day  Analgesic: Oxycodone IR 10 mg 5 times a day (50 mg per day of oxycodone) + Oxycodone IR 5 mg twice a day (10 mg/day) MME/day: 90 mg/day.  DLandis Martins RN  06/03/2016  8:29 AM  Sign at close encounter Nursing Pain Medication Assessment:  Safety precautions to be maintained throughout the outpatient stay will include: orient to surroundings, keep bed in low position, maintain call bell within reach at all times, provide assistance with transfer out of bed and ambulation.  Medication Inspection Compliance: Pill count conducted under aseptic conditions, in front of the patient. Neither the pills nor the bottle was removed from the patient's sight at any time. Once count was completed pills were immediately returned to the patient in their original bottle.  Medication: Oxycodone IR Pill/Patch Count: 56 of 210 pills remain Pill/Patch Appearance: Markings consistent with prescribed medication Bottle Appearance: Standard pharmacy container. Clearly labeled. Filled Date:04/09/ 2018 Last Medication intake:  Today   Pharmacokinetics: Liberation and absorption (onset of action): WNL Distribution (time to peak effect): WNL Metabolism and excretion (duration of action): WNL  Pharmacodynamics: Desired effects: Analgesia: Mr. Gallardo reports >50% benefit. Functional ability: Patient reports that medication allows him to accomplish basic ADLs Clinically meaningful improvement in function (CMIF): Sustained CMIF goals met Perceived effectiveness: Described as relatively effective, allowing for increase in activities of daily living (ADL) Undesirable effects: Side-effects or Adverse reactions: None reported Monitoring: [place] PMP: Online review of the past 56-monthperiod conducted. Compliant with practice rules and regulations List of all UDS test(s) done:  Lab Results  Component Value Date   TOXASSSELUR FINAL 03/30/2016   SUMMARY FINAL 10/16/2015   Last UDS on record: ToxAssure Select 13  Date Value Ref Range Status  03/30/2016 FINAL  Final    Comment:    ==================================================================== TOXASSURE SELECT 13 (MW) ==================================================================== Test                             Result       Flag       Units Drug Present and Declared for Prescription Verification   Oxycodone                      >2632        EXPECTED   ng/mg creat   Oxymorphone                    >2632        EXPECTED   ng/mg creat   Noroxycodone                   >2632        EXPECTED   ng/mg creat   Noroxymorphone                 >2632        EXPECTED   ng/mg creat    Sources of oxycodone are scheduled prescription medications.    Oxymorphone, noroxycodone, and noroxymorphone are expected    metabolites of oxycodone. Oxymorphone is also available as a    scheduled prescription medication. ==================================================================== Test                      Result    Flag   Units      Ref Range   Creatinine              380              mg/dL      >=20 ==================================================================== Declared Medications:  The flagging and interpretation on this report are  based on the  following declared medications.  Unexpected results may arise from  inaccuracies in the declared medications.  **Note: The testing scope of this panel includes these medications:  Oxycodone  **Note: The testing scope of this panel does not include following  reported medications:  Aspirin  Atorvastatin  Hydrochlorothiazide  Lubiprostone  Naloxone  Supplement  Trazodone ==================================================================== For clinical consultation, please call [phone] ====================================================================    UDS interpretation: Compliant          Medication Assessment Form: Reviewed. Patient indicates being compliant with therapy Treatment compliance: Compliant Risk Assessment Profile: Aberrant behavior: See prior evaluations. None observed or detected today Comorbid factors increasing risk of overdose: See prior notes. No additional risks detected today Risk of substance use disorder (SUD): Low Opioid Risk Tool (ORT) Total Score:    Interpretation Table:  Score <3 = Low Risk for SUD  Score between 4-7 = Moderate Risk for  SUD  Score >8 = High Risk for Opioid Abuse   Risk Mitigation Strategies:  Patient Counseling: Covered Patient-Prescriber Agreement (PPA): Present and active  Notification to other healthcare providers: Done  Pharmacologic Plan: No change in therapy, at this time  Laboratory Chemistry  Inflammation Markers Lab Results  Component Value Date   CRP 2.7 (H) 10/16/2015   ESRSEDRATE 1 10/16/2015   (CRP: Acute Phase) (ESR: Chronic Phase) Renal Function Markers Lab Results  Component Value Date   BUN 19 03/15/2016   CREATININE 1.21 03/15/2016   GFRAA 70 03/15/2016   GFRNONAA 61 03/15/2016   Hepatic Function Markers Lab Results  Component Value Date   AST 16 03/15/2016   ALT 18 03/15/2016   ALBUMIN 4.5 03/15/2016   ALKPHOS 64 03/15/2016   Electrolytes Lab Results  Component  Value Date   NA 141 03/15/2016   K 4.8 03/15/2016   CL 96 03/15/2016   CALCIUM 9.4 03/15/2016   MG 1.9 03/15/2016   Neuropathy Markers Lab Results  Component Value Date   VITAMINB12 206 10/16/2015   Bone Pathology Markers Lab Results  Component Value Date   ALKPHOS 64 03/15/2016   25OHVITD1 45 10/16/2015   25OHVITD2 <1.0 10/16/2015   25OHVITD3 44 10/16/2015   CALCIUM 9.4 03/15/2016   Coagulation Parameters Lab Results  Component Value Date   INR 1.0 12/03/2011   LABPROT 13.2 12/03/2011   APTT 28.5 12/03/2011   PLT 173 12/03/2011   Cardiovascular Markers Lab Results  Component Value Date   HGB 14.5 12/03/2011   HCT 42.7 12/03/2011   Note: Lab results reviewed.  Recent Diagnostic Imaging Review  Dg Si Joints  Result Date: 03/30/2016 CLINICAL DATA:  71 year old male post lumbar fusion 1998 with lower back pain extending into right hip since surgery. Post prostatectomy for prostate cancer. Initial encounter. EXAM: BILATERAL SACROILIAC JOINTS - 3+ VIEW COMPARISON:  08/03/2012 CT. FINDINGS: Post fusion lower lumbar spine/upper sacrum. Bone donor site right iliac wing. Very mild right sacroiliac joint degenerative changes. Vascular calcifications. IMPRESSION: Post fusion lower lumbar spine/upper sacrum. Very mild right sacroiliac joint degenerative changes. Electronically Signed   By: Genia Del M.D.   On: 03/30/2016 11:24   Dg Hip Unilat W Or W/o Pelvis 2-3 Views Left  Result Date: 03/30/2016 CLINICAL DATA:  71 year old male post lumbar fusion 1998 with lower back pain extending into right hip since surgery. Post prostatectomy for prostate cancer. Initial encounter. EXAM: DG HIP (WITH OR WITHOUT PELVIS) 2-3V LEFT COMPARISON:  08/03/2012 CT. FINDINGS: No fracture or dislocation. No plain film evidence of left femoral head avascular necrosis. Preservation joint space. Lucency left ischium unchanged. Bony overgrowth ischium bilaterally unchanged. Postsurgical changes lower  lumbar spine. No sclerotic focus to suggest metastatic disease. Abdominal aortic aneurysm incompletely assessed on present exam measuring up to 3.7 cm. IMPRESSION: No significant left hip joint degenerative changes or evidence of avascular necrosis. Abdominal aortic aneurysm incompletely assessed on present exam measuring up to 3.7 cm. Electronically Signed   By: Genia Del M.D.   On: 03/30/2016 11:19   Dg Hip Unilat W Or W/o Pelvis 2-3 Views Right  Result Date: 03/30/2016 CLINICAL DATA:  71 year old male post lumbar fusion 1998 with lower back pain extending into right hip since surgery. Post prostatectomy for prostate cancer. Initial encounter. EXAM: DG HIP (WITH OR WITHOUT PELVIS) 2-3V RIGHT COMPARISON:  08/03/2012 CT. FINDINGS: Minimal right hip joint degenerative changes. No plain film evidence of right femoral head avascular necrosis. Postsurgical changes lumbar spine. 3.7  cm abdominal aortic aneurysm incompletely assessed. No sclerotic foci to suggest osseous metastatic disease. IMPRESSION: Minimal right hip joint degenerative changes. 3.7 cm abdominal aortic aneurysm incompletely assessed. Electronically Signed   By: Genia Del M.D.   On: 03/30/2016 11:21   Note: Imaging results reviewed.          Meds  The patient has a current medication list which includes the following prescription(s): aspirin ec, atorvastatin, hydrochlorothiazide, naloxone, oxycodone, oxycodone, oxycodone, oxycodone, oxycodone, oxycodone, oxycodone, oxycodone, oxycodone hcl, trazodone, and benefiber.  Current Outpatient Prescriptions on File Prior to Visit  Medication Sig   aspirin EC 81 MG tablet Take 1 tablet by mouth daily.   atorvastatin (LIPITOR) 80 MG tablet Take 1 tablet (80 mg total) by mouth daily.   hydrochlorothiazide (HYDRODIURIL) 25 MG tablet Take 1 tablet (25 mg total) by mouth daily.   naloxone (NARCAN) 2 MG/2ML injection    Oxycodone HCl 10 MG TABS Take 1 tablet (10 mg total) by mouth 5 (five)  times daily.   traZODone (DESYREL) 150 MG tablet Take 1 tablet (150 mg total) by mouth at bedtime.   Wheat Dextrin (BENEFIBER) POWD Stir 2 tsp. TID into 4-8 oz of any non-carbonated beverage or soft food (hot or cold)   No current facility-administered medications on file prior to visit.    ROS  Constitutional: Denies any fever or chills Gastrointestinal: No reported hemesis, hematochezia, vomiting, or acute GI distress Musculoskeletal: Denies any acute onset joint swelling, redness, loss of ROM, or weakness Neurological: No reported episodes of acute onset apraxia, aphasia, dysarthria, agnosia, amnesia, paralysis, loss of coordination, or loss of consciousness  Allergies  Mr. Holness has No Known Allergies.  [place]  Drug: Mr. Groeneveld  reports that he does not use drugs. Alcohol:  reports that he does not drink alcohol. Tobacco:  reports that he has been smoking Cigarettes.  He has a 37.50 pack-year smoking history. He has never used smokeless tobacco. Medical:  has a past medical history of Aortic aneurysm ([place]); CAD (coronary artery disease); DDD (degenerative disc disease), lumbar; Hyperlipidemia; Personal history of tobacco use, presenting hazards to health (06/12/2015); Personal history of tobacco use, presenting hazards to health (06/12/2015); and Prostate cancer ([place]). Family: family history includes Cerebral palsy in his son; Congestive Heart Failure in his father; Heart attack in his mother.  Past Surgical History:  Procedure Laterality Date   BACK SURGERY  1998   Lumbar spine fusion   carotid doppler ultrasound  10/05/2011   75-90% RCA occlusion, 50% on left   CAROTID ENDARTERECTOMY Right 12/02/2011   Dr. Lucky Cowboy   CHOLECYSTECTOMY  10/02/2010   Laparoscopic, Dr. Pat Patrick, [place]  10/02/2010   [place]; Infrarenal suprailiac abdominal aortic aneurysm maximum dimention of nearly 4cm. Cholelithiasis and acute cholecystiis. Mild enlargement of left adrenal gland   PROSTATE  SURGERY  10/19/2012   prostatectomy. [facility]; Laser assisted, Done by [name] for prostate cancer   Constitutional Exam  General appearance: Well nourished, well developed, and well hydrated. In no apparent acute distress Vitals:   06/03/16 0822  BP: 137/70  Pulse: 92  Resp: 16  Temp: 98.1 F (36.7 C)  TempSrc: Oral  SpO2: 96%  Weight: 175 lb (79.4 kg)  Height: '6\' 2"'  (1.88 m)   BMI Assessment: Estimated body mass index is 22.47 kg/m as calculated from the following:   Height as of this encounter: '6\' 2"'  (1.88 m).   Weight as of this encounter: 175 lb (79.4  kg).  BMI interpretation table: BMI level Category Range association with higher incidence of chronic pain  <18 kg/m2 Underweight   18.5-24.9 kg/m2 Ideal body weight   25-29.9 kg/m2 Overweight Increased incidence by 20%  30-34.9 kg/m2 Obese (Class I) Increased incidence by 68%  35-39.9 kg/m2 Severe obesity (Class II) Increased incidence by 136%  >40 kg/m2 Extreme obesity (Class [place]) Increased incidence by 254%   BMI Readings from Last 4 Encounters:  06/03/16 22.47 kg/m  05/14/16 24.28 kg/m  04/22/16 22.47 kg/m  03/30/16 22.47 kg/m   Wt Readings from Last 4 Encounters:  06/03/16 175 lb (79.4 kg)  05/14/16 184 lb (83.5 kg)  04/22/16 175 lb (79.4 kg)  03/30/16 175 lb (79.4 kg)  Psych/Mental status: Alert, oriented x 3 (person, place, & time)       Eyes: PERLA Respiratory: No evidence of acute respiratory distress  Cervical Spine Exam  Inspection: No masses, redness, or swelling Alignment: Symmetrical Functional ROM: Unrestricted ROM      Stability: No instability detected Muscle strength & Tone: Functionally intact Sensory: Unimpaired Palpation: No palpable anomalies              Upper Extremity (UE) Exam    Side: Right upper extremity  Side: Left upper extremity  Inspection: No masses, redness, swelling, or asymmetry. No contractures  Inspection: No masses, redness, swelling, or asymmetry.  No contractures  Functional ROM: Unrestricted ROM          Functional ROM: Unrestricted ROM          Muscle strength & Tone: Functionally intact  Muscle strength & Tone: Functionally intact  Sensory: Unimpaired  Sensory: Unimpaired  Palpation: No palpable anomalies              Palpation: No palpable anomalies              Specialized Test(s): Deferred         Specialized Test(s): Deferred          Thoracic Spine Exam  Inspection: No masses, redness, or swelling Alignment: Symmetrical Functional ROM: Unrestricted ROM Stability: No instability detected Sensory: Unimpaired Muscle strength & Tone: No palpable anomalies  Lumbar Spine Exam  Inspection: No masses, redness, or swelling Alignment: Symmetrical Functional ROM: Unrestricted ROM      Stability: No instability detected Muscle strength & Tone: Functionally intact Sensory: Unimpaired Palpation: No palpable anomalies       Provocative Tests: Lumbar Hyperextension and rotation test: evaluation deferred today       Patrick's Maneuver: evaluation deferred today                    Gait & Posture Assessment  Ambulation: Unassisted Gait: Relatively normal for age and body habitus Posture: WNL   Lower Extremity Exam    Side: Right lower extremity  Side: Left lower extremity  Inspection: No masses, redness, swelling, or asymmetry. No contractures  Inspection: No masses, redness, swelling, or asymmetry. No contractures  Functional ROM: Unrestricted ROM          Functional ROM: Unrestricted ROM          Muscle strength & Tone: Functionally intact  Muscle strength & Tone: Functionally intact  Sensory: Unimpaired  Sensory: Unimpaired  Palpation: No palpable anomalies  Palpation: No palpable anomalies   Assessment  Primary Diagnosis & Pertinent Problem List: The primary encounter diagnosis was Chronic low back pain (Location of Primary Source of Pain) (Bilateral) (R>L). Diagnoses of Lumbar facet syndrome (Bilateral) (R>L), Chronic  sacroiliac joint pain (Bilateral) (R>L), Failed back surgical syndrome, Chronic pain syndrome, Long term prescription opiate use, Neurogenic pain, Neuropathic pain, and Opiate use (90 MME/Day) were also pertinent to this visit.  Status Diagnosis  Controlled Controlled Controlled 1. Chronic low back pain (Location of Primary Source of Pain) (Bilateral) (R>L)   2. Lumbar facet syndrome (Bilateral) (R>L)   3. Chronic sacroiliac joint pain (Bilateral) (R>L)   4. Failed back surgical syndrome   5. Chronic pain syndrome   6. Long term prescription opiate use   7. Neurogenic pain   8. Neuropathic pain   9. Opiate use (90 MME/Day)      Plan of Care  Pharmacotherapy (Medications Ordered): Meds ordered this encounter  Medications   oxyCODONE (OXY IR/ROXICODONE) 5 MG immediate release tablet    Sig: Take 1 tablet (5 mg total) by mouth 5 (five) times daily.    Dispense:  175 tablet    Refill:  0    This prescription is part of a downward opioid taper. Fill instructions must be followed exactly as written to avoid withdrawal. Fill date: 06/21/16 To last until: 07/26/16   oxyCODONE (OXY IR/ROXICODONE) 5 MG immediate release tablet    Sig: Take 1 tablet (5 mg total) by mouth 4 (four) times daily. Max: 4/day    Dispense:  28 tablet    Refill:  0    This prescription is part of a downward opioid taper. Fill instructions must be followed exactly as written to avoid withdrawal. Fill date: 06/21/16 To last until: 06/28/16   oxyCODONE (OXY IR/ROXICODONE) 5 MG immediate release tablet    Sig: Take 1 tablet (5 mg total) by mouth 3 (three) times daily. Max: 3/day    Dispense:  21 tablet    Refill:  0    This prescription is part of a downward opioid taper. Fill instructions must be followed exactly as written to avoid withdrawal. Fill date: 06/28/16 To last until: 07/05/16   oxyCODONE (OXY IR/ROXICODONE) 5 MG immediate release tablet    Sig: Take 1 tablet (5 mg total) by mouth 2 (two) times daily.  Max: 2/day    Dispense:  14 tablet    Refill:  0    This prescription is part of a downward opioid taper. Fill instructions must be followed exactly as written to avoid withdrawal. Fill date: 07/05/16 To last until: 07/12/16   oxyCODONE (OXY IR/ROXICODONE) 5 MG immediate release tablet    Sig: Take 1 tablet (5 mg total) by mouth daily. Max: 1/day    Dispense:  7 tablet    Refill:  0    This prescription is part of a downward opioid taper. Fill instructions must be followed exactly as written to avoid withdrawal. Fill date: 07/12/16 To last until: 07/19/16   oxyCODONE (OXY IR/ROXICODONE) 5 MG immediate release tablet    Sig: Take 1 tablet (5 mg total) by mouth 4 (four) times daily. Max: 4/day    Dispense:  28 tablet    Refill:  0    This prescription is part of a downward opioid taper. Fill instructions must be followed exactly as written to avoid withdrawal. Fill date: 07/26/16 To last until: 08/02/16   oxyCODONE (OXY IR/ROXICODONE) 5 MG immediate release tablet    Sig: Take 1 tablet (5 mg total) by mouth 3 (three) times daily. Max: 3/day    Dispense:  21 tablet    Refill:  0    This prescription is part of a downward opioid taper.  Fill instructions must be followed exactly as written to avoid withdrawal. Fill date: 08/02/16 To last until: 08/09/16   oxyCODONE (OXY IR/ROXICODONE) 5 MG immediate release tablet    Sig: Take 1 tablet (5 mg total) by mouth 2 (two) times daily. Max: 2/day    Dispense:  14 tablet    Refill:  0    This prescription is part of a downward opioid taper. Fill instructions must be followed exactly as written to avoid withdrawal. Fill date: 08/09/16 To last until: 08/16/16   oxyCODONE (OXY IR/ROXICODONE) 5 MG immediate release tablet    Sig: Take 1 tablet (5 mg total) by mouth daily. Max: 1/day    Dispense:  7 tablet    Refill:  0    This prescription is part of a downward opioid taper. Fill instructions must be followed exactly as written to avoid  withdrawal. Fill date: 08/16/16 To last until: 08/23/16   New Prescriptions   OXYCODONE (OXY IR/ROXICODONE) 5 MG IMMEDIATE RELEASE TABLET    Take 1 tablet (5 mg total) by mouth 4 (four) times daily. Max: 4/day   OXYCODONE (OXY IR/ROXICODONE) 5 MG IMMEDIATE RELEASE TABLET    Take 1 tablet (5 mg total) by mouth 3 (three) times daily. Max: 3/day   OXYCODONE (OXY IR/ROXICODONE) 5 MG IMMEDIATE RELEASE TABLET    Take 1 tablet (5 mg total) by mouth 2 (two) times daily. Max: 2/day   OXYCODONE (OXY IR/ROXICODONE) 5 MG IMMEDIATE RELEASE TABLET    Take 1 tablet (5 mg total) by mouth daily. Max: 1/day   OXYCODONE (OXY IR/ROXICODONE) 5 MG IMMEDIATE RELEASE TABLET    Take 1 tablet (5 mg total) by mouth 4 (four) times daily. Max: 4/day   OXYCODONE (OXY IR/ROXICODONE) 5 MG IMMEDIATE RELEASE TABLET    Take 1 tablet (5 mg total) by mouth 3 (three) times daily. Max: 3/day   OXYCODONE (OXY IR/ROXICODONE) 5 MG IMMEDIATE RELEASE TABLET    Take 1 tablet (5 mg total) by mouth 2 (two) times daily. Max: 2/day   OXYCODONE (OXY IR/ROXICODONE) 5 MG IMMEDIATE RELEASE TABLET    Take 1 tablet (5 mg total) by mouth daily. Max: 1/day   Medications administered today: Mr. Vi had no medications administered during this visit. Lab-work, procedure(s), and/or referral(s): No orders of the defined types were placed in this encounter.  Imaging and/or referral(s): None  Interventional therapies: Planned, scheduled, and/or pending:   Not at this time.   Considering:   Diagnostic bilateral Lumbarfacet block +sacroiliac joint block #2 Diagnostic bilateral lumbar facet block Possible bilateral lumbar facet radiofrequency ablation Diagnostic bilateral sacroiliac joint injection  Possible bilateral sacroiliac joint radiofrequency ablation. Diagnostic bilateral intra-articular hip joint injection  Possible bilateral hip joint radiofrequency ablation. Caudal epidural steroid injection + epidurogram.  Possible Racz  procedure.    Palliative PRN treatment(s):   Palliative bilateral Lumbarfacet block +sacroiliac joint block #2 Palliative bilateral lumbar facet block Palliative bilateral sacroiliac joint injection  Diagnostic Caudal epidural steroid injection + epidurogram.    Provider-requested follow-up: Return in 2 months (on 08/16/2016) for Med-Mgmt, by MD, in addition, PRN procedure(s), w/ MD.  Future Appointments Date Time Provider [place]  06/17/2016 2:00 PM [facility]  06/18/2016 8:30 AM [facility] VASC 2 [facility] None  06/18/2016 9:30 AM Algernon Huxley, MD [facility] None  08/19/2016 8:45 AM [name], MD [facility] None  12/17/2016 9:30 AM [facility] VASC 1 [facility] None  12/17/2016 10:30 AM Algernon Huxley, MD [facility] None   Primary Care  Physician: [name], MD Location: [facility] Note by: Kathlen Brunswick Dossie Arbour, M.D, DABA, DABAPM, DABPM, DABIPP, FIPP Date: 06/03/2016; Time: 11:10 AM  Patient instructions provided during this appointment: Patient Instructions   Pain Score  Introduction: The pain score used by this practice is the Verbal Numerical Rating Scale (VNRS-11). This is an 11-point scale. It is for adults and children 10 years or older. There are significant differences in how the pain score is reported, used, and applied. Forget everything you learned in the past and learn this scoring system.  General Information: The scale should reflect your current level of pain. Unless you are specifically asked for the level of your worst pain, or your average pain. If you are asked for one of these two, then it should be understood that it is over the past 24 hours.  Basic Activities of Daily Living (ADL): Personal hygiene, dressing, eating, transferring, and using restroom.  Instructions: Most patients tend to report their level of pain as a combination of two factors, their physical pain and their psychosocial pain. This last one is  also known as suffering and it is reflection of how physical pain affects you socially and psychologically. From now on, report them separately. From this point on, when asked to report your pain level, report only your physical pain. Use the following table for reference.  Pain Clinic Pain Levels (0-5/10)  Pain Level Score Description  No Pain 0   Mild pain 1 Nagging, annoying, but does not interfere with basic activities of daily living (ADL). Patients are able to eat, bathe, get dressed, toileting (being able to get on and off the toilet and perform personal hygiene functions), transfer (move in and out of bed or a chair without assistance), and maintain continence (able to control bladder and bowel functions). Blood pressure and heart rate are unaffected. A normal heart rate for a healthy adult ranges from 60 to 100 bpm (beats per minute).   Mild to moderate pain 2 Noticeable and distracting. Impossible to hide from other people. More frequent flare-ups. Still possible to adapt and function close to normal. It can be very annoying and may have occasional stronger flare-ups. With discipline, patients may get used to it and adapt.   Moderate pain 3 Interferes significantly with activities of daily living (ADL). It becomes difficult to feed, bathe, get dressed, get on and off the toilet or to perform personal hygiene functions. Difficult to get in and out of bed or a chair without assistance. Very distracting. With effort, it can be ignored when deeply involved in activities.   Moderately severe pain 4 Impossible to ignore for more than a few minutes. With effort, patients may still be able to manage work or participate in some social activities. Very difficult to concentrate. Signs of autonomic nervous system discharge are evident: dilated pupils (mydriasis); mild sweating (diaphoresis); sleep interference. Heart rate becomes elevated (>115 bpm). Diastolic blood pressure (lower number) rises above  100 mmHg. Patients find relief in laying down and not moving.   Severe pain 5 Intense and extremely unpleasant. Associated with frowning face and frequent crying. Pain overwhelms the senses.  Ability to do any activity or maintain social relationships becomes significantly limited. Conversation becomes difficult. Pacing back and forth is common, as getting into a comfortable position is nearly impossible. Pain wakes you up from deep sleep. Physical signs will be obvious: pupillary dilation; increased sweating; goosebumps; brisk reflexes; cold, clammy hands and feet; nausea, vomiting or  dry heaves; loss of appetite; significant sleep disturbance with inability to fall asleep or to remain asleep. When persistent, significant weight loss is observed due to the complete loss of appetite and sleep deprivation.  Blood pressure and heart rate becomes significantly elevated. Caution: If elevated blood pressure triggers a pounding headache associated with blurred vision, then the patient should immediately seek attention at an urgent or emergency care unit, as these may be signs of an impending stroke.    Emergency Department Pain Levels (6-10/10)  Emergency Room Pain 6 Severely limiting. Requires emergency care and should not be seen or managed at an outpatient pain management facility. Communication becomes difficult and requires great effort. Assistance to reach the emergency department may be required. Facial flushing and profuse sweating along with potentially dangerous increases in heart rate and blood pressure will be evident.   Distressing pain 7 Self-care is very difficult. Assistance is required to transport, or use restroom. Assistance to reach the emergency department will be required. Tasks requiring coordination, such as bathing and getting dressed become very difficult.   Disabling pain 8 Self-care is no longer possible. At this level, pain is disabling. The individual is unable to do even the most  basic activities such as walking, eating, bathing, dressing, transferring to a bed, or toileting. Fine motor skills are lost. It is difficult to think clearly.   Incapacitating pain 9 Pain becomes incapacitating. Thought processing is no longer possible. Difficult to remember your own name. Control of movement and coordination are lost.   The worst pain imaginable 10 At this level, most patients pass out from pain. When this level is reached, collapse of the autonomic nervous system occurs, leading to a sudden drop in blood pressure and heart rate. This in turn results in a temporary and dramatic drop in blood flow to the brain, leading to a loss of consciousness. Fainting is one of the bodys self defense mechanisms. Passing out puts the brain in a calmed state and causes it to shut down for a while, in order to begin the healing process.    Summary: 1. Refer to this scale when providing [place] with your pain level. 2. Be accurate and careful when reporting your pain level. This will help with your care. 3. Over-reporting your pain level will lead to loss of credibility. 4. Even a level of 1/10 means that there is pain and will be treated at our facility. 5. High, inaccurate reporting will be documented as Symptom Exaggeration, leading to loss of credibility and suspicions of possible secondary gains such as obtaining more narcotics, or wanting to appear disabled, for fraudulent reasons. 6. Only pain levels of 5 or below will be seen at our facility. 7. Pain levels of 6 and above will be sent to the Emergency Department and the appointment cancelled. _____________________________________________________________________________________________  DRUG HOLIDAYS  Definitions Tolerance: defined as the progressively decreased responsiveness to a drug. Occurs when the drug is used repeatedly and the body adapts to the continued presence of the drug. As a result, a larger dose of the drug is needed to  achieve the effect originally obtained by a smaller dose. It is thought to be due to the formation of excess opioid receptors.  Drug Holiday: is when a patient stops taking a medication(s) for a period of time; anywhere from a few days to several weeks.  Withdrawals: refers to the wide range of symptoms that occur after stopping or dramatically reducing opiate drugs after heavy and prolonged use. Withdrawal  symptoms do not occur to patients that use low dose opioids, or those who take the medication sporadically. Contrary to benzodiazepine (example: Valium, Xanax, etc.) or alcohol withdrawals (Delirium Tremens), opioid withdrawals are not lethal. Withdrawals are the physical manifestation of the body getting rid of the excess receptors.  Purpose To eliminate tolerance.  Duration of Holiday 14 consecutive days. (2 weeks)  Expected Symptoms Early symptoms of withdrawal include:  Agitation  Anxiety  Muscle aches  Increased tearing  Insomnia  Runny nose  Sweating  Yawning  Late symptoms of withdrawal include:  Abdominal cramping  Diarrhea  Dilated pupils  Goose bumps  Nausea  Vomiting  Opioid withdrawal reactions are very uncomfortable but are not life-threatening. Symptoms usually start within 12 hours of last opioid dose and within 30 hours of last methadone exposure.  Duration of Symptoms 48 to 72 hours for short acting medications and 2 to 14 days for methadone.  Treatment  Clonidine (Catapres) or tizanidine (Zanaflex) for agitation, sweating, tearing, runny nose.  Promethazine (Phenergan) for nausea, vomiting.  NSAIDs for pain.  Benefits  Improved effectiveness of opioids.  Decreased opioid dose needed to achieve benefits.  Improved pain with lesser dose. _____________________________________________________________________________________________

## 2016-06-03 ENCOUNTER — Encounter: Payer: Self-pay | Admitting: Pain Medicine

## 2016-06-03 ENCOUNTER — Ambulatory Visit: Payer: PPO | Attending: Nurse Practitioner | Admitting: Pain Medicine

## 2016-06-03 VITALS — BP 137/70 | HR 92 | Temp 98.1°F | Resp 16 | Ht 74.0 in | Wt 175.0 lb

## 2016-06-03 DIAGNOSIS — Z7982 Long term (current) use of aspirin: Secondary | ICD-10-CM | POA: Insufficient documentation

## 2016-06-03 DIAGNOSIS — Z8249 Family history of ischemic heart disease and other diseases of the circulatory system: Secondary | ICD-10-CM | POA: Diagnosis not present

## 2016-06-03 DIAGNOSIS — Z820 Family history of epilepsy and other diseases of the nervous system: Secondary | ICD-10-CM | POA: Diagnosis not present

## 2016-06-03 DIAGNOSIS — M792 Neuralgia and neuritis, unspecified: Secondary | ICD-10-CM

## 2016-06-03 DIAGNOSIS — Z981 Arthrodesis status: Secondary | ICD-10-CM | POA: Insufficient documentation

## 2016-06-03 DIAGNOSIS — M25551 Pain in right hip: Secondary | ICD-10-CM | POA: Insufficient documentation

## 2016-06-03 DIAGNOSIS — M533 Sacrococcygeal disorders, not elsewhere classified: Secondary | ICD-10-CM

## 2016-06-03 DIAGNOSIS — M5136 Other intervertebral disc degeneration, lumbar region: Secondary | ICD-10-CM | POA: Diagnosis not present

## 2016-06-03 DIAGNOSIS — M47816 Spondylosis without myelopathy or radiculopathy, lumbar region: Secondary | ICD-10-CM

## 2016-06-03 DIAGNOSIS — I2510 Atherosclerotic heart disease of native coronary artery without angina pectoris: Secondary | ICD-10-CM | POA: Insufficient documentation

## 2016-06-03 DIAGNOSIS — Z8546 Personal history of malignant neoplasm of prostate: Secondary | ICD-10-CM | POA: Insufficient documentation

## 2016-06-03 DIAGNOSIS — Z9049 Acquired absence of other specified parts of digestive tract: Secondary | ICD-10-CM | POA: Insufficient documentation

## 2016-06-03 DIAGNOSIS — F17210 Nicotine dependence, cigarettes, uncomplicated: Secondary | ICD-10-CM | POA: Insufficient documentation

## 2016-06-03 DIAGNOSIS — E7800 Pure hypercholesterolemia, unspecified: Secondary | ICD-10-CM | POA: Insufficient documentation

## 2016-06-03 DIAGNOSIS — I714 Abdominal aortic aneurysm, without rupture: Secondary | ICD-10-CM | POA: Insufficient documentation

## 2016-06-03 DIAGNOSIS — G8929 Other chronic pain: Secondary | ICD-10-CM | POA: Diagnosis not present

## 2016-06-03 DIAGNOSIS — M5442 Lumbago with sciatica, left side: Secondary | ICD-10-CM | POA: Insufficient documentation

## 2016-06-03 DIAGNOSIS — M961 Postlaminectomy syndrome, not elsewhere classified: Secondary | ICD-10-CM

## 2016-06-03 DIAGNOSIS — G894 Chronic pain syndrome: Secondary | ICD-10-CM | POA: Diagnosis not present

## 2016-06-03 DIAGNOSIS — F1190 Opioid use, unspecified, uncomplicated: Secondary | ICD-10-CM

## 2016-06-03 DIAGNOSIS — M4696 Unspecified inflammatory spondylopathy, lumbar region: Secondary | ICD-10-CM | POA: Diagnosis not present

## 2016-06-03 DIAGNOSIS — Z9889 Other specified postprocedural states: Secondary | ICD-10-CM | POA: Diagnosis not present

## 2016-06-03 DIAGNOSIS — Z79891 Long term (current) use of opiate analgesic: Secondary | ICD-10-CM

## 2016-06-03 DIAGNOSIS — Z86718 Personal history of other venous thrombosis and embolism: Secondary | ICD-10-CM | POA: Insufficient documentation

## 2016-06-03 DIAGNOSIS — M5441 Lumbago with sciatica, right side: Secondary | ICD-10-CM | POA: Insufficient documentation

## 2016-06-03 DIAGNOSIS — E876 Hypokalemia: Secondary | ICD-10-CM | POA: Diagnosis not present

## 2016-06-03 MED ORDER — OXYCODONE HCL 5 MG PO TABS: 5.0000 mg | ORAL_TABLET | Freq: Four times a day (QID) | ORAL | 0 refills | Status: DC

## 2016-06-03 MED ORDER — OXYCODONE HCL 5 MG PO TABS: 5.0000 mg | ORAL_TABLET | Freq: Three times a day (TID) | ORAL | 0 refills | Status: DC

## 2016-06-03 MED ORDER — OXYCODONE HCL 5 MG PO TABS: 5.0000 mg | ORAL_TABLET | Freq: Two times a day (BID) | ORAL | 0 refills | Status: DC

## 2016-06-03 MED ORDER — OXYCODONE HCL 5 MG PO TABS: 5.0000 mg | ORAL_TABLET | Freq: Every day | ORAL | 0 refills | Status: DC

## 2016-06-03 NOTE — Progress Notes (Signed)
Nursing Pain Medication Assessment:  Safety precautions to be maintained throughout the outpatient stay will include: orient to surroundings, keep bed in low position, maintain call bell within reach at all times, provide assistance with transfer out of bed and ambulation.  Medication Inspection Compliance: Pill count conducted under aseptic conditions, in front of the patient. Neither the pills nor the bottle was removed from the patient's sight at any time. Once count was completed pills were immediately returned to the patient in their original bottle.  Medication: Oxycodone IR Pill/Patch Count: 56 of 210 pills remain Pill/Patch Appearance: Markings consistent with prescribed medication Bottle Appearance: Standard pharmacy container. Clearly labeled. Filled Date:04/09/ 2018 Last Medication intake:  Today

## 2016-06-03 NOTE — Patient Instructions (Signed)
Pain Score  Introduction: The pain score used by this practice is the Verbal Numerical Rating Scale (VNRS-11). This is an 11-point scale. It is for adults and children 10 years or older. There are significant differences in how the pain score is reported, used, and applied. Forget everything you learned in the past and learn this scoring system.  General Information: The scale should reflect your current level of pain. Unless you are specifically asked for the level of your worst pain, or your average pain. If you are asked for one of these two, then it should be understood that it is over the past 24 hours.  Basic Activities of Daily Living (ADL): Personal hygiene, dressing, eating, transferring, and using restroom.  Instructions: Most patients tend to report their level of pain as a combination of two factors, their physical pain and their psychosocial pain. This last one is also known as suffering and it is reflection of how physical pain affects you socially and psychologically. From now on, report them separately. From this point on, when asked to report your pain level, report only your physical pain. Use the following table for reference.  Pain Clinic Pain Levels (0-5/10)  Pain Level Score Description  No Pain 0   Mild pain 1 Nagging, annoying, but does not interfere with basic activities of daily living (ADL). Patients are able to eat, bathe, get dressed, toileting (being able to get on and off the toilet and perform personal hygiene functions), transfer (move in and out of bed or a chair without assistance), and maintain continence (able to control bladder and bowel functions). Blood pressure and heart rate are unaffected. A normal heart rate for a healthy adult ranges from 60 to 100 bpm (beats per minute).   Mild to moderate pain 2 Noticeable and distracting. Impossible to hide from other people. More frequent flare-ups. Still possible to adapt and function close to normal. It can be very  annoying and may have occasional stronger flare-ups. With discipline, patients may get used to it and adapt.   Moderate pain 3 Interferes significantly with activities of daily living (ADL). It becomes difficult to feed, bathe, get dressed, get on and off the toilet or to perform personal hygiene functions. Difficult to get in and out of bed or a chair without assistance. Very distracting. With effort, it can be ignored when deeply involved in activities.   Moderately severe pain 4 Impossible to ignore for more than a few minutes. With effort, patients may still be able to manage work or participate in some social activities. Very difficult to concentrate. Signs of autonomic nervous system discharge are evident: dilated pupils (mydriasis); mild sweating (diaphoresis); sleep interference. Heart rate becomes elevated (>115 bpm). Diastolic blood pressure (lower number) rises above 100 mmHg. Patients find relief in laying down and not moving.   Severe pain 5 Intense and extremely unpleasant. Associated with frowning face and frequent crying. Pain overwhelms the senses.  Ability to do any activity or maintain social relationships becomes significantly limited. Conversation becomes difficult. Pacing back and forth is common, as getting into a comfortable position is nearly impossible. Pain wakes you up from deep sleep. Physical signs will be obvious: pupillary dilation; increased sweating; goosebumps; brisk reflexes; cold, clammy hands and feet; nausea, vomiting or dry heaves; loss of appetite; significant sleep disturbance with inability to fall asleep or to remain asleep. When persistent, significant weight loss is observed due to the complete loss of appetite and sleep deprivation.  Blood pressure and heart  rate becomes significantly elevated. Caution: If elevated blood pressure triggers a pounding headache associated with blurred vision, then the patient should immediately seek attention at an urgent or  emergency care unit, as these may be signs of an impending stroke.    Emergency Department Pain Levels (6-10/10)  Emergency Room Pain 6 Severely limiting. Requires emergency care and should not be seen or managed at an outpatient pain management facility. Communication becomes difficult and requires great effort. Assistance to reach the emergency department may be required. Facial flushing and profuse sweating along with potentially dangerous increases in heart rate and blood pressure will be evident.   Distressing pain 7 Self-care is very difficult. Assistance is required to transport, or use restroom. Assistance to reach the emergency department will be required. Tasks requiring coordination, such as bathing and getting dressed become very difficult.   Disabling pain 8 Self-care is no longer possible. At this level, pain is disabling. The individual is unable to do even the most basic activities such as walking, eating, bathing, dressing, transferring to a bed, or toileting. Fine motor skills are lost. It is difficult to think clearly.   Incapacitating pain 9 Pain becomes incapacitating. Thought processing is no longer possible. Difficult to remember your own name. Control of movement and coordination are lost.   The worst pain imaginable 10 At this level, most patients pass out from pain. When this level is reached, collapse of the autonomic nervous system occurs, leading to a sudden drop in blood pressure and heart rate. This in turn results in a temporary and dramatic drop in blood flow to the brain, leading to a loss of consciousness. Fainting is one of the bodys self defense mechanisms. Passing out puts the brain in a calmed state and causes it to shut down for a while, in order to begin the healing process.    Summary: 1. Refer to this scale when providing [place] with your pain level. 2. Be accurate and careful when reporting your pain level. This will help with your care. 3. Over-reporting  your pain level will lead to loss of credibility. 4. Even a level of 1/10 means that there is pain and will be treated at our facility. 5. High, inaccurate reporting will be documented as Symptom Exaggeration, leading to loss of credibility and suspicions of possible secondary gains such as obtaining more narcotics, or wanting to appear disabled, for fraudulent reasons. 6. Only pain levels of 5 or below will be seen at our facility. 7. Pain levels of 6 and above will be sent to the Emergency Department and the appointment cancelled. _____________________________________________________________________________________________  DRUG HOLIDAYS  Definitions Tolerance: defined as the progressively decreased responsiveness to a drug. Occurs when the drug is used repeatedly and the body adapts to the continued presence of the drug. As a result, a larger dose of the drug is needed to achieve the effect originally obtained by a smaller dose. It is thought to be due to the formation of excess opioid receptors.  Drug Holiday: is when a patient stops taking a medication(s) for a period of time; anywhere from a few days to several weeks.  Withdrawals: refers to the wide range of symptoms that occur after stopping or dramatically reducing opiate drugs after heavy and prolonged use. Withdrawal symptoms do not occur to patients that use low dose opioids, or those who take the medication sporadically. Contrary to benzodiazepine (example: Valium, Xanax, etc.) or alcohol withdrawals (Delirium Tremens), opioid withdrawals are not lethal. Withdrawals are the physical  manifestation of the body getting rid of the excess receptors.  Purpose To eliminate tolerance.  Duration of Holiday 14 consecutive days. (2 weeks)  Expected Symptoms Early symptoms of withdrawal include:  Agitation  Anxiety  Muscle aches  Increased tearing  Insomnia  Runny nose  Sweating  Yawning  Late symptoms of withdrawal  include:  Abdominal cramping  Diarrhea  Dilated pupils  Goose bumps  Nausea  Vomiting  Opioid withdrawal reactions are very uncomfortable but are not life-threatening. Symptoms usually start within 12 hours of last opioid dose and within 30 hours of last methadone exposure.  Duration of Symptoms 48 to 72 hours for short acting medications and 2 to 14 days for methadone.  Treatment  Clonidine (Catapres) or tizanidine (Zanaflex) for agitation, sweating, tearing, runny nose.  Promethazine (Phenergan) for nausea, vomiting.  NSAIDs for pain.  Benefits  Improved effectiveness of opioids.  Decreased opioid dose needed to achieve benefits.  Improved pain with lesser dose. _____________________________________________________________________________________________

## 2016-06-04 ENCOUNTER — Telehealth: Payer: Self-pay

## 2016-06-04 NOTE — Telephone Encounter (Signed)
Patient called and left a voicemail saying that his prescription he got yesterday says the fill date is 06/21/16 and he runs out on 06/14/16. Please check on this and call him. Thank you

## 2016-06-07 ENCOUNTER — Encounter: Payer: Self-pay | Admitting: Pain Medicine

## 2016-06-07 ENCOUNTER — Ambulatory Visit: Payer: PPO | Attending: Pain Medicine | Admitting: Pain Medicine

## 2016-06-07 ENCOUNTER — Telehealth: Payer: Self-pay | Admitting: *Deleted

## 2016-06-07 VITALS — BP 147/67 | HR 88 | Temp 97.8°F | Resp 16

## 2016-06-07 DIAGNOSIS — G894 Chronic pain syndrome: Secondary | ICD-10-CM | POA: Diagnosis not present

## 2016-06-07 DIAGNOSIS — F1190 Opioid use, unspecified, uncomplicated: Secondary | ICD-10-CM

## 2016-06-07 DIAGNOSIS — Z79891 Long term (current) use of opiate analgesic: Secondary | ICD-10-CM

## 2016-06-07 MED ORDER — OXYCODONE HCL 10 MG PO TABS: 10.0000 mg | ORAL_TABLET | Freq: Every day | ORAL | 0 refills | Status: DC

## 2016-06-07 NOTE — Progress Notes (Signed)
Nursing Pain Medication Assessment:  Safety precautions to be maintained throughout the outpatient stay will include: orient to surroundings, keep bed in low position, maintain call bell within reach at all times, provide assistance with transfer out of bed and ambulation.  Medication Inspection Compliance: Pill count conducted under aseptic conditions, in front of the patient. Neither the pills nor the bottle was removed from the patient's sight at any time. Once count was completed pills were immediately returned to the patient in their original bottle.  Medication: Oxycodone IR Pill/Patch Count: 37 of 210 pills remain Pill/Patch Appearance: Markings consistent with prescribed medication Bottle Appearance: Standard pharmacy container. Clearly labeled. Filled Date:04 /09 / 2018 Last Medication intake:  Today

## 2016-06-07 NOTE — Progress Notes (Signed)
The patient returns to the clinics today for a nurse visit since he is going to run out of medicine early. The problem here was that the insurance company did not allow him to get the necessary prescriptions for his opioid tapering. This created a big issue due to the fact that he has significant drop in his dosing that should have not occurred had he gotten his prescriptions filled as I wrote them. Because of this reason, he ended up taking more medication that I had prescribed to compensate for those that were not failed by the insurance company and this completely messed up are pill counts. Today we've had to given another prescription to compensate for this problem created by the insurance company.

## 2016-06-07 NOTE — Telephone Encounter (Signed)
Patient instructed to bring pill biottles and prescriptions back to be verified.  Patient states understanding and will come in.

## 2016-06-07 NOTE — Patient Instructions (Addendum)
You have been given 1 prescription of Oxycodone 10 mg to be filled on 06-14-16 and TLU 06-21-16  Steps to Quit Smoking Smoking tobacco can be bad for your health. It can also affect almost every organ in your body. Smoking puts you and people around you at risk for many serious long-lasting (chronic) diseases. Quitting smoking is hard, but it is one of the best things that you can do for your health. It is never too late to quit. What are the benefits of quitting smoking? When you quit smoking, you lower your risk for getting serious diseases and conditions. They can include:  Lung cancer or lung disease.  Heart disease.  Stroke.  Heart attack.  Not being able to have children (infertility).  Weak bones (osteoporosis) and broken bones (fractures). If you have coughing, wheezing, and shortness of breath, those symptoms may get better when you quit. You may also get sick less often. If you are pregnant, quitting smoking can help to lower your chances of having a baby of low birth weight. What can I do to help me quit smoking? Talk with your doctor about what can help you quit smoking. Some things you can do (strategies) include:  Quitting smoking totally, instead of slowly cutting back how much you smoke over a period of time.  Going to in-person counseling. You are more likely to quit if you go to many counseling sessions.  Using resources and support systems, such as:  Online chats with a Social worker.  Phone quitlines.  Printed Furniture conservator/restorer.  Support groups or group counseling.  Text messaging programs.  Mobile phone apps or applications.  Taking medicines. Some of these medicines may have nicotine in them. If you are pregnant or breastfeeding, do not take any medicines to quit smoking unless your doctor says it is okay. Talk with your doctor about counseling or other things that can help you. Talk with your doctor about using more than one strategy at the same time, such  as taking medicines while you are also going to in-person counseling. This can help make quitting easier. What things can I do to make it easier to quit? Quitting smoking might feel very hard at first, but there is a lot that you can do to make it easier. Take these steps:  Talk to your family and friends. Ask them to support and encourage you.  Call phone quitlines, reach out to support groups, or work with a Social worker.  Ask people who smoke to not smoke around you.  Avoid places that make you want (trigger) to smoke, such as:  Bars.  Parties.  Smoke-break areas at work.  Spend time with people who do not smoke.  Lower the stress in your life. Stress can make you want to smoke. Try these things to help your stress:  Getting regular exercise.  Deep-breathing exercises.  Yoga.  Meditating.  Doing a body scan. To do this, close your eyes, focus on one area of your body at a time from head to toe, and notice which parts of your body are tense. Try to relax the muscles in those areas.  Download or buy apps on your mobile phone or tablet that can help you stick to your quit plan. There are many free apps, such as QuitGuide from the [employer] Office manager for Disease Control and Prevention). You can find more support from smokefree.gov and other websites. This information is not intended to replace advice given to you by your health care provider. Make  sure you discuss any questions you have with your health care provider. Document Released: 11/14/2008 Document Revised: 09/16/2015 Document Reviewed: 06/04/2014 Elsevier Interactive Patient Education  2017 [employer].

## 2016-06-10 NOTE — Telephone Encounter (Signed)
Managed by Dr. Consuela Mimes. Was refilled by him 5-7.2018

## 2016-06-11 DIAGNOSIS — M961 Postlaminectomy syndrome, not elsewhere classified: Secondary | ICD-10-CM | POA: Diagnosis not present

## 2016-06-11 DIAGNOSIS — M549 Dorsalgia, unspecified: Secondary | ICD-10-CM | POA: Diagnosis not present

## 2016-06-11 DIAGNOSIS — T84296A Other mechanical complication of internal fixation device of vertebrae, initial encounter: Secondary | ICD-10-CM | POA: Diagnosis not present

## 2016-06-15 ENCOUNTER — Other Ambulatory Visit: Payer: Self-pay | Admitting: Orthopaedic Surgery

## 2016-06-15 DIAGNOSIS — T84296A Other mechanical complication of internal fixation device of vertebrae, initial encounter: Secondary | ICD-10-CM

## 2016-06-17 ENCOUNTER — Ambulatory Visit
Admission: RE | Admit: 2016-06-17 | Discharge: 2016-06-17 | Disposition: A | Discharge status: Home / Self Care | Payer: PPO | Source: Ambulatory Visit | Attending: Oncology | Admitting: Oncology

## 2016-06-17 ENCOUNTER — Other Ambulatory Visit: Payer: Self-pay | Admitting: Family Medicine

## 2016-06-17 DIAGNOSIS — Z122 Encounter for screening for malignant neoplasm of respiratory organs: Secondary | ICD-10-CM | POA: Insufficient documentation

## 2016-06-17 DIAGNOSIS — Z87891 Personal history of nicotine dependence: Secondary | ICD-10-CM | POA: Diagnosis not present

## 2016-06-17 DIAGNOSIS — Z9049 Acquired absence of other specified parts of digestive tract: Secondary | ICD-10-CM | POA: Diagnosis not present

## 2016-06-17 DIAGNOSIS — I2510 Atherosclerotic heart disease of native coronary artery without angina pectoris: Secondary | ICD-10-CM | POA: Insufficient documentation

## 2016-06-17 DIAGNOSIS — J439 Emphysema, unspecified: Secondary | ICD-10-CM | POA: Insufficient documentation

## 2016-06-17 DIAGNOSIS — D3500 Benign neoplasm of unspecified adrenal gland: Secondary | ICD-10-CM | POA: Diagnosis not present

## 2016-06-17 DIAGNOSIS — I700 Atherosclerosis of aorta: Secondary | ICD-10-CM | POA: Insufficient documentation

## 2016-06-17 DIAGNOSIS — R918 Other nonspecific abnormal finding of lung field: Secondary | ICD-10-CM | POA: Insufficient documentation

## 2016-06-17 DIAGNOSIS — F17210 Nicotine dependence, cigarettes, uncomplicated: Secondary | ICD-10-CM | POA: Diagnosis not present

## 2016-06-17 DIAGNOSIS — E7800 Pure hypercholesterolemia, unspecified: Secondary | ICD-10-CM

## 2016-06-18 ENCOUNTER — Ambulatory Visit (INDEPENDENT_AMBULATORY_CARE_PROVIDER_SITE_OTHER): Payer: PPO | Admitting: Vascular Surgery

## 2016-06-18 ENCOUNTER — Encounter (INDEPENDENT_AMBULATORY_CARE_PROVIDER_SITE_OTHER): Payer: Self-pay | Admitting: Vascular Surgery

## 2016-06-18 ENCOUNTER — Ambulatory Visit (INDEPENDENT_AMBULATORY_CARE_PROVIDER_SITE_OTHER): Payer: PPO

## 2016-06-18 VITALS — BP 138/81 | HR 76 | Resp 16 | Ht 73.0 in | Wt 180.0 lb

## 2016-06-18 DIAGNOSIS — I10 Essential (primary) hypertension: Secondary | ICD-10-CM

## 2016-06-18 DIAGNOSIS — I714 Abdominal aortic aneurysm, without rupture, unspecified: Secondary | ICD-10-CM

## 2016-06-18 DIAGNOSIS — E7800 Pure hypercholesterolemia, unspecified: Secondary | ICD-10-CM

## 2016-06-18 NOTE — Assessment & Plan Note (Signed)
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 This has increased about 2 mm from his study last fall. Says below the size for prophylactic repair, but it has increased in size and will continue to be watched reasonably closely. We will continue to follow this on 6 month intervals. Blood pressure controlled smoking cessation would be of benefit.

## 2016-06-18 NOTE — Assessment & Plan Note (Signed)
lipid control important in reducing the progression of atherosclerotic disease. Continue statin therapy  

## 2016-06-18 NOTE — Assessment & Plan Note (Signed)
blood pressure control important in reducing the progression of atherosclerotic disease. On appropriate oral medications.  

## 2016-06-18 NOTE — Progress Notes (Signed)
MRN : [id number]  Justin Harrington is a 71 y.o. (August 01, 1945) male who presents with chief complaint of  Chief Complaint  Patient presents with  . AAA    Ultrasound follow up  .  History of Present Illness: Patient returns today in follow up of AAA. He is having chronic low back problems and is actually scheduled to see a spine surgeon soon. He has already had a spinal fusion. 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 This has increased about 2 mm from his study last fall. He does not have any current aneurysm related symptoms. His low back pain is chronic. He does not have new abdominal pain or signs of peripheral embolization.  Current Outpatient Prescriptions  Medication Sig Dispense Refill  . aspirin EC 81 MG tablet Take 1 tablet by mouth daily.    Marland Kitchen atorvastatin (LIPITOR) 80 MG tablet TAKE 1 TABLET(80 MG) BY MOUTH DAILY 90 tablet 4  . gabapentin (NEURONTIN) 100 MG capsule TK 1 TO 3 CS PO QID PRN. FOLLOW WRITTEN TITRATION SCHEDULE  1  . hydrochlorothiazide (HYDRODIURIL) 25 MG tablet TAKE 1 TABLET(25 MG) BY MOUTH DAILY 90 tablet 4  . lubiprostone (AMITIZA) 24 MCG capsule Take 24 mcg by mouth 2 (two) times daily with a meal.    . naloxone (NARCAN) 2 MG/2ML injection   1  . [START ON 06/21/2016] oxyCODONE (OXY IR/ROXICODONE) 5 MG immediate release tablet Take 1 tablet (5 mg total) by mouth 4 (four) times daily. Max: 4/day 28 tablet 0  . [START ON 06/28/2016] oxyCODONE (OXY IR/ROXICODONE) 5 MG immediate release tablet Take 1 tablet (5 mg total) by mouth 3 (three) times daily. Max: 3/day 21 tablet 0  . [START ON 07/05/2016] oxyCODONE (OXY IR/ROXICODONE) 5 MG immediate release tablet Take 1 tablet (5 mg total) by mouth 2 (two) times daily. Max: 2/day 14 tablet 0  . [START ON 07/12/2016] oxyCODONE (OXY IR/ROXICODONE) 5 MG immediate release tablet Take 1 tablet (5 mg total) by mouth daily. Max: 1/day 7 tablet 0  . [START ON 07/26/2016] oxyCODONE (OXY IR/ROXICODONE) 5 MG immediate release  tablet Take 1 tablet (5 mg total) by mouth 4 (four) times daily. Max: 4/day 28 tablet 0  . [START ON 08/02/2016] oxyCODONE (OXY IR/ROXICODONE) 5 MG immediate release tablet Take 1 tablet (5 mg total) by mouth 3 (three) times daily. Max: 3/day 21 tablet 0  . [START ON 08/09/2016] oxyCODONE (OXY IR/ROXICODONE) 5 MG immediate release tablet Take 1 tablet (5 mg total) by mouth 2 (two) times daily. Max: 2/day 14 tablet 0  . [START ON 08/16/2016] oxyCODONE (OXY IR/ROXICODONE) 5 MG immediate release tablet Take 1 tablet (5 mg total) by mouth daily. Max: 1/day 7 tablet 0  . Oxycodone HCl 10 MG TABS Take 1 tablet (10 mg total) by mouth 5 (five) times daily. 35 tablet 0  . traZODone (DESYREL) 150 MG tablet TAKE 1 TABLET(150 MG) BY MOUTH AT BEDTIME AS NEEDED 90 tablet 4  . Wheat Dextrin (BENEFIBER) POWD Stir 2 tsp. TID into 4-8 oz of any non-carbonated beverage or soft food (hot or cold) 500 g PRN   No current facility-administered medications for this visit.     Past Medical History:  Diagnosis Date  . Aortic aneurysm ([place])   . CAD (coronary artery disease)   . DDD (degenerative disc disease), lumbar   . Hyperlipidemia   . Personal history of tobacco use, presenting hazards to health 06/12/2015  . Personal history of tobacco use, presenting  hazards to health 06/12/2015  . Prostate cancer [facility])     Past Surgical History:  Procedure Laterality Date  . BACK SURGERY  1998   Lumbar spine fusion  . carotid doppler ultrasound  10/05/2011   75-90% RCA occlusion, 50% on left  . CAROTID ENDARTERECTOMY Right 12/02/2011   Dr. Lucky Cowboy  . CHOLECYSTECTOMY  10/02/2010   Laparoscopic, Dr. Pat Patrick, [facility]  . 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 Cholelithiasis and acute cholecystiis. Mild enlargement of left adrenal gland  . PROSTATE SURGERY  10/19/2012   prostatectomy. [facility]; Laser assisted, Done by [name] for prostate cancer     Social History Social History  Substance Use Topics  . Smoking status: Current Every Day Smoker    Packs/day: 0.75    Years: 50.00    Types: Cigarettes  . Smokeless tobacco: Never Used  . Alcohol use No    Family History Family History  Problem Relation Age of Onset  . Heart attack Mother   . Congestive Heart Failure Father   . Cerebral palsy Son     No Known Allergies   REVIEW OF SYSTEMS (Negative unless checked)  Constitutional: [] Weight loss  [] Fever  [] Chills Cardiac: [] Chest pain   [] Chest pressure   [] Palpitations   [] Shortness of breath when laying flat   [] Shortness of breath at rest   [x] Shortness of breath with exertion. Vascular:  [x] Pain in legs with walking   [] Pain in legs at rest   [] Pain in legs when laying flat   [] Claudication   [] Pain in feet when walking  [] Pain in feet at rest  [] Pain in feet when laying flat   [] History of DVT   [] Phlebitis   [] Swelling in legs   [] Varicose veins   [] Non-healing ulcers Pulmonary:   [] Uses home oxygen   [] Productive cough   [] Hemoptysis   [] Wheeze  [] COPD   [] Asthma Neurologic:  [] Dizziness  [] Blackouts   [] Seizures   [] History of stroke   [] History of TIA  [] Aphasia   [] Temporary blindness   [] Dysphagia   [] Weakness or numbness in arms   [] Weakness or numbness in legs Musculoskeletal:  [x] Arthritis   [] Joint swelling   [] Joint pain   [x] Low back pain Hematologic:  [] Easy bruising  [] Easy bleeding   [] Hypercoagulable state   [] Anemic   Gastrointestinal:  [] Blood in stool   [] Vomiting blood  [] Gastroesophageal reflux/heartburn   [] Abdominal pain Genitourinary:  [] Chronic kidney disease   [] Difficult urination  [] Frequent urination  [] Burning with urination   [] Hematuria Skin:  [] Rashes   [] Ulcers   [] Wounds Psychological:  [] History of anxiety   []  History of major depression.  Physical Examination  BP 138/81 (BP Location: Right Arm)   Pulse 76   Resp 16   Ht 6\' 1"  (1.854 m)   Wt 81.6 kg (180 lb)   BMI 23.75 kg/m   Gen:  WD/WN, NAD Head: [place]/AT, No temporalis wasting. Ear/Nose/Throat: Hearing grossly intact, nares w/o erythema or drainage, trachea midline Eyes: Conjunctiva clear. Sclera non-icteric Neck: Supple.  No JVD.  Pulmonary:  Good air movement, no use of accessory muscles.  Cardiac: RRR, normal S1, S2 Vascular:  Vessel Right Left  Radial Palpable Palpable                                   Gastrointestinal: soft, non-tender/non-distended. Enlarged aortic  impulse Musculoskeletal: M/S 5/5 throughout.  No deformity or atrophy. No edema. Neurologic: Sensation grossly intact in extremities.  Symmetrical.  Speech is fluent.  Psychiatric: Judgment intact, Mood & affect appropriate for pt's clinical situation. Dermatologic: No rashes or ulcers noted.  No cellulitis or open wounds.       Labs Recent Results (from the past 2160 hour(s))  ToxASSURE Select 13 (MW), Urine     Status: None   Collection Time: 03/30/16  9:48 AM  Result Value Ref Range   ToxAssure Select 13 FINAL     Comment: ==================================================================== TOXASSURE SELECT 13 (MW) ==================================================================== Test                             Result       Flag       Units Drug Present and Declared for Prescription Verification   Oxycodone                      >2632        EXPECTED   ng/mg creat   Oxymorphone                    >2632        EXPECTED   ng/mg creat   Noroxycodone                   >2632        EXPECTED   ng/mg creat   Noroxymorphone                 >2632        EXPECTED   ng/mg creat    Sources of oxycodone are scheduled prescription medications.    Oxymorphone, noroxycodone, and noroxymorphone are expected    metabolites of oxycodone. Oxymorphone is also available as a    scheduled prescription medication. ==================================================================== Test                      Result    Flag   Units       Ref Range   Creatinine              380              mg/dL      >=20 ====== ============================================================== Declared Medications:  The flagging and interpretation on this report are based on the  following declared medications.  Unexpected results may arise from  inaccuracies in the declared medications.  **Note: The testing scope of this panel includes these medications:  Oxycodone  **Note: The testing scope of this panel does not include following  reported medications:  Aspirin  Atorvastatin  Hydrochlorothiazide  Lubiprostone  Naloxone  Supplement  Trazodone ==================================================================== For clinical consultation, please call [phone]. ====================================================================     Radiology Ct Chest Lung Cancer Screening Low Dose Wo Contrast  Result Date: 06/17/2016 CLINICAL DATA:  71 year old male current smoker, with 38 pack-year history of smoking, for follow-up lung cancer screening EXAM: CT CHEST WITHOUT CONTRAST LOW-DOSE FOR LUNG CANCER SCREENING TECHNIQUE: Multidetector CT imaging of the chest was performed following the standard protocol without IV contrast. COMPARISON:  Low dose CT lung cancer screening dated 06/13/2015 FINDINGS: Cardiovascular: Heart is normal in size.  No pericardial effusion. Three vessel coronary atherosclerosis. No evidence of thoracic aortic aneurysm. Mild atherosclerotic calcifications of the aortic arch. Mediastinum/Nodes: No suspicious mediastinal lymphadenopathy. Visualized thyroid is unremarkable. Lungs/Pleura: Mild  biapical pleural-parenchymal scarring. Mild centrilobular and paraseptal emphysematous changes, upper lobe predominant. Mild subpleural reticulation. Two right lung nodules measuring up to 5.6 mm, one of which is partially calcified, unchanged. No focal consolidation. No pleural effusion or pneumothorax. Upper Abdomen: Visualized upper  abdomen is notable for cholecystectomy clips and a 1.8 cm benign adrenal adenoma. Musculoskeletal: Visualized osseous structures are within normal limits. IMPRESSION: Lung-RADS 2, benign appearance or behavior. Continue annual screening with low-dose chest CT without contrast in 12 months. Electronically Signed   By: Julian Hy M.D.   On: 06/17/2016 15:27     Assessment/Plan  Hypertension blood pressure control important in reducing the progression of atherosclerotic disease. On appropriate oral medications.   Hypercholesteremia lipid control important in reducing the progression of atherosclerotic disease. Continue statin therapy   AAA (abdominal aortic aneurysm) without rupture (HCC) His aorta measured 4.47 cm in maximal diameter today. This has increased about 2 mm from his study last fall. Says below the size for prophylactic repair, but it has increased in size and will continue to be watched reasonably closely. We will continue to follow this on 6 month intervals. Blood pressure controlled smoking cessation would be of benefit.    Leotis Pain, MD  06/18/2016 10:31 AM    This note was created with Dragon medical transcription system.  Any errors from dictation are purely unintentional

## 2016-06-21 ENCOUNTER — Encounter: Payer: Self-pay | Admitting: *Deleted

## 2016-06-21 ENCOUNTER — Ambulatory Visit
Admission: RE | Admit: 2016-06-21 | Discharge: 2016-06-21 | Disposition: A | Discharge status: Home / Self Care | Payer: PPO | Source: Ambulatory Visit | Attending: Orthopaedic Surgery | Admitting: Orthopaedic Surgery

## 2016-06-21 DIAGNOSIS — T84296A Other mechanical complication of internal fixation device of vertebrae, initial encounter: Secondary | ICD-10-CM | POA: Diagnosis not present

## 2016-06-21 DIAGNOSIS — M48061 Spinal stenosis, lumbar region without neurogenic claudication: Secondary | ICD-10-CM | POA: Diagnosis not present

## 2016-06-25 DIAGNOSIS — M961 Postlaminectomy syndrome, not elsewhere classified: Secondary | ICD-10-CM | POA: Diagnosis not present

## 2016-06-25 DIAGNOSIS — T84296S Other mechanical complication of internal fixation device of vertebrae, sequela: Secondary | ICD-10-CM | POA: Diagnosis not present

## 2016-07-02 ENCOUNTER — Ambulatory Visit (INDEPENDENT_AMBULATORY_CARE_PROVIDER_SITE_OTHER): Payer: PPO | Admitting: Family Medicine

## 2016-07-02 ENCOUNTER — Encounter: Payer: Self-pay | Admitting: Family Medicine

## 2016-07-02 VITALS — BP 136/62 | HR 72 | Temp 97.9°F | Resp 16 | Wt 181.0 lb

## 2016-07-02 DIAGNOSIS — M47816 Spondylosis without myelopathy or radiculopathy, lumbar region: Secondary | ICD-10-CM

## 2016-07-02 DIAGNOSIS — I714 Abdominal aortic aneurysm, without rupture, unspecified: Secondary | ICD-10-CM

## 2016-07-02 DIAGNOSIS — M4696 Unspecified inflammatory spondylopathy, lumbar region: Secondary | ICD-10-CM

## 2016-07-02 DIAGNOSIS — I739 Peripheral vascular disease, unspecified: Secondary | ICD-10-CM

## 2016-07-02 DIAGNOSIS — I779 Disorder of arteries and arterioles, unspecified: Secondary | ICD-10-CM | POA: Diagnosis not present

## 2016-07-02 DIAGNOSIS — I10 Essential (primary) hypertension: Secondary | ICD-10-CM | POA: Diagnosis not present

## 2016-07-02 DIAGNOSIS — Z01818 Encounter for other preprocedural examination: Secondary | ICD-10-CM | POA: Diagnosis not present

## 2016-07-02 DIAGNOSIS — I2510 Atherosclerotic heart disease of native coronary artery without angina pectoris: Secondary | ICD-10-CM

## 2016-07-02 DIAGNOSIS — F17210 Nicotine dependence, cigarettes, uncomplicated: Secondary | ICD-10-CM

## 2016-07-02 NOTE — Progress Notes (Addendum)
Patient: Justin Harrington Male    DOB: 1945-09-16   71 y.o.   MRN: [id number] Visit Date: 07/02/2016  Today's Provider: [name], MD   Chief Complaint  Patient presents with  . Pre-op Exam   Subjective:    HPI Patient presents today for a pre-op visit. Patient reports that he will be having orthopedic surgery in [place] in the next 2 weeks by Dr. Sherlyn Lick at [facility]. Anticipates removal spine fixation device. Has had multiple surgeries in the past with no history of surgical or anesthetic complications.  Past Surgical History:  Procedure Laterality Date  . BACK SURGERY  1998   Lumbar spine fusion  . carotid doppler ultrasound  10/05/2011   75-90% RCA occlusion, 50% on left  . CAROTID ENDARTERECTOMY Right 12/02/2011   Dr. Lucky Cowboy  . CHOLECYSTECTOMY  10/02/2010   Laparoscopic, Dr. Pat Patrick, [facility]  . 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 Cholelithiasis and acute cholecystiis. Mild enlargement of left adrenal gland  . PROSTATE SURGERY  10/19/2012   prostatectomy. [facility]; Laser assisted, Done by [name] for prostate cancer       No Known Allergies   Current Outpatient Prescriptions:  .  aspirin EC 81 MG tablet, Take 1 tablet by mouth daily., Disp: , Rfl:  .  atorvastatin (LIPITOR) 80 MG tablet, TAKE 1 TABLET(80 MG) BY MOUTH DAILY, Disp: 90 tablet, Rfl: 4 .  gabapentin (NEURONTIN) 100 MG capsule, TK 1 TO 3 CS PO QID PRN. FOLLOW WRITTEN TITRATION SCHEDULE, Disp: , Rfl: 1 .  hydrochlorothiazide (HYDRODIURIL) 25 MG tablet, TAKE 1 TABLET(25 MG) BY MOUTH DAILY, Disp: 90 tablet, Rfl: 4 .  lubiprostone (AMITIZA) 24 MCG capsule, Take 24 mcg by mouth 2 (two) times daily with a meal., Disp: , Rfl:  .  naloxone (NARCAN) 2 MG/2ML injection, , Disp: , Rfl: 1 .  oxyCODONE (OXY IR/ROXICODONE) 5 MG immediate release tablet, Take 1 tablet (5 mg total) by mouth 3 (three) times  daily. Max: 3/day, Disp: 21 tablet, Rfl: 0 .  traZODone (DESYREL) 150 MG tablet, TAKE 1 TABLET(150 MG) BY MOUTH AT BEDTIME AS NEEDED, Disp: 90 tablet, Rfl: 4 .  Wheat Dextrin (BENEFIBER) POWD, Stir 2 tsp. TID into 4-8 oz of any non-carbonated beverage or soft food (hot or cold), Disp: 500 g, Rfl: PRN .  oxyCODONE (OXY IR/ROXICODONE) 5 MG immediate release tablet, Take 1 tablet (5 mg total) by mouth 4 (four) times daily. Max: 4/day, Disp: 28 tablet, Rfl: 0 .  [START ON 07/05/2016] oxyCODONE (OXY IR/ROXICODONE) 5 MG immediate release tablet, Take 1 tablet (5 mg total) by mouth 2 (two) times daily. Max: 2/day, Disp: 14 tablet, Rfl: 0 .  [START ON 07/12/2016] oxyCODONE (OXY IR/ROXICODONE) 5 MG immediate release tablet, Take 1 tablet (5 mg total) by mouth daily. Max: 1/day, Disp: 7 tablet, Rfl: 0 .  [START ON 07/26/2016] oxyCODONE (OXY IR/ROXICODONE) 5 MG immediate release tablet, Take 1 tablet (5 mg total) by mouth 4 (four) times daily. Max: 4/day, Disp: 28 tablet, Rfl: 0 .  [START ON 08/02/2016] oxyCODONE (OXY IR/ROXICODONE) 5 MG immediate release tablet, Take 1 tablet (5 mg total) by mouth 3 (three) times daily. Max: 3/day, Disp: 21 tablet, Rfl: 0 .  [START ON 08/09/2016] oxyCODONE (OXY IR/ROXICODONE) 5 MG immediate release tablet, Take 1 tablet (5 mg total) by mouth 2 (two) times daily. Max: 2/day, Disp: 14  tablet, Rfl: 0 .  [START ON 08/16/2016] oxyCODONE (OXY IR/ROXICODONE) 5 MG immediate release tablet, Take 1 tablet (5 mg total) by mouth daily. Max: 1/day, Disp: 7 tablet, Rfl: 0 .  Oxycodone HCl 10 MG TABS, Take 1 tablet (10 mg total) by mouth 5 (five) times daily., Disp: 35 tablet, Rfl: 0  Review of Systems  Constitutional: Negative.  Negative for fever.  Respiratory: Negative for cough, chest tightness and wheezing.   Cardiovascular: Negative for chest pain and leg swelling.  Musculoskeletal: Positive for arthralgias, back pain and myalgias.  Neurological: Negative.     Social History  Substance Use  Topics  . Smoking status: Current Every Day Smoker    Packs/day: 0.75    Years: 50.00    Types: Cigarettes  . Smokeless tobacco: Never Used  . Alcohol use No   Objective:   BP 136/62 (BP Location: Right Arm, Patient Position: Sitting, Cuff Size: Normal)   Pulse 72   Temp 97.9 F (36.6 C)   Resp 16   Wt 181 lb (82.1 kg)   SpO2 96%   BMI 23.88 kg/m  Vitals:   07/02/16 1623  BP: 136/62  Pulse: 72  Resp: 16  Temp: 97.9 F (36.6 C)  SpO2: 96%  Weight: 181 lb (82.1 kg)     Physical Exam   General Appearance:    Alert, cooperative, no distress  Eyes:    PERRL, conjunctiva/corneas clear, EOM's intact       Lungs:     Clear to auscultation bilaterally, respirations unlabored  Heart:    Regular rate and rhythm  Neurologic:   Awake, alert, oriented x 3. No apparent focal neurological           defect.           Assessment & Plan:     1. Pre-op exam Normal exam today, but he has multiple risk factors for cardiac disease and incidental finding of 3V coronary atherosclerosis on LDCT screenings. Needs further evaluation for cardiac risk.  - EKG 12-Lead  2. Bilateral carotid artery disease ([place]) Followed by Dr. Lucky Cowboy  3. AAA (abdominal aortic aneurysm) without rupture (HCC) Followed by Dr. Lucky Cowboy.   4. Essential hypertension Stable Continue current medications.    5. Lumbar facet syndrome (Bilateral) (R>L)   6. Smoking greater than 30 pack years   7. Coronary artery disease involving native coronary artery of native heart without angina pectoris   - Ambulatory referral to Cardiology       [name], MD  [facility]  Addendum 07/22/2016 Cardiac pre-op evaluation done by Dr. Chrissie Noa. Patient is at low risk for NON-cardiac complications of surgery and anesthesia, and is clear for anticipated procedures from non-cardiac standpoint.

## 2016-07-12 DIAGNOSIS — E7800 Pure hypercholesterolemia, unspecified: Secondary | ICD-10-CM | POA: Diagnosis not present

## 2016-07-12 DIAGNOSIS — R079 Chest pain, unspecified: Secondary | ICD-10-CM | POA: Diagnosis not present

## 2016-07-12 DIAGNOSIS — I10 Essential (primary) hypertension: Secondary | ICD-10-CM | POA: Diagnosis not present

## 2016-07-12 DIAGNOSIS — I779 Disorder of arteries and arterioles, unspecified: Secondary | ICD-10-CM | POA: Diagnosis not present

## 2016-07-12 DIAGNOSIS — I25708 Atherosclerosis of coronary artery bypass graft(s), unspecified, with other forms of angina pectoris: Secondary | ICD-10-CM | POA: Diagnosis not present

## 2016-07-12 DIAGNOSIS — I714 Abdominal aortic aneurysm, without rupture: Secondary | ICD-10-CM | POA: Diagnosis not present

## 2016-07-15 DIAGNOSIS — M961 Postlaminectomy syndrome, not elsewhere classified: Secondary | ICD-10-CM | POA: Diagnosis not present

## 2016-07-15 DIAGNOSIS — T84296S Other mechanical complication of internal fixation device of vertebrae, sequela: Secondary | ICD-10-CM | POA: Diagnosis not present

## 2016-07-15 DIAGNOSIS — Z6823 Body mass index (BMI) 23.0-23.9, adult: Secondary | ICD-10-CM | POA: Diagnosis not present

## 2016-07-21 ENCOUNTER — Telehealth: Payer: Self-pay | Admitting: Family Medicine

## 2016-07-21 DIAGNOSIS — Z01818 Encounter for other preprocedural examination: Secondary | ICD-10-CM | POA: Diagnosis not present

## 2016-07-21 DIAGNOSIS — M961 Postlaminectomy syndrome, not elsewhere classified: Secondary | ICD-10-CM | POA: Diagnosis not present

## 2016-07-21 NOTE — Telephone Encounter (Signed)
Please advise 

## 2016-07-21 NOTE — Telephone Encounter (Signed)
Pt called wanting to know if we have faxed everything to his Spine doctor about his preop visit.  The phone number to their office is [phone]  They need this info asap.  His surgery is on the 27th at [facility]  Thanks teri

## 2016-07-22 NOTE — Telephone Encounter (Signed)
Left detailed message notifying.

## 2016-07-22 NOTE — Telephone Encounter (Signed)
Everything was faxed today.

## 2016-07-30 DIAGNOSIS — E785 Hyperlipidemia, unspecified: Secondary | ICD-10-CM | POA: Diagnosis not present

## 2016-07-30 DIAGNOSIS — T8484XA Pain due to internal orthopedic prosthetic devices, implants and grafts, initial encounter: Secondary | ICD-10-CM | POA: Diagnosis not present

## 2016-07-30 DIAGNOSIS — I10 Essential (primary) hypertension: Secondary | ICD-10-CM | POA: Diagnosis not present

## 2016-07-30 DIAGNOSIS — Y831 Surgical operation with implant of artificial internal device as the cause of abnormal reaction of the patient, or of later complication, without mention of misadventure at the time of the procedure: Secondary | ICD-10-CM | POA: Diagnosis not present

## 2016-07-30 DIAGNOSIS — I2510 Atherosclerotic heart disease of native coronary artery without angina pectoris: Secondary | ICD-10-CM | POA: Diagnosis not present

## 2016-07-30 DIAGNOSIS — T84296S Other mechanical complication of internal fixation device of vertebrae, sequela: Secondary | ICD-10-CM | POA: Diagnosis not present

## 2016-07-30 DIAGNOSIS — Z981 Arthrodesis status: Secondary | ICD-10-CM | POA: Diagnosis not present

## 2016-07-30 DIAGNOSIS — M48061 Spinal stenosis, lumbar region without neurogenic claudication: Secondary | ICD-10-CM | POA: Diagnosis not present

## 2016-07-30 DIAGNOSIS — M545 Low back pain: Secondary | ICD-10-CM | POA: Diagnosis not present

## 2016-07-30 DIAGNOSIS — Z8546 Personal history of malignant neoplasm of prostate: Secondary | ICD-10-CM | POA: Diagnosis not present

## 2016-07-30 DIAGNOSIS — M961 Postlaminectomy syndrome, not elsewhere classified: Secondary | ICD-10-CM | POA: Diagnosis not present

## 2016-07-30 DIAGNOSIS — T85840A Pain due to nervous system prosthetic devices, implants and grafts, initial encounter: Secondary | ICD-10-CM | POA: Diagnosis not present

## 2016-07-30 DIAGNOSIS — I714 Abdominal aortic aneurysm, without rupture: Secondary | ICD-10-CM | POA: Diagnosis not present

## 2016-07-30 DIAGNOSIS — F17210 Nicotine dependence, cigarettes, uncomplicated: Secondary | ICD-10-CM | POA: Diagnosis not present

## 2016-07-30 HISTORY — PX: LUMBAR SPINE HARDWARE REMOVAL: SHX1987

## 2016-08-03 ENCOUNTER — Encounter: Payer: Self-pay | Admitting: Family Medicine

## 2016-08-09 ENCOUNTER — Ambulatory Visit: Payer: PPO | Admitting: Family Medicine

## 2016-08-11 ENCOUNTER — Ambulatory Visit (INDEPENDENT_AMBULATORY_CARE_PROVIDER_SITE_OTHER): Payer: PPO | Admitting: Physician Assistant

## 2016-08-11 DIAGNOSIS — N3000 Acute cystitis without hematuria: Secondary | ICD-10-CM | POA: Diagnosis not present

## 2016-08-11 DIAGNOSIS — R300 Dysuria: Secondary | ICD-10-CM | POA: Diagnosis not present

## 2016-08-11 MED ORDER — SULFAMETHOXAZOLE-TRIMETHOPRIM 800-160 MG PO TABS: 1.0000 | ORAL_TABLET | Freq: Two times a day (BID) | ORAL | 0 refills | Status: DC

## 2016-08-13 NOTE — Progress Notes (Signed)
Subjective:     Patient ID: Justin Harrington, male   DOB: [birth year], 71 y.o.   MRN: [id number]  HPI Justin Harrington is a 71 yo male that comes today complaining of dysuria for about 2 weeks. He reports burning with urination and having a lot of lower abdominal pressure. He did recently undergo back surgery on 07/30/16 and had a catheter for the procedure. Symptoms have been present since the day following surgery. He has been pushing fluids. He started AZO yesterday and reports he has noticed improvement in symptoms some.   Review of Systems  Constitutional: Negative.   Respiratory: Negative.   Cardiovascular: Negative.   Gastrointestinal: Negative.   Genitourinary: Positive for dysuria and frequency. Negative for discharge, flank pain, hematuria, penile pain, penile swelling, scrotal swelling and urgency.  Musculoskeletal: Negative for back pain.       Objective:   Physical Exam  Constitutional: He is oriented to person, place, and time. He appears well-developed and well-nourished. No distress.  Cardiovascular: Normal rate, regular rhythm and normal heart sounds.  Exam reveals no gallop and no friction rub.   No murmur heard. Pulmonary/Chest: Effort normal and breath sounds normal. No respiratory distress. He has no wheezes. He has no rales.  Abdominal: Soft. Normal appearance and bowel sounds are normal. He exhibits no distension and no mass. There is no hepatosplenomegaly. There is no tenderness. There is no rebound, no guarding and no CVA tenderness.  Neurological: He is alert and oriented to person, place, and time.  Skin: Skin is warm and dry. He is not diaphoretic.  Vitals reviewed.      Assessment:     1. Acute cystitis without hematuria   2. Dysuria       Plan:     POCT UA was unable to be read due to discoloration from AZO tabs. I will send for culture as below. I will treat empirically with Bactrim as below. He is to continue to push fluids. May continue AZO tabs as  well for discomfort. I will f/u pending results of culture and adjust treatment if needed. He is to call if no improvement or symptoms worsen.  - sulfamethoxazole-trimethoprim (BACTRIM DS,SEPTRA DS) 800-160 MG tablet; Take 1 tablet by mouth 2 (two) times daily.  Dispense: 14 tablet; Refill: 0 - Urine Culture - POCT Urinalysis Dipstick

## 2016-08-15 LAB — URINE CULTURE

## 2016-08-18 ENCOUNTER — Telehealth: Payer: Self-pay

## 2016-08-18 NOTE — Telephone Encounter (Signed)
Patient advised as below. Pt reports he is feeling better

## 2016-08-18 NOTE — Telephone Encounter (Signed)
-----   Message from Mar Daring, [place] sent at 08/16/2016  9:13 AM EDT ----- Urine culture was positive and was susceptible to the antibiotic I prescribed. Complete antibiotic. If symptoms remain please call the office.

## 2016-08-19 ENCOUNTER — Encounter: Payer: PPO | Admitting: Nurse Practitioner

## 2016-08-19 ENCOUNTER — Ambulatory Visit: Payer: PPO | Admitting: Pain Medicine

## 2016-08-24 DIAGNOSIS — M961 Postlaminectomy syndrome, not elsewhere classified: Secondary | ICD-10-CM | POA: Diagnosis not present

## 2016-10-12 ENCOUNTER — Encounter: Payer: Self-pay | Admitting: Family Medicine

## 2016-10-12 ENCOUNTER — Ambulatory Visit (INDEPENDENT_AMBULATORY_CARE_PROVIDER_SITE_OTHER): Payer: PPO | Admitting: Family Medicine

## 2016-10-12 VITALS — BP 144/70 | HR 76 | Temp 98.4°F | Resp 16 | Ht 73.0 in | Wt 182.0 lb

## 2016-10-12 DIAGNOSIS — G8929 Other chronic pain: Secondary | ICD-10-CM | POA: Diagnosis not present

## 2016-10-12 DIAGNOSIS — Z23 Encounter for immunization: Secondary | ICD-10-CM

## 2016-10-12 DIAGNOSIS — M5441 Lumbago with sciatica, right side: Secondary | ICD-10-CM | POA: Diagnosis not present

## 2016-10-12 DIAGNOSIS — M4696 Unspecified inflammatory spondylopathy, lumbar region: Secondary | ICD-10-CM

## 2016-10-12 DIAGNOSIS — M47816 Spondylosis without myelopathy or radiculopathy, lumbar region: Secondary | ICD-10-CM | POA: Diagnosis not present

## 2016-10-12 DIAGNOSIS — M5442 Lumbago with sciatica, left side: Secondary | ICD-10-CM

## 2016-10-12 MED ORDER — OXYCODONE-ACETAMINOPHEN 10-325 MG PO TABS: 1.0000 | ORAL_TABLET | Freq: Four times a day (QID) | ORAL | 0 refills | Status: DC

## 2016-10-12 NOTE — Progress Notes (Signed)
Patient: Justin Harrington Male    DOB: 1945-02-20   71 y.o.   MRN: [id number] Visit Date: 10/12/2016  Today's Provider: [name], MD   No chief complaint on file.  Subjective:    Patient is here to discuss back issues. Patient also wants to discuss his pain medicastion.   Had hardware removal lumbar spine in June. Felt better for a few weeks after surgery, but since pain has gotten a little worse again. Next follow up with surgery is in about 2 weeks. Continues to have persistent and daily pain in lower back. Was prescribed 5mg  four times oxycodone after surgery. Was previously taking 30mg  four times a day prior to surgery. He would like to go back up to 20mg  four times a day.   Past Surgical History:  Procedure Laterality Date  . BACK SURGERY  1998   Lumbar spine fusion  . carotid doppler ultrasound  10/05/2011   75-90% RCA occlusion, 50% on left  . CAROTID ENDARTERECTOMY Right 12/02/2011   Dr. Lucky Cowboy  . CHOLECYSTECTOMY  10/02/2010   Laparoscopic, Dr. Pat Patrick, [facility]  . 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 Cholelithiasis and acute cholecystiis. Mild enlargement of left adrenal gland  . LUMBAR SPINE HARDWARE REMOVAL  07/30/2016   [name], MD  . PROSTATE SURGERY  10/19/2012   prostatectomy. [facility]; Laser assisted, Done by [name] for prostate cancer       No Known Allergies   Current Outpatient Prescriptions:  .  aspirin EC 81 MG tablet, Take 1 tablet by mouth daily., Disp: , Rfl:  .  atorvastatin (LIPITOR) 80 MG tablet, TAKE 1 TABLET(80 MG) BY MOUTH DAILY, Disp: 90 tablet, Rfl: 4 .  hydrochlorothiazide (HYDRODIURIL) 25 MG tablet, TAKE 1 TABLET(25 MG) BY MOUTH DAILY, Disp: 90 tablet, Rfl: 4 .  lubiprostone (AMITIZA) 24 MCG capsule, Take 24 mcg by mouth 2 (two) times daily with a meal., Disp: , Rfl:  .  oxyCODONE (OXY IR/ROXICODONE) 5 MG immediate release tablet, Take 5 mg  by mouth every 6 (six) hours., Disp: , Rfl:  .  traZODone (DESYREL) 150 MG tablet, TAKE 1 TABLET(150 MG) BY MOUTH AT BEDTIME AS NEEDED, Disp: 90 tablet, Rfl: 4 .  Wheat Dextrin (BENEFIBER) POWD, Stir 2 tsp. TID into 4-8 oz of any non-carbonated beverage or soft food (hot or cold), Disp: 500 g, Rfl: PRN  Review of Systems  Constitutional: Negative for appetite change, chills and fever.  Respiratory: Negative for chest tightness, shortness of breath and wheezing.   Cardiovascular: Negative for chest pain and palpitations.  Gastrointestinal: Negative for abdominal pain, nausea and vomiting.    Social History  Substance Use Topics  . Smoking status: Current Every Day Smoker    Packs/day: 0.75    Years: 50.00    Types: Cigarettes  . Smokeless tobacco: Never Used  . Alcohol use No   Objective:   BP (!) 144/70 (BP Location: Right Arm, Patient Position: Sitting, Cuff Size: Normal)   Pulse 76   Temp 98.4 F (36.9 C) (Oral)   Resp 16   Ht 6\' 1"  (1.854 m)   Wt 182 lb (82.6 kg)   SpO2 98%   BMI 24.01 kg/m  Vitals:   10/12/16 1643  BP: (!) 144/70  Pulse: 76  Resp: 16  Temp: 98.4 F (36.9 C)  TempSrc: Oral  SpO2: 98%  Weight: 182 lb (82.6 kg)  Height: 6\' 1"  (1.854 m)     Physical Exam  General appearance: alert, well developed, well nourished, cooperative and in no distress Head: Normocephalic, without obvious abnormality, atraumatic Respiratory: Respirations even and unlabored, normal respiratory rate Extremities: No gross deformities MS: Mild diffuse tenderness lumbar spine, well healed surgical scars, no gross deformities.      Assessment & Plan:     1. Chronic low back pain (Location of Primary Source of Pain) (Bilateral) (R>L) Has drastically reduced opioid use since surgery, but having more pain. He requests increast to 20mg  oxycodone. Will change from 5mg  oxycodone to  - oxyCODONE-acetaminophen (PERCOCET) 10-325 MG tablet; Take 1 tablet by mouth 4 (four) times  daily.  Dispense: 120 tablet; Refill: 0  2. Lumbar facet syndrome (Bilateral) (R>L)   3. Lumbar spondylosis   4. Need for influenza vaccination  - Flu vaccine HIGH DOSE PF  Is to follow up in a month        [name], MD  [facility]

## 2016-11-22 ENCOUNTER — Other Ambulatory Visit: Payer: Self-pay | Admitting: Family Medicine

## 2016-11-22 DIAGNOSIS — M5441 Lumbago with sciatica, right side: Principal | ICD-10-CM

## 2016-11-22 DIAGNOSIS — G8929 Other chronic pain: Secondary | ICD-10-CM

## 2016-11-22 DIAGNOSIS — M5442 Lumbago with sciatica, left side: Principal | ICD-10-CM

## 2016-11-22 MED ORDER — OXYCODONE-ACETAMINOPHEN 10-325 MG PO TABS: 1.0000 | ORAL_TABLET | Freq: Four times a day (QID) | ORAL | 0 refills | Status: DC

## 2016-11-22 NOTE — Telephone Encounter (Signed)
Pt contacted office for refill request on the following medications:  oxyCODONE-acetaminophen (PERCOCET) 10-325 MG tablet  Pt is requesting this changed to 10MG  only due to the cost.    Pt states he rec'd a Rx for Movantik 25MG  from Dr Lollie Sails where he had surgery.  Pt is asking if he can get the generic sent in due to the cost.    [phone]

## 2016-11-22 NOTE — Telephone Encounter (Signed)
oxycodone/apap prescription has been printed. There are no generics medications similar to Movantik.

## 2016-11-22 NOTE — Telephone Encounter (Signed)
LMOVM for pt to return call 

## 2016-11-22 NOTE — Telephone Encounter (Signed)
Please advise 

## 2016-11-23 NOTE — Telephone Encounter (Signed)
Pt returned call. Pt was advised. Thanks TNP

## 2016-11-23 NOTE — Telephone Encounter (Signed)
Patient was notified.

## 2016-12-01 ENCOUNTER — Other Ambulatory Visit: Payer: Self-pay | Admitting: Family Medicine

## 2016-12-01 MED ORDER — OXYCODONE HCL 10 MG PO TABS: 10.0000 mg | ORAL_TABLET | Freq: Four times a day (QID) | ORAL | 0 refills | Status: DC

## 2016-12-01 NOTE — Progress Notes (Signed)
Patient request change to oxycodone 10mg  withOUT APAP due to cost

## 2016-12-15 DIAGNOSIS — H2511 Age-related nuclear cataract, right eye: Secondary | ICD-10-CM | POA: Diagnosis not present

## 2016-12-17 ENCOUNTER — Encounter (INDEPENDENT_AMBULATORY_CARE_PROVIDER_SITE_OTHER): Payer: PPO

## 2016-12-17 ENCOUNTER — Ambulatory Visit (INDEPENDENT_AMBULATORY_CARE_PROVIDER_SITE_OTHER): Payer: PPO | Admitting: Vascular Surgery

## 2016-12-21 ENCOUNTER — Ambulatory Visit (INDEPENDENT_AMBULATORY_CARE_PROVIDER_SITE_OTHER): Payer: PPO | Admitting: Vascular Surgery

## 2016-12-21 ENCOUNTER — Other Ambulatory Visit (INDEPENDENT_AMBULATORY_CARE_PROVIDER_SITE_OTHER): Payer: PPO

## 2017-01-17 ENCOUNTER — Ambulatory Visit (INDEPENDENT_AMBULATORY_CARE_PROVIDER_SITE_OTHER): Payer: PPO | Admitting: Vascular Surgery

## 2017-01-17 ENCOUNTER — Ambulatory Visit (INDEPENDENT_AMBULATORY_CARE_PROVIDER_SITE_OTHER): Payer: PPO

## 2017-01-17 ENCOUNTER — Encounter (INDEPENDENT_AMBULATORY_CARE_PROVIDER_SITE_OTHER): Payer: Self-pay

## 2017-01-17 DIAGNOSIS — I6523 Occlusion and stenosis of bilateral carotid arteries: Secondary | ICD-10-CM | POA: Diagnosis not present

## 2017-01-19 ENCOUNTER — Ambulatory Visit (INDEPENDENT_AMBULATORY_CARE_PROVIDER_SITE_OTHER): Payer: PPO | Admitting: Vascular Surgery

## 2017-01-19 ENCOUNTER — Ambulatory Visit (INDEPENDENT_AMBULATORY_CARE_PROVIDER_SITE_OTHER): Payer: PPO

## 2017-01-19 ENCOUNTER — Encounter (INDEPENDENT_AMBULATORY_CARE_PROVIDER_SITE_OTHER): Payer: Self-pay | Admitting: Vascular Surgery

## 2017-01-19 VITALS — BP 126/75 | HR 70 | Resp 15 | Ht 73.0 in | Wt 182.0 lb

## 2017-01-19 DIAGNOSIS — I714 Abdominal aortic aneurysm, without rupture, unspecified: Secondary | ICD-10-CM

## 2017-01-19 DIAGNOSIS — E7800 Pure hypercholesterolemia, unspecified: Secondary | ICD-10-CM | POA: Diagnosis not present

## 2017-01-19 DIAGNOSIS — I6523 Occlusion and stenosis of bilateral carotid arteries: Secondary | ICD-10-CM | POA: Diagnosis not present

## 2017-01-19 NOTE — Progress Notes (Signed)
Subjective:    Patient ID: Justin Harrington, male    DOB: 1945/02/16, 71 y.o.   MRN: [id number] Chief Complaint  Patient presents with  . AAA    AAA 6 month f/u   Patient presents to review multiple vascular studies.  The patient was seen approximately one year ago for carotid artery stenosis surveillance and approximately six months ago for an abdominal aortic aneurysm.  The patient presents today without complaint.  The patient underwent a bilateral carotid exam which was notable for a patent right carotid endarterectomy site with no significant right carotid artery stenosis.  Significant soft plaque formation/wall thickening is noted at the right distal common carotid artery however no hemodynamically significant velocity increase was obtained.  No hemodynamically significant 1-39% stenosis of the left proximal internal carotid artery.  When compared to the previous exam on December 17, 2015 there has been no significant change.  The patient underwent an abdominal aortic aneurysm duplex exam the patient's aortic aneurysm measured 4.0 cm x 4.4 cm.  Previous exam on 06/18/2016 his aneurysm measured 3.92 cm x 4.47 cm.  The patient denies any amaurosis fugax or neurological symptoms/deficits.  The patient denies any abdominal pain, back pain or thrombosis of the bilateral lower extremity.  Patient denies any fever, nausea or vomiting.   Review of Systems  Constitutional: Negative.   HENT: Negative.   Eyes: Negative.   Respiratory: Negative.   Cardiovascular: Negative.   Gastrointestinal: Negative.   Endocrine: Negative.   Genitourinary: Negative.   Musculoskeletal: Negative.   Skin: Negative.   Allergic/Immunologic: Negative.   Neurological: Negative.   Hematological: Negative.   Psychiatric/Behavioral: Negative.       Objective:   Physical Exam  Constitutional: He is oriented to person, place, and time. He appears well-developed and well-nourished. No distress.  HENT:  Head:  Normocephalic and atraumatic.  Eyes: Conjunctivae are normal. Pupils are equal, round, and reactive to light.  Neck: Normal range of motion.  No carotid bruit noted  Cardiovascular: Normal rate, regular rhythm, normal heart sounds and intact distal pulses.  Pulses:      Radial pulses are 2+ on the right side, and 2+ on the left side.       Dorsalis pedis pulses are 2+ on the right side, and 2+ on the left side.       Posterior tibial pulses are 2+ on the right side, and 2+ on the left side.  Pulmonary/Chest: Effort normal and breath sounds normal.  Abdominal: Soft. Bowel sounds are normal. He exhibits no distension. There is no tenderness. There is no rebound.  Musculoskeletal: Normal range of motion. He exhibits no edema.  Neurological: He is alert and oriented to person, place, and time.  Skin: Skin is warm and dry. He is not diaphoretic.  Psychiatric: He has a normal mood and affect. His behavior is normal. Judgment and thought content normal.  Vitals reviewed.  BP 126/75 (BP Location: Right Arm, Patient Position: Sitting)   Pulse 70   Resp 15   Ht 6\' 1"  (1.854 m)   Wt 182 lb (82.6 kg)   BMI 24.01 kg/m   Past Medical History:  Diagnosis Date  . Personal history of tobacco use, presenting hazards to health 06/12/2015  . Personal history of tobacco use, presenting hazards to health 06/12/2015  . Prostate cancer [facility])    Social History   Socioeconomic History  . Marital status: Married    Spouse name: Not on file  . Number of  children: Not on file  . Years of education: HS Diploma  . Highest education level: Not on file  Social Needs  . Financial resource strain: Not on file  . Food insecurity - worry: Not on file  . Food insecurity - inability: Not on file  . Transportation needs - medical: Not on file  . Transportation needs - non-medical: Not on file  Occupational History  . Occupation: Retired  Tobacco Use  . Smoking status: Current Every Day Smoker    Packs/day:  0.75    Years: 50.00    Pack years: 37.50    Types: Cigarettes  . Smokeless tobacco: Never Used  Substance and Sexual Activity  . Alcohol use: No    Alcohol/week: 0.0 oz  . Drug use: No  . Sexual activity: Not on file  Other Topics Concern  . Not on file  Social History Narrative  . Not on file   Past Surgical History:  Procedure Laterality Date  . BACK SURGERY  1998   Lumbar spine fusion  . carotid doppler ultrasound  10/05/2011   75-90% RCA occlusion, 50% on left  . CAROTID ENDARTERECTOMY Right 12/02/2011   Dr. Lucky Cowboy  . CHOLECYSTECTOMY  10/02/2010   Laparoscopic, Dr. Pat Patrick, [facility]  . 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 Cholelithiasis and acute cholecystiis. Mild enlargement of left adrenal gland  . LUMBAR SPINE HARDWARE REMOVAL  07/30/2016   [name], MD  . PROSTATE SURGERY  10/19/2012   prostatectomy. [facility]; Laser assisted, Done by [name] for prostate cancer   Family History  Problem Relation Age of Onset  . Heart attack Mother   . Congestive Heart Failure Father   . Cerebral palsy Son    Allergies  Allergen Reactions  . Zolpidem     Other reaction(s): Other (See Comments) GOT UP IN MIDDLE OF NIGHT AND FILLED ALL THE GLASSES WITH FLOUR      Assessment & Plan:  Patient presents to review multiple vascular studies.  The patient was seen approximately one year ago for carotid artery stenosis surveillance and approximately six months ago for an abdominal aortic aneurysm.  The patient presents today without complaint.  The patient underwent a bilateral carotid exam which was notable for a patent right carotid endarterectomy site with no significant right carotid artery stenosis.  Significant soft plaque formation/wall thickening is noted at the right distal common carotid artery however no hemodynamically significant velocity increase was obtained.  No hemodynamically significant  1-39% stenosis of the left proximal internal carotid artery.  When compared to the previous exam on December 17, 2015 there has been no significant change.  The patient underwent an abdominal aortic aneurysm duplex exam the patient's aortic aneurysm measured 4.0 cm x 4.4 cm.  Previous exam on 06/18/2016 his aneurysm measured 3.92 cm x 4.47 cm.  The patient denies any amaurosis fugax or neurological symptoms/deficits.  The patient denies any abdominal pain, back pain or thrombosis of the bilateral lower extremity.  Patient denies any fever, nausea or vomiting.  1. Bilateral carotid artery stenosis - Stable Studies reviewed with patient. Patient asymptomatic with stable duplex. No intervention at this time. Patient to return in one year for surveillance carotid duplex. Patient to remain abstinent of tobacco use. I have discussed with the patient at length the risk factors for and pathogenesis of atherosclerotic disease and encouraged a healthy diet, regular exercise regimen and blood pressure / glucose  control.  Patient was instructed to contact our office in the interim with problems such as arm / leg weakness or numbness, speech / swallowing difficulty or temporary monocular blindness. The patient expresses their understanding.  - VAS US CAROTID; Future  2. AAA (abdominal aortic aneurysm) without rupture ([place]) Studies reviewed with patient. Duplex stable, physical exam unremarkable. No surgery or intervention at this time. The patient to follow up in 6 months with an aortic duplex. The patient has an asymptomatic abdominal aortic aneurysm that is greater than 4 cm in maximal diameter.  I have reviewed the natural history of abdominal aortic aneurysm and the small risk of rupture for aneurysm less than 5 cm in size.  However, as these small aneurysms tend to enlarge over time, continued surveillance with ultrasound or CT scan is mandatory.  The patient's blood pressure is being adequately controlled  however I have reviewed the importance of hypertension and lipid control and the importance of continuing his abstinence from tobacco.  The patient is also encouraged to exercise a minimum of 30 minutes 4 times a week.  Should the patient develop new onset abdominal or back pain or signs of peripheral embolization they are instructed to seek medical attention immediately and to alert the physician providing care that they have an aneurysm.  The patient voices their understanding.  - VAS [place] AAA DUPLEX; Future  3. Hypercholesteremia - Stable Encouraged good control as its slows the progression of atherosclerotic disease  Current Outpatient Medications on File Prior to Visit  Medication Sig Dispense Refill  . aspirin EC 81 MG tablet Take 1 tablet by mouth daily.    Marland Kitchen atorvastatin (LIPITOR) 80 MG tablet TAKE 1 TABLET(80 MG) BY MOUTH DAILY 90 tablet 4  . DOXYLAMINE SUCCINATE PO Take by mouth.    . hydrochlorothiazide (HYDRODIURIL) 25 MG tablet TAKE 1 TABLET(25 MG) BY MOUTH DAILY 90 tablet 4  . Oxycodone HCl 10 MG TABS Take 1 tablet (10 mg total) by mouth 4 (four) times daily. 120 tablet 0  . oxyCODONE-acetaminophen (PERCOCET) 10-325 MG tablet TK 1 T PO QID  0  . SENNOSIDES PO Take by mouth.    . traZODone (DESYREL) 150 MG tablet TAKE 1 TABLET(150 MG) BY MOUTH AT BEDTIME AS NEEDED 90 tablet 4  . Wheat Dextrin (BENEFIBER) POWD Stir 2 tsp. TID into 4-8 oz of any non-carbonated beverage or soft food (hot or cold) 500 g PRN  . gabapentin (NEURONTIN) 100 MG capsule TK 1 TO 3 CS PO QID PRN. FOLLOW WRITTEN TITRATION SCHEDULE    . lubiprostone (AMITIZA) 24 MCG capsule Take 24 mcg by mouth 2 (two) times daily with a meal.     No current facility-administered medications on file prior to visit.    There are no Patient Instructions on file for this visit. No Follow-up on file.  Onix Jumper A Tennyson Wacha, PA-C

## 2017-02-22 ENCOUNTER — Other Ambulatory Visit: Payer: Self-pay | Admitting: Family Medicine

## 2017-02-22 MED ORDER — OXYCODONE-ACETAMINOPHEN 10-325 MG PO TABS: ORAL_TABLET | ORAL | 0 refills | Status: DC

## 2017-02-22 NOTE — Telephone Encounter (Signed)
Patient is needing a refill on his Oxycodone 10/325.  He said last time he got multiple Rx so he wouldn't have to come back every month.   He uses [facility] in [place]. Let patient know when this has been done.

## 2017-03-01 ENCOUNTER — Other Ambulatory Visit: Payer: Self-pay | Admitting: Family Medicine

## 2017-03-02 MED ORDER — OXYCODONE-ACETAMINOPHEN 10-325 MG PO TABS: 1.0000 | ORAL_TABLET | Freq: Four times a day (QID) | ORAL | 0 refills | Status: DC | PRN

## 2017-03-02 NOTE — Telephone Encounter (Signed)
Patient is wanting a refill on the Oxycodone/Acetaminophen 10/325 mg.   Sent to [facility] in [place]

## 2017-03-31 ENCOUNTER — Other Ambulatory Visit: Payer: Self-pay | Admitting: Family Medicine

## 2017-03-31 NOTE — Telephone Encounter (Signed)
Medicine refill is suppose to be Oxycodone not oxycotin

## 2017-03-31 NOTE — Telephone Encounter (Signed)
Pt. Needs refills on Oxycontin 10 mg. Called to [facility]

## 2017-04-01 NOTE — Telephone Encounter (Signed)
Please review. Thanks!  

## 2017-04-01 NOTE — Telephone Encounter (Signed)
Tried calling pt to verify which oxycodone he wants filled? No answer. Will try again later.

## 2017-04-01 NOTE — Telephone Encounter (Signed)
LMOVM for pt to return call 

## 2017-04-01 NOTE — Telephone Encounter (Signed)
Pt returned called. Pt stated that he would rather have the Oxycodone HCl 10 MG TABS because they are much cheaper than the oxyCODONE-acetaminophen (PERCOCET) 10-325 MG tablet and the acetaminophen doesn't really help any more than the regular Oxycodone.    Oxycodone HCl 10 MG TABS  [facility] [place]  Please advise. Thanks TNP

## 2017-04-03 MED ORDER — OXYCODONE HCL 10 MG PO TABS: 10.0000 mg | ORAL_TABLET | Freq: Four times a day (QID) | ORAL | 0 refills | Status: DC

## 2017-04-26 ENCOUNTER — Ambulatory Visit (INDEPENDENT_AMBULATORY_CARE_PROVIDER_SITE_OTHER): Payer: PPO

## 2017-04-26 ENCOUNTER — Ambulatory Visit (INDEPENDENT_AMBULATORY_CARE_PROVIDER_SITE_OTHER): Payer: PPO | Admitting: Family Medicine

## 2017-04-26 ENCOUNTER — Telehealth: Payer: Self-pay | Admitting: Family Medicine

## 2017-04-26 VITALS — BP 134/72 | HR 80 | Temp 97.7°F | Ht 73.0 in | Wt 179.8 lb

## 2017-04-26 DIAGNOSIS — M5442 Lumbago with sciatica, left side: Secondary | ICD-10-CM | POA: Diagnosis not present

## 2017-04-26 DIAGNOSIS — E7800 Pure hypercholesterolemia, unspecified: Secondary | ICD-10-CM | POA: Diagnosis not present

## 2017-04-26 DIAGNOSIS — I10 Essential (primary) hypertension: Secondary | ICD-10-CM

## 2017-04-26 DIAGNOSIS — Z8546 Personal history of malignant neoplasm of prostate: Secondary | ICD-10-CM

## 2017-04-26 DIAGNOSIS — Z125 Encounter for screening for malignant neoplasm of prostate: Secondary | ICD-10-CM

## 2017-04-26 DIAGNOSIS — Z1211 Encounter for screening for malignant neoplasm of colon: Secondary | ICD-10-CM

## 2017-04-26 DIAGNOSIS — G8929 Other chronic pain: Secondary | ICD-10-CM

## 2017-04-26 DIAGNOSIS — Z0000 Encounter for general adult medical examination without abnormal findings: Secondary | ICD-10-CM | POA: Diagnosis not present

## 2017-04-26 DIAGNOSIS — M5441 Lumbago with sciatica, right side: Secondary | ICD-10-CM

## 2017-04-26 DIAGNOSIS — F17210 Nicotine dependence, cigarettes, uncomplicated: Secondary | ICD-10-CM

## 2017-04-26 NOTE — Patient Instructions (Signed)
   The CDC recommends two doses of Shingrix (the shingles vaccine) separated by 2 to 6 months for adults age 71 years and older. I recommend checking with your insurance plan regarding coverage for this vaccine.   

## 2017-04-26 NOTE — Patient Instructions (Addendum)
Justin Harrington , Thank you for taking time to come for your Medicare Wellness Visit. I appreciate your ongoing commitment to your health goals. Please review the following plan we discussed and let me know if I can assist you in the future.   Screening recommendations/referrals: Colonoscopy: Pt states he completed the cologuard in 2018. Results not seen on file. Order needs to be placed today.  Recommended yearly ophthalmology/optometry visit for glaucoma screening and checkup Recommended yearly dental visit for hygiene and checkup  Vaccinations: Influenza vaccine: Up to date Pneumococcal vaccine: Pneumovax 23 due 2020. Tdap vaccine: Pt declines today.  Shingles vaccine: Pt declines today.   Advanced directives: Please bring a copy of your POA (Power of Attorney) and/or Living Will to your next appointment.   Conditions/risks identified: Recommend to start drinking 3 glasses of water a day.   Next appointment: 10:00 AM today with Dr Caryn Section.   Preventive Care 17 Years and Older, Male Preventive care refers to lifestyle choices and visits with your health care provider that can promote health and wellness. What does preventive care include?  A yearly physical exam. This is also called an annual well check.  Dental exams once or twice a year.  Routine eye exams. Ask your health care provider how often you should have your eyes checked.  Personal lifestyle choices, including:  Daily care of your teeth and gums.  Regular physical activity.  Eating a healthy diet.  Avoiding tobacco and drug use.  Limiting alcohol use.  Practicing safe sex.  Taking low doses of aspirin every day.  Taking vitamin and mineral supplements as recommended by your health care provider. What happens during an annual well check? The services and screenings done by your health care provider during your annual well check will depend on your age, overall health, lifestyle risk factors, and family history  of disease. Counseling  Your health care provider may ask you questions about your:  Alcohol use.  Tobacco use.  Drug use.  Emotional well-being.  Home and relationship well-being.  Sexual activity.  Eating habits.  History of falls.  Memory and ability to understand (cognition).  Work and work Statistician. Screening  You may have the following tests or measurements:  Height, weight, and BMI.  Blood pressure.  Lipid and cholesterol levels. These may be checked every 5 years, or more frequently if you are over 46 years old.  Skin check.  Lung cancer screening. You may have this screening every year starting at age 43 if you have a 30-pack-year history of smoking and currently smoke or have quit within the past 15 years.  Fecal occult blood test (FOBT) of the stool. You may have this test every year starting at age 45.  Flexible sigmoidoscopy or colonoscopy. You may have a sigmoidoscopy every 5 years or a colonoscopy every 10 years starting at age 44.  Prostate cancer screening. Recommendations will vary depending on your family history and other risks.  Hepatitis C blood test.  Hepatitis B blood test.  Sexually transmitted disease (STD) testing.  Diabetes screening. This is done by checking your blood sugar (glucose) after you have not eaten for a while (fasting). You may have this done every 1-3 years.  Abdominal aortic aneurysm (AAA) screening. You may need this if you are a current or former smoker.  Osteoporosis. You may be screened starting at age 38 if you are at high risk. Talk with your health care provider about your test results, treatment options, and if necessary,  the need for more tests. Vaccines  Your health care provider may recommend certain vaccines, such as:  Influenza vaccine. This is recommended every year.  Tetanus, diphtheria, and acellular pertussis (Tdap, Td) vaccine. You may need a Td booster every 10 years.  Zoster vaccine. You may  need this after age 90.  Pneumococcal 13-valent conjugate (PCV13) vaccine. One dose is recommended after age 66.  Pneumococcal polysaccharide (PPSV23) vaccine. One dose is recommended after age 12. Talk to your health care provider about which screenings and vaccines you need and how often you need them. This information is not intended to replace advice given to you by your health care provider. Make sure you discuss any questions you have with your health care provider. Document Released: 02/14/2015 Document Revised: 10/08/2015 Document Reviewed: 11/19/2014 Elsevier Interactive Patient Education  2017 [place] Prevention in the Home Falls can cause injuries. They can happen to people of all ages. There are many things you can do to make your home safe and to help prevent falls. What can I do on the outside of my home?  Regularly fix the edges of walkways and driveways and fix any cracks.  Remove anything that might make you trip as you walk through a door, such as a raised step or threshold.  Trim any bushes or trees on the path to your home.  Use bright outdoor lighting.  Clear any walking paths of anything that might make someone trip, such as rocks or tools.  Regularly check to see if handrails are loose or broken. Make sure that both sides of any steps have handrails.  Any raised decks and porches should have guardrails on the edges.  Have any leaves, snow, or ice cleared regularly.  Use sand or salt on walking paths during winter.  Clean up any spills in your garage right away. This includes oil or grease spills. What can I do in the bathroom?  Use night lights.  Install grab bars by the toilet and in the tub and shower. Do not use towel bars as grab bars.  Use non-skid mats or decals in the tub or shower.  If you need to sit down in the shower, use a plastic, non-slip stool.  Keep the floor dry. Clean up any water that spills on the floor as soon as it  happens.  Remove soap buildup in the tub or shower regularly.  Attach bath mats securely with double-sided non-slip rug tape.  Do not have throw rugs and other things on the floor that can make you trip. What can I do in the bedroom?  Use night lights.  Make sure that you have a light by your bed that is easy to reach.  Do not use any sheets or blankets that are too big for your bed. They should not hang down onto the floor.  Have a firm chair that has side arms. You can use this for support while you get dressed.  Do not have throw rugs and other things on the floor that can make you trip. What can I do in the kitchen?  Clean up any spills right away.  Avoid walking on wet floors.  Keep items that you use a lot in easy-to-reach places.  If you need to reach something above you, use a strong step stool that has a grab bar.  Keep electrical cords out of the way.  Do not use floor polish or wax that makes floors slippery. If you must use  wax, use non-skid floor wax.  Do not have throw rugs and other things on the floor that can make you trip. What can I do with my stairs?  Do not leave any items on the stairs.  Make sure that there are handrails on both sides of the stairs and use them. Fix handrails that are broken or loose. Make sure that handrails are as long as the stairways.  Check any carpeting to make sure that it is firmly attached to the stairs. Fix any carpet that is loose or worn.  Avoid having throw rugs at the top or bottom of the stairs. If you do have throw rugs, attach them to the floor with carpet tape.  Make sure that you have a light switch at the top of the stairs and the bottom of the stairs. If you do not have them, ask someone to add them for you. What else can I do to help prevent falls?  Wear shoes that:  Do not have high heels.  Have rubber bottoms.  Are comfortable and fit you well.  Are closed at the toe. Do not wear sandals.  If you  use a stepladder:  Make sure that it is fully opened. Do not climb a closed stepladder.  Make sure that both sides of the stepladder are locked into place.  Ask someone to hold it for you, if possible.  Clearly mark and make sure that you can see:  Any grab bars or handrails.  First and last steps.  Where the edge of each step is.  Use tools that help you move around (mobility aids) if they are needed. These include:  Canes.  Walkers.  Scooters.  Crutches.  Turn on the lights when you go into a dark area. Replace any light bulbs as soon as they burn out.  Set up your furniture so you have a clear path. Avoid moving your furniture around.  If any of your floors are uneven, fix them.  If there are any pets around you, be aware of where they are.  Review your medicines with your doctor. Some medicines can make you feel dizzy. This can increase your chance of falling. Ask your doctor what other things that you can do to help prevent falls. This information is not intended to replace advice given to you by your health care provider. Make sure you discuss any questions you have with your health care provider. Document Released: 11/14/2008 Document Revised: 06/26/2015 Document Reviewed: 02/22/2014 Elsevier Interactive Patient Education  2017 [employer].

## 2017-04-26 NOTE — Progress Notes (Signed)
Subjective:   Justin Harrington is a 72 y.o. male who presents for Medicare Annual/Subsequent preventive examination.  Review of Systems:  N/A  Cardiac Risk Factors include: advanced age (>51men, >21 women);dyslipidemia;male gender;hypertension;smoking/ tobacco exposure     Objective:    Vitals: BP 134/72 (BP Location: Left Arm)   Pulse 80   Temp 97.7 F (36.5 C) (Oral)   Ht 6\' 1"  (1.854 m)   Wt 179 lb 12.8 oz (81.6 kg)   BMI 23.72 kg/m   Body mass index is 23.72 kg/m.  Advanced Directives 04/26/2017 06/18/2016 06/07/2016 06/03/2016 04/22/2016 03/30/2016 03/03/2016  Does Patient Have a Medical Advance Directive? Yes Yes Yes Yes Yes Yes Yes  Type of Paramedic of [place];Living will [place];Living will [place];Living will - Healthcare Power of [place] will -  Copy of [place] in Chart? No - copy requested - - - No - copy requested - -    Tobacco Social History   Tobacco Use  Smoking Status Current Every Day Smoker  . Packs/day: 0.75  . Years: 50.00  . Pack years: 37.50  . Types: Cigarettes  Smokeless Tobacco Never Used     Ready to quit: No Counseling given: No   Clinical Intake:  Pre-visit preparation completed: Yes  Pain : 0-10 Pain Score: 4  Pain Type: Chronic pain Pain Location: Back Pain Orientation: Lower Pain Descriptors / Indicators: Sore Pain Frequency: Constant     Nutritional Status: BMI of 19-24  Normal Nutritional Risks: None Diabetes: No  How often do you need to have someone help you when you read instructions, pamphlets, or other written materials from your doctor or pharmacy?: 1 - Never  Interpreter Needed?: No  Information entered by :: [facility], LPN  Past Medical History:  Diagnosis Date  . Personal history of tobacco use, presenting hazards to health 06/12/2015  . Personal history of tobacco use, presenting hazards to health 06/12/2015  .  Prostate cancer [facility])    Past Surgical History:  Procedure Laterality Date  . BACK SURGERY  1998   Lumbar spine fusion  . carotid doppler ultrasound  10/05/2011   75-90% RCA occlusion, 50% on left  . CAROTID ENDARTERECTOMY Right 12/02/2011   Dr. Lucky Cowboy  . CHOLECYSTECTOMY  10/02/2010   Laparoscopic, Dr. Pat Patrick, [facility]  . 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 Cholelithiasis and acute cholecystiis. Mild enlargement of left adrenal gland  . LUMBAR SPINE HARDWARE REMOVAL  07/30/2016   [name], MD  . PROSTATE SURGERY  10/19/2012   prostatectomy. [facility]; Laser assisted, Done by [name] for prostate cancer   Family History  Problem Relation Age of Onset  . Heart attack Mother   . Congestive Heart Failure Father   . Cerebral palsy Son    Social History   Socioeconomic History  . Marital status: Married    Spouse name: Not on file  . Number of children: 2  . Years of education: HS Diploma  . Highest education level: 12th grade  Occupational History  . Occupation: Retired  Scientific laboratory technician  . Financial resource strain: Not hard at all  . Food insecurity:    Worry: Never true    Inability: Never true  . Transportation needs:    Medical: No    Non-medical: No  Tobacco Use  . Smoking status: Current Every Day Smoker  Packs/day: 0.75    Years: 50.00    Pack years: 37.50    Types: Cigarettes  . Smokeless tobacco: Never Used  Substance and Sexual Activity  . Alcohol use: No    Alcohol/week: 0.0 oz  . Drug use: No  . Sexual activity: Not on file  Lifestyle  . Physical activity:    Days per week: Not on file    Minutes per session: Not on file  . Stress: Not at all  Relationships  . Social connections:    Talks on phone: Not on file    Gets together: Not on file    Attends religious service: Not on file    Active member of club or organization: Not on file    Attends meetings of  clubs or organizations: Not on file    Relationship status: Not on file  Other Topics Concern  . Not on file  Social History Narrative   Son passed at age 80 from cerebral palsy.    Outpatient Encounter Medications as of 04/26/2017  Medication Sig  . aspirin EC 81 MG tablet Take 1 tablet by mouth daily.  Marland Kitchen atorvastatin (LIPITOR) 80 MG tablet TAKE 1 TABLET(80 MG) BY MOUTH DAILY  . DOXYLAMINE SUCCINATE PO Take 2 tablets by mouth at bedtime.   . hydrochlorothiazide (HYDRODIURIL) 25 MG tablet TAKE 1 TABLET(25 MG) BY MOUTH DAILY  . Oxycodone HCl 10 MG TABS Take 1 tablet (10 mg total) by mouth 4 (four) times daily.  . SENNOSIDES PO Take by mouth as needed.   . traZODone (DESYREL) 150 MG tablet TAKE 1 TABLET(150 MG) BY MOUTH AT BEDTIME AS NEEDED  . Wheat Dextrin (BENEFIBER) POWD Stir 2 tsp. TID into 4-8 oz of any non-carbonated beverage or soft food (hot or cold)  . gabapentin (NEURONTIN) 100 MG capsule TK 1 TO 3 CS PO QID PRN. FOLLOW WRITTEN TITRATION SCHEDULE  . lubiprostone (AMITIZA) 24 MCG capsule Take 24 mcg by mouth 2 (two) times daily with a meal.   No facility-administered encounter medications on file as of 04/26/2017.     Activities of Daily Living In your present state of health, do you have any difficulty performing the following activities: 04/26/2017  Hearing? N  Vision? N  Difficulty concentrating or making decisions? N  Walking or climbing stairs? Y  Comment Due to due back pain.   Dressing or bathing? N  Doing errands, shopping? N  Preparing Food and eating ? N  Using the Toilet? N  In the past six months, have you accidently leaked urine? Y  Comment Due to hx of prostate cancer, had prostate removed.   Do you have problems with loss of bowel control? N  Managing your Medications? N  Managing your Finances? N  Housekeeping or managing your Housekeeping? N  Some recent data might be hidden    Patient Care Team: [name], MD as PCP - General (Family  Medicine) Murrell Redden, MD (Urology) Lucky Cowboy Erskine Squibb, MD as Referring Physician (Vascular Surgery) [place], [place] as Consulting Physician (Optometry)   Assessment:   This is a routine wellness examination for Justin Harrington.  Exercise Activities and Dietary recommendations Current Exercise Habits: The patient does not participate in regular exercise at present(Does a lot of work around the home. ), Exercise limited by: None identified  Goals    . DIET - INCREASE WATER INTAKE     Recommend to start drinking 3 glasses of water a day.  Fall Risk Fall Risk  04/26/2017 06/07/2016 06/03/2016 04/22/2016 03/30/2016  Falls in the past year? No No No Yes Yes  Number falls in past yr: - - - 1 1  Injury with Fall? - - - Yes Yes  Risk Factor Category  - - - High Fall Risk High Fall Risk  Risk for fall due to : - - - - History of fall(s)  Follow up - - - Falls prevention discussed;Education provided Falls prevention discussed  Comment - - - - fell while putting eyes drops in eyes   Is the patient's home free of loose throw rugs in walkways, pet beds, electrical cords, etc?   yes      Grab bars in the bathroom? yes      Handrails on the stairs?   yes      Adequate lighting?   yes  Timed Get Up and Go Performed: N/A  Depression Screen PHQ 2/9 Scores 04/26/2017 06/07/2016 06/03/2016 04/22/2016  PHQ - 2 Score 0 0 0 0  Exception Documentation - - - -    Cognitive Function: Pt declined screening today.         Immunization History  Administered Date(s) Administered  . Influenza, High Dose Seasonal PF 10/12/2016  . Influenza-Unspecified 12/01/2014  . Pneumococcal Conjugate-13 05/08/2013  . Pneumococcal Polysaccharide-23 10/02/2010    Qualifies for Shingles Vaccine? Due for Shingles vaccine. Declined my offer to administer today. Education has been provided regarding the importance of this vaccine. Pt has been advised to call her insurance company to determine her out of pocket expense. Advised  she may also receive this vaccine at her local pharmacy or Health Dept. Verbalized acceptance and understanding.  Screening Tests Health Maintenance  Topic Date Due  . TETANUS/TDAP  09/08/1965  . COLONOSCOPY  09/08/1996  . PNA vac Low Risk Adult (2 of 2 - PPSV23) 10/02/2015  . INFLUENZA VACCINE  Completed  . Hepatitis C Screening  Completed   Cancer Screenings: Lung: Low Dose CT Chest recommended if Age 24-80 years, 30 pack-year currently smoking OR have quit w/in 15years. Patient does qualify, however pt completed this 06/2016. Colorectal: Pt states he completed the cologuard in 2018. Results not seen on file. Called lab and there was no order received for a cologuard. Will need an order placed today.  Additional Screenings:  Hepatitis C Screening: Up to date    Plan:  I have personally reviewed and addressed the Medicare Annual Wellness questionnaire and have noted the following in the patient's chart:  A. Medical and social history B. Use of alcohol, tobacco or illicit drugs  C. Current medications and supplements D. Functional ability and status E.  Nutritional status F.  Physical activity G. Advance directives H. List of other physicians I.  Hospitalizations, surgeries, and ER visits in previous 12 months J.  [place] such as hearing and vision if needed, cognitive and depression L. Referrals and appointments - none  In addition, I have reviewed and discussed with patient certain preventive protocols, quality metrics, and best practice recommendations. A written personalized care plan for preventive services as well as general preventive health recommendations were provided to patient.  See attached scanned questionnaire for additional information.   Signed,  Fabio Neighbors, LPN Nurse Health Advisor   Nurse Recommendations: Pt declined the tetanus vaccine today. Order for cologuard placed last year was not completed. Needs a new order per lab.

## 2017-04-26 NOTE — Telephone Encounter (Signed)
Order for cologuard faxed to [facility] °

## 2017-04-26 NOTE — Progress Notes (Signed)
Patient: Justin Harrington Male    DOB: [birth year]   72 y.o.   MRN: [id number] Visit Date: 04/26/2017  Today's Provider: [name], MD   Chief Complaint  Patient presents with  . Hypertension    follow up  . Coronary Artery Disease    follow up  . AAA    follow up  . Hyperlipidemia    follow up  . Back Pain    follow up   Subjective:     Patient saw [facility] for AWV this morning at 9:00 am.  HPI  Hypertension, follow-up:  BP Readings from Last 3 Encounters:  01/19/17 126/75  10/12/16 (!) 144/70  07/02/16 136/62    He was last seen for hypertension 9 months ago.  BP at that visit was 136/62. Management since that visit includes; no changes.He reports good compliance with treatment. He is not having side effects.  He is exercising. He is not adherent to low salt diet.   Outside blood pressures are rarely checked. He is experiencing none.  Patient denies chest pain, chest pressure/discomfort, claudication, dyspnea, exertional chest pressure/discomfort, fatigue, irregular heart beat, lower extremity edema, near-syncope, orthopnea, palpitations, paroxysmal nocturnal dyspnea, syncope and tachypnea.   Cardiovascular risk factors include advanced age (older than 31 for men, 60 for women), dyslipidemia and hypertension.  Use of agents associated with hypertension: NSAIDS.   ------------------------------------------------------------------------    Lipid/Cholesterol, Follow-up:   Last seen for this 03/15/2016.  Management since that visit includes; labs checked, no changes.  Last Lipid Panel:    Component Value Date/Time   CHOL 140 03/15/2016 1127   TRIG 129 03/15/2016 1127   HDL 49 03/15/2016 1127   CHOLHDL 2.9 03/15/2016 1127   LDLCALC 65 03/15/2016 1127    He reports good compliance with treatment. He is not having side effects.   Wt Readings from Last 3 Encounters:  01/19/17 182 lb (82.6 kg)  10/12/16 182 lb (82.6 kg)  07/02/16 181 lb (82.1  kg)    ------------------------------------------------------------------------  AAA (abdominal aortic aneurysm) without rupture ([place]) From 07/02/2016-Followed by Dr. Lucky Cowboy. Patient reports good compliance with treatment.   Coronary artery disease involving native coronary artery of native heart without angina pectoris  From 07/02/2016-referred to Cardiology.  Chronic low back pain (Location of Primary Source of Pain) (Bilateral) (R>L) From 10/12/2016-changed from 5mg  oxycodone to oxyCODONE-acetaminophen (PERCOCET) 10-325 MG tablet.Patient reports good compliance with treatment, good tolerance and fair symptom control. Patient says his back pain is unchanged since last office visit.       Allergies  Allergen Reactions  . Zolpidem     Other reaction(s): Other (See Comments) GOT UP IN MIDDLE OF NIGHT AND FILLED ALL THE GLASSES WITH FLOUR     Current Outpatient Medications:  .  aspirin EC 81 MG tablet, Take 1 tablet by mouth daily., Disp: , Rfl:  .  atorvastatin (LIPITOR) 80 MG tablet, TAKE 1 TABLET(80 MG) BY MOUTH DAILY, Disp: 90 tablet, Rfl: 4 .  DOXYLAMINE SUCCINATE PO, Take 2 tablets by mouth at bedtime. , Disp: , Rfl:  .  gabapentin (NEURONTIN) 100 MG capsule, TK 1 TO 3 CS PO QID PRN. FOLLOW WRITTEN TITRATION SCHEDULE, Disp: , Rfl:  .  hydrochlorothiazide (HYDRODIURIL) 25 MG tablet, TAKE 1 TABLET(25 MG) BY MOUTH DAILY, Disp: 90 tablet, Rfl: 4 .  lubiprostone (AMITIZA) 24 MCG capsule, Take 24 mcg by mouth 2 (two) times daily with a meal., Disp: , Rfl:  .  Oxycodone HCl 10  MG TABS, Take 1 tablet (10 mg total) by mouth 4 (four) times daily., Disp: 120 tablet, Rfl: 0 .  SENNOSIDES PO, Take by mouth as needed. , Disp: , Rfl:  .  traZODone (DESYREL) 150 MG tablet, TAKE 1 TABLET(150 MG) BY MOUTH AT BEDTIME AS NEEDED, Disp: 90 tablet, Rfl: 4 .  Wheat Dextrin (BENEFIBER) POWD, Stir 2 tsp. TID into 4-8 oz of any non-carbonated beverage or soft food (hot or cold), Disp: 500 g, Rfl:  PRN  Review of Systems  Constitutional: Negative for appetite change, chills and fever.  Respiratory: Negative for chest tightness, shortness of breath and wheezing.   Cardiovascular: Negative for chest pain and palpitations.  Gastrointestinal: Negative for abdominal pain, nausea and vomiting.    Social History   Tobacco Use  . Smoking status: Current Every Day Smoker    Packs/day: 0.75    Years: 50.00    Pack years: 37.50    Types: Cigarettes  . Smokeless tobacco: Never Used  Substance Use Topics  . Alcohol use: No    Alcohol/week: 0.0 oz   Objective:       Vitals:   BP 134/72 (BP Location: Left Arm)    Pulse 80    Temp 97.7 F (36.5 C) (Oral)    Ht 6\' 1"  (1.854 m)    Wt 179 lb 12.8 oz (81.6 kg)    BMI 23.72 kg/m    BSA 2.05 m         Physical Exam   General Appearance:    Alert, cooperative, no distress, appears stated age  Head:    Normocephalic, without obvious abnormality, atraumatic  Eyes:    PERRL, conjunctiva/corneas clear, EOM's intact, fundi    benign, both eyes       Ears:    Normal TM's and external ear canals, both ears  Nose:   Nares normal, septum midline, mucosa normal, no drainage   or sinus tenderness  Throat:   Lips, mucosa, and tongue normal; teeth and gums normal  Neck:   Supple, symmetrical, trachea midline, no adenopathy;       thyroid:  No enlargement/tenderness/nodules; no carotid   bruit or JVD  Back:     Symmetric, no curvature, ROM normal, no CVA tenderness  Lungs:     Clear to auscultation bilaterally, respirations unlabored  Chest wall:    No tenderness or deformity  Heart:    Regular rate and rhythm, S1 and S2 normal, no murmur, rub   or gallop  Abdomen:     Soft, non-tender, bowel sounds active all four quadrants,    no masses, no organomegaly  Genitalia:    deferred  Rectal:    deferred  Extremities:   Extremities normal, atraumatic, no cyanosis or edema  Pulses:   2+ and symmetric all extremities  Skin:   Skin  color, texture, turgor normal, no rashes or lesions  Lymph nodes:   Cervical, supraclavicular, and axillary nodes normal  Neurologic:   CNII-XII intact. Normal strength, sensation and reflexes      throughout       Assessment & Plan:     1. Essential hypertension Well controlled.  Continue current medications.    2. Hypercholesteremia He is tolerating atorvastatin well with no adverse effects.   - Comprehensive metabolic panel - Lipid panel - CBC  3. Prostate cancer screening  - PSA  4. Colon cancer screening  - Cologuard  5. Chronic low back pain (Location of Primary Source of Pain) (Bilateral) (  R>L) Doing well current pain medication regiment.   6. History of prostate cancer Continue follow up Dr. Jacqlyn Larsen  7. Smoking greater than 30 pack years LDCT done may 2018       [name], MD  [facility]

## 2017-04-27 ENCOUNTER — Telehealth: Payer: Self-pay

## 2017-04-27 LAB — CBC
HEMATOCRIT: 45.2 % (ref 37.5–51.0)
HEMOGLOBIN: 15.7 g/dL (ref 13.0–17.7)
MCH: 32 pg (ref 26.6–33.0)
MCHC: 34.7 g/dL (ref 31.5–35.7)
MCV: 92 fL (ref 79–97)
Platelets: 220 10*3/uL (ref 150–379)
RBC: 4.91 x10E6/uL (ref 4.14–5.80)
RDW: 13.4 % (ref 12.3–15.4)
WBC: 10.5 10*3/uL (ref 3.4–10.8)

## 2017-04-27 LAB — COMPREHENSIVE METABOLIC PANEL
ALT: 22 IU/L (ref 0–44)
AST: 29 IU/L (ref 0–40)
Albumin/Globulin Ratio: 1.7 (ref 1.2–2.2)
Albumin: 4.5 g/dL (ref 3.5–4.8)
Alkaline Phosphatase: 71 IU/L (ref 39–117)
BUN/Creatinine Ratio: 12 (ref 10–24)
BUN: 14 mg/dL (ref 8–27)
Bilirubin Total: 0.4 mg/dL (ref 0.0–1.2)
CALCIUM: 9.7 mg/dL (ref 8.6–10.2)
CO2: 29 mmol/L (ref 20–29)
Chloride: 98 mmol/L (ref 96–106)
Creatinine, Ser: 1.17 mg/dL (ref 0.76–1.27)
GFR, EST AFRICAN AMERICAN: 73 mL/min/{1.73_m2} (ref >59)
GFR, EST NON AFRICAN AMERICAN: 63 mL/min/{1.73_m2} (ref >59)
GLOBULIN, TOTAL: 2.7 g/dL (ref 1.5–4.5)
Glucose: 100 mg/dL — ABNORMAL HIGH (ref 65–99)
POTASSIUM: 3.8 mmol/L (ref 3.5–5.2)
SODIUM: 144 mmol/L (ref 134–144)
Total Protein: 7.2 g/dL (ref 6.0–8.5)

## 2017-04-27 LAB — LIPID PANEL
CHOL/HDL RATIO: 2.4 ratio (ref 0.0–5.0)
Cholesterol, Total: 115 mg/dL (ref 100–199)
HDL: 48 mg/dL (ref >39)
LDL Calculated: 51 mg/dL (ref 0–99)
TRIGLYCERIDES: 79 mg/dL (ref 0–149)
VLDL Cholesterol Cal: 16 mg/dL (ref 5–40)

## 2017-04-27 LAB — PSA

## 2017-04-27 NOTE — Telephone Encounter (Signed)
Tried calling patient on home and moblie phone. Left message to call back.

## 2017-04-27 NOTE — Telephone Encounter (Signed)
-----   Message from [name], MD sent at 04/27/2017  8:06 AM EDT ----- PSA, blood sugar, kidney functions, electrolytes and cholesterol are all normal. Check labs yearly.

## 2017-04-28 NOTE — Telephone Encounter (Signed)
Left message to call back  

## 2017-04-29 NOTE — Telephone Encounter (Signed)
Pt advised.

## 2017-05-03 ENCOUNTER — Other Ambulatory Visit: Payer: Self-pay | Admitting: Family Medicine

## 2017-05-03 MED ORDER — OXYCODONE HCL 10 MG PO TABS: 10.0000 mg | ORAL_TABLET | Freq: Four times a day (QID) | ORAL | 0 refills | Status: DC

## 2017-05-03 NOTE — Telephone Encounter (Signed)
Patient is requesting a refill on the following medication  Oxycodone HCl 10 MG TABS  He uses [facility] in [place]

## 2017-05-16 DIAGNOSIS — Z1211 Encounter for screening for malignant neoplasm of colon: Secondary | ICD-10-CM | POA: Diagnosis not present

## 2017-05-18 LAB — COLOGUARD: Cologuard: POSITIVE

## 2017-05-30 ENCOUNTER — Other Ambulatory Visit: Payer: Self-pay | Admitting: Family Medicine

## 2017-05-30 MED ORDER — OXYCODONE HCL 10 MG PO TABS: 10.0000 mg | ORAL_TABLET | Freq: Four times a day (QID) | ORAL | 0 refills | Status: DC

## 2017-05-30 NOTE — Telephone Encounter (Signed)
pt needs refill on his oxycodone 10mg  4 times a day   [facility] in [place]

## 2017-06-02 ENCOUNTER — Telehealth: Payer: Self-pay | Admitting: *Deleted

## 2017-06-02 DIAGNOSIS — R195 Other fecal abnormalities: Secondary | ICD-10-CM

## 2017-06-02 NOTE — Telephone Encounter (Signed)
-----   Message from [name], MD sent at 06/02/2017  8:02 AM EDT ----- Cologuard is positive. Patients needs referral to GI for further evaluation

## 2017-06-02 NOTE — Telephone Encounter (Signed)
Patient was notified of results. Expressed understanding. Referral to GI in epic.

## 2017-06-02 NOTE — Telephone Encounter (Signed)
Please schedule GI referral? Thanks!

## 2017-06-06 ENCOUNTER — Telehealth: Payer: Self-pay

## 2017-06-06 NOTE — Telephone Encounter (Signed)
Gastroenterology Pre-Procedure Review  Request Date: 06/20/17  Requesting Physician: Dr.  Allen Norris    PATIENT REVIEW QUESTIONS: The patient responded to the following health history questions as indicated:    1. Are you having any GI issues? No  2. Do you have a personal history of Polyps? Yes  3. Do you have a family history of Colon Cancer or Polyps? No  4. Diabetes Mellitus? No  5. Joint replacements in the past 12 months? No  6. Major health problems in the past 3 months? No  7. Any artificial heart valves, MVP, or defibrillator? No     MEDICATIONS & ALLERGIES:    Patient reports the following regarding taking any anticoagulation/antiplatelet therapy:   Plavix, Coumadin, Eliquis, Xarelto, Lovenox, Pradaxa, Brilinta, or Effient? No  Aspirin? Yes, 81 mg   Patient confirms/reports the following medications:  Current Outpatient Medications  Medication Sig Dispense Refill  . aspirin EC 81 MG tablet Take 1 tablet by mouth daily.    Marland Kitchen atorvastatin (LIPITOR) 80 MG tablet TAKE 1 TABLET(80 MG) BY MOUTH DAILY 90 tablet 4  . hydrochlorothiazide (HYDRODIURIL) 25 MG tablet TAKE 1 TABLET(25 MG) BY MOUTH DAILY 90 tablet 4  . Oxycodone HCl 10 MG TABS Take 1 tablet (10 mg total) by mouth 4 (four) times daily. 120 tablet 0  . traZODone (DESYREL) 150 MG tablet TAKE 1 TABLET(150 MG) BY MOUTH AT BEDTIME AS NEEDED 90 tablet 4  . Wheat Dextrin (BENEFIBER) POWD Stir 2 tsp. TID into 4-8 oz of any non-carbonated beverage or soft food (hot or cold) 500 g PRN   No current facility-administered medications for this visit.     Patient confirms/reports the following allergies:  Allergies  Allergen Reactions  . Zolpidem     Other reaction(s): Other (See Comments) GOT UP IN MIDDLE OF NIGHT AND FILLED ALL THE GLASSES WITH FLOUR    No orders of the defined types were placed in this encounter.   AUTHORIZATION INFORMATION Primary Insurance: 1D#: Group #:  Secondary Insurance: 1D#: Group #:  SCHEDULE  INFORMATION: Date: 06/20/17 Time: Location: [place]

## 2017-06-08 ENCOUNTER — Other Ambulatory Visit: Payer: Self-pay

## 2017-06-08 DIAGNOSIS — R195 Other fecal abnormalities: Secondary | ICD-10-CM

## 2017-06-15 ENCOUNTER — Other Ambulatory Visit: Payer: Self-pay

## 2017-06-16 NOTE — Discharge Instructions (Signed)
General Anesthesia, Adult, Care After °These instructions provide you with information about caring for yourself after your procedure. Your health care provider may also give you more specific instructions. Your treatment has been planned according to current medical practices, but problems sometimes occur. Call your health care provider if you have any problems or questions after your procedure. °What can I expect after the procedure? °After the procedure, it is common to have: °· Vomiting. °· A sore throat. °· Mental slowness. ° °It is common to feel: °· Nauseous. °· Cold or shivery. °· Sleepy. °· Tired. °· Sore or achy, even in parts of your body where you did not have surgery. ° °Follow these instructions at home: °For at least 24 hours after the procedure: °· Do not: °? Participate in activities where you could fall or become injured. °? Drive. °? Use heavy machinery. °? Drink alcohol. °? Take sleeping pills or medicines that cause drowsiness. °? Make important decisions or sign legal documents. °? Take care of children on your own. °· Rest. °Eating and drinking °· If you vomit, drink water, juice, or soup when you can drink without vomiting. °· Drink enough fluid to keep your urine clear or pale yellow. °· Make sure you have little or no nausea before eating solid foods. °· Follow the diet recommended by your health care provider. °General instructions °· Have a responsible adult stay with you until you are awake and alert. °· Return to your normal activities as told by your health care provider. Ask your health care provider what activities are safe for you. °· Take over-the-counter and prescription medicines only as told by your health care provider. °· If you smoke, do not smoke without supervision. °· Keep all follow-up visits as told by your health care provider. This is important. °Contact a health care provider if: °· You continue to have nausea or vomiting at home, and medicines are not helpful. °· You  cannot drink fluids or start eating again. °· You cannot urinate after 8-12 hours. °· You develop a skin rash. °· You have fever. °· You have increasing redness at the site of your procedure. °Get help right away if: °· You have difficulty breathing. °· You have chest pain. °· You have unexpected bleeding. °· You feel that you are having a life-threatening or urgent problem. °This information is not intended to replace advice given to you by your health care provider. Make sure you discuss any questions you have with your health care provider. °Document Released: 04/26/2000 Document Revised: 06/23/2015 Document Reviewed: 01/02/2015 °Elsevier Interactive Patient Education © 2018 [employer] Inc[employer] ° °

## 2017-06-20 ENCOUNTER — Ambulatory Visit
Admission: RE | Admit: 2017-06-20 | Discharge: 2017-06-20 | Disposition: A | Discharge status: Home / Self Care | Payer: PPO | Source: Ambulatory Visit | Attending: Gastroenterology | Admitting: Gastroenterology

## 2017-06-20 ENCOUNTER — Ambulatory Visit: Payer: PPO | Admitting: Anesthesiology

## 2017-06-20 ENCOUNTER — Encounter
Admission: RE | Disposition: A | Discharge status: Home / Self Care | Payer: Self-pay | Source: Ambulatory Visit | Attending: Gastroenterology

## 2017-06-20 DIAGNOSIS — Z86718 Personal history of other venous thrombosis and embolism: Secondary | ICD-10-CM | POA: Insufficient documentation

## 2017-06-20 DIAGNOSIS — K640 First degree hemorrhoids: Secondary | ICD-10-CM | POA: Insufficient documentation

## 2017-06-20 DIAGNOSIS — F17210 Nicotine dependence, cigarettes, uncomplicated: Secondary | ICD-10-CM | POA: Diagnosis not present

## 2017-06-20 DIAGNOSIS — R195 Other fecal abnormalities: Secondary | ICD-10-CM | POA: Diagnosis not present

## 2017-06-20 DIAGNOSIS — I2510 Atherosclerotic heart disease of native coronary artery without angina pectoris: Secondary | ICD-10-CM | POA: Diagnosis not present

## 2017-06-20 DIAGNOSIS — I739 Peripheral vascular disease, unspecified: Secondary | ICD-10-CM | POA: Insufficient documentation

## 2017-06-20 DIAGNOSIS — Z8546 Personal history of malignant neoplasm of prostate: Secondary | ICD-10-CM | POA: Diagnosis not present

## 2017-06-20 DIAGNOSIS — Z9049 Acquired absence of other specified parts of digestive tract: Secondary | ICD-10-CM | POA: Insufficient documentation

## 2017-06-20 DIAGNOSIS — Z7982 Long term (current) use of aspirin: Secondary | ICD-10-CM | POA: Insufficient documentation

## 2017-06-20 DIAGNOSIS — I10 Essential (primary) hypertension: Secondary | ICD-10-CM | POA: Insufficient documentation

## 2017-06-20 DIAGNOSIS — Z79899 Other long term (current) drug therapy: Secondary | ICD-10-CM | POA: Diagnosis not present

## 2017-06-20 HISTORY — DX: Acute embolism and thrombosis of unspecified deep veins of unspecified lower extremity: I82.409

## 2017-06-20 HISTORY — DX: Atherosclerotic heart disease of native coronary artery without angina pectoris: I25.10

## 2017-06-20 HISTORY — DX: Arthrodesis status: Z98.1

## 2017-06-20 HISTORY — DX: Presence of dental prosthetic device (complete) (partial): Z97.2

## 2017-06-20 HISTORY — DX: Essential (primary) hypertension: I10

## 2017-06-20 HISTORY — PX: COLONOSCOPY WITH PROPOFOL: SHX5780

## 2017-06-20 SURGERY — COLONOSCOPY WITH PROPOFOL
Anesthesia: General | Wound class: Contaminated

## 2017-06-20 MED ORDER — LACTATED RINGERS IV SOLN
INTRAVENOUS | Status: DC
  Administered 2017-06-20 (×2): via INTRAVENOUS

## 2017-06-20 MED ORDER — SODIUM CHLORIDE 0.9 % IV SOLN: INTRAVENOUS | Status: DC

## 2017-06-20 MED ORDER — LIDOCAINE HCL (CARDIAC) PF 100 MG/5ML IV SOSY
PREFILLED_SYRINGE | INTRAVENOUS | Status: DC | PRN
  Administered 2017-06-20: 20 mg via INTRAVENOUS

## 2017-06-20 MED ORDER — STERILE WATER FOR IRRIGATION IR SOLN
Status: DC | PRN
  Administered 2017-06-20: .5 mL

## 2017-06-20 MED ORDER — ACETAMINOPHEN 325 MG PO TABS: 325.0000 mg | ORAL_TABLET | Freq: Once | ORAL | Status: DC

## 2017-06-20 MED ORDER — PROPOFOL 10 MG/ML IV BOLUS
INTRAVENOUS | Status: DC | PRN
  Administered 2017-06-20: 60 mg via INTRAVENOUS
  Administered 2017-06-20: 20 mg via INTRAVENOUS
  Administered 2017-06-20: 30 mg via INTRAVENOUS
  Administered 2017-06-20: 10 mg via INTRAVENOUS
  Administered 2017-06-20 (×6): 20 mg via INTRAVENOUS
  Administered 2017-06-20: 10 mg via INTRAVENOUS

## 2017-06-20 MED ORDER — ACETAMINOPHEN 160 MG/5ML PO SOLN: 325.0000 mg | Freq: Once | ORAL | Status: DC

## 2017-06-20 SURGICAL SUPPLY — 24 items

## 2017-06-20 NOTE — Transfer of Care (Signed)
Immediate Anesthesia Transfer of Care Note  Patient: Justin Harrington  Procedure(s) Performed: COLONOSCOPY WITH PROPOFOL (N/A )  Patient Location: PACU  Anesthesia Type: General  Level of Consciousness: awake, alert  and patient cooperative  Airway and Oxygen Therapy: Patient Spontanous Breathing and Patient connected to supplemental oxygen  Post-op Assessment: Post-op Vital signs reviewed, Patient's Cardiovascular Status Stable, Respiratory Function Stable, Patent Airway and No signs of Nausea or vomiting  Post-op Vital Signs: Reviewed and stable  Complications: No apparent anesthesia complications

## 2017-06-20 NOTE — Anesthesia Postprocedure Evaluation (Signed)
Anesthesia Post Note  Patient: Justin Harrington  Procedure(s) Performed: COLONOSCOPY WITH PROPOFOL (N/A )  Patient location during evaluation: PACU Anesthesia Type: General Level of consciousness: awake and alert and oriented Pain management: satisfactory to patient Vital Signs Assessment: post-procedure vital signs reviewed and stable Respiratory status: spontaneous breathing, nonlabored ventilation and respiratory function stable Cardiovascular status: blood pressure returned to baseline and stable Postop Assessment: Adequate PO intake and No signs of nausea or vomiting Anesthetic complications: no    Raliegh Ip

## 2017-06-20 NOTE — H&P (Signed)
[name], MD [address] [place], [place] [address] [phone] Fax : [phone]  Primary Care Physician:  [name], MD Primary Gastroenterologist:  [name]  Pre-Procedure History & Physical: HPI:  Justin Harrington is a 72 y.o. male is here for an colonoscopy.   Past Medical History:  Diagnosis Date  . Aortic aneurysm ([place])   . Coronary artery disease   . DVT (deep venous thrombosis) ([place])    H/O DVT in left leg, filter placed approximatly 8 yrs ago  . H/O spinal fusion 1998   Back pain  . Hypertension    controlled on meds  . Personal history of tobacco use, presenting hazards to health 06/12/2015  . Personal history of tobacco use, presenting hazards to health 06/12/2015  . Prostate cancer [facility])    Had prostatectomy  . Wears dentures    upper    Past Surgical History:  Procedure Laterality Date  . BACK SURGERY  1998   Lumbar spine fusion  . carotid doppler ultrasound  10/05/2011   75-90% RCA occlusion, 50% on left  . CAROTID ENDARTERECTOMY Right 12/02/2011   Dr. Lucky Cowboy  . CHOLECYSTECTOMY  10/02/2010   Laparoscopic, Dr. Pat Patrick, [facility]  . 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 Cholelithiasis and acute cholecystiis. Mild enlargement of left adrenal gland  . LUMBAR SPINE HARDWARE REMOVAL  07/30/2016   [name], MD  . PROSTATE SURGERY  10/19/2012   prostatectomy. [facility]; Laser assisted, Done by [name] for prostate cancer    Prior to Admission medications   Medication Sig Start Date End Date Taking? Authorizing Provider  aspirin EC 81 MG tablet Take 1 tablet by mouth daily. pm   Yes [provider]  atorvastatin (LIPITOR) 80 MG tablet TAKE 1 TABLET(80 MG) BY MOUTH DAILY 06/17/16  Yes [name], MD  DOXYLAMINE SUCCINATE, SLEEP, PO Take by mouth. pm   Yes [provider]  hydrochlorothiazide (HYDRODIURIL) 25 MG tablet  TAKE 1 TABLET(25 MG) BY MOUTH DAILY 06/17/16  Yes [name], MD  Oxycodone HCl 10 MG TABS Take 1 tablet (10 mg total) by mouth 4 (four) times daily. 05/30/17  Yes [name], MD  traZODone (DESYREL) 150 MG tablet TAKE 1 TABLET(150 MG) BY MOUTH AT BEDTIME AS NEEDED 06/17/16  Yes Fisher, [name], MD  Wheat Dextrin (BENEFIBER) POWD Stir 2 tsp. TID into 4-8 oz of any non-carbonated beverage or soft food (hot or cold) 10/16/15  Yes [name], MD    Allergies as of 06/08/2017 - Review Complete 04/26/2017  Allergen Reaction Noted  . Zolpidem  07/21/2016    Family History  Problem Relation Age of Onset  . Heart attack Mother   . Congestive Heart Failure Father   . Cerebral palsy Son     Social History   Socioeconomic History  . Marital status: Married    Spouse name: Not on file  . Number of children: 2  . Years of education: HS Diploma  . Highest education level: 12th grade  Occupational History  . Occupation: Retired  Scientific laboratory technician  . Financial resource strain: Not hard at all  . Food insecurity:    Worry: Never true    Inability: Never true  . Transportation needs:    Medical: No    Non-medical: No  Tobacco Use  . Smoking status: Current Every Day Smoker    Packs/day: 0.75  Years: 50.00    Pack years: 37.50    Types: Cigarettes  . Smokeless tobacco: Never Used  Substance and Sexual Activity  . Alcohol use: No    Alcohol/week: 0.0 oz  . Drug use: No  . Sexual activity: Not on file  Lifestyle  . Physical activity:    Days per week: Not on file    Minutes per session: Not on file  . Stress: Not at all  Relationships  . Social connections:    Talks on phone: Not on file    Gets together: Not on file    Attends religious service: Not on file    Active member of club or organization: Not on file    Attends meetings of clubs or organizations: Not on file    Relationship status: Not on file  . Intimate partner violence:    Fear of current or ex  partner: Not on file    Emotionally abused: Not on file    Physically abused: Not on file    Forced sexual activity: Not on file  Other Topics Concern  . Not on file  Social History Narrative   Son passed at age 50 from cerebral palsy.    Review of Systems: See HPI, otherwise negative ROS  Physical Exam: BP (!) 144/85   Pulse 89   Temp 98.1 F (36.7 C) (Temporal)   Resp 16   Ht 6\' 1"  (1.854 m)   Wt 173 lb (78.5 kg)   SpO2 94%   BMI 22.82 kg/m  General:   Alert,  pleasant and cooperative in NAD Head:  Normocephalic and atraumatic. Neck:  Supple; no masses or thyromegaly. Lungs:  Clear throughout to auscultation.    Heart:  Regular rate and rhythm. Abdomen:  Soft, nontender and nondistended. Normal bowel sounds, without guarding, and without rebound.   Neurologic:  Alert and  oriented x4;  grossly normal neurologically.  Impression/Plan: [name] is here for an colonoscopy to be performed for positive cologuard  Risks, benefits, limitations, and alternatives regarding  colonoscopy have been reviewed with the patient.  Questions have been answered.  All parties agreeable.   [name], MD  06/20/2017, 8:52 AM

## 2017-06-20 NOTE — Anesthesia Procedure Notes (Signed)
Procedure Name: MAC Date/Time: 06/20/2017 9:35 AM Performed by: Lind Guest, CRNA Pre-anesthesia Checklist: Patient identified, Emergency Drugs available, Suction available, Patient being monitored and Timeout performed Patient Re-evaluated:Patient Re-evaluated prior to induction Oxygen Delivery Method: Nasal cannula

## 2017-06-20 NOTE — Op Note (Signed)
[facility] Gastroenterology Patient Name: Justin Harrington Procedure Date: 06/20/2017 9:19 AM MRN: [id number] Account #: [id number] Date of Birth: 03-14-1945 Admit Type: Outpatient Age: 72 Room: [facility] OR ROOM 01 Gender: Male Note Status: Finalized Procedure:            Colonoscopy Indications:          Positive Cologuard test Providers:            [name] MD, MD Referring MD:         [name]. Caryn Section, MD (Referring MD) Medicines:            Propofol per Anesthesia Complications:        No immediate complications. Procedure:            Pre-Anesthesia Assessment:                       - Prior to the procedure, a History and Physical was                        performed, and patient medications and allergies were                        reviewed. The patient's tolerance of previous                        anesthesia was also reviewed. The risks and benefits of                        the procedure and the sedation options and risks were                        discussed with the patient. All questions were                        answered, and informed consent was obtained. Prior                        Anticoagulants: The patient has taken no previous                        anticoagulant or antiplatelet agents. ASA Grade                        Assessment: II - A patient with mild systemic disease.                        After reviewing the risks and benefits, the patient was                        deemed in satisfactory condition to undergo the                        procedure.                       After obtaining informed consent, the colonoscope was                        passed under direct vision. Throughout the procedure,  the patient's blood pressure, pulse, and oxygen                        saturations were monitored continuously. The [place] [id number]) was introduced through the    anus and advanced to the the cecum, identified by                        appendiceal orifice and ileocecal valve. The                        colonoscopy was performed without difficulty. The                        patient tolerated the procedure well. The quality of                        the bowel preparation was excellent. Findings:      The perianal and digital rectal examinations were normal.      Non-bleeding internal hemorrhoids were found during retroflexion. The       hemorrhoids were Grade I (internal hemorrhoids that do not prolapse). Impression:           - Non-bleeding internal hemorrhoids.                       - No specimens collected. Recommendation:       - Discharge patient to home.                       - Resume previous diet.                       - Continue present medications.                       - Repeat colonoscopy in 10 years for screening unless                        any change in family history or lower GI problems. Procedure Code(s):    --- Professional ---                       [id number], Colonoscopy, flexible; diagnostic, including                        collection of specimen(s) by brushing or washing, when                        performed (separate procedure) Diagnosis Code(s):    --- Professional ---                       R19.5, Other fecal abnormalities CPT copyright 2017 American Medical Association. All rights reserved. The codes documented in this report are preliminary and upon coder review may  be revised to meet current compliance requirements. [name] MD, MD 06/20/2017 9:51:34 AM This report has been signed electronically. Number of Addenda: 0 Note Initiated On: 06/20/2017 9:19 AM Scope Withdrawal Time: 0 hours 10 minutes 16 seconds  Total Procedure Duration: 0 hours  15 minutes 51 seconds       [facility]

## 2017-06-20 NOTE — Anesthesia Preprocedure Evaluation (Signed)
Anesthesia Evaluation  Patient identified by MRN, date of birth, ID band Patient awake    Reviewed: Allergy & Precautions, H&P , NPO status , Patient's Chart, lab work & pertinent test results  Airway Mallampati: I  TM Distance: >3 FB Neck ROM: full    Dental  (+) Upper Dentures, Lower Dentures   Pulmonary Current Smoker,    Pulmonary exam normal breath sounds clear to auscultation       Cardiovascular hypertension, + CAD and + Peripheral Vascular Disease  Normal cardiovascular exam Rhythm:regular Rate:Normal     Neuro/Psych  Neuromuscular disease    GI/Hepatic   Endo/Other    Renal/GU      Musculoskeletal   Abdominal   Peds  Hematology   Anesthesia Other Findings   Reproductive/Obstetrics                             Anesthesia Physical Anesthesia Plan  ASA: III  Anesthesia Plan: General   Post-op Pain Management:    Induction: Intravenous  PONV Risk Score and Plan: 2 and Propofol infusion and Treatment may vary due to age or medical condition  Airway Management Planned: Natural Airway  Additional Equipment:   Intra-op Plan:   Post-operative Plan:   Informed Consent: I have reviewed the patients History and Physical, chart, labs and discussed the procedure including the risks, benefits and alternatives for the proposed anesthesia with the patient or authorized representative who has indicated his/her understanding and acceptance.     Plan Discussed with: CRNA  Anesthesia Plan Comments:         Anesthesia Quick Evaluation

## 2017-06-22 ENCOUNTER — Encounter: Payer: Self-pay | Admitting: Gastroenterology

## 2017-06-29 ENCOUNTER — Other Ambulatory Visit: Payer: Self-pay | Admitting: Family Medicine

## 2017-06-29 MED ORDER — OXYCODONE HCL 10 MG PO TABS: 10.0000 mg | ORAL_TABLET | Freq: Four times a day (QID) | ORAL | 0 refills | Status: DC

## 2017-06-29 NOTE — Telephone Encounter (Signed)
Patient needs Oxycodone 10 mg. Sent to [facility] in [place]

## 2017-07-20 ENCOUNTER — Ambulatory Visit (INDEPENDENT_AMBULATORY_CARE_PROVIDER_SITE_OTHER): Payer: PPO | Admitting: Vascular Surgery

## 2017-07-20 ENCOUNTER — Encounter (INDEPENDENT_AMBULATORY_CARE_PROVIDER_SITE_OTHER): Payer: Self-pay | Admitting: Vascular Surgery

## 2017-07-20 ENCOUNTER — Ambulatory Visit (INDEPENDENT_AMBULATORY_CARE_PROVIDER_SITE_OTHER): Payer: PPO

## 2017-07-20 VITALS — BP 135/75 | HR 66 | Ht 73.0 in | Wt 173.2 lb

## 2017-07-20 DIAGNOSIS — I6523 Occlusion and stenosis of bilateral carotid arteries: Secondary | ICD-10-CM

## 2017-07-20 DIAGNOSIS — E7800 Pure hypercholesterolemia, unspecified: Secondary | ICD-10-CM

## 2017-07-20 DIAGNOSIS — I714 Abdominal aortic aneurysm, without rupture, unspecified: Secondary | ICD-10-CM

## 2017-07-20 DIAGNOSIS — I10 Essential (primary) hypertension: Secondary | ICD-10-CM | POA: Diagnosis not present

## 2017-07-20 NOTE — Progress Notes (Signed)
Subjective:    Patient ID: Justin Harrington, male    DOB: 09/29/1945, 72 y.o.   MRN: [id number] Chief Complaint  Patient presents with  . Follow-up    had study done this morning    Patient presents for a six-month AAA follow-up.  Patient presents without complaint today patient denies any abdominal pain, back pain or thrombosis to the bilateral lower extremity.  The patient underwent a AAA duplex which was notable for a AAA measuring 4.3 cm x 4.5 cm, right common iliac artery measuring 1.6 cm x 1.5 cm and the left common iliac artery measuring 1.5 cm x 1.6 cm.  Biphasic waveforms throughout.  Previous diameter measurements taken on January 19, 2017 were notable for AAA measuring 4.4 cm x 4.0 cm.  Patient denies any claudication-like symptoms, rest pain or ulceration to the bilateral lower extremity.  Patient denies any fever, nausea vomiting.  Review of Systems  Constitutional: Negative.   HENT: Negative.   Eyes: Negative.   Respiratory: Negative.   Cardiovascular:       AAA Carotid Stenosis  Gastrointestinal: Negative.   Endocrine: Negative.   Genitourinary: Negative.   Musculoskeletal: Negative.   Skin: Negative.   Allergic/Immunologic: Negative.   Neurological: Negative.   Hematological: Negative.   Psychiatric/Behavioral: Negative.       Objective:   Physical Exam  Constitutional: He is oriented to person, place, and time. He appears well-developed and well-nourished. No distress.  HENT:  Head: Normocephalic and atraumatic.  Right Ear: External ear normal.  Left Ear: External ear normal.  Eyes: Pupils are equal, round, and reactive to light. Conjunctivae and EOM are normal.  Neck: Normal range of motion.  Cardiovascular: Normal rate, regular rhythm, normal heart sounds and intact distal pulses.  Pulses:      Radial pulses are 2+ on the right side, and 2+ on the left side.  No carotid bruits noted on exam  Pulmonary/Chest: Effort normal and breath sounds normal.    Abdominal: Soft. Bowel sounds are normal. He exhibits no distension. There is no tenderness. There is no guarding.  Musculoskeletal: Normal range of motion. He exhibits no edema.  Neurological: He is alert and oriented to person, place, and time.  Skin: Skin is warm and dry. He is not diaphoretic.  Psychiatric: He has a normal mood and affect. His behavior is normal. Judgment and thought content normal.  Vitals reviewed.  BP 135/75 (BP Location: Right Arm, Patient Position: Sitting)   Pulse 66   Ht 6\' 1"  (1.854 m)   Wt 173 lb 3.2 oz (78.6 kg)   BMI 22.85 kg/m   Past Medical History:  Diagnosis Date  . Aortic aneurysm ([place])   . Coronary artery disease   . DVT (deep venous thrombosis) ([place])    H/O DVT in left leg, filter placed approximatly 8 yrs ago  . H/O spinal fusion 1998   Back pain  . Hypertension    controlled on meds  . Personal history of tobacco use, presenting hazards to health 06/12/2015  . Personal history of tobacco use, presenting hazards to health 06/12/2015  . Prostate cancer [facility])    Had prostatectomy  . Wears dentures    upper   Social History   Socioeconomic History  . Marital status: Married    Spouse name: Not on file  . Number of children: 2  . Years of education: HS Diploma  . Highest education level: 12th grade  Occupational History  . Occupation: Retired  Science writer  Needs  . Financial resource strain: Not hard at all  . Food insecurity:    Worry: Never true    Inability: Never true  . Transportation needs:    Medical: No    Non-medical: No  Tobacco Use  . Smoking status: Current Every Day Smoker    Packs/day: 0.75    Years: 50.00    Pack years: 37.50    Types: Cigarettes  . Smokeless tobacco: Never Used  Substance and Sexual Activity  . Alcohol use: No    Alcohol/week: 0.0 oz  . Drug use: No  . Sexual activity: Not on file  Lifestyle  . Physical activity:    Days per week: Not on file    Minutes per session: Not on file  . Stress:  Not at all  Relationships  . Social connections:    Talks on phone: Not on file    Gets together: Not on file    Attends religious service: Not on file    Active member of club or organization: Not on file    Attends meetings of clubs or organizations: Not on file    Relationship status: Not on file  . Intimate partner violence:    Fear of current or ex partner: Not on file    Emotionally abused: Not on file    Physically abused: Not on file    Forced sexual activity: Not on file  Other Topics Concern  . Not on file  Social History Narrative   Son passed at age 109 from cerebral palsy.   Past Surgical History:  Procedure Laterality Date  . BACK SURGERY  1998   Lumbar spine fusion  . carotid doppler ultrasound  10/05/2011   75-90% RCA occlusion, 50% on left  . CAROTID ENDARTERECTOMY Right 12/02/2011   Dr. Lucky Cowboy  . CHOLECYSTECTOMY  10/02/2010   Laparoscopic, Dr. Pat Patrick, [facility]  . COLONOSCOPY WITH PROPOFOL N/A 06/20/2017   Procedure: COLONOSCOPY WITH PROPOFOL;  Surgeon: [name], MD;  Location: [place];  Service: Endoscopy;  Laterality: N/A;  . 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 Cholelithiasis and acute cholecystiis. Mild enlargement of left adrenal gland  . LUMBAR SPINE HARDWARE REMOVAL  07/30/2016   [name], MD  . PROSTATE SURGERY  10/19/2012   prostatectomy. [facility]; Laser assisted, Done by [name] for prostate cancer   Family History  Problem Relation Age of Onset  . Heart attack Mother   . Congestive Heart Failure Father   . Cerebral palsy Son    Allergies  Allergen Reactions  . Zolpidem     Other reaction(s): Other (See Comments) GOT UP IN MIDDLE OF NIGHT AND FILLED ALL THE GLASSES WITH FLOUR      Assessment & Plan:  Patient presents for a six-month AAA follow-up.  Patient presents without complaint today patient denies any abdominal pain, back pain or  thrombosis to the bilateral lower extremity.  The patient underwent a AAA duplex which was notable for a AAA measuring 4.3 cm x 4.5 cm, right common iliac artery measuring 1.6 cm x 1.5 cm and the left common iliac artery measuring 1.5 cm x 1.6 cm.  Biphasic waveforms throughout.  Previous diameter measurements taken on January 19, 2017 were notable for AAA measuring 4.4 cm x 4.0 cm.  Patient denies any claudication-like symptoms, rest pain or ulceration to the bilateral lower extremity.  Patient denies any fever, nausea vomiting.  1.  AAA (abdominal aortic aneurysm) without rupture (HCC) - Stable Studies reviewed with patient. Duplex stable, physical exam unremarkable. No surgery or intervention at this time. The patient to follow up in six months with an aortic duplex. The patient has an asymptomatic abdominal aortic aneurysm that is greater than 4 cm in maximal diameter.  I have reviewed the natural history of abdominal aortic aneurysm and the small risk of rupture for aneurysm less than 5 cm in size.  However, as these small aneurysms tend to enlarge over time, continued surveillance with ultrasound or CT scan is mandatory.  The patient's blood pressure is being adequately controlled however I have reviewed the importance of hypertension and lipid control and the importance of continuing his abstinence from tobacco.  The patient is also encouraged to exercise a minimum of 30 minutes 4 times a week.  Should the patient develop new onset abdominal or back pain or signs of peripheral embolization they are instructed to seek medical attention immediately and to alert the physician providing care that they have an aneurysm.  The patient voices their understanding.  - VAS [place] AAA DUPLEX; Future  2. Bilateral carotid artery stenosis - Stable Patient presents asymptomatically Physical exam is unremarkable This is followed on a yearly basis Next carotid duplex is December 2019  3. Hypercholesteremia -  Stable Encouraged good control as its slows the progression of atherosclerotic disease  4. Essential hypertension - Stable Encouraged good control as its slows the progression of atherosclerotic disease  Current Outpatient Medications on File Prior to Visit  Medication Sig Dispense Refill  . aspirin EC 81 MG tablet Take 1 tablet by mouth daily. pm    . atorvastatin (LIPITOR) 80 MG tablet TAKE 1 TABLET(80 MG) BY MOUTH DAILY 90 tablet 4  . DOXYLAMINE SUCCINATE, SLEEP, PO Take by mouth. pm    . hydrochlorothiazide (HYDRODIURIL) 25 MG tablet TAKE 1 TABLET(25 MG) BY MOUTH DAILY 90 tablet 4  . Oxycodone HCl 10 MG TABS Take 1 tablet (10 mg total) by mouth 4 (four) times daily. 120 tablet 0  . traZODone (DESYREL) 150 MG tablet TAKE 1 TABLET(150 MG) BY MOUTH AT BEDTIME AS NEEDED 90 tablet 4  . Wheat Dextrin (BENEFIBER) POWD Stir 2 tsp. TID into 4-8 oz of any non-carbonated beverage or soft food (hot or cold) 500 g PRN   No current facility-administered medications on file prior to visit.    There are no Patient Instructions on file for this visit. No follow-ups on file.  KIMBERLY A STEGMAYER, PA-C

## 2017-07-26 ENCOUNTER — Telehealth: Payer: Self-pay | Admitting: *Deleted

## 2017-07-26 DIAGNOSIS — Z87891 Personal history of nicotine dependence: Secondary | ICD-10-CM

## 2017-07-26 DIAGNOSIS — Z122 Encounter for screening for malignant neoplasm of respiratory organs: Secondary | ICD-10-CM

## 2017-07-26 NOTE — Telephone Encounter (Signed)
Notified patient that annual lung cancer screening low dose CT scan is due currently or will be in near future. Confirmed that patient is within the age range of 55-77, and asymptomatic, (no signs or symptoms of lung cancer). Patient denies illness that would prevent curative treatment for lung cancer if found. Verified smoking history, (current, 38.5 pack year). The shared decision making visit was done 06/13/15. Patient is agreeable for CT scan being scheduled.

## 2017-07-28 ENCOUNTER — Other Ambulatory Visit: Payer: Self-pay | Admitting: Family Medicine

## 2017-07-28 ENCOUNTER — Ambulatory Visit
Admission: RE | Admit: 2017-07-28 | Discharge: 2017-07-28 | Disposition: A | Discharge status: Home / Self Care | Payer: PPO | Source: Ambulatory Visit | Attending: Oncology | Admitting: Oncology

## 2017-07-28 DIAGNOSIS — I700 Atherosclerosis of aorta: Secondary | ICD-10-CM | POA: Insufficient documentation

## 2017-07-28 DIAGNOSIS — Z122 Encounter for screening for malignant neoplasm of respiratory organs: Secondary | ICD-10-CM | POA: Diagnosis not present

## 2017-07-28 DIAGNOSIS — Z87891 Personal history of nicotine dependence: Secondary | ICD-10-CM | POA: Insufficient documentation

## 2017-07-28 DIAGNOSIS — J438 Other emphysema: Secondary | ICD-10-CM | POA: Diagnosis not present

## 2017-07-28 DIAGNOSIS — J432 Centrilobular emphysema: Secondary | ICD-10-CM | POA: Insufficient documentation

## 2017-07-28 DIAGNOSIS — I2510 Atherosclerotic heart disease of native coronary artery without angina pectoris: Secondary | ICD-10-CM | POA: Insufficient documentation

## 2017-07-28 DIAGNOSIS — F17210 Nicotine dependence, cigarettes, uncomplicated: Secondary | ICD-10-CM | POA: Diagnosis not present

## 2017-07-28 MED ORDER — OXYCODONE HCL 10 MG PO TABS: 10.0000 mg | ORAL_TABLET | Freq: Four times a day (QID) | ORAL | 0 refills | Status: DC

## 2017-07-28 NOTE — Telephone Encounter (Signed)
Pt needs refill on his pain med,  oxycodone 10 mg.  He uses [facility]

## 2017-08-01 ENCOUNTER — Encounter: Payer: Self-pay | Admitting: *Deleted

## 2017-08-01 ENCOUNTER — Other Ambulatory Visit: Payer: Self-pay | Admitting: Family Medicine

## 2017-08-01 DIAGNOSIS — F1190 Opioid use, unspecified, uncomplicated: Secondary | ICD-10-CM

## 2017-08-01 DIAGNOSIS — Z79891 Long term (current) use of opiate analgesic: Secondary | ICD-10-CM

## 2017-08-11 ENCOUNTER — Other Ambulatory Visit: Payer: Self-pay | Admitting: Family Medicine

## 2017-08-11 DIAGNOSIS — E7800 Pure hypercholesterolemia, unspecified: Secondary | ICD-10-CM

## 2017-08-25 ENCOUNTER — Other Ambulatory Visit: Payer: Self-pay | Admitting: Family Medicine

## 2017-08-29 ENCOUNTER — Other Ambulatory Visit: Payer: Self-pay | Admitting: Family Medicine

## 2017-08-29 MED ORDER — OXYCODONE HCL 10 MG PO TABS: 10.0000 mg | ORAL_TABLET | Freq: Four times a day (QID) | ORAL | 0 refills | Status: DC

## 2017-08-29 NOTE — Telephone Encounter (Signed)
Pt needs refill on his oxycodone 10 mg #120  [facility] [place]   Thanks, Con Memos

## 2017-10-04 ENCOUNTER — Other Ambulatory Visit: Payer: Self-pay

## 2017-10-04 MED ORDER — OXYCODONE HCL 10 MG PO TABS: 10.0000 mg | ORAL_TABLET | Freq: Four times a day (QID) | ORAL | 0 refills | Status: DC

## 2017-10-31 ENCOUNTER — Other Ambulatory Visit: Payer: Self-pay | Admitting: Family Medicine

## 2017-10-31 MED ORDER — OXYCODONE HCL 10 MG PO TABS: 10.0000 mg | ORAL_TABLET | Freq: Four times a day (QID) | ORAL | 0 refills | Status: DC

## 2017-10-31 NOTE — Telephone Encounter (Signed)
Pt needs a refill on   Oxycodone 10 mg  [facility]  [place]  CB#  [phone]  Thanks,  Con [employer]os

## 2017-11-15 ENCOUNTER — Other Ambulatory Visit: Payer: Self-pay | Admitting: Family Medicine

## 2017-11-15 MED ORDER — TRAZODONE HCL 150 MG PO TABS: ORAL_TABLET | ORAL | 3 refills | Status: DC

## 2017-11-15 NOTE — Telephone Encounter (Signed)
Pt needs a refill   Trazodone 150 mg  [facility] [place]  CB#  [phone]  Thanks Con Memos

## 2017-11-29 ENCOUNTER — Other Ambulatory Visit: Payer: Self-pay | Admitting: Family Medicine

## 2017-11-29 NOTE — Telephone Encounter (Signed)
Please advise 

## 2017-11-29 NOTE — Telephone Encounter (Signed)
Pt needing refill by Fri because he is going out of town for a funeral.  Please refill: Oxycodone HCl 10 MG TABS  Please fill at:  [facility], [place] [address] AT [place] [phone] (Phone) [phone] (Fax)    Thanks, [employer]

## 2017-11-30 MED ORDER — OXYCODONE HCL 10 MG PO TABS: 10.0000 mg | ORAL_TABLET | Freq: Four times a day (QID) | ORAL | 0 refills | Status: DC

## 2017-11-30 NOTE — Telephone Encounter (Signed)
Patient was advised via vm per DPR.,PC

## 2018-01-02 ENCOUNTER — Other Ambulatory Visit: Payer: Self-pay

## 2018-01-02 MED ORDER — OXYCODONE HCL 10 MG PO TABS: 10.0000 mg | ORAL_TABLET | Freq: Four times a day (QID) | ORAL | 0 refills | Status: DC

## 2018-01-19 ENCOUNTER — Ambulatory Visit (INDEPENDENT_AMBULATORY_CARE_PROVIDER_SITE_OTHER): Payer: PPO | Admitting: Nurse Practitioner

## 2018-01-19 ENCOUNTER — Encounter (INDEPENDENT_AMBULATORY_CARE_PROVIDER_SITE_OTHER): Payer: Self-pay | Admitting: Nurse Practitioner

## 2018-01-19 ENCOUNTER — Ambulatory Visit (INDEPENDENT_AMBULATORY_CARE_PROVIDER_SITE_OTHER): Payer: PPO

## 2018-01-19 ENCOUNTER — Ambulatory Visit (INDEPENDENT_AMBULATORY_CARE_PROVIDER_SITE_OTHER): Payer: PPO | Admitting: Vascular Surgery

## 2018-01-19 ENCOUNTER — Encounter (INDEPENDENT_AMBULATORY_CARE_PROVIDER_SITE_OTHER): Payer: PPO

## 2018-01-19 VITALS — BP 130/72 | HR 68 | Resp 16 | Ht 73.0 in | Wt 169.6 lb

## 2018-01-19 DIAGNOSIS — F17210 Nicotine dependence, cigarettes, uncomplicated: Secondary | ICD-10-CM

## 2018-01-19 DIAGNOSIS — Z7982 Long term (current) use of aspirin: Secondary | ICD-10-CM

## 2018-01-19 DIAGNOSIS — I714 Abdominal aortic aneurysm, without rupture, unspecified: Secondary | ICD-10-CM

## 2018-01-19 DIAGNOSIS — I6523 Occlusion and stenosis of bilateral carotid arteries: Secondary | ICD-10-CM

## 2018-01-19 DIAGNOSIS — I713 Abdominal aortic aneurysm, ruptured, unspecified: Secondary | ICD-10-CM

## 2018-01-19 DIAGNOSIS — I10 Essential (primary) hypertension: Secondary | ICD-10-CM | POA: Diagnosis not present

## 2018-01-19 NOTE — Progress Notes (Signed)
Subjective:    Patient ID: Justin Harrington, male    DOB: Jul 20, 1945, 72 y.o.   MRN: [id number] Chief Complaint  Patient presents with  . Follow-up    HPI  Justin Harrington is a 72 y.o. male presents today for follow-up of his carotid stenosis as well as abdominal aortic aneurysm. Patient denies abdominal pain or back pain, no other abdominal complaints. No changes suggesting embolic episodes.   The patient denies amaurosis fugax. There is no recent history of TIA symptoms or focal motor deficits. There is no prior documented CVA.  The patient is taking enteric-coated aspirin 81 mg daily.  There is no history of migraine headaches. There is no history of seizures.  The patient has a history of coronary artery disease, no recent episodes of angina or shortness of breath. The patient denies PAD or claudication symptoms. There is a history of hyperlipidemia which is being treated with a statin.    There have been no interval changes in the patient's overall health care since his last visit.  Duplex US of the aorta and iliac arteries shows an AAA measured 4.52 cm x 5.31 cm with the right common iliac artery measuring 1.6 x 1.5 and the left common iliac artery measuring 1.5 x 1.6.  Compared to the previous study on 07/20/2017 the iliac arteries are approximately the same size, however the abdominal aortic aneurysm has increased from 4.6 cm to 5.31 cm.   Carotid Duplex done today shows 1 to 39% stenosis in the right carotid artery with a previous endarterectomy on 12/02/2011.  The left has velocities in the 40 to 59% range, with velocities slightly higher than previous study on 01/17/2017.    Past Medical History:  Diagnosis Date  . Aortic aneurysm ([place])   . Coronary artery disease   . DVT (deep venous thrombosis) ([place])    H/O DVT in left leg, filter placed approximatly 8 yrs ago  . H/O spinal fusion 1998   Back pain  . Hypertension    controlled on meds  . Personal history of  tobacco use, presenting hazards to health 06/12/2015  . Personal history of tobacco use, presenting hazards to health 06/12/2015  . Prostate cancer [facility])    Had prostatectomy  . Wears dentures    upper    Past Surgical History:  Procedure Laterality Date  . BACK SURGERY  1998   Lumbar spine fusion  . carotid doppler ultrasound  10/05/2011   75-90% RCA occlusion, 50% on left  . CAROTID ENDARTERECTOMY Right 12/02/2011   Dr. Lucky Cowboy  . CHOLECYSTECTOMY  10/02/2010   Laparoscopic, Dr. Pat Patrick, [facility]  . COLONOSCOPY WITH PROPOFOL N/A 06/20/2017   Procedure: COLONOSCOPY WITH PROPOFOL;  Surgeon: [name], MD;  Location: [place];  Service: Endoscopy;  Laterality: N/A;  . 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 Cholelithiasis and acute cholecystiis. Mild enlargement of left adrenal gland  . LUMBAR SPINE HARDWARE REMOVAL  07/30/2016   [name], MD  . PROSTATE SURGERY  10/19/2012   prostatectomy. [facility]; Laser assisted, Done by [name] for prostate cancer    Social History   Socioeconomic History  . Marital status: Married    Spouse name: Not on file  . Number of children: 2  . Years of education: HS Diploma  . Highest education level: 12th grade  Occupational History  . 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 Food insecurity:    Worry: Never true    Inability: Never true  . Transportation needs:    Medical: No    Non-medical: No  Tobacco Use  . Smoking status: Current Every Day Smoker    Packs/day: 0.75    Years: 50.00    Pack years: 37.50    Types: Cigarettes  . Smokeless tobacco: Never Used  Substance and Sexual Activity  . Alcohol use: No    Alcohol/week: 0.0 standard drinks  . Drug use: No  . Sexual activity: Not on file  Lifestyle  . Physical activity:    Days per week: Not on file    Minutes per session: Not on file  .  Stress: Not at all  Relationships  . Social connections:    Talks on phone: Not on file    Gets together: Not on file    Attends religious service: Not on file    Active member of club or organization: Not on file    Attends meetings of clubs or organizations: Not on file    Relationship status: Not on file  . Intimate partner violence:    Fear of current or ex partner: Not on file    Emotionally abused: Not on file    Physically abused: Not on file    Forced sexual activity: Not on file  Other Topics Concern  . Not on file  Social History Narrative   Son passed at age 40 from cerebral palsy.    Family History  Problem Relation Age of Onset  . Heart attack Mother   . Congestive Heart Failure Father   . Cerebral palsy Son     Allergies  Allergen Reactions  . Zolpidem     Other reaction(s): Other (See Comments) GOT UP IN MIDDLE OF NIGHT AND FILLED ALL THE GLASSES WITH FLOUR     Review of Systems   Review of Systems: Negative Unless Checked Constitutional: [] Weight loss  [] Fever  [] Chills Cardiac: [] Chest pain   []  Atrial Fibrillation  [] Palpitations   [] Shortness of breath when laying flat   [] Shortness of breath with exertion. [] Shortness of breath at rest Vascular:  [] Pain in legs with walking   [] Pain in legs with standing [] Pain in legs when laying flat   [] Claudication    [] Pain in feet when laying flat    [x] History of DVT   [] Phlebitis   [] Swelling in legs   [] Varicose veins   [] Non-healing ulcers Pulmonary:   [] Uses home oxygen   [] Productive cough   [] Hemoptysis   [] Wheeze  [] COPD   [] Asthma Neurologic:  [] Dizziness   [] Seizures  [] Blackouts [] History of stroke   [] History of TIA  [] Aphasia   [] Temporary Blindness   [] Weakness or numbness in arm   [] Weakness or numbness in leg Musculoskeletal:   [] Joint swelling   [x] Joint pain   [x] Low back pain  []  History of Knee Replacement [] Arthritis [x] back Surgeries  []  Spinal Stenosis    Hematologic:  [] Easy bruising   [] Easy bleeding   [] Hypercoagulable state   [] Anemic Gastrointestinal:  [] Diarrhea   [] Vomiting  [] Gastroesophageal reflux/heartburn   [] Difficulty swallowing. [] Abdominal pain Genitourinary:  [] Chronic kidney disease   [] Difficult urination  [] Anuric   [] Blood in urine [] Frequent urination  [] Burning with urination   [] Hematuria Skin:  [] Rashes   [] Ulcers [] Wounds Psychological:  [] History of anxiety   []  History of major depression  []  Memory Difficulties     Objective:  Physical Exam  BP 130/72 (BP Location: Right Arm, Patient Position: Sitting)   Pulse 68   Resp 16   Ht 6\' 1"  (1.854 m)   Wt 169 lb 9.6 oz (76.9 kg)   BMI 22.38 kg/m   Gen: WD/WN, NAD Head: [place]/AT, No temporalis wasting.  Ear/Nose/Throat: Hearing grossly intact, nares w/o erythema or drainage Eyes: PER, EOMI, sclera nonicteric.  Neck: Supple, no masses.  No JVD.  No bruit auscultated Pulmonary:  Good air movement, no use of accessory muscles.  Cardiac: RRR Vascular:  Vessel Right Left  Radial Palpable Palpable  Dorsalis Pedis Palpable Palpable  Posterior Tibial Palpable Palpable   Gastrointestinal: soft, non-distended. No guarding/no peritoneal signs.  Musculoskeletal: M/S 5/5 throughout.  No deformity or atrophy.  Neurologic: Pain and light touch intact in extremities.  Symmetrical.  Speech is fluent. Motor exam as listed above. Psychiatric: Judgment intact, Mood & affect appropriate for pt's clinical situation. Dermatologic: No Venous rashes. No Ulcers Noted.  No changes consistent with cellulitis. Lymph : No Cervical lymphadenopathy, no lichenification or skin changes of chronic lymphedema.      Assessment & Plan:   1. Abdominal aortic aneurysm, ruptured ([place]) Duplex US of the aorta and iliac arteries shows an AAA measured 4.52 cm x 5.31 cm with the right common iliac artery measuring 1.6 x 1.5 and the left common iliac artery measuring 1.5 x 1.6.  Compared to the previous study on 07/20/2017 the iliac  arteries are approximately the same size, however the abdominal aortic aneurysm has increased from 4.6 cm to 5.31 cm.   Recommend: The patient has an abdominal aortic aneurysm that is 5.0 cm by duplex scan and based on this study it appears that it is suitable for endovascular treatment.  The patient is otherwise in reasonable health.   Therefore, the patient should undergo endovascular repair of the AAA to prevent future lethal rupture.   Patient will require CT angiography of the abdomen and pelvis in order to appropriately plan repair of the AAA.  The risks and benefits as well as the alternative therapies was discussed in detail with the patient. All questions were answered. The patient agrees to move forward the AAA repair and therefore with CT scan.  The patient will follow up with me in the office after the CT scan to review the study. - CT Angio Abd/Pel w/ and/or w/o; Future  2. Bilateral carotid artery stenosis Recommend:  Given the patient's asymptomatic subcritical stenosis no further invasive testing or surgery at this time.  Carotid Duplex done today shows 1 to 39% stenosis in the right carotid artery with a previous endarterectomy on 12/02/2011.  The left has velocities in the 40 to 59% range, with velocities slightly higher than previous study on 01/17/2017.    Continue antiplatelet therapy as prescribed Continue management of CAD, HTN and Hyperlipidemia Healthy heart diet,  encouraged exercise at least 4 times per week Follow up in 12 months with duplex ultrasound and physical exam   3. Essential hypertension Continue antihypertensive medications as already ordered, these medications have been reviewed and there are no changes at this time.   4. Smoking greater than 30 pack years Smoking cessation was discussed, 3-10 minutes spent on this topic specifically    Current Outpatient Medications on File Prior to Visit  Medication Sig Dispense Refill  . aspirin EC 81  MG tablet Take 1 tablet by mouth daily. pm    . atorvastatin (LIPITOR) 80 MG tablet Take 1 tablet (80 mg  total) by mouth daily. 90 tablet 4  . DOXYLAMINE SUCCINATE, SLEEP, PO Take by mouth. pm    . hydrochlorothiazide (HYDRODIURIL) 25 MG tablet TAKE 1 TABLET(25 MG) BY MOUTH DAILY 90 tablet 3  . Oxycodone HCl 10 MG TABS Take 1 tablet (10 mg total) by mouth 4 (four) times daily. 120 tablet 0  . traZODone (DESYREL) 150 MG tablet TAKE 1 TABLET(150 MG) BY MOUTH AT BEDTIME AS NEEDED 90 tablet 3  . Wheat Dextrin (BENEFIBER) POWD Stir 2 tsp. TID into 4-8 oz of any non-carbonated beverage or soft food (hot or cold) 500 g PRN   No current facility-administered medications on file prior to visit.     There are no Patient Instructions on file for this visit. No follow-ups on file.   Kris Hartmann, NP  This note was completed with Sales executive.  Any errors are purely unintentional.

## 2018-01-30 ENCOUNTER — Other Ambulatory Visit: Payer: Self-pay | Admitting: Family Medicine

## 2018-01-30 MED ORDER — OXYCODONE HCL 10 MG PO TABS: 10.0000 mg | ORAL_TABLET | Freq: Four times a day (QID) | ORAL | 0 refills | Status: DC

## 2018-01-30 NOTE — Telephone Encounter (Signed)
Please advise 

## 2018-01-30 NOTE — Telephone Encounter (Signed)
Pt needing a refill on:  Oxycodone HCl 10 MG TABS  Please fill at:  [id number], [place] [address] AT [place] [phone] (Phone) [phone] (Fax)   Thanks, [employer]

## 2018-02-06 ENCOUNTER — Telehealth (INDEPENDENT_AMBULATORY_CARE_PROVIDER_SITE_OTHER): Payer: Self-pay

## 2018-02-06 ENCOUNTER — Other Ambulatory Visit (INDEPENDENT_AMBULATORY_CARE_PROVIDER_SITE_OTHER): Payer: Self-pay | Admitting: Nurse Practitioner

## 2018-02-06 NOTE — Telephone Encounter (Signed)
Patient called stating that he was to have surgery. I researched and found that the patient is to have a CTA for his aneurysm and be seen to review the CT. Patient was given this information and I gave him Radiology number to call because the referral was placed a couple of weeks ago. I advised the patient to contact us after his CT was schedule to come back in to review the CT.

## 2018-02-16 ENCOUNTER — Ambulatory Visit
Admission: RE | Admit: 2018-02-16 | Discharge: 2018-02-16 | Disposition: A | Discharge status: Home / Self Care | Payer: PPO | Source: Ambulatory Visit | Attending: Nurse Practitioner | Admitting: Nurse Practitioner

## 2018-02-16 DIAGNOSIS — I714 Abdominal aortic aneurysm, without rupture: Secondary | ICD-10-CM | POA: Diagnosis not present

## 2018-02-16 DIAGNOSIS — I713 Abdominal aortic aneurysm, ruptured, unspecified: Secondary | ICD-10-CM

## 2018-02-16 LAB — POCT I-STAT CREATININE: CREATININE: 1.3 mg/dL — AB (ref 0.61–1.24)

## 2018-02-16 MED ORDER — IOHEXOL 350 MG/ML SOLN
75.0000 mL | Freq: Once | INTRAVENOUS | Status: AC | PRN
  Administered 2018-02-16: 75 mL via INTRAVENOUS

## 2018-02-17 ENCOUNTER — Encounter (INDEPENDENT_AMBULATORY_CARE_PROVIDER_SITE_OTHER): Payer: Self-pay | Admitting: Vascular Surgery

## 2018-02-17 ENCOUNTER — Ambulatory Visit (INDEPENDENT_AMBULATORY_CARE_PROVIDER_SITE_OTHER): Payer: PPO | Admitting: Vascular Surgery

## 2018-02-17 VITALS — BP 151/79 | HR 72 | Resp 14 | Ht 73.0 in | Wt 176.2 lb

## 2018-02-17 DIAGNOSIS — I714 Abdominal aortic aneurysm, without rupture, unspecified: Secondary | ICD-10-CM

## 2018-02-17 DIAGNOSIS — E7800 Pure hypercholesterolemia, unspecified: Secondary | ICD-10-CM

## 2018-02-17 DIAGNOSIS — I10 Essential (primary) hypertension: Secondary | ICD-10-CM

## 2018-02-17 NOTE — Progress Notes (Signed)
MRN : [id number]  Justin Harrington is a 73 y.o. ([birth year]) male who presents with chief complaint of  Chief Complaint  Patient presents with  . Follow-up  .  History of Present Illness: Patient returns today in follow up of his AAA.  He has undergone a CT scan which I have reviewed.  This demonstrates an approximately 5 cm abdominal aortic aneurysm which begins about a centimeter to a centimeter and a half below the renal arteries.  There is some mural thrombus in the infrarenal neck which measures anywhere from 28 to 30 mm in diameter.  There also appears to be a significant right renal artery stenosis with the right renal artery being the lower of the 2 vessels.  Current Outpatient Medications  Medication Sig Dispense Refill  . aspirin EC 81 MG tablet Take 1 tablet by mouth daily. pm    . atorvastatin (LIPITOR) 80 MG tablet Take 1 tablet (80 mg total) by mouth daily. 90 tablet 4  . hydrochlorothiazide (HYDRODIURIL) 25 MG tablet TAKE 1 TABLET(25 MG) BY MOUTH DAILY 90 tablet 3  . Oxycodone HCl 10 MG TABS Take 1 tablet (10 mg total) by mouth 4 (four) times daily. 120 tablet 0  . traZODone (DESYREL) 150 MG tablet TAKE 1 TABLET(150 MG) BY MOUTH AT BEDTIME AS NEEDED 90 tablet 3  . Wheat Dextrin (BENEFIBER) POWD Stir 2 tsp. TID into 4-8 oz of any non-carbonated beverage or soft food (hot or cold) (Patient not taking: Reported on 02/17/2018) 500 g PRN   No current facility-administered medications for this visit.     Past Medical History:  Diagnosis Date  . Aortic aneurysm ([place])   . Coronary artery disease   . DVT (deep venous thrombosis) ([place])    H/O DVT in left leg, filter placed approximatly 8 yrs ago  . H/O spinal fusion 1998   Back pain  . Hypertension    controlled on meds  . Personal history of tobacco use, presenting hazards to health 06/12/2015  . Personal history of tobacco use, presenting hazards to health 06/12/2015  . Prostate cancer [facility])    Had prostatectomy  . Wears  dentures    upper    Past Surgical History:  Procedure Laterality Date  . BACK SURGERY  1998   Lumbar spine fusion  . carotid doppler ultrasound  10/05/2011   75-90% RCA occlusion, 50% on left  . CAROTID ENDARTERECTOMY Right 12/02/2011   Dr. Lucky Cowboy  . CHOLECYSTECTOMY  10/02/2010   Laparoscopic, Dr. Pat Patrick, [facility]  . COLONOSCOPY WITH PROPOFOL N/A 06/20/2017   Procedure: COLONOSCOPY WITH PROPOFOL;  Surgeon: [name], MD;  Location: [place];  Service: Endoscopy;  Laterality: N/A;  . 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 Cholelithiasis and acute cholecystiis. Mild enlargement of left adrenal gland  . LUMBAR SPINE HARDWARE REMOVAL  07/30/2016   [name], MD  . PROSTATE SURGERY  10/19/2012   prostatectomy. [facility]; Laser assisted, Done by [name] for prostate cancer   Social History  Substance Use Topics  . Smoking status: Current Every Day Smoker    Packs/day: 0.75    Years: 50.00    Types: Cigarettes  . Smokeless tobacco: Never Used  . Alcohol use No    Family History      Family History  Problem Relation Age of Onset  . Heart attack Mother   . Congestive Heart Failure Father   .  Cerebral palsy Son     No Known Allergies   REVIEW OF SYSTEMS (Negative unless checked)  Constitutional: [] ?Weight loss  [] ?Fever  [] ?Chills Cardiac: [] ?Chest pain   [] ?Chest pressure   [] ?Palpitations   [] ?Shortness of breath when laying flat   [] ?Shortness of breath at rest   [x] ?Shortness of breath with exertion. Vascular:  [x] ?Pain in legs with walking   [] ?Pain in legs at rest   [] ?Pain in legs when laying flat   [] ?Claudication   [] ?Pain in feet when walking  [] ?Pain in feet at rest  [] ?Pain in feet when laying flat   [] ?History of DVT   [] ?Phlebitis   [] ?Swelling in legs   [] ?Varicose veins   [] ?Non-healing ulcers Pulmonary:   [] ?Uses home oxygen   [] ?Productive cough    [] ?Hemoptysis   [] ?Wheeze  [] ?COPD   [] ?Asthma Neurologic:  [] ?Dizziness  [] ?Blackouts   [] ?Seizures   [] ?History of stroke   [] ?History of TIA  [] ?Aphasia   [] ?Temporary blindness   [] ?Dysphagia   [] ?Weakness or numbness in arms   [] ?Weakness or numbness in legs Musculoskeletal:  [x] ?Arthritis   [] ?Joint swelling   [] ?Joint pain   [x] ?Low back pain Hematologic:  [] ?Easy bruising  [] ?Easy bleeding   [] ?Hypercoagulable state   [] ?Anemic   Gastrointestinal:  [] ?Blood in stool   [] ?Vomiting blood  [] ?Gastroesophageal reflux/heartburn   [] ?Abdominal pain Genitourinary:  [] ?Chronic kidney disease   [] ?Difficult urination  [] ?Frequent urination  [] ?Burning with urination   [] ?Hematuria Skin:  [] ?Rashes   [] ?Ulcers   [] ?Wounds Psychological:  [] ?History of anxiety   [] ? History of major depression.    Physical Examination  BP (!) 151/79 (BP Location: Right Arm, Patient Position: Sitting, Cuff Size: Normal)   Pulse 72   Resp 14   Ht 6\' 1"  (1.854 m)   Wt 176 lb 3.2 oz (79.9 kg)   BMI 23.25 kg/m  Gen:  WD/WN, NAD Head: [place]/AT, No temporalis wasting. Ear/Nose/Throat: Hearing grossly intact, nares w/o erythema or drainage Eyes: Conjunctiva clear. Sclera non-icteric Neck: Supple.  Trachea midline Pulmonary:  Good air movement, no use of accessory muscles.  Cardiac: RRR, no JVD Vascular:  Vessel Right Left  Radial Palpable Palpable                          PT Palpable Palpable  DP Palpable Palpable   Gastrointestinal: soft, non-tender/non-distended. Increased aortic impulse Musculoskeletal: M/S 5/5 throughout.  No deformity or atrophy. No edema. Neurologic: Sensation grossly intact in extremities.  Symmetrical.  Speech is fluent.  Psychiatric: Judgment intact, Mood & affect appropriate for pt's clinical situation. Dermatologic: No rashes or ulcers noted.  No cellulitis or open wounds.       Labs Recent Results (from the past 2160 hour(s))  I-STAT creatinine     Status: Abnormal     Collection Time: 02/16/18  1:26 PM  Result Value Ref Range   Creatinine, Ser 1.30 (H) 0.61 - 1.24 mg/dL    Radiology Vas [place] Aaa Duplex  Result Date: 01/20/2018 ABDOMINAL AORTA STUDY Indications: Follow up exam for known AAA.  Comparison Study: 07/20/2017 Performing Technologist: Charlane Ferretti RT (R)(VS)  Examination Guidelines: A complete evaluation includes B-mode imaging, spectral Doppler, color Doppler, and power Doppler as needed of all accessible portions of each vessel. Bilateral testing is considered an integral part of a complete examination. Limited examinations for reoccurring indications may be performed as noted.  Abdominal Aorta Findings: +-----------+-------+----------+----------+--------+--------+--------+ Location   AP (cm)Trans (cm)PSV (  cm/s)WaveformThrombusComments +-----------+-------+----------+----------+--------+--------+--------+ Proximal   3.00   2.90      59                                 +-----------+-------+----------+----------+--------+--------+--------+ Mid        3.30   3.86      47                Present          +-----------+-------+----------+----------+--------+--------+--------+ Distal     4.52   5.31      43                Present          +-----------+-------+----------+----------+--------+--------+--------+ RT CIA Prox1.6    1.5       107               Present          +-----------+-------+----------+----------+--------+--------+--------+ LT CIA Prox1.5    1.6       146                                +-----------+-------+----------+----------+--------+--------+--------+  Summary: Abdominal Aorta: There is evidence of abnormal dilitation of the Mid and Distal Abdominal aorta. The largest aortic diameter has increased compared to prior exam. Previous diameter measurement was 4.6 cm obtained on 07/20/2017. IVC/Iliac: The largest aortic diameter has icreased from 4.6cm to 5.31cm since the previous exam on 07/20/2017.  *See  table(s) above for measurements and observations.  Electronically signed by Leotis Pain MD on 01/20/2018 at 1:33:42 PM.   Final    Vas US Carotid  Result Date: 01/20/2018 Carotid Arterial Duplex Study Indications:       Carotid artery disease. Other Factors:     12/02/2011 Right carotid endarterectomy. Comparison Study:  01/17/2017 Performing Technologist: Charlane Ferretti RT (R)(VS)  Examination Guidelines: A complete evaluation includes B-mode imaging, spectral Doppler, color Doppler, and power Doppler as needed of all accessible portions of each vessel. Bilateral testing is considered an integral part of a complete examination. Limited examinations for reoccurring indications may be performed as noted.  Right Carotid Findings: +----------+--------+--------+--------+--------+------------------+           PSV cm/sEDV cm/sStenosisDescribeComments           +----------+--------+--------+--------+--------+------------------+ CCA Prox  80      14                                         +----------+--------+--------+--------+--------+------------------+ CCA Mid   86      18              calcific                   +----------+--------+--------+--------+--------+------------------+ CCA Distal136     22              calcificintimal thickening +----------+--------+--------+--------+--------+------------------+ ICA Prox  102     18                      intimal thickening +----------+--------+--------+--------+--------+------------------+ ICA Mid   94      24                                         +----------+--------+--------+--------+--------+------------------+  ICA Distal89      32                                         +----------+--------+--------+--------+--------+------------------+ ECA       239     15                                         +----------+--------+--------+--------+--------+------------------+ Right ICA/CCA ratio = 1.2 Left Carotid Findings:  +----------+--------+--------+--------+--------+------------------+           PSV cm/sEDV cm/sStenosisDescribeComments           +----------+--------+--------+--------+--------+------------------+ CCA Prox  97      16              calcific                   +----------+--------+--------+--------+--------+------------------+ CCA Mid   81      17              calcificintimal thickening +----------+--------+--------+--------+--------+------------------+ CCA Distal88      14              calcific                   +----------+--------+--------+--------+--------+------------------+ ICA Prox  129     33              calcific                   +----------+--------+--------+--------+--------+------------------+ ICA Mid   104     27                                         +----------+--------+--------+--------+--------+------------------+ ICA Distal82      19                                         +----------+--------+--------+--------+--------+------------------+ ECA       144     5                                          +----------+--------+--------+--------+--------+------------------+ Left ICA/CCA ratio = 1.6  Summary: Right Carotid: Velocities in the right ICA are consistent with a 1-39% stenosis.                The ECA appears <50% stenosed. Right carotid artery is patent                with mild restenosis. Left Carotid: Velocities in the left ICA are consistent with a 40-59% stenosis. Vertebrals:  Bilateral vertebral arteries demonstrate antegrade flow. Subclavians: Normal flow hemodynamics were seen in bilateral subclavian              arteries. *See table(s) above for measurements and observations.  Electronically signed by Leotis Pain MD on 01/20/2018 at 1:33:38 PM.    Final    Ct Angio Abd/pel W/ And/or W/o  Result Date: 02/16/2018 CLINICAL DATA:  AAA surgical planning Hx of chole Hx of prostate  cancer^12mL OMNIPAQUE IOHEXOL 350 MG/ML SOLN EXAM: CTA  ABDOMEN AND PELVIS WITH CONTRAST TECHNIQUE: Multidetector CT imaging of the abdomen and pelvis was performed using the standard protocol during bolus administration of intravenous contrast. Multiplanar reconstructed images and MIPs were obtained and reviewed to evaluate the vascular anatomy. CONTRAST:  62mL OMNIPAQUE IOHEXOL 350 MG/ML SOLN COMPARISON:  06/21/2016 and previous FINDINGS: VASCULAR Coronary calcifications. Aorta: Eccentric nonocclusive mural thrombus in the visualized distal descending thoracic segment. Partially calcified atheromatous plaque in the visualized distal descending thoracic and suprarenal segments. Ectatic 3 cm juxtarenal segment. 5 x 4.5 cm fusiform infrarenal aneurysm, tapering to a diameter of 2.6 cm above the bifurcation. There is a moderate amount of eccentric nonocclusive mural thrombus in the aneurysmal segment. No dissection or stenosis. Celiac: Partially calcified ostial plaque resulting in short segment mild stenosis, patent distally. SMA: Calcified ostial plaque resulting in short segment mild stenosis, atheromatous but patent distally with classic distal branch anatomy. Renals: Single bilaterally. Partially calcified ostial plaque resulting in short segment stenoses, right worse than left. Both vessels patent distally. IMA: Patent without evidence of aneurysm, dissection, vasculitis or significant stenosis. Inflow: Fusiform ectasia of the right common iliac up to 1.5 cm diameter, left 1.6 cm. High-grade origin stenosis of the right hypogastric artery. Chronic long segment occlusion of the distal left common iliac, internal and external iliac arteries, with patent graft from the proximal common iliac to the common femoral artery. Calcified atheromatous plaque through the right external iliac without high-grade stenosis. Mild tortuosity of the native iliac arterial system. Proximal Outflow: Short-segment stenosis in the proximal right SFA, of possible hemodynamic significance.  Veins: Patent hepatic veins and portal vein, superior mesenteric vein, splenic vein, bilateral renal veins, and IVC. No venous pathology identified. Review of the MIP images confirms the above findings. NON-VASCULAR Lower chest: No acute abnormality. Hepatobiliary: No focal liver abnormality is seen. Status post cholecystectomy. No biliary dilatation. Pancreas: Mild atrophy without mass or ductal dilatation. Spleen: Normal in size without focal abnormality. Adrenals/Urinary Tract: Left adrenal fullness, stable since 10/02/2010. right adrenal unremarkable. Stable renal cysts. No hydronephrosis. Urinary bladder incompletely distended. Stomach/Bowel: Stomach and small bowel are decompressed. Moderate proximal colonic fecal material without dilatation, decompressed distally. Lymphatic: No abdominal or pelvic adenopathy. Reproductive: Prostate is unremarkable. Other: No ascites. No free air. Musculoskeletal: Previous instrumented fusion L4-S1. Adjacent level degenerative disc disease L3-4. Negative for fracture or worrisome bone lesion. IMPRESSION: VASCULAR 1. 5cm fusiform infrarenal abdominal aortic aneurysm. 2. Patent left iliac arterial graft. 3. 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 4. Short segment right proximal SFA stenosis of possible hemodynamic significance. NON-VASCULAR 1. No acute findings. Electronically Signed   By: Lucrezia Europe M.D.   On: 02/16/2018 15:15    Assessment/Plan Hypertension blood pressure control important in reducing the progression of atherosclerotic disease. On appropriate oral medications.   Hypercholesteremia lipid control important in reducing the progression of atherosclerotic disease. Continue statin therapy  AAA (abdominal aortic aneurysm) without rupture ([place])  CT scan which I have reviewed.  This demonstrates an approximately 5 cm abdominal aortic aneurysm which begins about a centimeter to a centimeter and a half below the renal arteries.  There is some  mural thrombus in the infrarenal neck which measures anywhere from 28 to 30 mm in diameter.  There also appears to be a significant right renal artery stenosis with the right renal artery being the lower of the 2 vessels.  Recommend: The aneurysm is > 5 cm and therefore should undergo  repair. Patient is status post CT scan of the abdominal aorta. The patient is a candidate for endovascular repair.   He will require cardiac clearance.   The patient will continue antiplatelet therapy as prescribed (since the patient is undergoing endovascular repair as opposed to open repair) as well as aggressive management of hyperlipidemia. Exercise is again strongly encouraged.   The patient is reminded that lifetime routine surveillance is a necessity with an endograft.   The risks and benefits of AAA repair are reviewed with the patient.  All questions are answered.  Alternative therapies are also discussed.  The patient agrees to proceed with endovascular aneurysm repair.  Patient will follow-up with me in the office after the surgery.    Leotis Pain, MD  02/17/2018 1:18 PM    This note was created with Dragon medical transcription system.  Any errors from dictation are purely unintentional

## 2018-02-17 NOTE — Patient Instructions (Signed)
Abdominal Aortic Aneurysm Endograft Repair  Abdominal aortic aneurysm endograft repair is a surgery to fix an aortic aneurysm in the abdominal area. An aneurysm is a weak or damaged part of an artery wall that bulges out from the normal force of blood pumping through the body. An abdominal aortic aneurysm is an aneurysm that happens in the lower part of the aorta, which is the main artery of the body. The repair is often done if the aneurysm gets so large that it might burst (rupture). A ruptured aneurysm would cause bleeding inside the body that could put a person's life in danger. Before that happens, this procedure is needed to fix the problem. The procedure may also be done if the aneurysm causes symptoms such as pain in the back, abdomen, or side. In this procedure, a tube made of fabric and metal mesh (endograft or stent-graft) is placed in the weak part of the aorta to repair it. Tell a health care provider about:  Any allergies you have.  All medicines you are taking, including vitamins, herbs, eye drops, creams, and over-the-counter medicines.  Any problems you or family members have had with anesthetic medicines.  Any blood disorders you have.  Any surgeries you have had.  Any medical conditions you have.  Whether you are pregnant or may be pregnant. What are the risks? Generally, this is a safe procedure. However, problems may occur, including:  Infection of the graft or incision area.  Bleeding during the procedure or from the incision site.  Allergic reactions to medicines.  Damage to other structures or organs.  Blood leaking out around the endograft.  The endograft moving from where it was placed during surgery.  Blood flow through the graft becoming blocked.  Blood clots.  Kidney problems.  Blood flow to the legs becoming blocked (rare).  Rupture of the aorta even after the endograft repair is a success (rare). What happens before the procedure? Staying  hydrated Follow instructions from your health care provider about hydration, which may include:  Up to 2 hours before the procedure - you may continue to drink clear liquids, such as water, clear fruit juice, black coffee, and plain tea. Eating and drinking restrictions Follow instructions from your health care provider about eating and drinking, which may include:  8 hours before the procedure - stop eating heavy meals or foods such as meat, fried foods, or fatty foods.  6 hours before the procedure - stop eating light meals or foods, such as toast or cereal.  6 hours before the procedure - stop drinking milk or drinks that contain milk.  2 hours before the procedure - stop drinking clear liquids. Medicines  Ask your health care provider about: ? Changing or stopping your regular medicines. This is especially important if you are taking diabetes medicines or blood thinners. ? Taking medicines such as aspirin and ibuprofen. These medicines can thin your blood. Do not take these medicines before your procedure if your health care provider instructs you not to.  You may be given antibiotic medicine to help prevent infection. General instructions  You may need to have blood tests, a test to check heart rhythm (electrocardiogram, or ECG), or a test to check blood flow (angiogram) before the surgery.  Imaging tests will be done to check the size and location of the aneurysm. These tests could include an ultrasound, a CT scan, or an MRI.  Do not use any products that contain nicotine or tobacco-such as cigarettes and e-cigarettes-for as   long as possible before the surgery. If you need help quitting, ask your health care provider.  Ask your health care provider how your surgical site will be marked or identified.  Plan to have someone take you home from the hospital or clinic. What happens during the procedure?  To reduce your risk of infection: ? Your health care team will wash or  sanitize their hands. ? Your skin will be washed with soap. ? Hair may be removed from the surgical area.  An IV tube will be inserted into one of your veins.  You will be given one or more of the following: ? A medicine to help you relax (sedative). ? A medicine to numb the area (local anesthetic). ? A medicine to make you fall asleep (general anesthetic). ? A medicine that is injected into an area of your body to numb everything below the injection site (regional anesthetic).  During the surgery: ? Small incisions or a puncture will be made on one or both sides of the groin. Long, thin tubes (catheters) will be passed through the opening, put into the artery in your thigh, and moved up into the aneurysm in the aorta. ? The health care provider will use live X-ray pictures to guide the endograft through the catheterto the place where the aneurysm is. ? The endograft will be released to seal off the aneurysm and to line the aorta. It will keep blood from flowing into the aneurysm and will help keep it from rupturing. The endograft will stay in place and will not be taken out. ? X-rays will be used to check where the endograft is placed and to make sure that it is where it should be. ? The catheter will be taken out, and the incision will be closed with stitches (sutures). The procedure may vary among health care providers and hospitals. What happens after the procedure?  Your blood pressure, heart rate, breathing rate, and blood oxygen level will be monitored until the medicines you were given have worn off.  You will need to lie flat for a number of hours. Bending your legs can cause them to bleed and swell.  You will then be urged to get up and move around a number of times each day and to slowly become more active.  You will be given medicines to control pain.  Certain tests may be done after your procedure to check how well the endograft is working and to check its placement.  Do  not drive for 24 hours if you received a sedative. This information is not intended to replace advice given to you by your health care provider. Make sure you discuss any questions you have with your health care provider. Document Released: 06/06/2008 Document Revised: 08/08/2015 Document Reviewed: 04/14/2015 Elsevier Interactive Patient Education  2019 [employer]  

## 2018-02-17 NOTE — Assessment & Plan Note (Signed)
CT scan which I have reviewed.  This demonstrates an approximately 5 cm abdominal aortic aneurysm which begins about a centimeter to a centimeter and a half below the renal arteries.  There is some mural thrombus in the infrarenal neck which measures anywhere from 28 to 30 mm in diameter.  There also appears to be a significant right renal artery stenosis with the right renal artery being the lower of the 2 vessels.  Recommend: The aneurysm is > 5 cm and therefore should undergo repair. Patient is status post CT scan of the abdominal aorta. The patient is a candidate for endovascular repair.   He will require cardiac clearance.   The patient will continue antiplatelet therapy as prescribed (since the patient is undergoing endovascular repair as opposed to open repair) as well as aggressive management of hyperlipidemia. Exercise is again strongly encouraged.   The patient is reminded that lifetime routine surveillance is a necessity with an endograft.   The risks and benefits of AAA repair are reviewed with the patient.  All questions are answered.  Alternative therapies are also discussed.  The patient agrees to proceed with endovascular aneurysm repair.  Patient will follow-up with me in the office after the surgery.

## 2018-02-24 DIAGNOSIS — E7800 Pure hypercholesterolemia, unspecified: Secondary | ICD-10-CM | POA: Diagnosis not present

## 2018-02-24 DIAGNOSIS — I10 Essential (primary) hypertension: Secondary | ICD-10-CM | POA: Diagnosis not present

## 2018-02-24 DIAGNOSIS — I25708 Atherosclerosis of coronary artery bypass graft(s), unspecified, with other forms of angina pectoris: Secondary | ICD-10-CM | POA: Diagnosis not present

## 2018-02-24 DIAGNOSIS — I739 Peripheral vascular disease, unspecified: Secondary | ICD-10-CM | POA: Diagnosis not present

## 2018-02-24 DIAGNOSIS — Z01818 Encounter for other preprocedural examination: Secondary | ICD-10-CM | POA: Diagnosis not present

## 2018-02-24 DIAGNOSIS — I714 Abdominal aortic aneurysm, without rupture: Secondary | ICD-10-CM | POA: Diagnosis not present

## 2018-03-01 ENCOUNTER — Other Ambulatory Visit: Payer: Self-pay | Admitting: Family Medicine

## 2018-03-01 MED ORDER — OXYCODONE HCL 10 MG PO TABS: 10.0000 mg | ORAL_TABLET | Freq: Four times a day (QID) | ORAL | 0 refills | Status: DC

## 2018-03-01 NOTE — Telephone Encounter (Signed)
Pt needing a refill on:  Oxycodone HCl 10 MG TABS  Please fill at:  [id number], [place] [address] AT [place] [phone] (Phone) [phone] (Fax)   Thanks, [employer]

## 2018-03-07 DIAGNOSIS — I25708 Atherosclerosis of coronary artery bypass graft(s), unspecified, with other forms of angina pectoris: Secondary | ICD-10-CM | POA: Diagnosis not present

## 2018-03-07 DIAGNOSIS — I714 Abdominal aortic aneurysm, without rupture: Secondary | ICD-10-CM | POA: Diagnosis not present

## 2018-03-20 ENCOUNTER — Telehealth (INDEPENDENT_AMBULATORY_CARE_PROVIDER_SITE_OTHER): Payer: Self-pay | Admitting: Vascular Surgery

## 2018-03-20 NOTE — Telephone Encounter (Signed)
I spoke with the patient's wife earlier and let her know what was going on as far as [facility] sending a clearance. Mardene Celeste at [facility] earlier to find out if the patient was cleared and she was looking into it.

## 2018-03-22 ENCOUNTER — Other Ambulatory Visit (INDEPENDENT_AMBULATORY_CARE_PROVIDER_SITE_OTHER): Payer: Self-pay | Admitting: Vascular Surgery

## 2018-03-22 ENCOUNTER — Encounter (INDEPENDENT_AMBULATORY_CARE_PROVIDER_SITE_OTHER): Payer: Self-pay

## 2018-03-27 ENCOUNTER — Encounter
Admission: RE | Admit: 2018-03-27 | Discharge: 2018-03-27 | Disposition: A | Discharge status: Home / Self Care | Payer: PPO | Source: Ambulatory Visit | Attending: Vascular Surgery | Admitting: Vascular Surgery

## 2018-03-27 ENCOUNTER — Other Ambulatory Visit: Payer: Self-pay

## 2018-03-27 DIAGNOSIS — I2510 Atherosclerotic heart disease of native coronary artery without angina pectoris: Secondary | ICD-10-CM | POA: Diagnosis present

## 2018-03-27 DIAGNOSIS — Z01810 Encounter for preprocedural cardiovascular examination: Secondary | ICD-10-CM | POA: Diagnosis not present

## 2018-03-27 DIAGNOSIS — Z01818 Encounter for other preprocedural examination: Secondary | ICD-10-CM | POA: Insufficient documentation

## 2018-03-27 DIAGNOSIS — Z7982 Long term (current) use of aspirin: Secondary | ICD-10-CM | POA: Diagnosis not present

## 2018-03-27 DIAGNOSIS — Z9049 Acquired absence of other specified parts of digestive tract: Secondary | ICD-10-CM | POA: Diagnosis not present

## 2018-03-27 DIAGNOSIS — Z9079 Acquired absence of other genital organ(s): Secondary | ICD-10-CM | POA: Diagnosis not present

## 2018-03-27 DIAGNOSIS — Z888 Allergy status to other drugs, medicaments and biological substances status: Secondary | ICD-10-CM | POA: Diagnosis not present

## 2018-03-27 DIAGNOSIS — Z981 Arthrodesis status: Secondary | ICD-10-CM | POA: Diagnosis not present

## 2018-03-27 DIAGNOSIS — Z8249 Family history of ischemic heart disease and other diseases of the circulatory system: Secondary | ICD-10-CM | POA: Diagnosis not present

## 2018-03-27 DIAGNOSIS — I714 Abdominal aortic aneurysm, without rupture: Secondary | ICD-10-CM

## 2018-03-27 DIAGNOSIS — Z86718 Personal history of other venous thrombosis and embolism: Secondary | ICD-10-CM | POA: Diagnosis not present

## 2018-03-27 DIAGNOSIS — Z79891 Long term (current) use of opiate analgesic: Secondary | ICD-10-CM | POA: Diagnosis not present

## 2018-03-27 DIAGNOSIS — I10 Essential (primary) hypertension: Secondary | ICD-10-CM | POA: Diagnosis present

## 2018-03-27 DIAGNOSIS — E7800 Pure hypercholesterolemia, unspecified: Secondary | ICD-10-CM | POA: Diagnosis present

## 2018-03-27 DIAGNOSIS — F17210 Nicotine dependence, cigarettes, uncomplicated: Secondary | ICD-10-CM | POA: Diagnosis present

## 2018-03-27 DIAGNOSIS — I701 Atherosclerosis of renal artery: Secondary | ICD-10-CM | POA: Diagnosis present

## 2018-03-27 DIAGNOSIS — Z825 Family history of asthma and other chronic lower respiratory diseases: Secondary | ICD-10-CM | POA: Diagnosis not present

## 2018-03-27 DIAGNOSIS — Z79899 Other long term (current) drug therapy: Secondary | ICD-10-CM | POA: Diagnosis not present

## 2018-03-27 DIAGNOSIS — Z8546 Personal history of malignant neoplasm of prostate: Secondary | ICD-10-CM | POA: Diagnosis not present

## 2018-03-27 DIAGNOSIS — M1990 Unspecified osteoarthritis, unspecified site: Secondary | ICD-10-CM | POA: Diagnosis present

## 2018-03-27 LAB — CBC WITH DIFFERENTIAL/PLATELET
Abs Immature Granulocytes: 0.04 10*3/uL (ref 0.00–0.07)
BASOS ABS: 0.1 10*3/uL (ref 0.0–0.1)
Basophils Relative: 1 %
EOS PCT: 3 %
Eosinophils Absolute: 0.3 10*3/uL (ref 0.0–0.5)
HCT: 42.9 % (ref 39.0–52.0)
Hemoglobin: 14.6 g/dL (ref 13.0–17.0)
Immature Granulocytes: 0 %
Lymphocytes Relative: 24 %
Lymphs Abs: 2.5 10*3/uL (ref 0.7–4.0)
MCH: 31.6 pg (ref 26.0–34.0)
MCHC: 34 g/dL (ref 30.0–36.0)
MCV: 92.9 fL (ref 80.0–100.0)
Monocytes Absolute: 1 10*3/uL (ref 0.1–1.0)
Monocytes Relative: 9 %
NRBC: 0 % (ref 0.0–0.2)
Neutro Abs: 6.5 10*3/uL (ref 1.7–7.7)
Neutrophils Relative %: 63 %
Platelets: 172 10*3/uL (ref 150–400)
RBC: 4.62 MIL/uL (ref 4.22–5.81)
RDW: 12.8 % (ref 11.5–15.5)
WBC: 10.3 10*3/uL (ref 4.0–10.5)

## 2018-03-27 LAB — TYPE AND SCREEN
ABO/RH(D): A POS
Antibody Screen: NEGATIVE

## 2018-03-27 LAB — BASIC METABOLIC PANEL
Anion gap: 6 (ref 5–15)
BUN: 19 mg/dL (ref 8–23)
CO2: 32 mmol/L (ref 22–32)
Calcium: 9 mg/dL (ref 8.9–10.3)
Chloride: 104 mmol/L (ref 98–111)
Creatinine, Ser: 1.27 mg/dL — ABNORMAL HIGH (ref 0.61–1.24)
GFR calc non Af Amer: 56 mL/min — ABNORMAL LOW (ref >60)
Glucose, Bld: 107 mg/dL — ABNORMAL HIGH (ref 70–99)
Potassium: 3.2 mmol/L — ABNORMAL LOW (ref 3.5–5.1)
Sodium: 142 mmol/L (ref 135–145)

## 2018-03-27 LAB — SURGICAL PCR SCREEN
MRSA, PCR: NEGATIVE
Staphylococcus aureus: NEGATIVE

## 2018-03-27 LAB — PROTIME-INR
INR: 1
Prothrombin Time: 12.6 seconds (ref 11.4–15.2)

## 2018-03-27 LAB — APTT: aPTT: 28 seconds (ref 24–36)

## 2018-03-27 NOTE — Pre-Procedure Instructions (Signed)
Abnormal K+ level faxed to Dr Bunnie Domino office. Will redraw DOS.

## 2018-03-27 NOTE — Patient Instructions (Signed)
Your procedure is scheduled on: Wednesday 03/29/2018 Report to [place]. To find out your arrival time please call [phone] between 1PM - 3PM on Tuesday 03/28/2018.  Remember: Instructions that are not followed completely may result in serious medical risk, up to and including death, or upon the discretion of your surgeon and anesthesiologist your surgery may need to be rescheduled.     _X__ 1. Do not eat food after midnight the night before your procedure.                 No gum chewing or hard candies. You may drink clear liquids up to 2 hours                 before you are scheduled to arrive for your surgery- DO not drink clear                 liquids within 2 hours of the start of your surgery.                 Clear Liquids include:  water, apple juice without pulp, clear carbohydrate                 drink such as Clearfast or Gatorade, Black Coffee or Tea (Do not add                 anything to coffee or tea).  __X__2.  On the morning of surgery brush your teeth with toothpaste and water, you                 may rinse your mouth with mouthwash if you wish.  Do not swallow any              toothpaste of mouthwash.     _X__ 3.  No Alcohol for 24 hours before or after surgery.   _X__ 4.  Do Not Smoke or use e-cigarettes For 24 Hours Prior to Your Surgery.                 Do not use any chewable tobacco products for at least 6 hours prior to                 surgery.  ____  5.  Bring all medications with you on the day of surgery if instructed.   __X__  6.  Notify your doctor if there is any change in your medical condition      (cold, fever, infections).     Do not wear jewelry, make-up, hairpins, clips or nail polish. Do not wear lotions, powders, or perfumes.  Do not shave 48 hours prior to surgery. Men may shave face and neck. Do not bring valuables to the hospital.    [facility] is not responsible for any belongings  or valuables.  Contacts, dentures/partials or body piercings may not be worn into surgery. Bring a case for your contacts, glasses or hearing aids, a denture cup will be supplied. Leave your suitcase in the car. After surgery it may be brought to your room. For patients admitted to the hospital, discharge time is determined by your treatment team.   Patients discharged the day of surgery will not be allowed to drive home.   Please read over the following fact sheets that you were given:   MRSA Information  __X__ Take these medicines the morning of surgery with A SIP OF WATER:  1. NONE  2.   3.   4.  5.  6.  ____ Fleet Enema (as directed)   __X__ Use CHG Soap/SAGE wipes as directed  ____ Use inhalers on the day of surgery  ____ Stop metformin/Janumet/Farxiga 2 days prior to surgery    ____ Take 1/2 of usual insulin dose the night before surgery. No insulin the morning          of surgery.   ____ Stop Blood Thinners Coumadin/Plavix/Xarelto/Pleta/Pradaxa/Eliquis/Effient/Aspirin  on   Or contact your Surgeon, Cardiologist or Medical Doctor regarding  ability to stop your blood thinners  __X__ Stop Anti-inflammatories 7 days before surgery such as Advil, Ibuprofen, Motrin,  BC or Goodies Powder, Naprosyn, Naproxen, Aleve   __X__ Stop all herbal supplements, fish oil or vitamin E until after surgery.    ____ Bring C-Pap to the hospital.

## 2018-03-28 MED ORDER — LACTATED RINGERS IV SOLN: INTRAVENOUS | Status: DC

## 2018-03-28 MED ORDER — CEFAZOLIN SODIUM-DEXTROSE 2-4 GM/100ML-% IV SOLN
2.0000 g | INTRAVENOUS | Status: AC
  Administered 2018-03-29: 2 g via INTRAVENOUS

## 2018-03-29 ENCOUNTER — Encounter
Admission: RE | Disposition: A | Discharge status: Home / Self Care | Payer: Self-pay | Source: Home / Self Care | Attending: Vascular Surgery

## 2018-03-29 ENCOUNTER — Other Ambulatory Visit: Payer: Self-pay

## 2018-03-29 ENCOUNTER — Inpatient Hospital Stay: Payer: PPO | Admitting: Registered Nurse

## 2018-03-29 ENCOUNTER — Inpatient Hospital Stay
Admission: RE | Admit: 2018-03-29 | Discharge: 2018-03-30 | DRG: 269 | Disposition: A | Discharge status: Home / Self Care | Payer: PPO | Attending: Vascular Surgery | Admitting: Vascular Surgery

## 2018-03-29 DIAGNOSIS — Z8249 Family history of ischemic heart disease and other diseases of the circulatory system: Secondary | ICD-10-CM

## 2018-03-29 DIAGNOSIS — Z8546 Personal history of malignant neoplasm of prostate: Secondary | ICD-10-CM | POA: Diagnosis not present

## 2018-03-29 DIAGNOSIS — F17210 Nicotine dependence, cigarettes, uncomplicated: Secondary | ICD-10-CM | POA: Diagnosis present

## 2018-03-29 DIAGNOSIS — Z888 Allergy status to other drugs, medicaments and biological substances status: Secondary | ICD-10-CM

## 2018-03-29 DIAGNOSIS — Z79891 Long term (current) use of opiate analgesic: Secondary | ICD-10-CM | POA: Diagnosis not present

## 2018-03-29 DIAGNOSIS — I2510 Atherosclerotic heart disease of native coronary artery without angina pectoris: Secondary | ICD-10-CM | POA: Diagnosis present

## 2018-03-29 DIAGNOSIS — Z7982 Long term (current) use of aspirin: Secondary | ICD-10-CM

## 2018-03-29 DIAGNOSIS — I714 Abdominal aortic aneurysm, without rupture, unspecified: Secondary | ICD-10-CM | POA: Diagnosis present

## 2018-03-29 DIAGNOSIS — Z981 Arthrodesis status: Secondary | ICD-10-CM

## 2018-03-29 DIAGNOSIS — I701 Atherosclerosis of renal artery: Secondary | ICD-10-CM

## 2018-03-29 DIAGNOSIS — Z9079 Acquired absence of other genital organ(s): Secondary | ICD-10-CM | POA: Diagnosis not present

## 2018-03-29 DIAGNOSIS — E7800 Pure hypercholesterolemia, unspecified: Secondary | ICD-10-CM | POA: Diagnosis present

## 2018-03-29 DIAGNOSIS — Z825 Family history of asthma and other chronic lower respiratory diseases: Secondary | ICD-10-CM

## 2018-03-29 DIAGNOSIS — M1990 Unspecified osteoarthritis, unspecified site: Secondary | ICD-10-CM | POA: Diagnosis present

## 2018-03-29 DIAGNOSIS — Z86718 Personal history of other venous thrombosis and embolism: Secondary | ICD-10-CM

## 2018-03-29 DIAGNOSIS — I10 Essential (primary) hypertension: Secondary | ICD-10-CM | POA: Diagnosis present

## 2018-03-29 DIAGNOSIS — Z9049 Acquired absence of other specified parts of digestive tract: Secondary | ICD-10-CM | POA: Diagnosis not present

## 2018-03-29 DIAGNOSIS — Z79899 Other long term (current) drug therapy: Secondary | ICD-10-CM | POA: Diagnosis not present

## 2018-03-29 HISTORY — PX: ENDOVASCULAR REPAIR/STENT GRAFT: CATH118280

## 2018-03-29 LAB — ABO/RH: ABO/RH(D): A POS

## 2018-03-29 LAB — GLUCOSE, CAPILLARY: Glucose-Capillary: 103 mg/dL — ABNORMAL HIGH (ref 70–99)

## 2018-03-29 SURGERY — ENDOVASCULAR REPAIR/STENT GRAFT
Anesthesia: General

## 2018-03-29 MED ORDER — SODIUM CHLORIDE 0.9 % IV SOLN: 500.0000 mL | Freq: Once | INTRAVENOUS | Status: DC | PRN

## 2018-03-29 MED ORDER — FENTANYL CITRATE (PF) 100 MCG/2ML IJ SOLN: 25.0000 ug | INTRAMUSCULAR | Status: DC | PRN

## 2018-03-29 MED ORDER — LABETALOL HCL 5 MG/ML IV SOLN: 10.0000 mg | INTRAVENOUS | Status: DC | PRN

## 2018-03-29 MED ORDER — DOPAMINE-DEXTROSE 3.2-5 MG/ML-% IV SOLN: 3.0000 ug/kg/min | INTRAVENOUS | Status: DC

## 2018-03-29 MED ORDER — ACETAMINOPHEN 650 MG RE SUPP: 325.0000 mg | RECTAL | Status: DC | PRN

## 2018-03-29 MED ORDER — ONDANSETRON HCL 4 MG/2ML IJ SOLN: 4.0000 mg | Freq: Four times a day (QID) | INTRAMUSCULAR | Status: DC | PRN

## 2018-03-29 MED ORDER — OXYCODONE-ACETAMINOPHEN 5-325 MG PO TABS
1.0000 | ORAL_TABLET | ORAL | Status: DC | PRN
  Administered 2018-03-29 – 2018-03-30 (×3): 2 via ORAL
  Filled 2018-03-29 (×3): qty 2

## 2018-03-29 MED ORDER — HYDROCHLOROTHIAZIDE 25 MG PO TABS
25.0000 mg | ORAL_TABLET | Freq: Every day | ORAL | Status: DC
  Administered 2018-03-29: 25 mg via ORAL
  Filled 2018-03-29: qty 1

## 2018-03-29 MED ORDER — CHLORHEXIDINE GLUCONATE CLOTH 2 % EX PADS
6.0000 | MEDICATED_PAD | Freq: Once | CUTANEOUS | Status: AC
  Administered 2018-03-29: 6 via TOPICAL

## 2018-03-29 MED ORDER — PROPOFOL 10 MG/ML IV BOLUS
INTRAVENOUS | Status: DC | PRN
  Administered 2018-03-29: 130 mg via INTRAVENOUS

## 2018-03-29 MED ORDER — FAMOTIDINE 20 MG PO TABS
20.0000 mg | ORAL_TABLET | Freq: Once | ORAL | Status: AC
  Administered 2018-03-29: 20 mg via ORAL

## 2018-03-29 MED ORDER — OXYCODONE HCL 5 MG PO TABS: 5.0000 mg | ORAL_TABLET | Freq: Once | ORAL | Status: DC | PRN

## 2018-03-29 MED ORDER — SODIUM CHLORIDE 0.9 % IV SOLN
INTRAVENOUS | Status: DC
  Administered 2018-03-29: 16:00:00 via INTRAVENOUS

## 2018-03-29 MED ORDER — EPHEDRINE SULFATE 50 MG/ML IJ SOLN
INTRAMUSCULAR | Status: DC | PRN
  Administered 2018-03-29: 5 mg via INTRAVENOUS

## 2018-03-29 MED ORDER — FENTANYL CITRATE (PF) 250 MCG/5ML IJ SOLN
INTRAMUSCULAR | Status: AC
  Filled 2018-03-29: qty 5

## 2018-03-29 MED ORDER — METOPROLOL TARTRATE 5 MG/5ML IV SOLN: 2.0000 mg | INTRAVENOUS | Status: DC | PRN

## 2018-03-29 MED ORDER — SUGAMMADEX SODIUM 200 MG/2ML IV SOLN
INTRAVENOUS | Status: DC | PRN
  Administered 2018-03-29: 200 mg via INTRAVENOUS

## 2018-03-29 MED ORDER — MAGNESIUM SULFATE 2 GM/50ML IV SOLN: 2.0000 g | Freq: Every day | INTRAVENOUS | Status: DC | PRN

## 2018-03-29 MED ORDER — DEXAMETHASONE SODIUM PHOSPHATE 10 MG/ML IJ SOLN
INTRAMUSCULAR | Status: DC | PRN
  Administered 2018-03-29: 5 mg via INTRAVENOUS

## 2018-03-29 MED ORDER — NITROGLYCERIN IN D5W 200-5 MCG/ML-% IV SOLN: 5.0000 ug/min | INTRAVENOUS | Status: DC

## 2018-03-29 MED ORDER — TRAZODONE HCL 50 MG PO TABS: 50.0000 mg | ORAL_TABLET | Freq: Every evening | ORAL | Status: DC | PRN

## 2018-03-29 MED ORDER — HYDRALAZINE HCL 20 MG/ML IJ SOLN: 5.0000 mg | INTRAMUSCULAR | Status: DC | PRN

## 2018-03-29 MED ORDER — SODIUM CHLORIDE 0.9 % IV SOLN
INTRAVENOUS | Status: DC | PRN
  Administered 2018-03-29: 30 ug/min via INTRAVENOUS

## 2018-03-29 MED ORDER — FAMOTIDINE 20 MG PO TABS
ORAL_TABLET | ORAL | Status: AC
  Filled 2018-03-29: qty 1

## 2018-03-29 MED ORDER — PROPOFOL 10 MG/ML IV BOLUS
INTRAVENOUS | Status: AC
  Filled 2018-03-29: qty 20

## 2018-03-29 MED ORDER — ACETAMINOPHEN 325 MG PO TABS: 325.0000 mg | ORAL_TABLET | ORAL | Status: DC | PRN

## 2018-03-29 MED ORDER — SODIUM CHLORIDE 0.9 % IV SOLN
INTRAVENOUS | Status: DC
  Administered 2018-03-29: 09:00:00 via INTRAVENOUS

## 2018-03-29 MED ORDER — MORPHINE SULFATE (PF) 2 MG/ML IV SOLN: 2.0000 mg | INTRAVENOUS | Status: DC | PRN

## 2018-03-29 MED ORDER — GUAIFENESIN-DM 100-10 MG/5ML PO SYRP: 15.0000 mL | ORAL_SOLUTION | ORAL | Status: DC | PRN

## 2018-03-29 MED ORDER — OXYCODONE HCL 5 MG/5ML PO SOLN
5.0000 mg | Freq: Once | ORAL | Status: DC | PRN
  Filled 2018-03-29: qty 5

## 2018-03-29 MED ORDER — SUCCINYLCHOLINE CHLORIDE 20 MG/ML IJ SOLN
INTRAMUSCULAR | Status: DC | PRN
  Administered 2018-03-29: 140 mg via INTRAVENOUS

## 2018-03-29 MED ORDER — ASPIRIN EC 81 MG PO TBEC
81.0000 mg | DELAYED_RELEASE_TABLET | Freq: Every day | ORAL | Status: DC
  Administered 2018-03-29: 81 mg via ORAL
  Filled 2018-03-29: qty 1

## 2018-03-29 MED ORDER — FAMOTIDINE IN NACL 20-0.9 MG/50ML-% IV SOLN
20.0000 mg | Freq: Two times a day (BID) | INTRAVENOUS | Status: DC
  Administered 2018-03-29: 20 mg via INTRAVENOUS
  Filled 2018-03-29: qty 50

## 2018-03-29 MED ORDER — POTASSIUM CHLORIDE CRYS ER 20 MEQ PO TBCR: 20.0000 meq | EXTENDED_RELEASE_TABLET | Freq: Every day | ORAL | Status: DC | PRN

## 2018-03-29 MED ORDER — IOHEXOL 300 MG/ML  SOLN
INTRAMUSCULAR | Status: DC | PRN
  Administered 2018-03-29: 110 mL via INTRAVENOUS

## 2018-03-29 MED ORDER — CEFAZOLIN SODIUM-DEXTROSE 2-4 GM/100ML-% IV SOLN
2.0000 g | Freq: Three times a day (TID) | INTRAVENOUS | Status: AC
  Administered 2018-03-29 (×2): 2 g via INTRAVENOUS
  Filled 2018-03-29 (×2): qty 100

## 2018-03-29 MED ORDER — ATORVASTATIN CALCIUM 80 MG PO TABS
80.0000 mg | ORAL_TABLET | Freq: Every day | ORAL | Status: DC
  Administered 2018-03-29: 80 mg via ORAL
  Filled 2018-03-29: qty 4
  Filled 2018-03-29 (×2): qty 1

## 2018-03-29 MED ORDER — LIDOCAINE HCL (CARDIAC) PF 100 MG/5ML IV SOSY
PREFILLED_SYRINGE | INTRAVENOUS | Status: DC | PRN
  Administered 2018-03-29: 100 mg via INTRAVENOUS

## 2018-03-29 MED ORDER — SUCCINYLCHOLINE CHLORIDE 20 MG/ML IJ SOLN
INTRAMUSCULAR | Status: AC
  Filled 2018-03-29: qty 1

## 2018-03-29 MED ORDER — PHENOL 1.4 % MT LIQD
1.0000 | OROMUCOSAL | Status: DC | PRN
  Filled 2018-03-29: qty 177

## 2018-03-29 MED ORDER — DOCUSATE SODIUM 100 MG PO CAPS: 100.0000 mg | ORAL_CAPSULE | Freq: Every day | ORAL | Status: DC

## 2018-03-29 MED ORDER — SUGAMMADEX SODIUM 200 MG/2ML IV SOLN
INTRAVENOUS | Status: AC
  Filled 2018-03-29: qty 2

## 2018-03-29 MED ORDER — ORAL CARE MOUTH RINSE
15.0000 mL | Freq: Two times a day (BID) | OROMUCOSAL | Status: DC
  Administered 2018-03-29: 15 mL via OROMUCOSAL

## 2018-03-29 MED ORDER — LIDOCAINE HCL (PF) 2 % IJ SOLN
INTRAMUSCULAR | Status: AC
  Filled 2018-03-29: qty 10

## 2018-03-29 MED ORDER — ROCURONIUM BROMIDE 50 MG/5ML IV SOLN
INTRAVENOUS | Status: AC
  Filled 2018-03-29: qty 1

## 2018-03-29 MED ORDER — ALUM & MAG HYDROXIDE-SIMETH 200-200-20 MG/5ML PO SUSP: 15.0000 mL | ORAL | Status: DC | PRN

## 2018-03-29 MED ORDER — ONDANSETRON HCL 4 MG/2ML IJ SOLN
INTRAMUSCULAR | Status: AC
  Filled 2018-03-29: qty 2

## 2018-03-29 MED ORDER — LACTATED RINGERS IV SOLN
INTRAVENOUS | Status: DC | PRN
  Administered 2018-03-29: 10:00:00 via INTRAVENOUS

## 2018-03-29 MED ORDER — CEFAZOLIN SODIUM-DEXTROSE 2-4 GM/100ML-% IV SOLN
INTRAVENOUS | Status: AC
  Filled 2018-03-29: qty 100

## 2018-03-29 MED ORDER — FENTANYL CITRATE (PF) 100 MCG/2ML IJ SOLN
INTRAMUSCULAR | Status: DC | PRN
  Administered 2018-03-29: 50 ug via INTRAVENOUS
  Administered 2018-03-29: 100 ug via INTRAVENOUS

## 2018-03-29 MED ORDER — HEPARIN SODIUM (PORCINE) 1000 UNIT/ML IJ SOLN
INTRAMUSCULAR | Status: DC | PRN
  Administered 2018-03-29: 5000 [IU] via INTRAVENOUS

## 2018-03-29 MED ORDER — DEXAMETHASONE SODIUM PHOSPHATE 10 MG/ML IJ SOLN
INTRAMUSCULAR | Status: AC
  Filled 2018-03-29: qty 1

## 2018-03-29 MED ORDER — ROCURONIUM BROMIDE 100 MG/10ML IV SOLN
INTRAVENOUS | Status: DC | PRN
  Administered 2018-03-29: 35 mg via INTRAVENOUS
  Administered 2018-03-29: 5 mg via INTRAVENOUS
  Administered 2018-03-29 (×2): 10 mg via INTRAVENOUS

## 2018-03-29 SURGICAL SUPPLY — 33 items
BALLN ULTRVRSE PTA 5X40X75C (BALLOONS) ×3
BALLOON ULTRVRSE PTA 5X40X75C (BALLOONS) ×1 IMPLANT
CANNULA 5F STIFF (CANNULA) ×6 IMPLANT
CATH ACCU-VU SIZ PIG 5F 70CM (CATHETERS) ×3 IMPLANT
CATH BALLN CODA 9X100X32 (BALLOONS) ×3 IMPLANT
CATH BEACON 5 .035 65 KMP TIP (CATHETERS) ×3 IMPLANT
CATH VS15FR (CATHETERS) ×3 IMPLANT
DEVICE CLOSURE PERCLS PRGLD 6F (VASCULAR PRODUCTS) ×4 IMPLANT
DEVICE PRESTO INFLATION (MISCELLANEOUS) ×3 IMPLANT
DEVICE SAFEGUARD 24CM (GAUZE/BANDAGES/DRESSINGS) ×6 IMPLANT
DEVICE TORQUE .025-.038 (MISCELLANEOUS) ×3 IMPLANT
DRYSEAL FLEXSHEATH 12FR 33CM (SHEATH) ×2
DRYSEAL FLEXSHEATH 18FR 33CM (SHEATH) ×2
EXCLUDER TNK LEG 31MX14X15 (Endovascular Graft) ×1 IMPLANT
EXCLUDER TRUNK LEG 31MX14X15 (Endovascular Graft) ×3 IMPLANT
GLIDEWIRE STIFF .35X180X3 HYDR (WIRE) ×3 IMPLANT
GRAFT EXCLUDER LEG 12X14 (Endovascular Graft) ×3 IMPLANT
PACK ANGIOGRAPHY (CUSTOM PROCEDURE TRAY) ×3 IMPLANT
PERCLOSE PROGLIDE 6F (VASCULAR PRODUCTS) ×12
SET INTRO CAPELLA COAXIAL (SET/KITS/TRAYS/PACK) ×3 IMPLANT
SHEATH  FLEX ANSEL 8FRX45 (SHEATH) ×2
SHEATH BRITE TIP 6FRX11 (SHEATH) ×6 IMPLANT
SHEATH BRITE TIP 8FRX11 (SHEATH) ×6 IMPLANT
SHEATH DRYSEAL FLEX 12FR 33CM (SHEATH) ×1 IMPLANT
SHEATH DRYSEAL FLEX 18FR 33CM (SHEATH) ×1 IMPLANT
SHEATH FLEX ANSEL 8FRX45 (SHEATH) ×1 IMPLANT
SPONGE XRAY 4X4 16PLY STRL (MISCELLANEOUS) ×3 IMPLANT
STENT LIFESTREAM 7X26X80 (Permanent Stent) ×3 IMPLANT
SYR MEDRAD MARK V 150ML (SYRINGE) ×3 IMPLANT
TUBING CONTRAST HIGH PRESS 72 (TUBING) ×3 IMPLANT
WIRE AMPLATZ SSTIFF .035X260CM (WIRE) ×6 IMPLANT
WIRE J 3MM .035X145CM (WIRE) ×6 IMPLANT
WIRE MAGIC TORQUE 260C (WIRE) ×3 IMPLANT

## 2018-03-29 NOTE — Transfer of Care (Signed)
Immediate Anesthesia Transfer of Care Note  Patient: Tillman Abide III  Procedure(s) Performed: ENDOVASCULAR REPAIR/STENT GRAFT (N/A )  Patient Location: PACU  Anesthesia Type:General  Level of Consciousness: awake and alert   Airway & Oxygen Therapy: Patient Spontanous Breathing and Patient connected to face mask oxygen  Post-op Assessment: Report given to RN, Post -op Vital signs reviewed and stable and Patient moving all extremities  Post vital signs: Reviewed and stable  Last Vitals:  Vitals Value Taken Time  BP 163/73 03/29/2018 12:18 PM  Temp    Pulse 85 03/29/2018 12:19 PM  Resp 25 03/29/2018 12:19 PM  SpO2 100 % 03/29/2018 12:19 PM  Vitals shown include unvalidated device data.  Last Pain:  Vitals:   03/29/18 0832  TempSrc: Oral  PainSc: 3       Patients Stated Pain Goal: 3 (21/11/73 5670)  Complications: No apparent anesthesia complications

## 2018-03-29 NOTE — Anesthesia Postprocedure Evaluation (Signed)
Anesthesia Post Note  Patient: Justin Harrington  Procedure(s) Performed: ENDOVASCULAR REPAIR/STENT GRAFT (N/A )  Patient location during evaluation: PACU Anesthesia Type: General Level of consciousness: awake and alert Pain management: pain level controlled Vital Signs Assessment: post-procedure vital signs reviewed and stable Respiratory status: spontaneous breathing, nonlabored ventilation, respiratory function stable and patient connected to nasal cannula oxygen Cardiovascular status: blood pressure returned to baseline and stable Postop Assessment: no apparent nausea or vomiting Anesthetic complications: no     Last Vitals:  Vitals:   03/29/18 1303 03/29/18 1318  BP: (!) 151/71 (!) 151/71  Pulse: 88 81  Resp: 20 14  Temp:    SpO2: 92% 92%    Last Pain:  Vitals:   03/29/18 1318  TempSrc:   PainSc: 0-No pain                 Precious Haws Sephiroth Mcluckie

## 2018-03-29 NOTE — Anesthesia Procedure Notes (Addendum)
Procedure Name: Intubation Date/Time: 03/29/2018 10:15 AM Performed by: Lynnda Shields, RN Pre-anesthesia Checklist: Patient identified, Emergency Drugs available, Suction available, Patient being monitored and Timeout performed Patient Re-evaluated:Patient Re-evaluated prior to induction Oxygen Delivery Method: Circle system utilized Preoxygenation: Pre-oxygenation with 100% oxygen Induction Type: IV induction Ventilation: Oral airway inserted - appropriate to patient size and Mask ventilation without difficulty Laryngoscope Size: Mac and 4 Grade View: Grade I Tube type: Oral Tube size: 7.5 mm Number of attempts: 3 (unable to visualize cords with Sabra Heck 3 or Miller 2) Airway Equipment and Method: Stylet Placement Confirmation: ETT inserted through vocal cords under direct vision Secured at: 23 cm Tube secured with: Tape Dental Injury: Teeth and Oropharynx as per pre-operative assessment

## 2018-03-29 NOTE — Anesthesia Preprocedure Evaluation (Signed)
Anesthesia Evaluation  Patient identified by MRN, date of birth, ID band Patient awake    Reviewed: Allergy & Precautions, H&P , NPO status , Patient's Chart, lab work & pertinent test results  History of Anesthesia Complications Negative for: history of anesthetic complications  Airway Mallampati: III  TM Distance: >3 FB Neck ROM: limited    Dental  (+) Edentulous Upper, Edentulous Lower, Poor Dentition, Missing   Pulmonary COPD, Current Smoker,           Cardiovascular Exercise Tolerance: Good hypertension, (-) angina+ CAD  (-) Past MI and (-) DOE      Neuro/Psych  Neuromuscular disease negative psych ROS   GI/Hepatic negative GI ROS, Neg liver ROS, neg GERD  ,  Endo/Other  negative endocrine ROS  Renal/GU      Musculoskeletal  (+) Arthritis ,   Abdominal   Peds  Hematology negative hematology ROS (+)   Anesthesia Other Findings Past Medical History: No date: Aortic aneurysm (HCC) No date: Coronary artery disease No date: DVT (deep venous thrombosis) (HCC)     Comment:  H/O DVT in left leg, filter placed approximatly 8 yrs               ago 1998: H/O spinal fusion     Comment:  Back pain No date: Hypertension     Comment:  controlled on meds 06/12/2015: Personal history of tobacco use, presenting hazards to  health 06/12/2015: Personal history of tobacco use, presenting hazards to  health No date: Prostate cancer ([place])     Comment:  Had prostatectomy No date: Wears dentures     Comment:  upper  Past Surgical History: 1998: BACK SURGERY     Comment:  Lumbar spine fusion 10/05/2011: carotid doppler ultrasound     Comment:  75-90% RCA occlusion, 50% on left 12/02/2011: CAROTID ENDARTERECTOMY; Right     Comment:  Dr. Lucky Cowboy 10/02/2010: CHOLECYSTECTOMY     Comment:  Laparoscopic, Dr. Pat Patrick, [facility] 06/20/2017: COLONOSCOPY WITH PROPOFOL; N/A     Comment:  Procedure: COLONOSCOPY WITH PROPOFOL;  Surgeon: [name], MD;  Location: [facility];  Service:               Endoscopy;  Laterality: N/A; 10/02/2010: CT SCAN     Comment:  [facility]; Infrarenal suprailiac abdominal aortic aneurysm               maximum dimention of nearly 4cm. Cholelithiasis and acute              cholecystiis. Mild enlargement of left adrenal gland 07/30/2016: LUMBAR SPINE HARDWARE REMOVAL     Comment:  [name], MD 10/19/2012: PROSTATE SURGERY     Comment:  prostatectomy. [facility]; Laser assisted, Done by              [name] for prostate cancer  BMI    Body Mass Index:  23.09 kg/m      Reproductive/Obstetrics negative OB ROS                             Anesthesia Physical Anesthesia Plan  ASA: III  Anesthesia Plan: General ETT   Post-op Pain Management:    Induction: Intravenous  PONV Risk Score and Plan: Ondansetron, Dexamethasone, Midazolam and Treatment may vary due to age or medical condition  Airway Management Planned: Oral ETT  Additional Equipment:   Intra-op Plan:   Post-operative Plan: Extubation in OR  Informed Consent: I have reviewed the patients History and Physical, chart, labs and discussed the procedure including the risks, benefits and alternatives for the proposed anesthesia with the patient or authorized representative who has indicated his/her understanding and acceptance.     Dental Advisory Given  Plan Discussed with: Anesthesiologist, CRNA and Surgeon  Anesthesia Plan Comments: (Patient consented for risks of anesthesia including but not limited to:  - adverse reactions to medications - damage to teeth, lips or other oral mucosa - sore throat or hoarseness - Damage to heart, brain, lungs or loss of life  Patient voiced understanding.)        Anesthesia Quick Evaluation

## 2018-03-29 NOTE — Anesthesia Post-op Follow-up Note (Signed)
Anesthesia QCDR form completed.        

## 2018-03-29 NOTE — Op Note (Signed)
OPERATIVE NOTE   PROCEDURE: 1. US guidance for vascular access, bilateral femoral arteries 2. Catheter placement into aorta from bilateral femoral approaches 3. Placement of a C3 Gore Excluder Endoprosthesis  31 x 14 x 15 main body 4. Placement of a 12 x 14 contralateral limb with extension into the mid external iliac 5. Percutaneous transluminal angioplasty and stent placement right renal artery with a 7 mm x 26 mm lifestream stent 6. ProGlide closure devices bilateral femoral arteries  PRE-OPERATIVE DIAGNOSIS: AAA  POST-OPERATIVE DIAGNOSIS: same  SURGEON: Justin Pilar, MD and Leotis Pain, MD - Co-surgeons  ANESTHESIA: general  ESTIMATED BLOOD LOSS: Less than 50 cc  FINDING(S): 1.  AAA  SPECIMEN(S):  none  INDICATIONS:   Justin Harrington is a 73 y.o. y.o. male who presents with an abdominal aortic aneurysm which is now grown and exceeds 5.0 cm.  CT scan demonstrates anatomy suitable for endovascular reconstruction.  The risks and benefits for aneurysm repair been reviewed with the patient alternative therapies including open aneurysm repair has also been discussed.  All questions have been answered.  The patient wishes to proceed with stent graft repair of his abdominal aortic aneurysm to prevent lethal rupture.  DESCRIPTION: After obtaining full informed written consent, the patient was brought back to the operating room and placed supine upon the operating table.  The patient received IV antibiotics prior to induction.  After obtaining adequate anesthesia, the patient was prepped and draped in the standard fashion for endovascular AAA repair.  Co-surgeons are required because this is a complex bilateral procedure with work being performed simultaneously from both the right femoral and left femoral approach.  This also expedites the procedure making a shorter operative time reducing complications and improving patient safety.  We then began by gaining access to both femoral  arteries with US guidance with me working on the patient's left and Dr. Lucky Cowboy working on the patient's right.  The femoral arteries were found to be patent and accessed without difficulty with a needle under ultrasound guidance without difficulty on each side and permanent images were recorded.  We then placed 2 proglide devices on each side in a pre-close fashion and placed 8 French sheaths.  The patient was then given 5000 units of intravenous heparin.   The Pigtail catheter was placed into the aorta from the right side.  Aortogram was obtained which demonstrated a greater than 90% stenosis of the right renal artery and 60 to 70% stenosis of the left renal artery.  Given this finding we elected to treat the right renal critical stenosis.  The right sided 11 cm 8 French Pinnacle sheath was then exchanged for an 8 [place] multipurpose sheath.  V S1 catheter and Glidewire were used to select the right renal artery.  Hand-injection of contrast verified intraluminal placement and distal anatomy.  No significant abnormalities are noted within the right renal artery system beyond the ostial stenosis.  Magic torque wire was then advanced through the V S1 catheter and the V S1 catheter removed.  A 7 mm x 26 mm lifestream stent was then selected advanced across the lesion and deployed to 12 atm.  Follow-up imaging demonstrated wide patency of the stent with less than 5% residual stenosis.  Preservation of distal runoff.  Having successfully treated the renal artery stenosis we now moved forward with aneurysm repair.  Imaging of the abdominal aorta was again performed.  Using this image, we selected a 31 x 14 x 15 Main body device.  Over a stiff wire, an 38 French sheath was placed. The main body was then placed through the 18 French sheath. A Kumpe catheter was placed up the left side and a magnified image at the renal arteries was performed. The main body was then deployed just below the lowest renal artery. The Kumpe  catheter was used to cannulate the contralateral gate without difficulty and successful cannulation was confirmed by twirling the pigtail catheter in the main body.   We then placed a stiff wire and a retrograde arteriogram was performed through the left femoral sheath. We upsized to the 12 [place] sheath for the contralateral limb and a 12 x 14 limb was selected and deployed extending the limb into the mid external iliac.  This was required in order to cover all of the common iliac aneurysmal changes. The main body deployment was then completed. Based off the angiographic findings, further extension limbs were not necessary.  All junction points and seals zones were treated with the compliant balloon.   The pigtail catheter was then replaced and a completion angiogram was performed.   Type II endoleak was detected on completion angiography. The renal arteries were found to be widely patent.    At this point we elected to terminate the procedure. We secured the pro glide devices for hemostasis on the femoral arteries. The skin incision was closed with a 4-0 Monocryl. Dermabond and pressure dressing were placed. The patient was taken to the recovery room in stable condition having tolerated the procedure well.  COMPLICATIONS: none  CONDITION: stable  Justin Harrington  03/29/2018, 12:13 PM

## 2018-03-29 NOTE — Op Note (Signed)
OPERATIVE NOTE   PROCEDURE: 1. US guidance for vascular access, bilateral femoral arteries 2. Catheter placement into aorta from left femoral approach 3. Catheter placement into right renal artery from right femoral approach with selective right renal angiogram 4. Stent placement to the right renal artery with a 7 mm diameter by 26 mm length lifestream stent 5. Placement of a 31 mm diameter proximal 15 cm length Gore Excluder Endoprosthesis main body right  6. Placement of a 12 mm diameter by 14 cm length left iliac limb to the mid external iliac artery 7. ProGlide closure devices bilateral femoral arteries  PRE-OPERATIVE DIAGNOSIS: AAA, high-grade right renal artery stenosis, hypertension  POST-OPERATIVE DIAGNOSIS: same  SURGEON: Leotis Pain, MD and Hortencia Pilar, MD - Co-surgeons  ANESTHESIA: General  ESTIMATED BLOOD LOSS: 30 cc  FINDING(S): 1.  AAA, >90% right renal artery stenosis  SPECIMEN(S):  none  INDICATIONS:   Justin Harrington is a 73 y.o. male who presents with >5 cm AAA and high grade right renal artery stenosis which was the lower renal artery. The anatomy was suitable for endovascular repair.  Risks and benefits of repair in an endovascular fashion were discussed and informed consent was obtained. Co-surgeons are used to expedite the procedure and reduce operative time as bilateral work needs to be done.  DESCRIPTION: After obtaining full informed written consent, the patient was brought back to the operating room and placed supine upon the operating table.  The patient received IV antibiotics prior to induction.  After obtaining adequate anesthesia, the patient was prepped and draped in the standard fashion for endovascular AAA repair.  We then began by gaining access to both femoral arteries with US guidance with me working on the right and Dr. Delana Meyer working on the left.  The femoral arteries were found to be patent and accessed without difficulty with a  needle under ultrasound guidance without difficulty on each side and permanent images were recorded.  We then placed 2 proglide devices on each side in a pre-close fashion and placed 8 French sheaths. The patient was then given 5000 units of intravenous heparin. The Pigtail catheter was placed into the aorta from the right side.  Imaging demonstrated the known abdominal aortic aneurysm and a greater than 90% right renal artery stenosis which was the lower of the 2 renal arteries.  He did have hypertension and placement of a renal artery stent is appropriate to improve perfusion with hopes of improving his hypertension and maintaining renal function as well as protecting the right kidney with aortic stent graft placement.  Using a V S1 catheter I selectively cannulated the right renal artery and selective imaging was performed confirming the high-grade stenosis.  I then placed a Magic torque wire into the distal renal artery and advanced an 8 French sheath to the origin of the right renal artery.  This was a tight calcified lesion I predilated this with a 5 mm balloon prior to definitively treating it with a stent.  A 7 mm diameter by 26 mm length lifestream stent was then deployed in the renal artery encompassing the lesion with a very tight waist that did not resolve until about 12 atm.  Completion imaging through the sheath showed less than 10% residual stenosis with brisk flow through the renal artery.  This also showed [place] the landing zone for the stent graft which would be at the base of this right renal artery stent.  Using this image, we selected a 31 mm diameter  by 15 cm length Main body device.  Over a stiff wire, an 82 French sheath was placed up the right. The main body was then placed through the 18 French sheath. A Kumpe catheter was placed up the left side. The main body was then deployed just below the right renal artery stent which was the lowest renal artery. The Kumpe catheter was used to cannulate  the contralateral gate without difficulty and successful cannulation was confirmed by twirling the pigtail catheter in the main body. We then placed a stiff wire and a retrograde arteriogram was performed through the left femoral sheath. We upsized to the 12 [place] sheath for the contralateral limb and a 12 mm diameter by 14 cm length left iliac extension limb was selected and deployed.  This was taken down to the mid left external iliac artery to encompass the entirety of the these process.  The main body deployment was then completed. Based off the angiographic findings, further extension limbs were not necessary. All junction points and seals zones were treated with the compliant balloon. The pigtail catheter was then replaced and a completion angiogram was performed.  A type II endoleak was detected on completion angiography. The renal arteries were found to be widely patent.  The right hypogastric artery remained patent. At this point we elected to terminate the procedure. We secured the pro glide devices for hemostasis on the femoral arteries. The skin incision was closed with a 4-0 Monocryl. Dermabond and pressure dressing were placed. The patient was taken to the recovery room in stable condition having tolerated the procedure well.  COMPLICATIONS: none  CONDITION: stable  Leotis Pain  03/29/2018, 12:21 PM   This note was created with Dragon Medical transcription system. Any errors in dictation are purely unintentional.

## 2018-03-29 NOTE — H&P (Signed)
[facility] Admission History & Physical  MRN : [id number]  Justin Harrington is a 73 y.o. ([birth year]) male who presents with chief complaint of No chief complaint on file. Marland Kitchen  History of Present Illness: Patient presents for repair of his abdominal aortic aneurysm.  He is doing well and has no specific complaints or problems since his last visit in the office last month.  He has some chronic arthritic issues but no new back or abdominal pain.  No signs of peripheral embolization.  No fever or chills.  CT scan was reviewed and demonstrates an approximately 5 cm aneurysm.  Current Facility-Administered Medications  Medication Dose Route Frequency Provider Last Rate Last Dose  . 0.9 %  sodium chloride infusion   Intravenous Continuous Durenda Hurt, MD 125 mL/hr at 03/29/18 0850    . ceFAZolin (ANCEF) 2-4 GM/100ML-% IVPB           . ceFAZolin (ANCEF) IVPB 2g/100 mL premix  2 g Intravenous On Call to [place], Joelene Millin A, PA-C      . famotidine (PEPCID) 20 MG tablet            Current Outpatient Medications  Medication Sig Dispense Refill  . aspirin EC 81 MG tablet Take 1 tablet by mouth daily. pm    . atorvastatin (LIPITOR) 80 MG tablet Take 1 tablet (80 mg total) by mouth daily. 90 tablet 4  . hydrochlorothiazide (HYDRODIURIL) 25 MG tablet TAKE 1 TABLET(25 MG) BY MOUTH DAILY 90 tablet 3  . Oxycodone HCl 10 MG TABS Take 1 tablet (10 mg total) by mouth 4 (four) times daily. 120 tablet 0  . traZODone (DESYREL) 150 MG tablet TAKE 1 TABLET(150 MG) BY MOUTH AT BEDTIME AS NEEDED 90 tablet 3  . Wheat Dextrin (BENEFIBER) POWD Stir 2 tsp. TID into 4-8 oz of any non-carbonated beverage or soft food (hot or cold) (Patient not taking: Reported on 02/17/2018) 500 g PRN     Past Medical History:  Diagnosis Date  . Aortic aneurysm ([place])   . Coronary artery disease   . DVT (deep venous thrombosis) ([place])    H/O DVT in left leg, filter placed approximatly 8 yrs  ago  . H/O spinal fusion 1998   Back pain  . Hypertension    controlled on meds  . Personal history of tobacco use, presenting hazards to health 06/12/2015  . Personal history of tobacco use, presenting hazards to health 06/12/2015  . Prostate cancer [facility])    Had prostatectomy  . Wears dentures    upper         Past Surgical History:  Procedure Laterality Date  . BACK SURGERY  1998   Lumbar spine fusion  . carotid doppler ultrasound  10/05/2011   75-90% RCA occlusion, 50% on left  . CAROTID ENDARTERECTOMY Right 12/02/2011   Dr. Lucky Cowboy  . CHOLECYSTECTOMY  10/02/2010   Laparoscopic, Dr. Pat Patrick, [facility]  . COLONOSCOPY WITH PROPOFOL N/A 06/20/2017   Procedure: COLONOSCOPY WITH PROPOFOL;  Surgeon: [name], MD;  Location: [place];  Service: Endoscopy;  Laterality: N/A;  . 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 Cholelithiasis and acute cholecystiis. Mild enlargement of left adrenal gland  . LUMBAR SPINE HARDWARE REMOVAL  07/30/2016   [name], MD  . PROSTATE SURGERY  10/19/2012   prostatectomy. [facility]; Laser assisted, Done by [name] for prostate cancer  Social History  Substance Use Topics  . Smoking status: Current Every Day Smoker    Packs/day: 0.75    Years: 50.00    Types: Cigarettes  . Smokeless tobacco: Never Used  . Alcohol use No    Family History      Family History  Problem Relation Age of Onset  . Heart attack Mother   . Congestive Heart Failure Father   . Cerebral palsy Son     No Known Allergies   REVIEW OF SYSTEMS(Negative unless checked)  Constitutional: [] ??Weight loss[] ??Fever[] ??Chills Cardiac:[] ??Chest pain[] ??Chest pressure[] ??Palpitations [] ??Shortness of breath when laying flat [] ??Shortness of breath at rest [x] ??Shortness of breath with exertion. Vascular: [x] ??Pain in  legs with walking[] ??Pain in legsat rest[] ??Pain in legs when laying flat [] ??Claudication [] ??Pain in feet when walking [] ??Pain in feet at rest [] ??Pain in feet when laying flat [] ??History of DVT [] ??Phlebitis [] ??Swelling in legs [] ??Varicose veins [] ??Non-healing ulcers Pulmonary: [] ??Uses home oxygen [] ??Productive cough[] ??Hemoptysis [] ??Wheeze [] ??COPD [] ??Asthma Neurologic: [] ??Dizziness [] ??Blackouts [] ??Seizures [] ??History of stroke [] ??History of TIA[] ??Aphasia [] ??Temporary blindness[] ??Dysphagia [] ??Weaknessor numbness in arms [] ??Weakness or numbnessin legs Musculoskeletal: [x] ??Arthritis [] ??Joint swelling [] ??Joint pain [x] ??Low back pain Hematologic:[] ??Easy bruising[] ??Easy bleeding [] ??Hypercoagulable state [] ??Anemic  Gastrointestinal:[] ??Blood in stool[] ??Vomiting blood[] ??Gastroesophageal reflux/heartburn[] ??Abdominal pain Genitourinary: [] ??Chronic kidney disease [] ??Difficulturination [] ??Frequenturination [] ??Burning with urination[] ??Hematuria Skin: [] ??Rashes [] ??Ulcers [] ??Wounds Psychological: [] ??History of anxiety[] ??History of major depression.   Physical Examination  Vitals:   03/29/18 0832  BP: (!) 152/81  Pulse: 70  Resp: 18  Temp: 98.4 F (36.9 C)  TempSrc: Oral  SpO2: 94%  Weight: 79.4 kg  Height: 6\' 1"  (1.854 m)   Body mass index is 23.09 kg/m. Gen: WD/WN, NAD Head: [place]/AT, No temporalis wasting. Ear/Nose/Throat: Hearing grossly intact, nares w/o erythema or drainage, oropharynx w/o Erythema/Exudate,  Eyes: Conjunctiva clear, sclera non-icteric Neck: Trachea midline.  No JVD.  Pulmonary:  Good air movement, respirations not labored, no use of accessory muscles.  Cardiac: RRR, normal S1, S2. Vascular:  Vessel Right Left  Radial Palpable Palpable                          PT Palpable Palpable  DP Palpable Palpable   Gastrointestinal:  soft, non-tender/non-distended.  Increased aortic impulse Musculoskeletal: M/S 5/5 throughout.  Extremities without ischemic changes.  No deformity or atrophy.  Neurologic: Sensation grossly intact in extremities.  Symmetrical.  Speech is fluent. Motor exam as listed above. Psychiatric: Judgment intact, Mood & affect appropriate for pt's clinical situation. Dermatologic: No rashes or ulcers noted.  No cellulitis or open wounds.      CBC Lab Results  Component Value Date   WBC 10.3 03/27/2018   HGB 14.6 03/27/2018   HCT 42.9 03/27/2018   MCV 92.9 03/27/2018   PLT 172 03/27/2018    BMET    Component Value Date/Time   NA 142 03/27/2018 1430   NA 144 04/26/2017 1058   NA 144 12/03/2011 0613   K 3.2 (L) 03/27/2018 1430   K 3.5 12/03/2011 0613   CL 104 03/27/2018 1430   CL 108 (H) 12/03/2011 0613   CO2 32 03/27/2018 1430   CO2 27 12/03/2011 0613   GLUCOSE 107 (H) 03/27/2018 1430   GLUCOSE 94 12/03/2011 0613   BUN 19 03/27/2018 1430   BUN 14 04/26/2017 1058   BUN 9 12/03/2011 0613   CREATININE 1.27 (H) 03/27/2018 1430   CREATININE 0.83 12/03/2011 0613   CALCIUM 9.0 03/27/2018 1430   CALCIUM 8.7 12/03/2011 0613   GFRNONAA 56 (L) 03/27/2018  [place] >60 12/03/2011 0613   GFRAA >60 03/27/2018 1430   GFRAA >60 12/03/2011 6060   Estimated Creatinine Clearance: 59.9 mL/min (A) (by C-G formula based on SCr of 1.27 mg/dL (H)).  COAG Lab Results  Component Value Date   INR 1.0 03/27/2018   INR 1.0 12/03/2011    Radiology No results found.    Assessment/Plan Hypertension blood pressure control important in reducing the progression of atherosclerotic disease. On appropriate oral medications.   Hypercholesteremia lipid control important in reducing the progression of atherosclerotic disease. Continue statin therapy  AAA (abdominal aortic aneurysm) without rupture ([place])  CT scan which I have reviewed.  This demonstrates an approximately 5 cm abdominal aortic  aneurysm which begins about a centimeter to a centimeter and a half below the renal arteries.  There is some mural thrombus in the infrarenal neck which measures anywhere from 28 to 30 mm in diameter.  There also appears to be a significant right renal artery stenosis with the right renal artery being the lower of the 2 vessels.  Recommend: The aneurysm is > 5 cm and therefore should undergo repair. Patient is status post CT scan of the abdominal aorta. The patient is a candidate for endovascular repair.  Risks and benefits of endovascular repair were discussed and informed consent was obtained. The patient is reminded that lifetime routine surveillance is a necessity with an endograft.  The risks and benefits of AAA repair are reviewed with the patient.  All questions are answered.  Alternative therapies are also discussed.  The patient agrees to proceed with endovascular aneurysm repair.  Leotis Pain, MD  03/29/2018 9:42 AM

## 2018-03-30 ENCOUNTER — Encounter: Payer: Self-pay | Admitting: Vascular Surgery

## 2018-03-30 DIAGNOSIS — Z9862 Peripheral vascular angioplasty status: Secondary | ICD-10-CM

## 2018-03-30 DIAGNOSIS — I701 Atherosclerosis of renal artery: Secondary | ICD-10-CM

## 2018-03-30 DIAGNOSIS — I714 Abdominal aortic aneurysm, without rupture: Principal | ICD-10-CM

## 2018-03-30 LAB — CBC
HCT: 39.6 % (ref 39.0–52.0)
Hemoglobin: 13.4 g/dL (ref 13.0–17.0)
MCH: 30.7 pg (ref 26.0–34.0)
MCHC: 33.8 g/dL (ref 30.0–36.0)
MCV: 90.8 fL (ref 80.0–100.0)
Platelets: 165 10*3/uL (ref 150–400)
RBC: 4.36 MIL/uL (ref 4.22–5.81)
RDW: 12.3 % (ref 11.5–15.5)
WBC: 13.8 10*3/uL — ABNORMAL HIGH (ref 4.0–10.5)
nRBC: 0 % (ref 0.0–0.2)

## 2018-03-30 LAB — BASIC METABOLIC PANEL
Anion gap: 9 (ref 5–15)
BUN: 15 mg/dL (ref 8–23)
CO2: 27 mmol/L (ref 22–32)
Calcium: 8.3 mg/dL — ABNORMAL LOW (ref 8.9–10.3)
Chloride: 104 mmol/L (ref 98–111)
Creatinine, Ser: 0.97 mg/dL (ref 0.61–1.24)
GFR calc non Af Amer: >60 mL/min (ref >60)
Glucose, Bld: 103 mg/dL — ABNORMAL HIGH (ref 70–99)
POTASSIUM: 3.4 mmol/L — AB (ref 3.5–5.1)
Sodium: 140 mmol/L (ref 135–145)

## 2018-03-30 MED ORDER — ACETAMINOPHEN 325 MG PO TABS: 325.0000 mg | ORAL_TABLET | Freq: Four times a day (QID) | ORAL | Status: DC | PRN

## 2018-03-30 MED ORDER — CLOPIDOGREL BISULFATE 75 MG PO TABS: 75.0000 mg | ORAL_TABLET | Freq: Every day | ORAL | 3 refills | Status: DC

## 2018-03-30 MED ORDER — CLOPIDOGREL BISULFATE 75 MG PO TABS: 75.0000 mg | ORAL_TABLET | Freq: Every day | ORAL | Status: DC

## 2018-03-30 MED ORDER — TRAMADOL HCL 50 MG PO TABS: 50.0000 mg | ORAL_TABLET | Freq: Four times a day (QID) | ORAL | Status: DC | PRN

## 2018-03-30 MED ORDER — TRAMADOL HCL 50 MG PO TABS: 50.0000 mg | ORAL_TABLET | Freq: Four times a day (QID) | ORAL | 0 refills | Status: DC | PRN

## 2018-03-30 NOTE — Care Management Note (Signed)
Case Management Note  Patient Details  Name: Justin Harrington MRN: [id number] Date of Birth: 07-21-1945  Subjective/Objective:    Patient admitted to the ICU after endovascular repair of AAA.  Patient is doing well and will be discharged home today.  Wife and daughter are at the bedside.  Patient is independent in ADL's and requires no assistive devices.  PCP verified as Dr. Caryn Section, cardiologist is Dr. Ubaldo Glassing.  Pharmacy is [facility] in [place] and patient reports no barriers to obtaining medications.  Patient has no discharge needs RNCM signed off. Doran Clay RN BSN Care Management  [phone]                 Action/Plan:   Expected Discharge Date:                  Expected Discharge Plan:  Home/Self Care  In-House Referral:     Discharge planning Services     Post Acute Care Choice:    Choice offered to:     DME Arranged:    DME Agency:     HH Arranged:    [place] Agency:     Status of Service:  Completed, signed off  If discussed at [place] of Stay Meetings, dates discussed:    Additional Comments:  Shelbie Hutching, RN 03/30/2018, 8:40 AM

## 2018-03-30 NOTE — Discharge Summary (Signed)
[facility]    Discharge Summary  Patient ID:  Justin Harrington MRN: [id number] DOB/AGE: Jun 07, 1945 73 y.o.  Admit date: 03/29/2018 Discharge date: 03/30/2018 Date of Surgery: 03/29/2018 Surgeon: Surgeon(s): Lucky Cowboy Erskine Squibb, MD  Admission Diagnosis: Endovascular AAA        AAA        GORE rep  cc:  Judi Cong  Discharge Diagnoses:  Endovascular AAA        AAA        GORE rep  cc:  Judi Cong  Secondary Diagnoses: Past Medical History:  Diagnosis Date  . Aortic aneurysm ([place])   . Coronary artery disease   . DVT (deep venous thrombosis) ([place])    H/O DVT in left leg, filter placed approximatly 8 yrs ago  . H/O spinal fusion 1998   Back pain  . Hypertension    controlled on meds  . Personal history of tobacco use, presenting hazards to health 06/12/2015  . Personal history of tobacco use, presenting hazards to health 06/12/2015  . Prostate cancer [facility])    Had prostatectomy  . Wears dentures    upper   Procedure(s): ENDOVASCULAR REPAIR/STENT GRAFT  Discharged Condition: good  HPI:  The patient is a 73 year old male with multiple medical issues who presented with an abdominal aortic aneurysm approximately 5cm in size.  On March 29, 2018, the patient underwent: 1. US guidance for vascular access, bilateral femoral arteries 2. Catheter placement into aorta from bilateral femoral approaches 3. Placement of a C3 Gore Excluder Endoprosthesis  31 x 14 x 15 main body 4. Placement of a 12 x 14 contralateral limb with extension into the mid external iliac 5. Percutaneous transluminal angioplasty and stent placement right renal artery with a 7 mm x 26 mm lifestream stent 6. ProGlide closure devices bilateral femoral arteries  The patient was transferred from the OR to the recovery room in the ICU for observation overnight.  The patient's night of surgery was unremarkable.  Stop day 1 the patient's diet was advanced, he was urinating independently and was  ambulating without complication.  Upon discharge the patient's vital signs were stable and his physical exam was unremarkable.  Hospital Course:  Justin Harrington is a 73 y.o. male is S/P:  Procedure(s): ENDOVASCULAR REPAIR/STENT GRAFT  Extubated: POD # 0  Physical exam:  A&Ox3, NAD CV: RRR Pulm: CTA Bilaterally Abdomen: Soft, Nontender, Non-distended, (+) Bowel Sounds Groin: Clean and dry.  Post-op wounds clean, dry, intact or healing well  Pt. Ambulating, voiding and taking PO diet without difficulty.  Pt pain controlled with PO pain meds.  Labs as below  Complications: None  Consults: None  Significant Diagnostic Studies: CBC Lab Results  Component Value Date   WBC 13.8 (H) 03/30/2018   HGB 13.4 03/30/2018   HCT 39.6 03/30/2018   MCV 90.8 03/30/2018   PLT 165 03/30/2018   BMET    Component Value Date/Time   NA 140 03/30/2018 0303   NA 144 04/26/2017 1058   NA 144 12/03/2011 0613   K 3.4 (L) 03/30/2018 0303   K 3.5 12/03/2011 0613   CL 104 03/30/2018 0303   CL 108 (H) 12/03/2011 0613   CO2 27 03/30/2018 0303   CO2 27 12/03/2011 0613   GLUCOSE 103 (H) 03/30/2018 0303   GLUCOSE 94 12/03/2011 0613   BUN 15 03/30/2018 0303   BUN 14 04/26/2017 1058   BUN 9 12/03/2011 0613   CREATININE 0.97 03/30/2018 0303  CREATININE 0.83 12/03/2011 0613   CALCIUM 8.3 (L) 03/30/2018 0303   CALCIUM 8.7 12/03/2011 0613   GFRNONAA >60 03/30/2018 0303   GFRNONAA >60 12/03/2011 0613   GFRAA >60 03/30/2018 0303   GFRAA >60 12/03/2011 0613   COAG Lab Results  Component Value Date   INR 1.0 03/27/2018   INR 1.0 12/03/2011   Disposition:  Discharge to :Home  Allergies as of 03/30/2018      Reactions   Zolpidem    Other reaction(s): Other (See Comments) GOT UP IN MIDDLE OF NIGHT AND FILLED ALL THE GLASSES WITH FLOUR      Medication List    STOP taking these medications   Oxycodone HCl 10 MG Tabs     TAKE these medications   acetaminophen 325 MG  tablet Commonly known as:  TYLENOL Take 1-2 tablets (325-650 mg total) by mouth every 6 (six) hours as needed for mild pain (or temp >/= 101 F).   aspirin EC 81 MG tablet Take 81 mg by mouth every evening.   atorvastatin 80 MG tablet Commonly known as:  LIPITOR Take 1 tablet (80 mg total) by mouth daily.   clopidogrel 75 MG tablet Commonly known as:  PLAVIX Take 1 tablet (75 mg total) by mouth daily.   doxylamine (Sleep) 25 MG tablet Commonly known as:  UNISOM Take 25 mg by mouth at bedtime as needed for sleep.   EX-LAX PO Take 3-4 tablets by mouth every 3 (three) days as needed (constipation).   hydrochlorothiazide 25 MG tablet Commonly known as:  HYDRODIURIL TAKE 1 TABLET(25 MG) BY MOUTH DAILY What changed:  See the new instructions.   traMADol 50 MG tablet Commonly known as:  ULTRAM Take 1 tablet (50 mg total) by mouth every 6 (six) hours as needed for moderate pain or severe pain.   traZODone 150 MG tablet Commonly known as:  DESYREL TAKE 1 TABLET(150 MG) BY MOUTH AT BEDTIME AS NEEDED What changed:    how much to take  how to take this  when to take this  reasons to take this  additional instructions      Verbal and written Discharge instructions given to the patient. Wound care per Discharge AVS  Signed: Janalyn Harder Antonyo Hinderer, PA-C  03/30/2018, 10:04 AM

## 2018-03-31 ENCOUNTER — Telehealth: Payer: Self-pay | Admitting: Family Medicine

## 2018-03-31 MED ORDER — OXYCODONE HCL 10 MG PO TABS: 10.0000 mg | ORAL_TABLET | Freq: Four times a day (QID) | ORAL | 0 refills | Status: DC

## 2018-03-31 NOTE — Telephone Encounter (Signed)
Pt needing a refill on his oxycodone. Rx is not on his list of medications.  Please fill at:  [facility] [place], [place] [address] AT [place] [phone] (Phone) [phone] (Fax)   Thanks, [employer]

## 2018-03-31 NOTE — Telephone Encounter (Signed)
Please review. Thanks!  

## 2018-04-02 IMAGING — CR DG SI JOINTS 3+V
1 series · 4 of 4 positions shown · non-contrast
Comparison: 08/03/2012 CT.

CLINICAL DATA: 70-year-old male post lumbar fusion 0994 with lower
back pain extending into right hip since surgery. Post prostatectomy
for prostate cancer. Initial encounter.

EXAM:
BILATERAL SACROILIAC JOINTS - 3+ VIEW

[Series 1: dg si joints · 0.14mm/px · 4 of 4 slices shown]
[im 1/4]
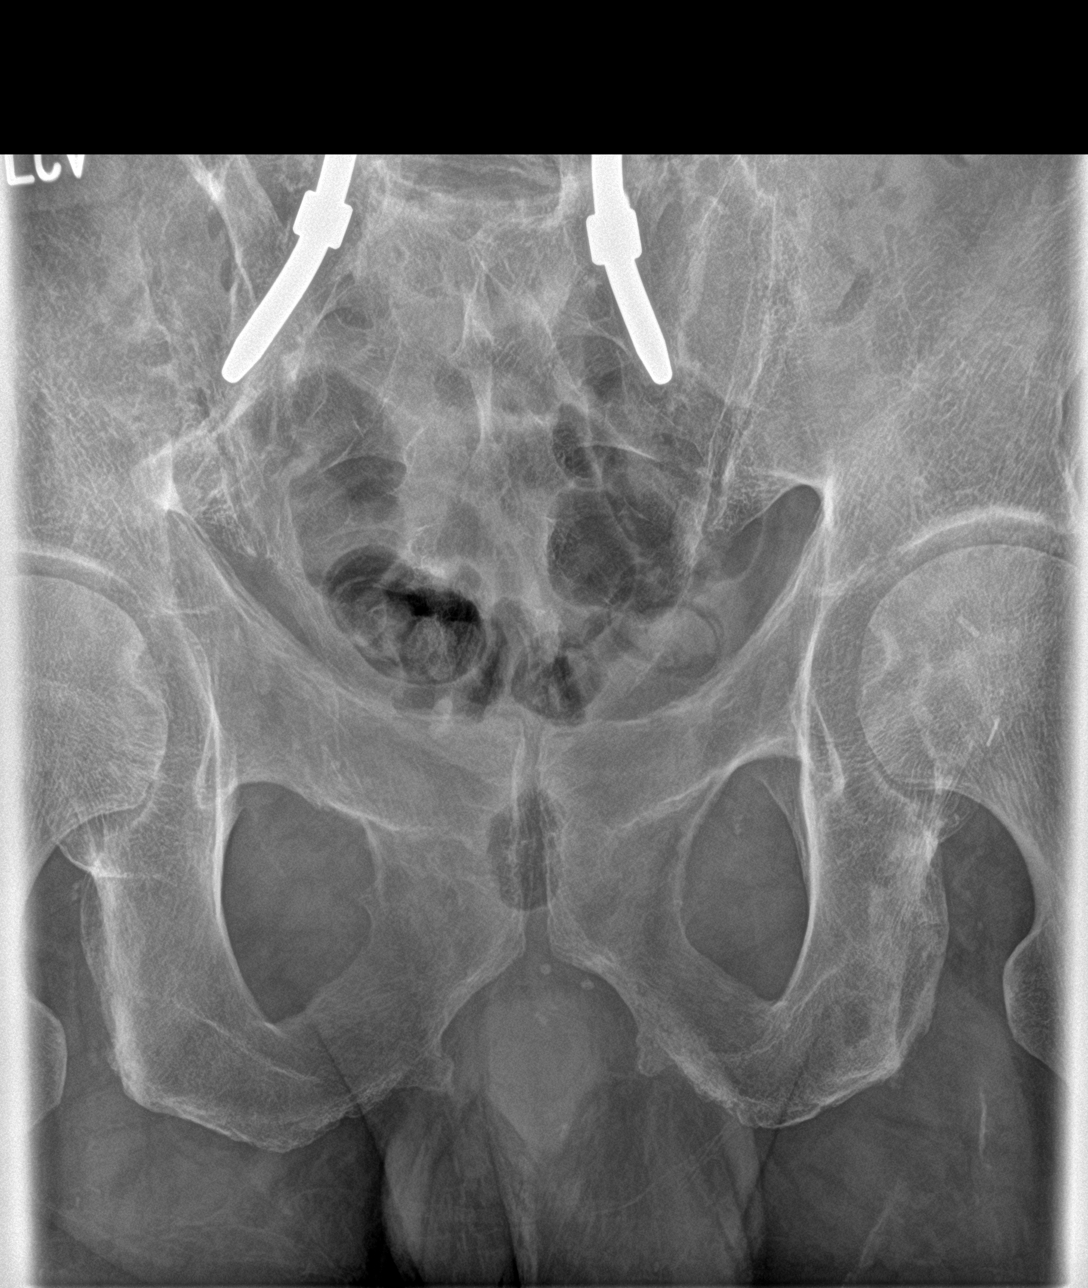
[im 2/4]
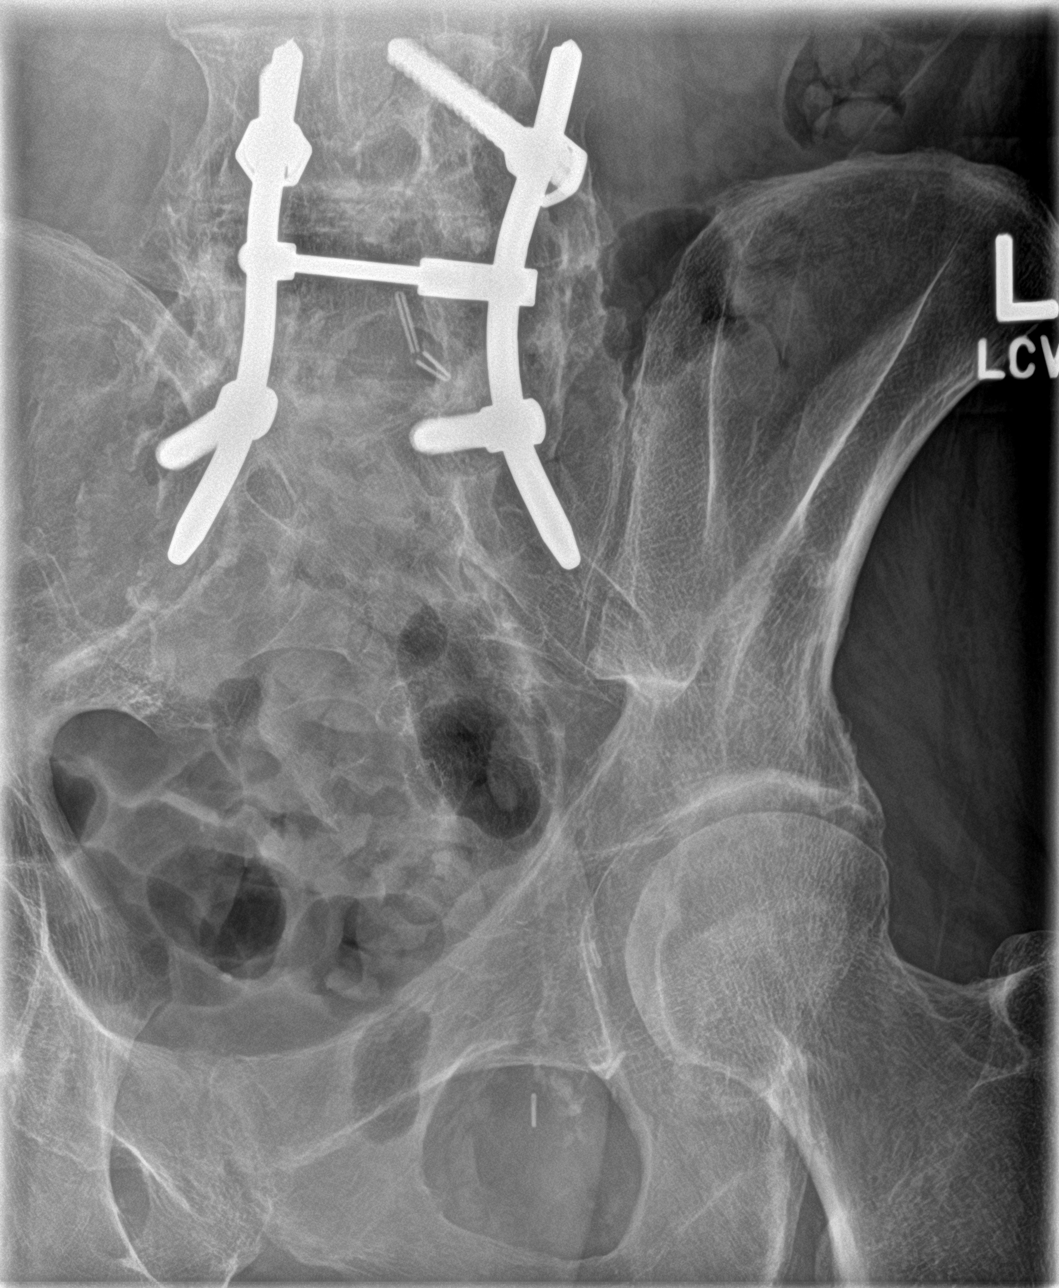
[im 3/4]
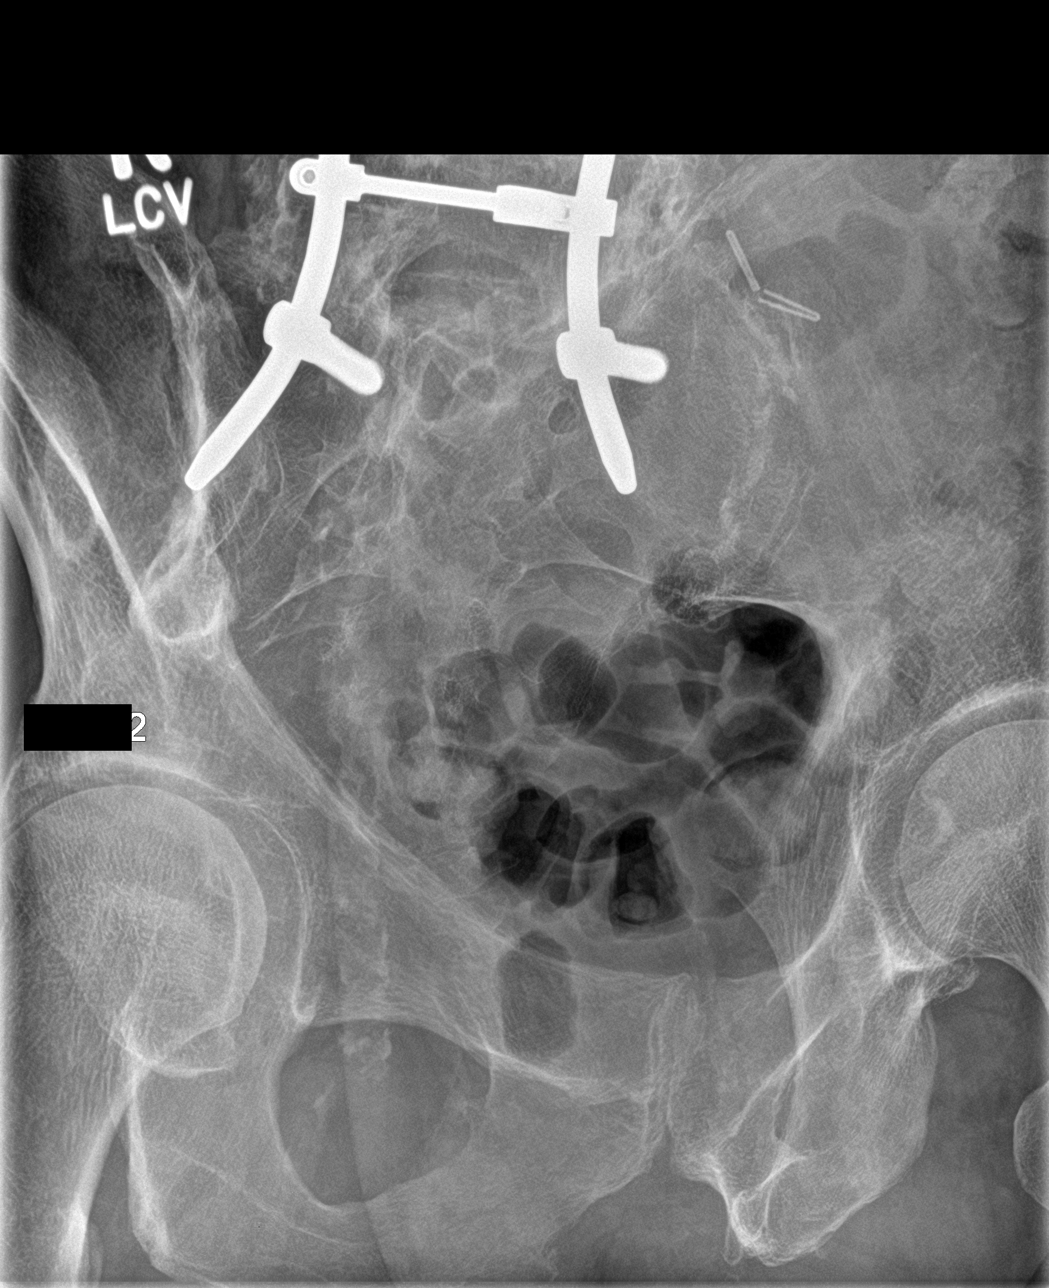
[im 4/4]
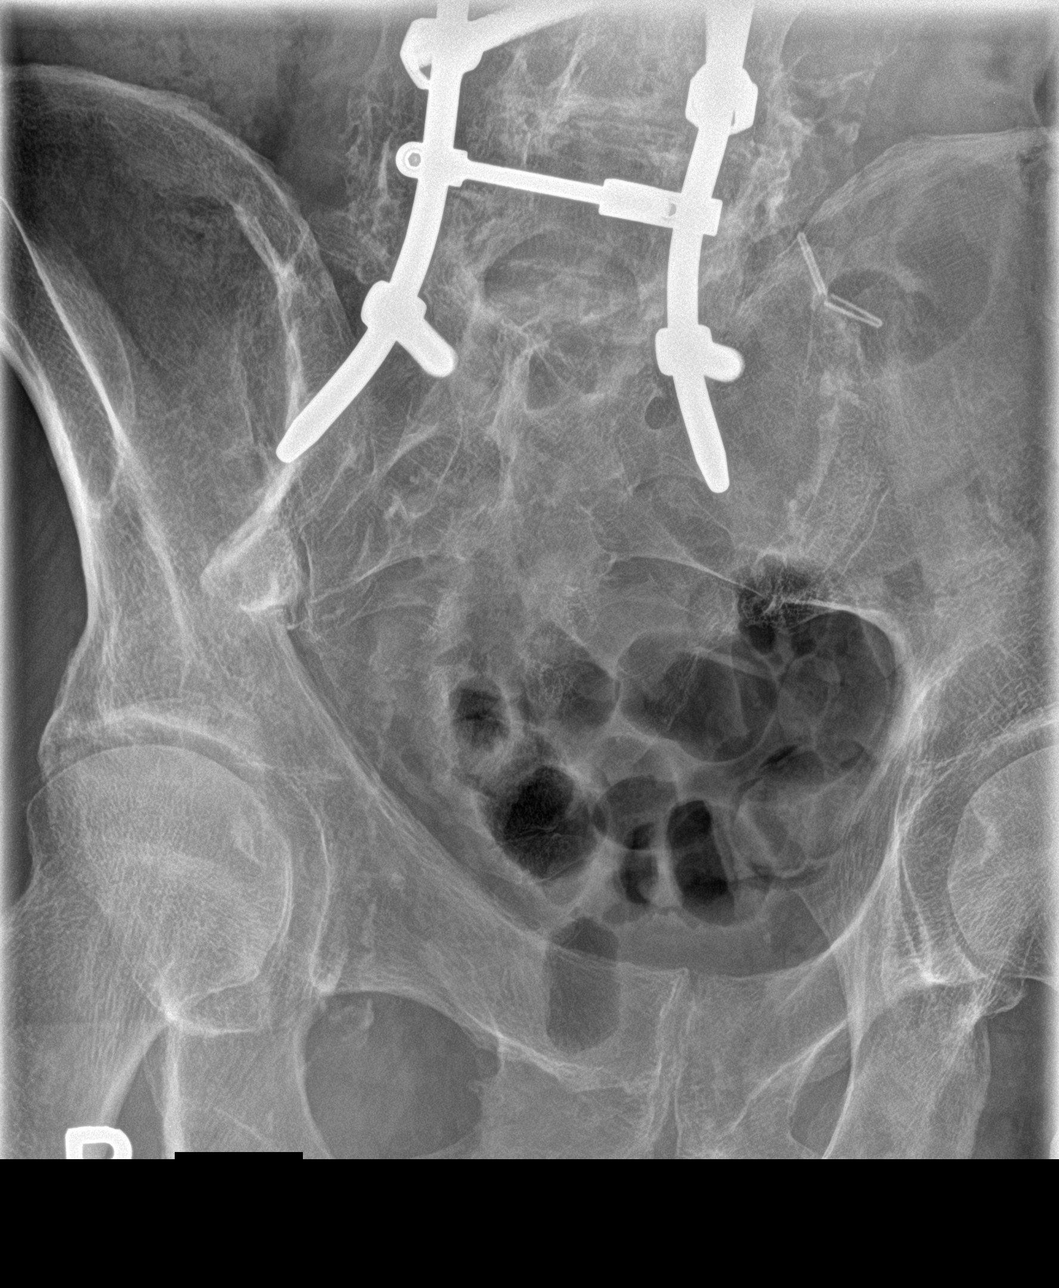

[4 of 4 positions shown; findings below may reference images not displayed]

FINDINGS: Post fusion lower lumbar spine/upper sacrum. Bone donor site right
iliac wing.

Very mild right sacroiliac joint degenerative changes.

Vascular calcifications.
IMPRESSION: Post fusion lower lumbar spine/upper sacrum.

Very mild right sacroiliac joint degenerative changes.

## 2018-04-02 IMAGING — CR DG HIP (WITH OR WITHOUT PELVIS) 2-3V*L*
1 series · 3 of 3 positions shown · non-contrast
Comparison: 08/03/2012 CT.

CLINICAL DATA: 70-year-old male post lumbar fusion 9336 with lower
back pain extending into right hip since surgery. Post prostatectomy
for prostate cancer. Initial encounter.

EXAM:
DG HIP (WITH OR WITHOUT PELVIS) 2-3V LEFT

[Series 1: dg hip unilat w or w/o pelvis 2-3 views  · non-contrast · 0.14mm/px · 3 of 3 slices shown]
[im 1/3]
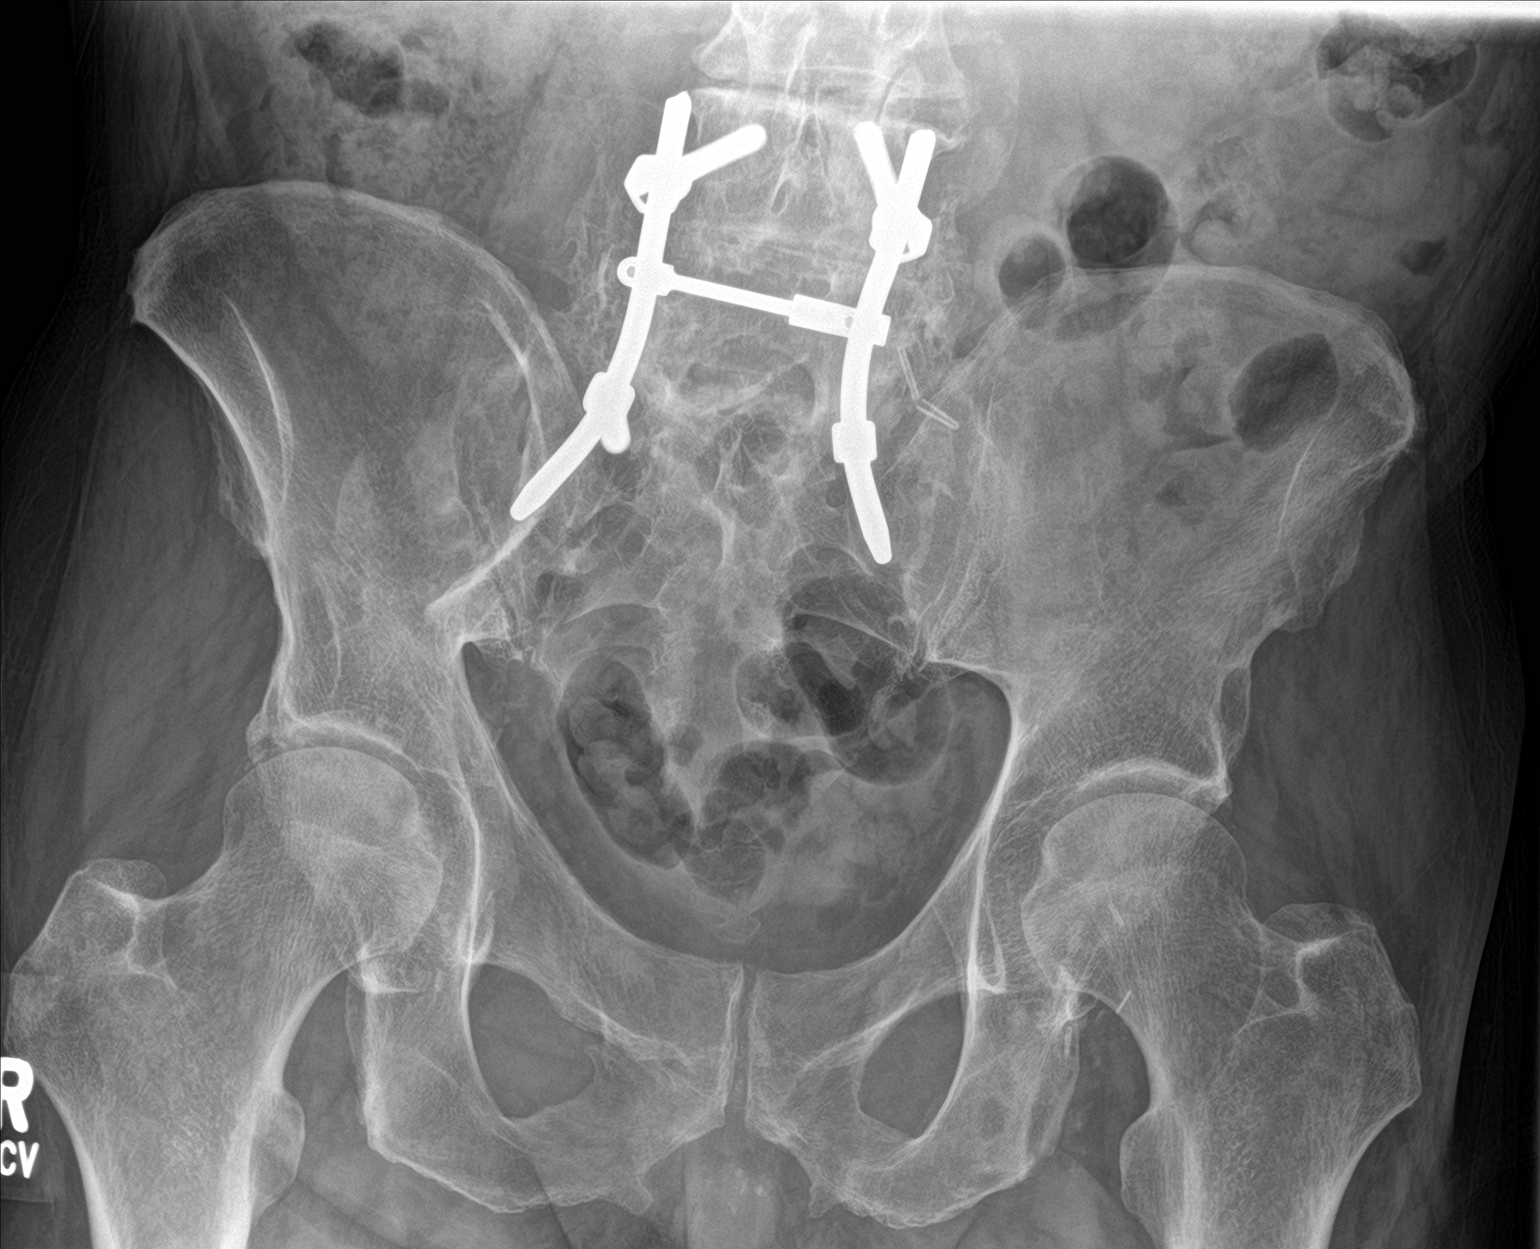
[im 2/3]
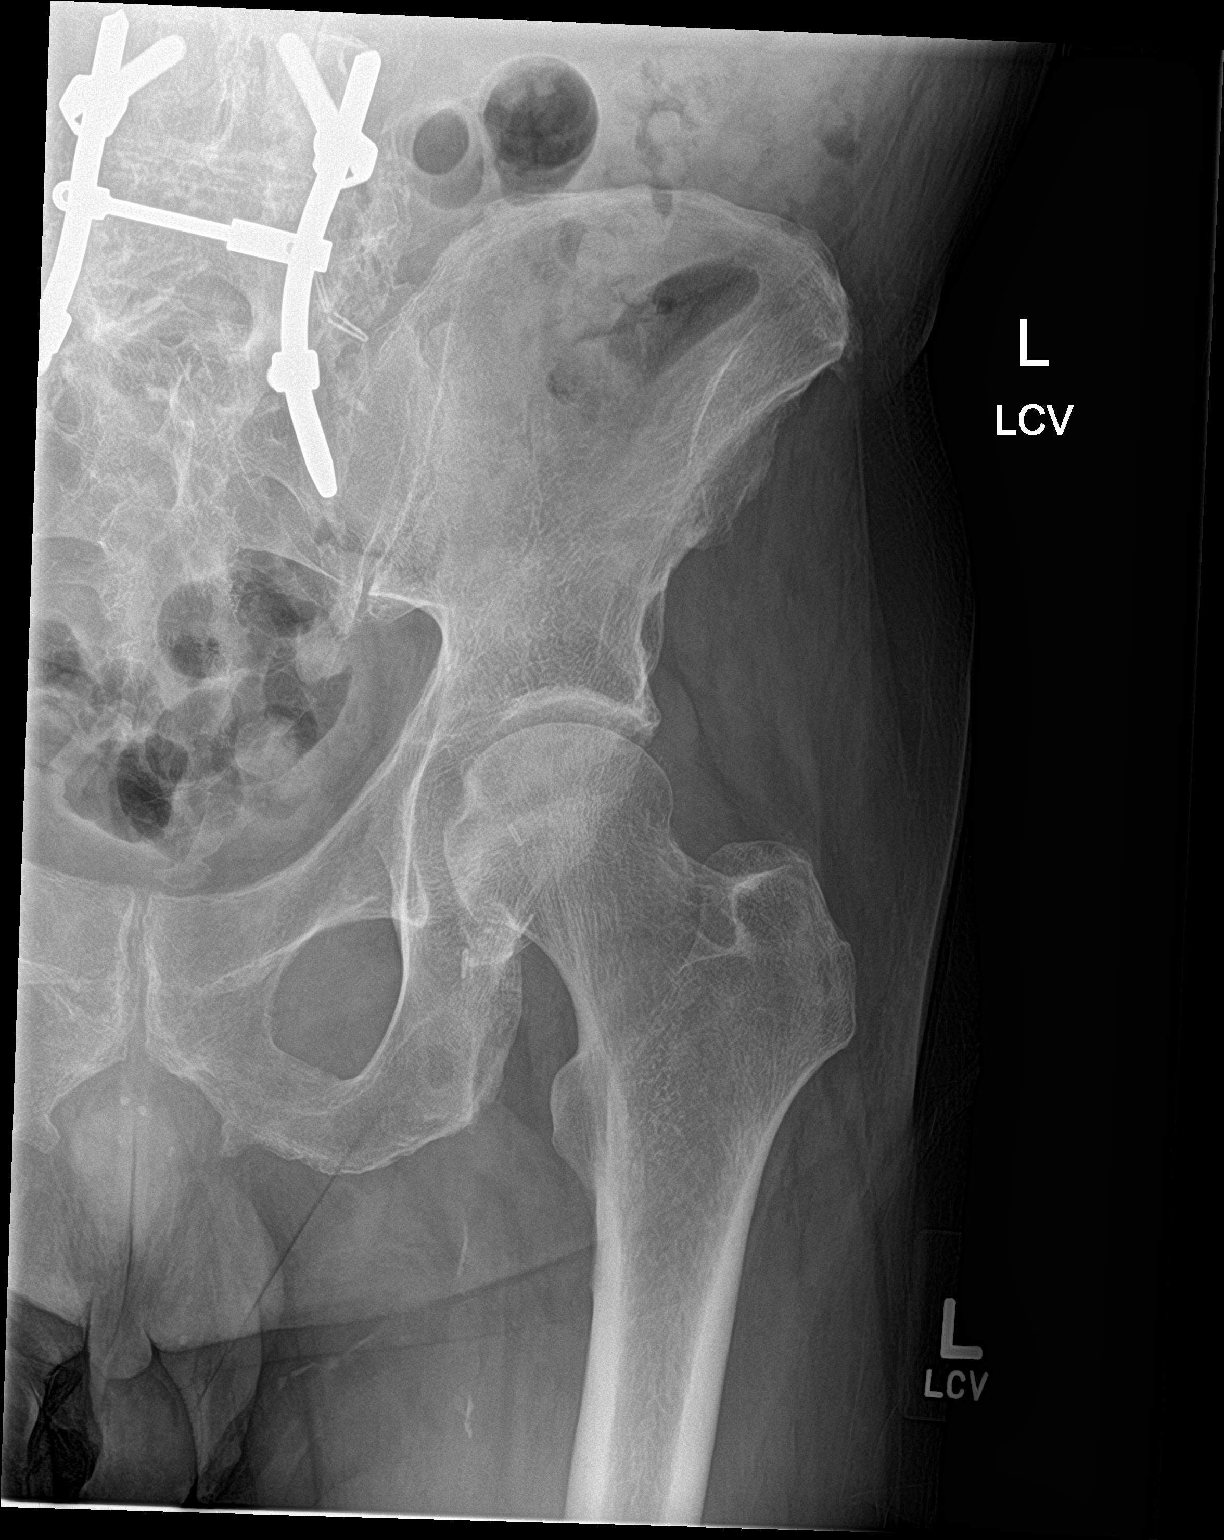
[im 3/3]
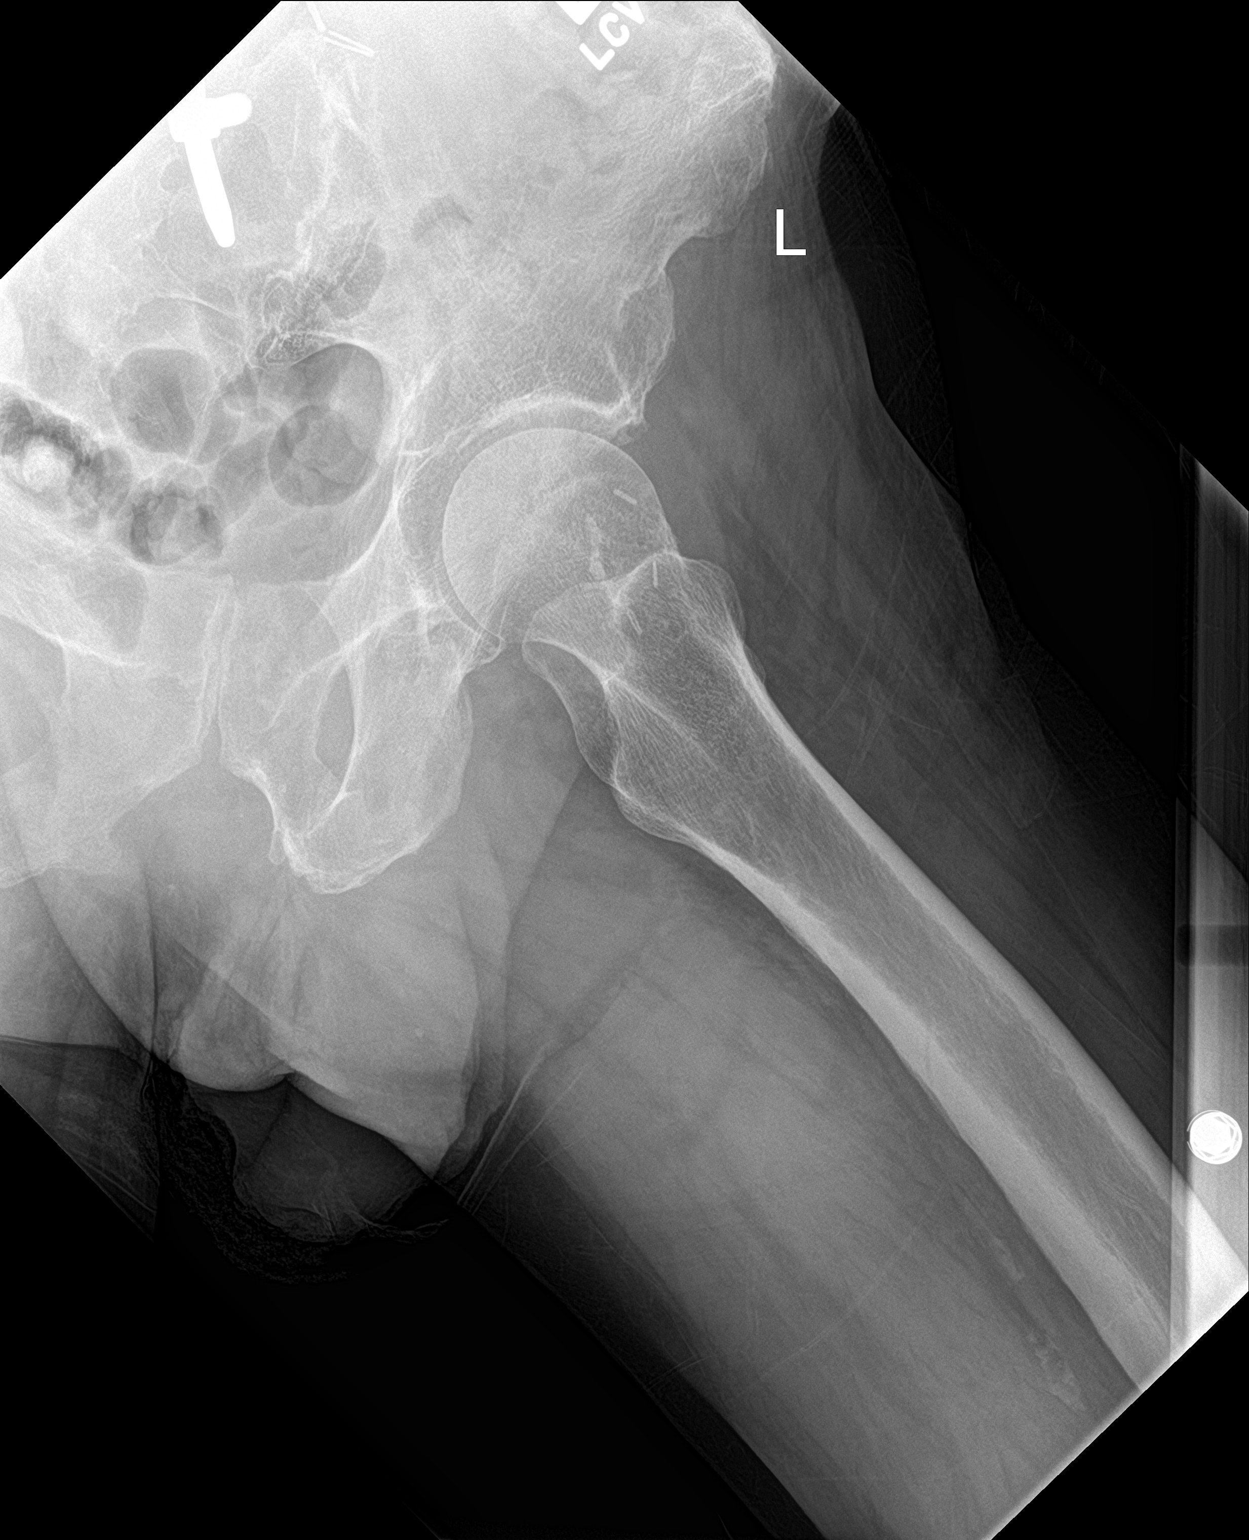

[3 of 3 positions shown; findings below may reference images not displayed]

FINDINGS: No fracture or dislocation.

No plain film evidence of left femoral head avascular necrosis.

Preservation joint space.

Lucency left ischium unchanged.

Bony overgrowth ischium bilaterally unchanged.

Postsurgical changes lower lumbar spine.

No sclerotic focus to suggest metastatic disease.

Abdominal aortic aneurysm incompletely assessed on present exam
measuring up to 3.7 cm.
IMPRESSION: No significant left hip joint degenerative changes or evidence of
avascular necrosis.

Abdominal aortic aneurysm incompletely assessed on present exam
measuring up to 3.7 cm.

## 2018-04-03 ENCOUNTER — Telehealth (INDEPENDENT_AMBULATORY_CARE_PROVIDER_SITE_OTHER): Payer: Self-pay

## 2018-04-03 ENCOUNTER — Other Ambulatory Visit (INDEPENDENT_AMBULATORY_CARE_PROVIDER_SITE_OTHER): Payer: Self-pay | Admitting: Nurse Practitioner

## 2018-04-03 NOTE — Telephone Encounter (Signed)
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 He should wear mild compression stockings, 10-15 mmHG and elevate his leg as much as possible.  He can take ibuprofen and/or tylenol for pain relief.  We should see him in 2 months with ABIs and an EVAR.

## 2018-04-03 NOTE — Telephone Encounter (Signed)
Pt coming in on May 12,2020

## 2018-04-07 ENCOUNTER — Ambulatory Visit (INDEPENDENT_AMBULATORY_CARE_PROVIDER_SITE_OTHER): Payer: PPO | Admitting: Physician Assistant

## 2018-04-07 ENCOUNTER — Encounter: Payer: Self-pay | Admitting: Physician Assistant

## 2018-04-07 VITALS — BP 144/77 | HR 85 | Temp 97.8°F | Resp 16 | Ht 73.0 in | Wt 177.0 lb

## 2018-04-07 DIAGNOSIS — R309 Painful micturition, unspecified: Secondary | ICD-10-CM | POA: Diagnosis not present

## 2018-04-07 LAB — POCT URINALYSIS DIPSTICK
Bilirubin, UA: NEGATIVE
Blood, UA: NEGATIVE
Glucose, UA: NEGATIVE
Ketones, UA: NEGATIVE
Leukocytes, UA: NEGATIVE
Nitrite, UA: NEGATIVE
Protein, UA: NEGATIVE
SPEC GRAV UA: 1.015 (ref 1.010–1.025)
Urobilinogen, UA: 0.2 E.U./dL
pH, UA: 6.5 (ref 5.0–8.0)

## 2018-04-07 MED ORDER — CEPHALEXIN 500 MG PO CAPS: 500.0000 mg | ORAL_CAPSULE | Freq: Two times a day (BID) | ORAL | 0 refills | Status: DC

## 2018-04-07 NOTE — Progress Notes (Signed)
Patient: Justin Harrington Male    DOB: 1946/01/07   73 y.o.   MRN: [id number] Visit Date: 04/07/2018  Today's Provider: Mar Daring, PA-C   Chief Complaint  Patient presents with  . Urinary Tract Infection   Subjective:    I, Sulibeya S. Dimas, CMA, am acting as a Education administrator for [employer], PA-C.  HPI Urinary Tract Infection: Patient complains of burning with urination, dysuria and hematuria He has had symptoms for 2 weeks. Patient also complains of back pain. Patient denies fever. Patient does not have a history of recurrent UTI.  Patient does not have a history of pyelonephritis. Patient reports he does have a hx of prostate cancer and prostatectomy.   Allergies  Allergen Reactions  . Zolpidem     Other reaction(s): Other (See Comments) GOT UP IN MIDDLE OF NIGHT AND FILLED ALL THE GLASSES WITH FLOUR     Current Outpatient Medications:  .  acetaminophen (TYLENOL) 325 MG tablet, Take 1-2 tablets (325-650 mg total) by mouth every 6 (six) hours as needed for mild pain (or temp >/= 101 F)., Disp: , Rfl:  .  aspirin EC 81 MG tablet, Take 81 mg by mouth every evening. , Disp: , Rfl:  .  atorvastatin (LIPITOR) 80 MG tablet, Take 1 tablet (80 mg total) by mouth daily., Disp: 90 tablet, Rfl: 4 .  clopidogrel (PLAVIX) 75 MG tablet, Take 1 tablet (75 mg total) by mouth daily., Disp: 90 tablet, Rfl: 3 .  doxylamine, Sleep, (UNISOM) 25 MG tablet, Take 25 mg by mouth at bedtime as needed for sleep. , Disp: , Rfl:  .  hydrochlorothiazide (HYDRODIURIL) 25 MG tablet, TAKE 1 TABLET(25 MG) BY MOUTH DAILY (Patient taking differently: Take 25 mg by mouth daily. ), Disp: 90 tablet, Rfl: 3 .  Oxycodone HCl 10 MG TABS, Take 1 tablet (10 mg total) by mouth 4 (four) times daily., Disp: 120 tablet, Rfl: 0 .  Sennosides (EX-LAX PO), Take 3-4 tablets by mouth every 3 (three) days as needed (constipation). , Disp: , Rfl:  .  traMADol (ULTRAM) 50 MG tablet, Take 1 tablet (50 mg total) by mouth  every 6 (six) hours as needed for moderate pain or severe pain., Disp: 30 tablet, Rfl: 0 .  traZODone (DESYREL) 150 MG tablet, TAKE 1 TABLET(150 MG) BY MOUTH AT BEDTIME AS NEEDED (Patient taking differently: Take 50 mg by mouth at bedtime as needed for sleep. ), Disp: 90 tablet, Rfl: 3  Review of Systems  Constitutional: Negative.   Respiratory: Negative.   Cardiovascular: Negative.   Gastrointestinal: Negative.   Genitourinary: Positive for difficulty urinating, dysuria and flank pain.  Musculoskeletal: Positive for back pain.    Social History   Tobacco Use  . Smoking status: Current Every Day Smoker    Packs/day: 0.75    Years: 50.00    Pack years: 37.50    Types: Cigarettes  . Smokeless tobacco: Never Used  Substance Use Topics  . Alcohol use: No    Alcohol/week: 0.0 standard drinks      Objective:   BP (!) 144/77 (BP Location: Left Arm, Patient Position: Sitting, Cuff Size: Normal)   Pulse 85   Temp 97.8 F (36.6 C) (Oral)   Resp 16   Ht 6\' 1"  (1.854 m)   Wt 177 lb (80.3 kg)   BMI 23.35 kg/m  Vitals:   04/07/18 0801  BP: (!) 144/77  Pulse: 85  Resp: 16  Temp: 97.8 F (  36.6 C)  TempSrc: Oral  Weight: 177 lb (80.3 kg)  Height: 6\' 1"  (1.854 m)     Physical Exam Vitals signs reviewed.  Constitutional:      General: He is not in acute distress.    Appearance: Normal appearance. He is well-developed. He is not diaphoretic.  Cardiovascular:     Rate and Rhythm: Normal rate and regular rhythm.     Heart sounds: Normal heart sounds. No murmur. No friction rub. No gallop.   Pulmonary:     Effort: Pulmonary effort is normal. No respiratory distress.     Breath sounds: Normal breath sounds. No wheezing or rales.  Abdominal:     General: Bowel sounds are normal. There is no distension.     Palpations: Abdomen is soft. There is no mass.     Tenderness: There is no abdominal tenderness. There is no guarding or rebound.  Skin:    General: Skin is warm and dry.    Neurological:     Mental Status: He is alert and oriented to person, place, and time.         Assessment & Plan    1. Painful urination Worsening symptoms. UA was negative but symptoms with recent procedure on 03/29/18. Will treat empirically with Keflex as below.  Continue to push fluids. Urine sent for culture. Will follow up pending C&S results. She is to call if symptoms do not improve or if they worsen.  - POCT urinalysis dipstick - Urine Culture - cephALEXin (KEFLEX) 500 MG capsule; Take 1 capsule (500 mg total) by mouth 2 (two) times daily.  Dispense: 20 capsule; Refill: 0     Mar Daring, PA-C  [facility]

## 2018-04-07 NOTE — Patient Instructions (Signed)
Urinary Tract Infection, Adult A urinary tract infection (UTI) is an infection of any part of the urinary tract. The urinary tract includes:  The kidneys.  The ureters.  The bladder.  The urethra. These organs make, store, and get rid of pee (urine) in the body. What are the causes? This is caused by germs (bacteria) in your genital area. These germs grow and cause swelling (inflammation) of your urinary tract. What increases the risk? You are more likely to develop this condition if:  You have a small, thin tube (catheter) to drain pee.  You cannot control when you pee or poop (incontinence).  You are male, and: ? You use these methods to prevent pregnancy: ? A medicine that kills sperm (spermicide). ? A device that blocks sperm (diaphragm). ? You have low levels of a male hormone (estrogen). ? You are pregnant.  You have genes that add to your risk.  You are sexually active.  You take antibiotic medicines.  You have trouble peeing because of: ? A prostate that is bigger than normal, if you are male. ? A blockage in the part of your body that drains pee from the bladder (urethra). ? A kidney stone. ? A nerve condition that affects your bladder (neurogenic bladder). ? Not getting enough to drink. ? Not peeing often enough.  You have other conditions, such as: ? Diabetes. ? A weak disease-fighting system (immune system). ? Sickle cell disease. ? Gout. ? Injury of the spine. What are the signs or symptoms? Symptoms of this condition include:  Needing to pee right away (urgently).  Peeing often.  Peeing small amounts often.  Pain or burning when peeing.  Blood in the pee.  Pee that smells bad or not like normal.  Trouble peeing.  Pee that is cloudy.  Fluid coming from the vagina, if you are male.  Pain in the belly or lower back. Other symptoms include:  Throwing up (vomiting).  No urge to eat.  Feeling mixed up (confused).  Being tired  and grouchy (irritable).  A fever.  Watery poop (diarrhea). How is this treated? This condition may be treated with:  Antibiotic medicine.  Other medicines.  Drinking enough water. Follow these instructions at home:  Medicines  Take over-the-counter and prescription medicines only as told by your doctor.  If you were prescribed an antibiotic medicine, take it as told by your doctor. Do not stop taking it even if you start to feel better. General instructions  Make sure you: ? Pee until your bladder is empty. ? Do not hold pee for a long time. ? Empty your bladder after sex. ? Wipe from front to back after pooping if you are a male. Use each tissue one time when you wipe.  Drink enough fluid to keep your pee pale yellow.  Keep all follow-up visits as told by your doctor. This is important. Contact a doctor if:  You do not get better after 1-2 days.  Your symptoms go away and then come back. Get help right away if:  You have very bad back pain.  You have very bad pain in your lower belly.  You have a fever.  You are sick to your stomach (nauseous).  You are throwing up. Summary  A urinary tract infection (UTI) is an infection of any part of the urinary tract.  This condition is caused by germs in your genital area.  There are many risk factors for a UTI. These include having a small, thin   tube to drain pee and not being able to control when you pee or poop.  Treatment includes antibiotic medicines for germs.  Drink enough fluid to keep your pee pale yellow. This information is not intended to replace advice given to you by your health care provider. Make sure you discuss any questions you have with your health care provider. Document Released: 07/07/2007 Document Revised: 07/28/2017 Document Reviewed: 07/28/2017 Elsevier Interactive Patient Education  2019 [employer]  

## 2018-04-09 LAB — URINE CULTURE

## 2018-04-10 ENCOUNTER — Telehealth: Payer: Self-pay

## 2018-04-10 NOTE — Telephone Encounter (Signed)
Patient has been advised he states that his symptoms are gradually improving, instructed patient to complete antibiotic and to call office back if symptoms do not fully resolve after finishing antibiotic. Patient understood. KW

## 2018-04-10 NOTE — Telephone Encounter (Signed)
Agreed -

## 2018-04-10 NOTE — Telephone Encounter (Signed)
-----   Message from Mar Daring, [place] sent at 04/10/2018  8:08 AM EDT ----- Urine culture was negative for bacterial growth. Have symptoms improved with antibiotic? If so, continue until completed.

## 2018-04-28 ENCOUNTER — Other Ambulatory Visit: Payer: Self-pay

## 2018-04-28 ENCOUNTER — Other Ambulatory Visit: Payer: Self-pay | Admitting: Family Medicine

## 2018-04-28 ENCOUNTER — Ambulatory Visit: Payer: Self-pay

## 2018-04-28 DIAGNOSIS — M5442 Lumbago with sciatica, left side: Principal | ICD-10-CM

## 2018-04-28 DIAGNOSIS — G8929 Other chronic pain: Secondary | ICD-10-CM

## 2018-04-28 DIAGNOSIS — M5441 Lumbago with sciatica, right side: Principal | ICD-10-CM

## 2018-04-28 MED ORDER — OXYCODONE HCL 10 MG PO TABS: 10.0000 mg | ORAL_TABLET | Freq: Four times a day (QID) | ORAL | 0 refills | Status: DC

## 2018-05-18 ENCOUNTER — Telehealth: Payer: Self-pay

## 2018-05-18 NOTE — Telephone Encounter (Signed)
Patient called requesting a medication sent in for toenail fungus. He reports that the nails on his large toes are beginning to be painful, and thinks it could be due to the fungus. He uses [facility] in [place]. Contact number is correct. Thanks!

## 2018-05-19 NOTE — Telephone Encounter (Signed)
Apt made for 05/22/2018 at 3:20  Thanks,   -Mickel Baas

## 2018-05-19 NOTE — Telephone Encounter (Signed)
Need to see how they look. Can schedule e-visit with video or come into office to look at.

## 2018-05-22 ENCOUNTER — Other Ambulatory Visit: Payer: Self-pay

## 2018-05-22 ENCOUNTER — Encounter: Payer: Self-pay | Admitting: Family Medicine

## 2018-05-22 ENCOUNTER — Ambulatory Visit (INDEPENDENT_AMBULATORY_CARE_PROVIDER_SITE_OTHER): Payer: PPO | Admitting: Family Medicine

## 2018-05-22 DIAGNOSIS — M5441 Lumbago with sciatica, right side: Secondary | ICD-10-CM | POA: Diagnosis not present

## 2018-05-22 DIAGNOSIS — G8929 Other chronic pain: Secondary | ICD-10-CM | POA: Diagnosis not present

## 2018-05-22 DIAGNOSIS — M5442 Lumbago with sciatica, left side: Secondary | ICD-10-CM

## 2018-05-22 DIAGNOSIS — B351 Tinea unguium: Secondary | ICD-10-CM

## 2018-05-22 MED ORDER — TERBINAFINE HCL 250 MG PO TABS: 500.0000 mg | ORAL_TABLET | Freq: Every day | ORAL | 11 refills | Status: DC

## 2018-05-22 MED ORDER — OXYCODONE HCL 15 MG PO TABS: 15.0000 mg | ORAL_TABLET | Freq: Four times a day (QID) | ORAL | 0 refills | Status: DC | PRN

## 2018-05-22 NOTE — Patient Instructions (Signed)
.   Please review the attached list of medications and notify my office if there are any errors.   . Please bring all of your medications to every appointment so we can make sure that our medication list is the same as yours.   

## 2018-05-22 NOTE — Progress Notes (Addendum)
Patient: Justin Harrington Male    DOB: 1945-02-24   73 y.o.   MRN: [id number] Visit Date: 05/22/2018  Today's Provider: [name], MD   Chief Complaint  Patient presents with  . Foot Problem   Subjective:    Virtual Visit via Video Note  I connected with Justin Harrington on 05/22/18 at  3:20 PM EDT by a video enabled telemedicine application and verified that I am speaking with the correct person using two identifiers.   I discussed the limitations of evaluation and management by telemedicine and the availability of in person appointments. The patient expressed understanding and agreed to proceed.    HPI  He complains of thickened yellow dystrophic nails for several years, usually has to have ground down when getting pedicure, but not able to have this done due to Covid-19 pandemic.   He also states that current dose of oxycodone frequently wears off before next dose is due. He occasionally takes a second tablet to get by when back pain is worse than usual. He was previously on 30mg  tablets, but want to try going up to 20.  Allergies  Allergen Reactions  . Zolpidem     Other reaction(s): Other (See Comments) GOT UP IN MIDDLE OF NIGHT AND FILLED ALL THE GLASSES WITH FLOUR     Current Outpatient Medications:  .  acetaminophen (TYLENOL) 325 MG tablet, Take 1-2 tablets (325-650 mg total) by mouth every 6 (six) hours as needed for mild pain (or temp >/= 101 F)., Disp: , Rfl:  .  aspirin EC 81 MG tablet, Take 81 mg by mouth every evening. , Disp: , Rfl:  .  atorvastatin (LIPITOR) 80 MG tablet, Take 1 tablet (80 mg total) by mouth daily., Disp: 90 tablet, Rfl: 4 .  doxylamine, Sleep, (UNISOM) 25 MG tablet, Take 25 mg by mouth at bedtime as needed for sleep. , Disp: , Rfl:  .  hydrochlorothiazide (HYDRODIURIL) 25 MG tablet, TAKE 1 TABLET(25 MG) BY MOUTH DAILY (Patient taking differently: Take 25 mg by mouth daily. ), Disp: 90 tablet, Rfl: 3 .  Oxycodone HCl 10 MG TABS,  Take 1 tablet (10 mg total) by mouth 4 (four) times daily., Disp: 120 tablet, Rfl: 0 .  Sennosides (EX-LAX PO), Take 3-4 tablets by mouth every 3 (three) days as needed (constipation). , Disp: , Rfl:  .  traZODone (DESYREL) 150 MG tablet, TAKE 1 TABLET(150 MG) BY MOUTH AT BEDTIME AS NEEDED (Patient taking differently: Take 50 mg by mouth at bedtime as needed for sleep. ), Disp: 90 tablet, Rfl: 3 .  cephALEXin (KEFLEX) 500 MG capsule, Take 1 capsule (500 mg total) by mouth 2 (two) times daily., Disp: 20 capsule, Rfl: 0 .  clopidogrel (PLAVIX) 75 MG tablet, Take 1 tablet (75 mg total) by mouth daily. (Patient not taking: Reported on 05/22/2018), Disp: 90 tablet, Rfl: 3 .  traMADol (ULTRAM) 50 MG tablet, Take 1 tablet (50 mg total) by mouth every 6 (six) hours as needed for moderate pain or severe pain. (Patient not taking: Reported on 05/22/2018), Disp: 30 tablet, Rfl: 0  Review of Systems  Constitutional: Negative for appetite change, chills and fever.  Respiratory: Negative for chest tightness, shortness of breath and wheezing.   Cardiovascular: Negative for chest pain and palpitations.  Gastrointestinal: Negative for abdominal pain, nausea and vomiting.    Social History   Tobacco Use  . Smoking status: Current Every Day Smoker    Packs/day: 0.75  Years: 50.00    Pack years: 37.50    Types: Cigarettes  . Smokeless tobacco: Never Used  Substance Use Topics  . Alcohol use: No    Alcohol/week: 0.0 standard drinks      Objective:   There were no vitals taken for this visit.    Physical Exam  General appearance: alert, well developed, well nourished, cooperative and in no distress Head: Normocephalic, without obvious abnormality, atraumatic Respiratory: Respirations even and unlabored, normal respiratory rate Extremities: No gross deformities Skin: Yellow thickened dystrophic nails of toes of both feet.      Assessment & Plan    1. Chronic low back pain (Location of Primary  Source of Pain) (Bilateral) (R>L) increase - oxyCODONE (ROXICODONE) from 10mg  to 15 MG immediate release tablet; Take 1 tablet (15 mg total) by mouth 4 (four) times daily as needed for pain.  Dispense: 120 tablet; Refill: 0  2. Onychomycosis try- terbinafine (LAMISIL) 250 MG tablet; Take 2 tablets (500 mg total) by mouth daily. Take for 1 week out of every 4 weeks  Dispense: 14 tablet; Refill: 11  Counseled on potential liver toxicity and to notify any health care provider of all medications he takes before starting any new prescriptions. He does not drink alcohol and advised not to start.        I discussed the assessment and treatment plan with the patient. The patient was provided an opportunity to ask questions and all were answered. The patient agreed with the plan and demonstrated an understanding of the instructions.   The patient was advised to call back or seek an in-person evaluation if the symptoms worsen or if the condition fails to improve as anticipated.  I provided 15 minutes of non-face-to-face time during this encounter.   [name], MD  [facility]

## 2018-06-09 ENCOUNTER — Other Ambulatory Visit (INDEPENDENT_AMBULATORY_CARE_PROVIDER_SITE_OTHER): Payer: Self-pay | Admitting: Vascular Surgery

## 2018-06-09 DIAGNOSIS — Z9889 Other specified postprocedural states: Secondary | ICD-10-CM

## 2018-06-13 ENCOUNTER — Ambulatory Visit (INDEPENDENT_AMBULATORY_CARE_PROVIDER_SITE_OTHER): Payer: PPO | Admitting: Nurse Practitioner

## 2018-06-13 ENCOUNTER — Other Ambulatory Visit (INDEPENDENT_AMBULATORY_CARE_PROVIDER_SITE_OTHER): Payer: PPO

## 2018-06-13 ENCOUNTER — Encounter (INDEPENDENT_AMBULATORY_CARE_PROVIDER_SITE_OTHER): Payer: PPO

## 2018-06-19 ENCOUNTER — Other Ambulatory Visit: Payer: Self-pay

## 2018-06-19 DIAGNOSIS — G8929 Other chronic pain: Secondary | ICD-10-CM

## 2018-06-19 DIAGNOSIS — M5441 Lumbago with sciatica, right side: Secondary | ICD-10-CM

## 2018-06-19 NOTE — Telephone Encounter (Signed)
30 days dispensed early on 05-22-2018

## 2018-06-19 NOTE — Telephone Encounter (Signed)
Patient is requesting a refill of his Oxycodone 15 MG. L.O.V. 05/22/2018, please advise.

## 2018-06-20 IMAGING — CT CT CHEST LUNG CANCER SCREENING LOW DOSE W/O CM
1 of 3 series · 15 of 40 positions shown, 19 images · non-contrast
Comparison: Low dose CT lung cancer screening dated 06/13/2015

CLINICAL DATA: 70-year-old male current smoker, with 38 pack-year
history of smoking, for follow-up lung cancer screening

EXAM:
CT CHEST WITHOUT CONTRAST LOW-DOSE FOR LUNG CANCER SCREENING
TECHNIQUE: Multidetector CT imaging of the chest was performed following the
standard protocol without IV contrast.

[Series 3: lungs · axial · 0.70mm/px · z∈[-613,-301]mm · 15 of 344 slices shown, 19 images]
[im 16/344  mediastinal]
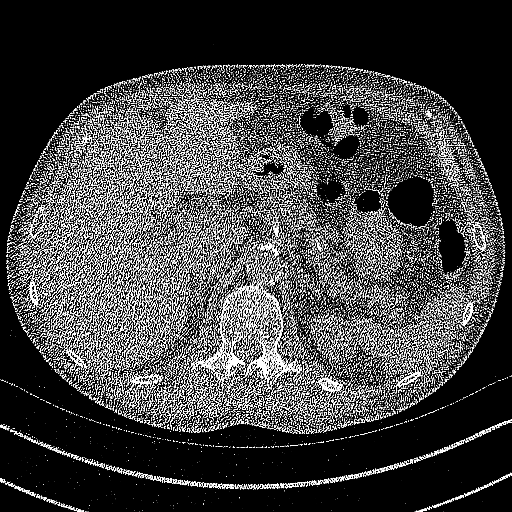
[im 16/344  lung]
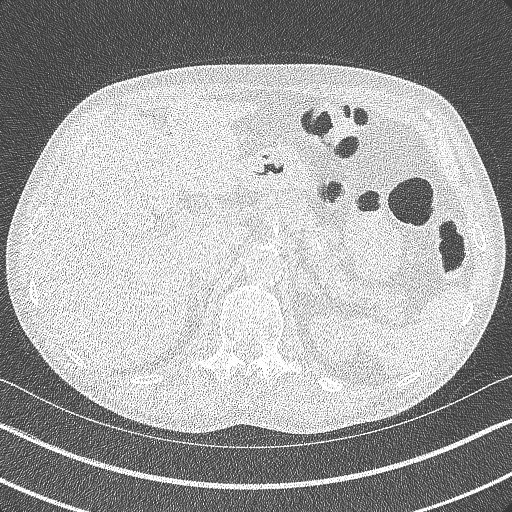
[im 47/344  lung]
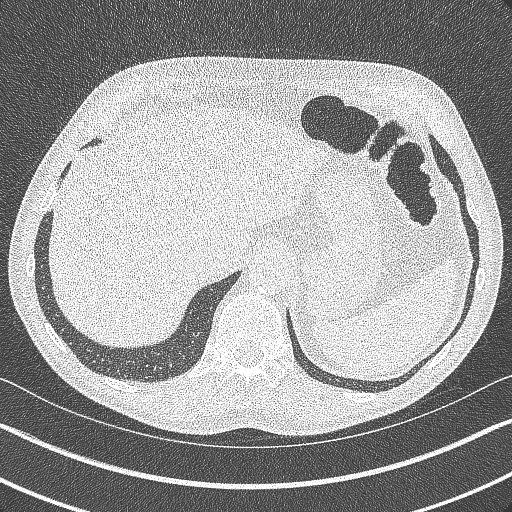
[im 78/344  lung]
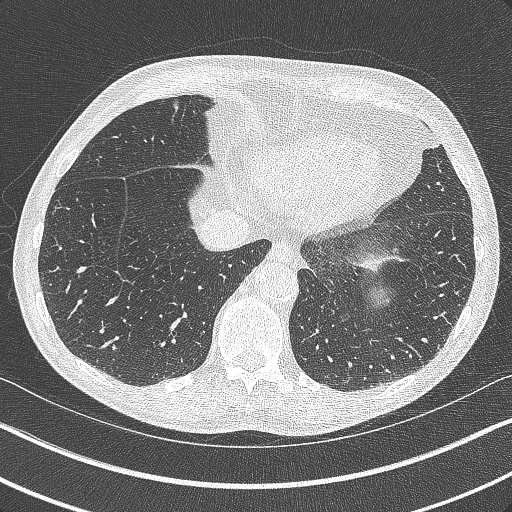
[im 94/344  lung]
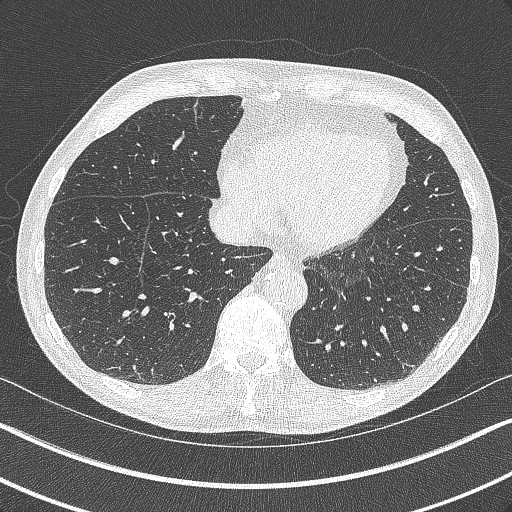
[im 115/344  mediastinal]
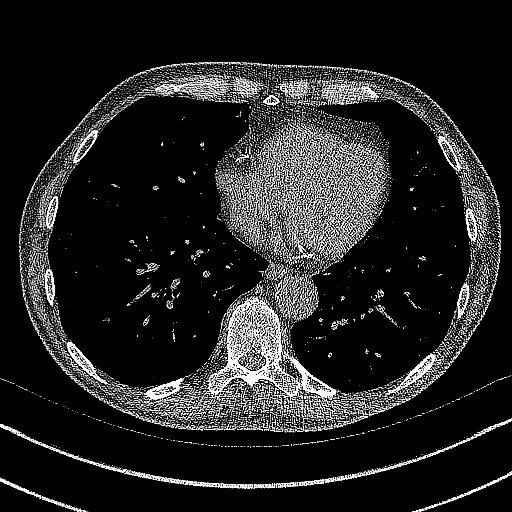
[im 115/344  lung]
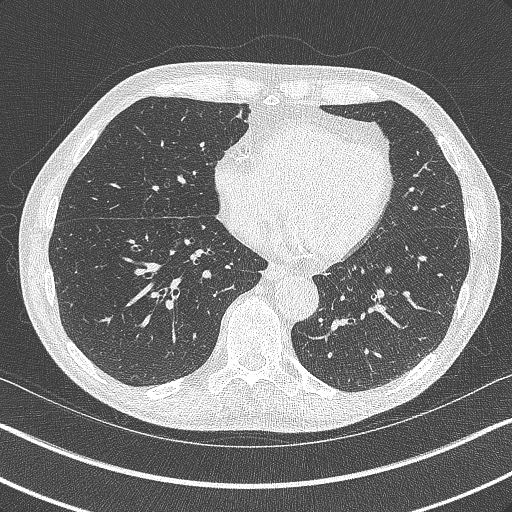
[im 125/344  lung]
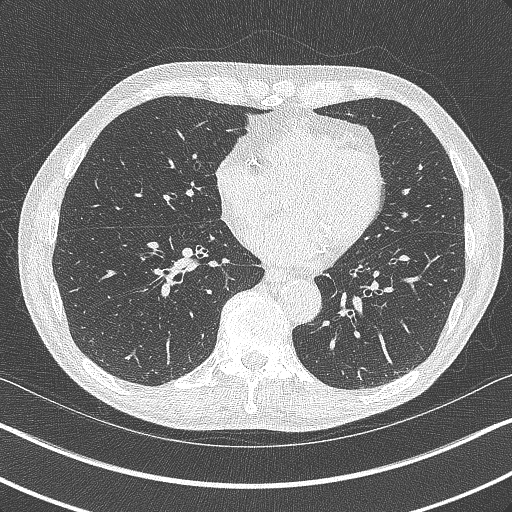
[im 156/344  lung]
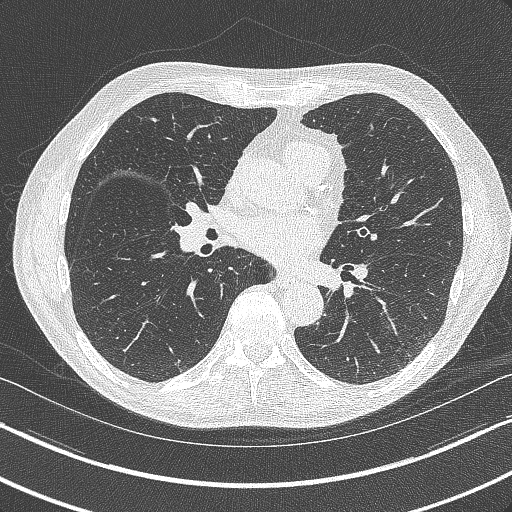
[im 172/344  lung]
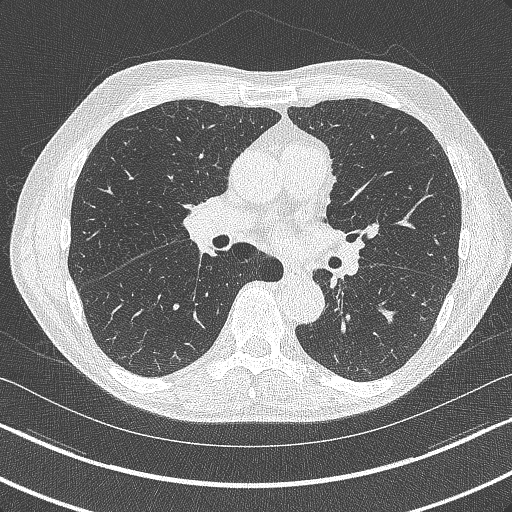
[im 188/344  mediastinal]
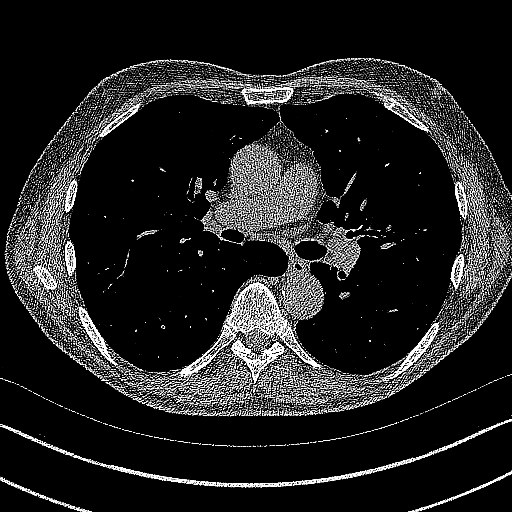
[im 188/344  lung]
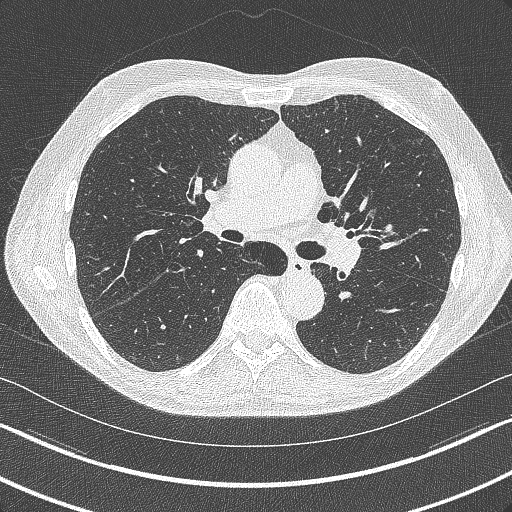
[im 219/344  lung]
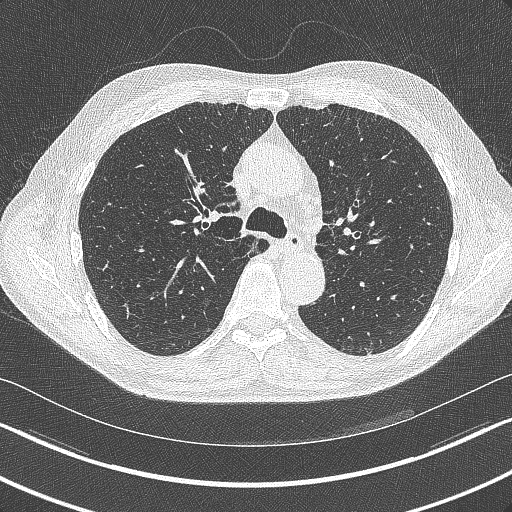
[im 229/344  lung]
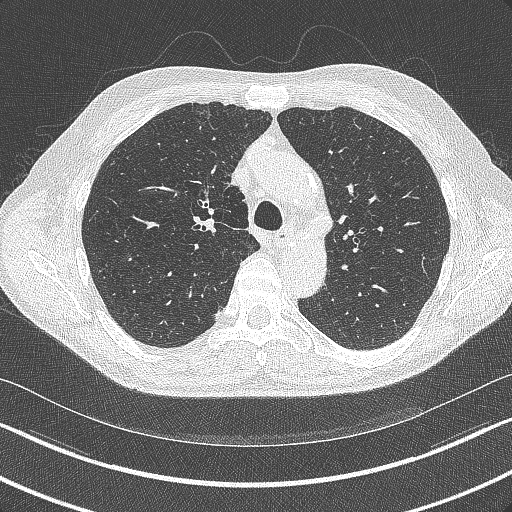
[im 250/344  lung]
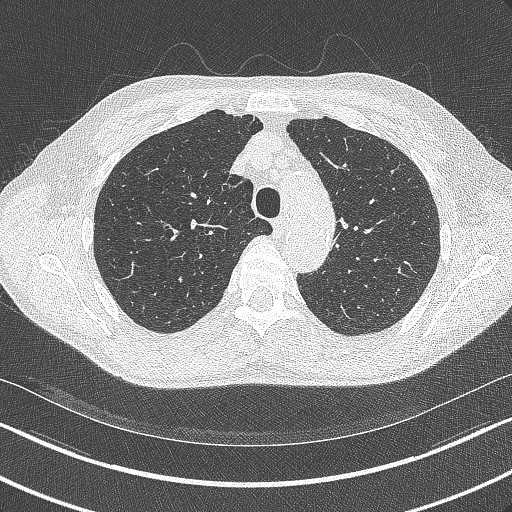
[im 281/344  mediastinal]
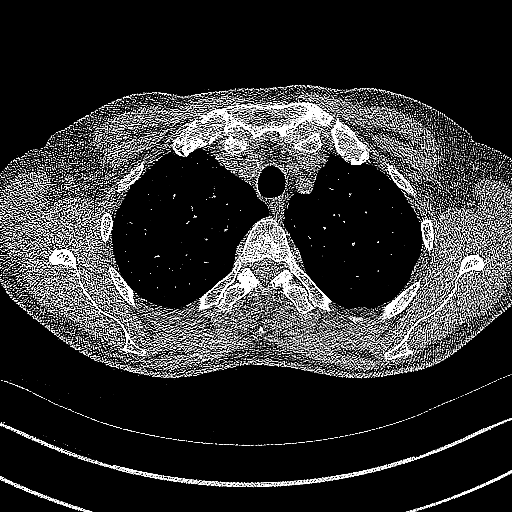
[im 281/344  lung]
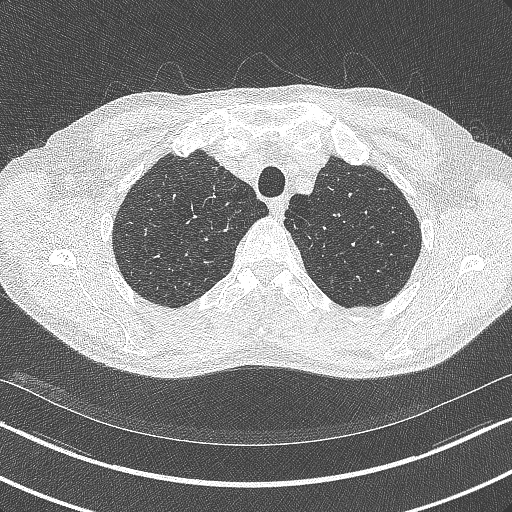
[im 297/344  lung]
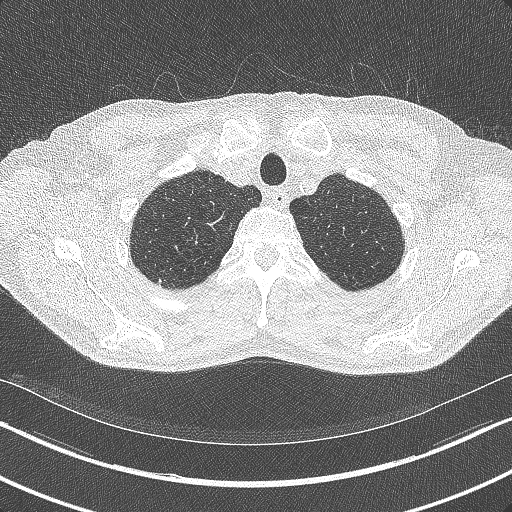
[im 328/344  lung]
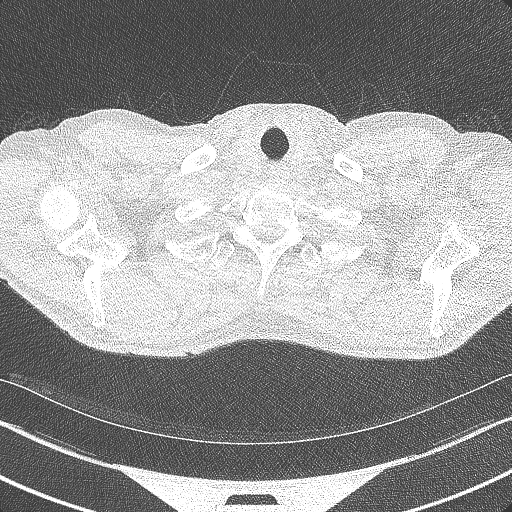

[15 of 40 positions shown; findings below may reference images not displayed]

FINDINGS: Cardiovascular: Heart is normal in size.  No pericardial effusion.

Three vessel coronary atherosclerosis.

No evidence of thoracic aortic aneurysm. Mild atherosclerotic
calcifications of the aortic arch.

Mediastinum/Nodes: No suspicious mediastinal lymphadenopathy.

Visualized thyroid is unremarkable.

Lungs/Pleura: Mild biapical pleural-parenchymal scarring.

Mild centrilobular and paraseptal emphysematous changes, upper lobe
predominant.

Mild subpleural reticulation.

Two right lung nodules measuring up to 5.6 mm, one of which is
partially calcified, unchanged.

No focal consolidation.

No pleural effusion or pneumothorax.

Upper Abdomen: Visualized upper abdomen is notable for
cholecystectomy clips and a 1.8 cm benign adrenal adenoma.

Musculoskeletal: Visualized osseous structures are within normal
limits.
IMPRESSION: Lung-RADS 2, benign appearance or behavior. Continue annual
screening with low-dose chest CT without contrast in 12 months.

## 2018-06-21 MED ORDER — OXYCODONE HCL 15 MG PO TABS: 15.0000 mg | ORAL_TABLET | Freq: Four times a day (QID) | ORAL | 0 refills | Status: DC | PRN

## 2018-07-04 ENCOUNTER — Ambulatory Visit (INDEPENDENT_AMBULATORY_CARE_PROVIDER_SITE_OTHER): Payer: PPO

## 2018-07-04 ENCOUNTER — Other Ambulatory Visit: Payer: Self-pay

## 2018-07-04 DIAGNOSIS — Z0000 Encounter for general adult medical examination without abnormal findings: Secondary | ICD-10-CM

## 2018-07-04 NOTE — Progress Notes (Signed)
Subjective:   Justin Harrington is a 73 y.o. male who presents for Medicare Annual/Subsequent preventive examination.    This visit is being conducted through telemedicine due to the COVID-19 pandemic. This patient has given me verbal consent via doximity to conduct this visit, patient states they are participating from their home address. Some vital signs may be absent or patient reported.    Patient identification: identified by name, DOB, and current address  Review of Systems:  N/A  Cardiac Risk Factors include: advanced age (>73men, >52 women);smoking/ tobacco exposure;hypertension;male gender     Objective:    Vitals: There were no vitals taken for this visit.  There is no height or weight on file to calculate BMI. Unable to obtain vitals due to visit being conducted via telephonically.   Advanced Directives 07/04/2018 03/29/2018 03/29/2018 03/27/2018 06/20/2017 04/26/2017 06/18/2016  Does Patient Have a Medical Advance Directive? Yes Yes Yes No No Yes Yes  Type of Paramedic of [place];Living will [place];Living will - - - [place];Living will [place];Living will  Does patient want to make changes to medical advance directive? - No - Patient declined No - Patient declined - - - -  Copy of [place] in Chart? No - copy requested No - copy requested - - - No - copy requested -  Would patient like information on creating a medical advance directive? - - No - Patient declined No - Patient declined No - Patient declined - -    Tobacco Social History   Tobacco Use  Smoking Status Current Every Day Smoker  . Packs/day: 0.75  . Years: 50.00  . Pack years: 37.50  . Types: Cigarettes  Smokeless Tobacco Never Used     Ready to quit: No Counseling given: No   Clinical Intake:  Pre-visit preparation completed: Yes  Pain : No/denies pain     Nutritional Status: BMI of  19-24  Normal Nutritional Risks: None Diabetes: No  How often do you need to have someone help you when you read instructions, pamphlets, or other written materials from your doctor or pharmacy?: 1 - Never  Interpreter Needed?: No  Information entered by :: [facility], LPN  Past Medical History:  Diagnosis Date  . Aortic aneurysm ([place])   . Coronary artery disease   . DVT (deep venous thrombosis) ([place])    H/O DVT in left leg, filter placed approximatly 8 yrs ago  . H/O spinal fusion 1998   Back pain  . Hypertension    controlled on meds  . Personal history of tobacco use, presenting hazards to health 06/12/2015  . Personal history of tobacco use, presenting hazards to health 06/12/2015  . Prostate cancer [facility])    Had prostatectomy  . Wears dentures    upper   Past Surgical History:  Procedure Laterality Date  . BACK SURGERY  1998   Lumbar spine fusion  . carotid doppler ultrasound  10/05/2011   75-90% RCA occlusion, 50% on left  . CAROTID ENDARTERECTOMY Right 12/02/2011   Dr. Lucky Cowboy  . CHOLECYSTECTOMY  10/02/2010   Laparoscopic, Dr. Pat Patrick, [facility]  . COLONOSCOPY WITH PROPOFOL N/A 06/20/2017   Procedure: COLONOSCOPY WITH PROPOFOL;  Surgeon: [name], MD;  Location: [facility];  Service: Endoscopy;  Laterality: N/A;  . 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 Cholelithiasis and acute cholecystiis. Mild enlargement of left adrenal  gland  . ENDOVASCULAR REPAIR/STENT GRAFT N/A 03/29/2018   Procedure: ENDOVASCULAR REPAIR/STENT GRAFT;  Surgeon: Algernon Huxley, MD;  Location: [facility] CV [facility];  Service: Cardiovascular;  Laterality: N/A;  . LUMBAR SPINE HARDWARE REMOVAL  07/30/2016   [name], MD  . PROSTATE SURGERY  10/19/2012   prostatectomy. [facility]; Laser assisted, Done by [name] for prostate cancer   Family History  Problem Relation Age of Onset  . Heart attack Mother   .  Congestive Heart Failure Father   . Cerebral palsy Son    Social History   Socioeconomic History  . Marital status: Married    Spouse name: Not on file  . Number of children: 2  . Years of education: HS Diploma  . Highest education level: 12th grade  Occupational History  . Occupation: Retired  Scientific laboratory technician  . Financial resource strain: Not hard at all  . Food insecurity:    Worry: Never true    Inability: Never true  . Transportation needs:    Medical: No    Non-medical: No  Tobacco Use  . Smoking status: Current Every Day Smoker    Packs/day: 0.75    Years: 50.00    Pack years: 37.50    Types: Cigarettes  . Smokeless tobacco: Never Used  Substance and Sexual Activity  . Alcohol use: No    Alcohol/week: 0.0 standard drinks  . Drug use: No  . Sexual activity: Not on file  Lifestyle  . Physical activity:    Days per week: 0 days    Minutes per session: 0 min  . Stress: Not at all  Relationships  . Social connections:    Talks on phone: Patient refused    Gets together: Patient refused    Attends religious service: Patient refused    Active member of club or organization: Patient refused    Attends meetings of clubs or organizations: Patient refused    Relationship status: Patient refused  Other Topics Concern  . Not on file  Social History Narrative   Son passed at age 65 from cerebral palsy.    Outpatient Encounter Medications as of 07/04/2018  Medication Sig  . acetaminophen (TYLENOL) 325 MG tablet Take 1-2 tablets (325-650 mg total) by mouth every 6 (six) hours as needed for mild pain (or temp >/= 101 F).  Marland Kitchen aspirin EC 81 MG tablet Take 81 mg by mouth every evening. Alternates EOD with aspirin.  Marland Kitchen atorvastatin (LIPITOR) 80 MG tablet Take 1 tablet (80 mg total) by mouth daily.  . clopidogrel (PLAVIX) 75 MG tablet Take 1 tablet (75 mg total) by mouth daily. (Patient taking differently: Take 75 mg by mouth daily. Alternates EOD with aspirin.)  . doxylamine,  Sleep, (UNISOM) 25 MG tablet Take 25 mg by mouth at bedtime as needed for sleep.   . hydrochlorothiazide (HYDRODIURIL) 25 MG tablet TAKE 1 TABLET(25 MG) BY MOUTH DAILY (Patient taking differently: Take 25 mg by mouth daily. )  . oxyCODONE (ROXICODONE) 15 MG immediate release tablet Take 1 tablet (15 mg total) by mouth 4 (four) times daily as needed for pain.  . Sennosides (EX-LAX PO) Take 3-4 tablets by mouth every 3 (three) days as needed (constipation).   . terbinafine (LAMISIL) 250 MG tablet Take 2 tablets (500 mg total) by mouth daily. Take for 1 week out of every 4 weeks  . traZODone (DESYREL) 150 MG tablet TAKE 1 TABLET(150 MG) BY MOUTH AT BEDTIME AS NEEDED (Patient taking differently:  Take 50 mg by mouth at bedtime as needed for sleep. )   No facility-administered encounter medications on file as of 07/04/2018.     Activities of Daily Living In your present state of health, do you have any difficulty performing the following activities: 07/04/2018 03/29/2018  Hearing? N N  Vision? N N  Comment Wears eye glasses daily.  -  Difficulty concentrating or making decisions? N N  Walking or climbing stairs? N N  Dressing or bathing? N N  Doing errands, shopping? N N  Preparing Food and eating ? N -  Using the Toilet? N -  In the past six months, have you accidently leaked urine? Y -  Comment Due to prostate removal- wears protection.  -  Do you have problems with loss of bowel control? N -  Managing your Medications? N -  Managing your Finances? N -  Housekeeping or managing your Housekeeping? N -  Some recent data might be hidden    Patient Care Team: [name], MD as PCP - General (Family Medicine) Dew, Erskine Squibb, MD as Referring Physician (Vascular Surgery) Pa, [place] as Consulting Physician (Optometry) Ubaldo Glassing, Javier Docker, MD as Consulting Physician (Cardiology)   Assessment:   This is a routine wellness examination for Jervon.  Exercise Activities and Dietary  recommendations Current Exercise Habits: The patient does not participate in regular exercise at present, Exercise limited by: None identified  Goals    . DIET - INCREASE WATER INTAKE     Recommend to start drinking 3 glasses of water a day.     . Quit Smoking     Recommend to continue efforts to reduce smoking habits until no longer smoking (Smoking Cessation literature attached to AVS).         Fall Risk: Fall Risk  07/04/2018 04/26/2017 06/07/2016 06/03/2016 04/22/2016  Falls in the past year? 0 No No No Yes  Number falls in past yr: - - - - 1  Injury with Fall? - - - - Yes  Risk Factor Category  - - - - High Fall Risk  Risk for fall due to : - - - - -  Follow up - - - - Falls prevention discussed;Education provided  Comment - - - - -    FALL RISK PREVENTION PERTAINING TO THE HOME:  Any stairs in or around the home? Yes  If so, are there any without handrails? Yes   Home free of loose throw rugs in walkways, pet beds, electrical cords, etc? Yes  Adequate lighting in your home to reduce risk of falls? Yes   ASSISTIVE DEVICES UTILIZED TO PREVENT FALLS:  Life alert? No  Use of a cane, walker or w/c? Yes  Grab bars in the bathroom? Yes  Shower chair or bench in shower? Yes  Elevated toilet seat or a handicapped toilet? Yes   TIMED UP AND GO:  Was the test performed? No .    Depression Screen PHQ 2/9 Scores 07/04/2018 07/04/2018 04/26/2017 06/07/2016  PHQ - 2 Score 0 0 0 0  PHQ- 9 Score 2 - - -  Exception Documentation - - - -    Cognitive Function: Declined today.         Immunization History  Administered Date(s) Administered  . Influenza, High Dose Seasonal PF 10/12/2016, 11/03/2017  . Influenza-Unspecified 12/01/2014  . Pneumococcal Conjugate-13 05/08/2013  . Pneumococcal Polysaccharide-23 10/02/2010    Qualifies for Shingles Vaccine? Yes . Due for Shingrix. Education has been  provided regarding the importance of this vaccine. Pt has been advised to call  insurance company to determine out of pocket expense. Advised may also receive vaccine at local pharmacy or Health Dept. Verbalized acceptance and understanding.  Tdap: Although this vaccine is not a covered service during a Wellness Exam, does the patient still wish to receive this vaccine today?  No . Advised may receive this vaccine at local pharmacy or Health Dept. Aware to provide a copy of the vaccination record if obtained from local pharmacy or Health Dept. Verbalized acceptance and understanding.  Flu Vaccine: Up to date  Pneumococcal Vaccine: Due for Pneumococcal vaccine. Does the patient want to receive this vaccine today?  No . Advised may receive this vaccine at local pharmacy or Health Dept. Aware to provide a copy of the vaccination record if obtained from local pharmacy or Health Dept. Verbalized acceptance and understanding.   Screening Tests Health Maintenance  Topic Date Due  . TETANUS/TDAP  09/08/1965  . PNA vac Low Risk Adult (2 of 2 - PPSV23) 10/02/2015  . INFLUENZA VACCINE  09/02/2018  . Fecal DNA (Cologuard)  05/16/2020  . Hepatitis C Screening  Completed   Cancer Screenings:  Colorectal Screening: Cologuard completed 05/16/17. Repeat every 3 years.   Lung Cancer Screening: (Low Dose CT Chest recommended if Age 53-80 years, 30 pack-year currently smoking OR have quit w/in 15years.) does qualify, however is up to date.   Additional Screening:  Hepatitis C Screening: Up to date  Vision Screening: Recommended annual ophthalmology exams for early detection of glaucoma and other disorders of the eye.  Dental Screening: Recommended annual dental exams for proper oral hygiene  Community Resource Referral:  CRR required this visit?  No        Plan:  I have personally reviewed and addressed the Medicare Annual Wellness questionnaire and have noted the following in the patient's chart:  A. Medical and social history B. Use of alcohol, tobacco or illicit drugs  C.  Current medications and supplements D. Functional ability and status E.  Nutritional status F.  Physical activity G. Advance directives H. List of other physicians I.  Hospitalizations, surgeries, and ER visits in previous 12 months J.  [place] such as hearing and vision if needed, cognitive and depression L. Referrals and appointments   In addition, I have reviewed and discussed with patient certain preventive protocols, quality metrics, and best practice recommendations. A written personalized care plan for preventive services as well as general preventive health recommendations were provided to patient.   Glendora Score, [place]  04/05/1934 Nurse Health Advisor   Nurse Notes: Pt would like to receive the Prevnar 13 vaccine at next in office visit.

## 2018-07-04 NOTE — Patient Instructions (Addendum)
Justin Harrington , Thank you for taking time to come for your Medicare Wellness Visit. I appreciate your ongoing commitment to your health goals. Please review the following plan we discussed and let me know if I can assist you in the future.   Screening recommendations/referrals: Colonoscopy: Cologuard due 05/16/20 Recommended yearly ophthalmology/optometry visit for glaucoma screening and checkup Recommended yearly dental visit for hygiene and checkup  Vaccinations: Influenza vaccine: Up to date Pneumococcal vaccine: Pt declines Pneumovax today.  Tdap vaccine: Pt declines today.  Shingles vaccine: Pt declines today.     Advanced directives: Please bring a copy of your POA (Power of Attorney) and/or Living Will to your next appointment.   Conditions/risks identified: Continue trying to increase water intake to 6-8 8 oz glasses of water a day.   Next appointment: 09/27/18 @ 10:00 AM with Dr Caryn Section.   Preventive Care 72 Years and Older, Male Preventive care refers to lifestyle choices and visits with your health care provider that can promote health and wellness. What does preventive care include?  A yearly physical exam. This is also called an annual well check.  Dental exams once or twice a year.  Routine eye exams. Ask your health care provider how often you should have your eyes checked.  Personal lifestyle choices, including:  Daily care of your teeth and gums.  Regular physical activity.  Eating a healthy diet.  Avoiding tobacco and drug use.  Limiting alcohol use.  Practicing safe sex.  Taking low doses of aspirin every day.  Taking vitamin and mineral supplements as recommended by your health care provider. What happens during an annual well check? The services and screenings done by your health care provider during your annual well check will depend on your age, overall health, lifestyle risk factors, and family history of disease. Counseling  Your health care  provider may ask you questions about your:  Alcohol use.  Tobacco use.  Drug use.  Emotional well-being.  Home and relationship well-being.  Sexual activity.  Eating habits.  History of falls.  Memory and ability to understand (cognition).  Work and work Statistician. Screening  You may have the following tests or measurements:  Height, weight, and BMI.  Blood pressure.  Lipid and cholesterol levels. These may be checked every 5 years, or more frequently if you are over 58 years old.  Skin check.  Lung cancer screening. You may have this screening every year starting at age 40 if you have a 30-pack-year history of smoking and currently smoke or have quit within the past 15 years.  Fecal occult blood test (FOBT) of the stool. You may have this test every year starting at age 29.  Flexible sigmoidoscopy or colonoscopy. You may have a sigmoidoscopy every 5 years or a colonoscopy every 10 years starting at age 52.  Prostate cancer screening. Recommendations will vary depending on your family history and other risks.  Hepatitis C blood test.  Hepatitis B blood test.  Sexually transmitted disease (STD) testing.  Diabetes screening. This is done by checking your blood sugar (glucose) after you have not eaten for a while (fasting). You may have this done every 1-3 years.  Abdominal aortic aneurysm (AAA) screening. You may need this if you are a current or former smoker.  Osteoporosis. You may be screened starting at age 87 if you are at high risk. Talk with your health care provider about your test results, treatment options, and if necessary, the need for more tests. Vaccines  Your  health care provider may recommend certain vaccines, such as:  Influenza vaccine. This is recommended every year.  Tetanus, diphtheria, and acellular pertussis (Tdap, Td) vaccine. You may need a Td booster every 10 years.  Zoster vaccine. You may need this after age 53.  Pneumococcal  13-valent conjugate (PCV13) vaccine. One dose is recommended after age 19.  Pneumococcal polysaccharide (PPSV23) vaccine. One dose is recommended after age 65. Talk to your health care provider about which screenings and vaccines you need and how often you need them. This information is not intended to replace advice given to you by your health care provider. Make sure you discuss any questions you have with your health care provider. Document Released: 02/14/2015 Document Revised: 10/08/2015 Document Reviewed: 11/19/2014 Elsevier Interactive Patient Education  2017 [place] Prevention in the Home Falls can cause injuries. They can happen to people of all ages. There are many things you can do to make your home safe and to help prevent falls. What can I do on the outside of my home?  Regularly fix the edges of walkways and driveways and fix any cracks.  Remove anything that might make you trip as you walk through a door, such as a raised step or threshold.  Trim any bushes or trees on the path to your home.  Use bright outdoor lighting.  Clear any walking paths of anything that might make someone trip, such as rocks or tools.  Regularly check to see if handrails are loose or broken. Make sure that both sides of any steps have handrails.  Any raised decks and porches should have guardrails on the edges.  Have any leaves, snow, or ice cleared regularly.  Use sand or salt on walking paths during winter.  Clean up any spills in your garage right away. This includes oil or grease spills. What can I do in the bathroom?  Use night lights.  Install grab bars by the toilet and in the tub and shower. Do not use towel bars as grab bars.  Use non-skid mats or decals in the tub or shower.  If you need to sit down in the shower, use a plastic, non-slip stool.  Keep the floor dry. Clean up any water that spills on the floor as soon as it happens.  Remove soap buildup in the  tub or shower regularly.  Attach bath mats securely with double-sided non-slip rug tape.  Do not have throw rugs and other things on the floor that can make you trip. What can I do in the bedroom?  Use night lights.  Make sure that you have a light by your bed that is easy to reach.  Do not use any sheets or blankets that are too big for your bed. They should not hang down onto the floor.  Have a firm chair that has side arms. You can use this for support while you get dressed.  Do not have throw rugs and other things on the floor that can make you trip. What can I do in the kitchen?  Clean up any spills right away.  Avoid walking on wet floors.  Keep items that you use a lot in easy-to-reach places.  If you need to reach something above you, use a strong step stool that has a grab bar.  Keep electrical cords out of the way.  Do not use floor polish or wax that makes floors slippery. If you must use wax, use non-skid floor wax.  Do not  have throw rugs and other things on the floor that can make you trip. What can I do with my stairs?  Do not leave any items on the stairs.  Make sure that there are handrails on both sides of the stairs and use them. Fix handrails that are broken or loose. Make sure that handrails are as long as the stairways.  Check any carpeting to make sure that it is firmly attached to the stairs. Fix any carpet that is loose or worn.  Avoid having throw rugs at the top or bottom of the stairs. If you do have throw rugs, attach them to the floor with carpet tape.  Make sure that you have a light switch at the top of the stairs and the bottom of the stairs. If you do not have them, ask someone to add them for you. What else can I do to help prevent falls?  Wear shoes that:  Do not have high heels.  Have rubber bottoms.  Are comfortable and fit you well.  Are closed at the toe. Do not wear sandals.  If you use a stepladder:  Make sure that it  is fully opened. Do not climb a closed stepladder.  Make sure that both sides of the stepladder are locked into place.  Ask someone to hold it for you, if possible.  Clearly mark and make sure that you can see:  Any grab bars or handrails.  First and last steps.  Where the edge of each step is.  Use tools that help you move around (mobility aids) if they are needed. These include:  Canes.  Walkers.  Scooters.  Crutches.  Turn on the lights when you go into a dark area. Replace any light bulbs as soon as they burn out.  Set up your furniture so you have a clear path. Avoid moving your furniture around.  If any of your floors are uneven, fix them.  If there are any pets around you, be aware of where they are.  Review your medicines with your doctor. Some medicines can make you feel dizzy. This can increase your chance of falling. Ask your doctor what other things that you can do to help prevent falls. This information is not intended to replace advice given to you by your health care provider. Make sure you discuss any questions you have with your health care provider. Document Released: 11/14/2008 Document Revised: 06/26/2015 Document Reviewed: 02/22/2014 Elsevier Interactive Patient Education  2017 [employer].

## 2018-07-21 ENCOUNTER — Other Ambulatory Visit: Payer: Self-pay | Admitting: *Deleted

## 2018-07-21 DIAGNOSIS — G8929 Other chronic pain: Secondary | ICD-10-CM

## 2018-07-21 DIAGNOSIS — M5442 Lumbago with sciatica, left side: Secondary | ICD-10-CM

## 2018-07-21 MED ORDER — OXYCODONE HCL 15 MG PO TABS: 15.0000 mg | ORAL_TABLET | Freq: Four times a day (QID) | ORAL | 0 refills | Status: DC | PRN

## 2018-08-02 ENCOUNTER — Telehealth: Payer: Self-pay | Admitting: *Deleted

## 2018-08-02 NOTE — Telephone Encounter (Signed)
Attempted to contact regarding scheduling lung screening scan, however, there is no answer or voicemail option at either # in EMR.

## 2018-08-08 ENCOUNTER — Telehealth: Payer: Self-pay | Admitting: *Deleted

## 2018-08-08 NOTE — Telephone Encounter (Signed)
Left message for patient to notify them that it is time to schedule annual low dose lung cancer screening CT scan. Instructed patient to call back to verify information prior to the scan being scheduled.  

## 2018-08-09 ENCOUNTER — Telehealth: Payer: Self-pay | Admitting: *Deleted

## 2018-08-09 DIAGNOSIS — Z122 Encounter for screening for malignant neoplasm of respiratory organs: Secondary | ICD-10-CM

## 2018-08-09 DIAGNOSIS — Z87891 Personal history of nicotine dependence: Secondary | ICD-10-CM

## 2018-08-09 NOTE — Telephone Encounter (Signed)
Patient has been notified that annual lung cancer screening low dose CT scan is due currently or will be in near future. Confirmed that patient is within the age range of 55-77, and asymptomatic, (no signs or symptoms of lung cancer). Patient denies illness that would prevent curative treatment for lung cancer if found. Verified smoking history, (current, 39 pack year). The shared decision making visit was done 06/13/15. Patient is agreeable for CT scan being scheduled.

## 2018-08-15 ENCOUNTER — Ambulatory Visit
Admission: RE | Admit: 2018-08-15 | Discharge: 2018-08-15 | Disposition: A | Discharge status: Home / Self Care | Payer: PPO | Source: Ambulatory Visit | Attending: Oncology | Admitting: Oncology

## 2018-08-15 ENCOUNTER — Other Ambulatory Visit: Payer: Self-pay

## 2018-08-15 DIAGNOSIS — Z122 Encounter for screening for malignant neoplasm of respiratory organs: Secondary | ICD-10-CM | POA: Insufficient documentation

## 2018-08-15 DIAGNOSIS — Z87891 Personal history of nicotine dependence: Secondary | ICD-10-CM | POA: Insufficient documentation

## 2018-08-15 DIAGNOSIS — F17210 Nicotine dependence, cigarettes, uncomplicated: Secondary | ICD-10-CM | POA: Diagnosis not present

## 2018-08-16 ENCOUNTER — Encounter: Payer: Self-pay | Admitting: Family Medicine

## 2018-08-16 ENCOUNTER — Encounter: Payer: Self-pay | Admitting: *Deleted

## 2018-08-16 DIAGNOSIS — I700 Atherosclerosis of aorta: Secondary | ICD-10-CM | POA: Insufficient documentation

## 2018-08-20 ENCOUNTER — Other Ambulatory Visit: Payer: Self-pay | Admitting: Family Medicine

## 2018-08-20 DIAGNOSIS — E7800 Pure hypercholesterolemia, unspecified: Secondary | ICD-10-CM

## 2018-08-21 ENCOUNTER — Other Ambulatory Visit: Payer: Self-pay | Admitting: Family Medicine

## 2018-08-21 DIAGNOSIS — G8929 Other chronic pain: Secondary | ICD-10-CM

## 2018-08-21 DIAGNOSIS — M5442 Lumbago with sciatica, left side: Secondary | ICD-10-CM

## 2018-08-21 MED ORDER — OXYCODONE HCL 15 MG PO TABS: 15.0000 mg | ORAL_TABLET | Freq: Four times a day (QID) | ORAL | 0 refills | Status: DC | PRN

## 2018-08-21 NOTE — Telephone Encounter (Signed)
Pt needs a refill his oxycodone 15  [facility]  [address]

## 2018-09-20 ENCOUNTER — Other Ambulatory Visit: Payer: Self-pay | Admitting: Family Medicine

## 2018-09-20 DIAGNOSIS — M5442 Lumbago with sciatica, left side: Secondary | ICD-10-CM

## 2018-09-20 DIAGNOSIS — G8929 Other chronic pain: Secondary | ICD-10-CM

## 2018-09-20 MED ORDER — OXYCODONE HCL 15 MG PO TABS: 15.0000 mg | ORAL_TABLET | Freq: Four times a day (QID) | ORAL | 0 refills | Status: DC | PRN

## 2018-09-20 NOTE — Telephone Encounter (Signed)
Pt needing a refill on:  oxyCODONE (ROXICODONE) 15 MG immediate release tablet   Please fill at:  [facility], [place] [address] AT [place] [phone] (Phone) [phone] (Fax)    Thanks, [employer]

## 2018-09-27 ENCOUNTER — Other Ambulatory Visit: Payer: Self-pay

## 2018-09-27 ENCOUNTER — Encounter: Payer: Self-pay | Admitting: Family Medicine

## 2018-09-27 ENCOUNTER — Ambulatory Visit (INDEPENDENT_AMBULATORY_CARE_PROVIDER_SITE_OTHER): Payer: PPO | Admitting: Family Medicine

## 2018-09-27 VITALS — BP 122/66 | HR 92 | Temp 96.9°F | Resp 16 | Ht 73.0 in | Wt 161.1 lb

## 2018-09-27 DIAGNOSIS — Z23 Encounter for immunization: Secondary | ICD-10-CM | POA: Diagnosis not present

## 2018-09-27 DIAGNOSIS — Z125 Encounter for screening for malignant neoplasm of prostate: Secondary | ICD-10-CM

## 2018-09-27 DIAGNOSIS — E7800 Pure hypercholesterolemia, unspecified: Secondary | ICD-10-CM | POA: Diagnosis not present

## 2018-09-27 DIAGNOSIS — I10 Essential (primary) hypertension: Secondary | ICD-10-CM

## 2018-09-27 DIAGNOSIS — G4700 Insomnia, unspecified: Secondary | ICD-10-CM

## 2018-09-27 DIAGNOSIS — Z0000 Encounter for general adult medical examination without abnormal findings: Secondary | ICD-10-CM

## 2018-09-27 NOTE — Progress Notes (Signed)
Patient: Justin Harrington, Male    DOB: 05-Nov-1945, 73 y.o.   MRN: [id number] Visit Date: 09/27/2018  Today's Provider: [name], MD   Chief Complaint  Patient presents with  . Annual Exam   Subjective:     Complete Physical Justin Harrington is a 73 y.o. male. He feels well. He reports exercising active with daily activites. He reports he is sleeping well. 07/04/2018 AWV with [facility] 06/20/2017 Colonoscopy-int. Hemorrhaids, repeat in 10 yrs ----------------------------------------------------------- Continues to have persistent back pain, current medications remain effective. Takes trazodone consistently every night to help sleep, but sometimes has to take 2.   Review of Systems  Constitutional: Positive for activity change, appetite change, fatigue and unexpected weight change.  HENT: Negative.   Eyes: Negative.   Respiratory: Negative.   Cardiovascular: Positive for leg swelling.  Gastrointestinal: Positive for constipation.  Endocrine: Negative.   Genitourinary: Negative.   Musculoskeletal: Positive for back pain.  Skin: Negative.   Allergic/Immunologic: Negative.   Neurological: Positive for dizziness.  Hematological: Negative.   Psychiatric/Behavioral: Positive for sleep disturbance.    Social History   Socioeconomic History  . Marital status: Married    Spouse name: Not on file  . Number of children: 2  . Years of education: HS Diploma  . Highest education level: 12th grade  Occupational History  . Occupation: Retired  Scientific laboratory technician  . Financial resource strain: Not hard at all  . Food insecurity    Worry: Never true    Inability: Never true  . Transportation needs    Medical: No    Non-medical: No  Tobacco Use  . Smoking status: Current Every Day Smoker    Packs/day: 0.75    Years: 50.00    Pack years: 37.50    Types: Cigarettes  . Smokeless tobacco: Never Used  Substance and Sexual Activity  . Alcohol use: No    Alcohol/week: 0.0  standard drinks  . Drug use: No  . Sexual activity: Not on file  Lifestyle  . Physical activity    Days per week: 0 days    Minutes per session: 0 min  . Stress: Not at all  Relationships  . Social Herbalist on phone: Patient refused    Gets together: Patient refused    Attends religious service: Patient refused    Active member of club or organization: Patient refused    Attends meetings of clubs or organizations: Patient refused    Relationship status: Patient refused  . Intimate partner violence    Fear of current or ex partner: Patient refused    Emotionally abused: Patient refused    Physically abused: Patient refused    Forced sexual activity: Patient refused  Other Topics Concern  . Not on file  Social History Narrative   Son passed at age 12 from cerebral palsy.    Past Medical History:  Diagnosis Date  . Aortic aneurysm ([place])   . Coronary artery disease   . DVT (deep venous thrombosis) ([place])    H/O DVT in left leg, filter placed approximatly 8 yrs ago  . H/O spinal fusion 1998   Back pain  . Hypertension    controlled on meds  . Personal history of tobacco use, presenting hazards to health 06/12/2015  . Personal history of tobacco use, presenting hazards to health 06/12/2015  . Prostate cancer [facility])    Had prostatectomy  . Wears dentures    upper  Patient Active Problem List   Diagnosis Date Noted  . 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 Positive colorectal cancer screening using Cologuard test   . Coronary artery disease 07/02/2016  . Neuropathic pain 03/30/2016  . Urinary hesitancy 01/01/2016  . H/O prostatectomy 01/01/2016  . AAA (abdominal aortic aneurysm) without rupture ([place]) 12/17/2015  . Hypokalemia 12/10/2015  . Hypomagnesemia 12/10/2015  . Chronic sacroiliac joint pain (Bilateral) (R>L) 12/10/2015  . Chronic hip pain (Bilateral) (R>L) 12/10/2015  . Chronic pain syndrome 12/10/2015  . Long term current use of opiate  analgesic 10/16/2015  . Long term prescription opiate use 10/16/2015  . Opiate use (90 MME/Day) 10/16/2015  . Encounter for therapeutic drug level monitoring 10/16/2015  . Chronic low back pain (Location of Primary Source of Pain) (Bilateral) (R>L) 10/16/2015  . 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 History of lumbar fusion (L4-S1 posterior hardware) 10/16/2015  . Lumbar spondylosis 10/16/2015  . Lumbar facet syndrome (Bilateral) (R>L) 10/16/2015  . Opioid-induced constipation (OIC) 06/05/2015  . Cataract of left eye 09/03/2014  . History of DVT (deep vein thrombosis) 09/03/2014  . History of prostate cancer 09/03/2014  . Hypercholesteremia 07/24/2014  . Hypertension 07/24/2014  . Carotid arterial disease ([place]) 09/02/2010  . Diverticulosis of colon without hemorrhage 07/11/2006  . Insomnia 06/29/2006  . Smoking greater than 30 pack years 02/01/2006  . Arthrodesis status 02/02/1996    Past Surgical History:  Procedure Laterality Date  . BACK SURGERY  1998   Lumbar spine fusion  . carotid doppler ultrasound  10/05/2011   75-90% RCA occlusion, 50% on left  . CAROTID ENDARTERECTOMY Right 12/02/2011   Dr. Lucky Cowboy  . CHOLECYSTECTOMY  10/02/2010   Laparoscopic, Dr. Pat Patrick, [facility]  . COLONOSCOPY WITH PROPOFOL N/A 06/20/2017   Procedure: COLONOSCOPY WITH PROPOFOL;  Surgeon: [name], MD;  Location: [place];  Service: Endoscopy;  Laterality: N/A;  . 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 Cholelithiasis and acute cholecystiis. Mild enlargement of left adrenal gland  . ENDOVASCULAR REPAIR/STENT GRAFT N/A 03/29/2018   Procedure: ENDOVASCULAR REPAIR/STENT GRAFT;  Surgeon: Algernon Huxley, MD;  Location: [place] [facility];  Service: Cardiovascular;  Laterality: N/A;  . LUMBAR SPINE HARDWARE REMOVAL  07/30/2016   [name], MD  . PROSTATE SURGERY  10/19/2012   prostatectomy. [facility]; Laser  assisted, Done by [name] for prostate cancer    His family history includes Cerebral palsy in his son; Congestive Heart Failure in his father; Heart attack in his mother.   Current Outpatient Medications:  .  acetaminophen (TYLENOL) 325 MG tablet, Take 1-2 tablets (325-650 mg total) by mouth every 6 (six) hours as needed for mild pain (or temp >/= 101 F)., Disp: , Rfl:  .  aspirin EC 81 MG tablet, Take 81 mg by mouth every evening. Alternates EOD with aspirin., Disp: , Rfl:  .  atorvastatin (LIPITOR) 80 MG tablet, TAKE 1 TABLET(80 MG) BY MOUTH DAILY, Disp: 90 tablet, Rfl: 4 .  clopidogrel (PLAVIX) 75 MG tablet, Take 1 tablet (75 mg total) by mouth daily. (Patient taking differently: Take 75 mg by mouth daily. Alternates EOD with aspirin.), Disp: 90 tablet, Rfl: 3 .  doxylamine, Sleep, (UNISOM) 25 MG tablet, Take 25 mg by mouth at bedtime as needed for sleep. , Disp: , Rfl:  .  hydrochlorothiazide (HYDRODIURIL) 25 MG tablet, Take 1 tablet (25 mg total) by mouth daily., Disp: 90  tablet, Rfl: 4 .  oxyCODONE (ROXICODONE) 15 MG immediate release tablet, Take 1 tablet (15 mg total) by mouth 4 (four) times daily as needed for pain., Disp: 120 tablet, Rfl: 0 .  Sennosides (EX-LAX PO), Take 3-4 tablets by mouth every 3 (three) days as needed (constipation). , Disp: , Rfl:  .  terbinafine (LAMISIL) 250 MG tablet, Take 2 tablets (500 mg total) by mouth daily. Take for 1 week out of every 4 weeks, Disp: 14 tablet, Rfl: 11 .  traZODone (DESYREL) 150 MG tablet, Take 0.5-1 tablets (75-150 mg total) by mouth at bedtime as needed for sleep., Disp: 60 tablet, Rfl: 5 .  FLUZONE HIGH-DOSE QUADRIVALENT 0.7 ML SUSY, ADM 0.7ML IM UTD, Disp: , Rfl:   Patient Care Team: [name], MD as PCP - General (Family Medicine) Dew, Erskine Squibb, MD as Referring Physician (Vascular Surgery) Pa, [place] as Consulting Physician (Optometry) Ubaldo Glassing, Javier Docker, MD as Consulting Physician (Cardiology)      Objective:    Vitals: BP 122/66 (BP Location: Left Arm, Patient Position: Sitting, Cuff Size: Normal)   Pulse 92   Temp (!) 96.9 F (36.1 C) (Temporal)   Resp 16   Ht 6\' 1"  (1.854 m)   Wt 161 lb 1.6 oz (73.1 kg)   SpO2 96%   BMI 21.25 kg/m   Physical Exam   General Appearance:    Alert, cooperative, no distress, appears stated age  Head:    Normocephalic, without obvious abnormality, atraumatic  Eyes:    PERRL, conjunctiva/corneas clear, EOM's intact, fundi    benign, both eyes       Ears:    Normal TM's and external ear canals, both ears  Nose:   Nares normal, septum midline, mucosa normal, no drainage   or sinus tenderness  Throat:   Lips, mucosa, and tongue normal; teeth and gums normal  Neck:   Supple, symmetrical, trachea midline, no adenopathy;       thyroid:  No enlargement/tenderness/nodules; no carotid   bruit or JVD  Back:     Symmetric, no curvature, ROM normal, no CVA tenderness  Lungs:     Clear to auscultation bilaterally, respirations unlabored  Chest wall:    No tenderness or deformity  Heart:    Normal heart rate. Normal rhythm. No murmurs, rubs, or gallops.  S1 and S2 normal  Abdomen:     Soft, non-tender, bowel sounds active all four quadrants,    no masses, no organomegaly  Genitalia:    deferred  Rectal:    deferred  Extremities:   All extremities are intact. No cyanosis or edema  Pulses:   2+ and symmetric all extremities  Skin:   Skin color, texture, turgor normal, no rashes or lesions  Lymph nodes:   Cervical, supraclavicular, and axillary nodes normal  Neurologic:   CNII-XII intact. Normal strength, sensation and reflexes      throughout    Activities of Daily Living In your present state of health, do you have any difficulty performing the following activities: 07/04/2018 03/29/2018  Hearing? N N  Vision? N N  Comment Wears eye glasses daily.  -  Difficulty concentrating or making decisions? N N  Walking or climbing stairs? N N  Dressing or  bathing? N N  Doing errands, shopping? N N  Preparing Food and eating ? N -  Using the Toilet? N -  In the past six months, have you accidently leaked urine? Y -  Comment Due to  prostate removal- wears protection.  -  Do you have problems with loss of bowel control? N -  Managing your Medications? N -  Managing your Finances? N -  Housekeeping or managing your Housekeeping? N -  Some recent data might be hidden    Fall Risk Assessment Fall Risk  07/04/2018 04/26/2017 06/07/2016 06/03/2016 04/22/2016  Falls in the past year? 0 No No No Yes  Number falls in past yr: - - - - 1  Injury with Fall? - - - - Yes  Risk Factor Category  - - - - High Fall Risk  Risk for fall due to : - - - - -  Follow up - - - - Falls prevention discussed;Education provided  Comment - - - - -     Depression Screen PHQ 2/9 Scores 07/04/2018 07/04/2018 04/26/2017 06/07/2016  PHQ - 2 Score 0 0 0 0  PHQ- 9 Score 2 - - -  Exception Documentation - - - -    No flowsheet data found.     Assessment & Plan:    Annual Physical Reviewed patient's Family Medical History Reviewed and updated list of patient's medical providers Assessment of cognitive impairment was done Assessed patient's functional ability Established a written schedule for health screening [place] Completed and Reviewed  Exercise Activities and Dietary recommendations Goals    . DIET - INCREASE WATER INTAKE     Recommend to start drinking 3 glasses of water a day.     . Quit Smoking     Recommend to continue efforts to reduce smoking habits until no longer smoking (Smoking Cessation literature attached to AVS).         Immunization History  Administered Date(s) Administered  . Influenza, High Dose Seasonal PF 10/12/2016, 11/03/2017, 09/18/2018  . Influenza-Unspecified 12/01/2014  . Pneumococcal Conjugate-13 05/08/2013  . Pneumococcal Polysaccharide-23 10/02/2010    Health Maintenance  Topic Date Due  .  Samul Dada  09/08/1965  . PNA vac Low Risk Adult (2 of 2 - PPSV23) 10/02/2015  . Fecal DNA (Cologuard)  05/16/2020  . INFLUENZA VACCINE  Completed  . Hepatitis C Screening  Completed     Discussed health benefits of physical activity, and encouraged him to engage in regular exercise appropriate for his age and condition.    ------------------------------------------------------------------------------------------------------------  1. Annual physical exam   2. Prostate cancer screening  - PSA  3. Essential hypertension Well controlled.  Continue current medications.    4. Hypercholesteremia He is tolerating rosuvastatin well with no adverse effects.   - Comprehensive metabolic panel - Lipid panel  5. Insomnia, unspecified type Continue prn trazodone  6. Need for influenza vaccination  - FLUZONE HIGH-DOSE QUADRIVALENT 0.7 ML SUSY; ADM 0.7ML IM UTD    [name], MD  [facility]

## 2018-09-27 NOTE — Patient Instructions (Addendum)
.   Please review the attached list of medications and notify my office if there are any errors.   . Please bring all of your medications to every appointment so we can make sure that our medication list is the same as yours.    The CDC recommends two doses of Shingrix (the shingles vaccine) separated by 2 to 6 months for adults age 72 years and older. I recommend checking with your insurance plan regarding coverage for this vaccine.   

## 2018-09-28 ENCOUNTER — Telehealth: Payer: Self-pay

## 2018-09-28 LAB — COMPREHENSIVE METABOLIC PANEL
ALT: 7 IU/L (ref 0–44)
AST: 16 IU/L (ref 0–40)
Albumin/Globulin Ratio: 1.9 (ref 1.2–2.2)
Albumin: 4.4 g/dL (ref 3.7–4.7)
Alkaline Phosphatase: 61 IU/L (ref 39–117)
BUN/Creatinine Ratio: 12 (ref 10–24)
BUN: 19 mg/dL (ref 8–27)
Bilirubin Total: 0.5 mg/dL (ref 0.0–1.2)
CO2: 26 mmol/L (ref 20–29)
Calcium: 9.8 mg/dL (ref 8.6–10.2)
Chloride: 96 mmol/L (ref 96–106)
Creatinine, Ser: 1.65 mg/dL — ABNORMAL HIGH (ref 0.76–1.27)
GFR calc Af Amer: 47 mL/min/{1.73_m2} — ABNORMAL LOW (ref >59)
GFR calc non Af Amer: 41 mL/min/{1.73_m2} — ABNORMAL LOW (ref >59)
Globulin, Total: 2.3 g/dL (ref 1.5–4.5)
Glucose: 115 mg/dL — ABNORMAL HIGH (ref 65–99)
Potassium: 3.3 mmol/L — ABNORMAL LOW (ref 3.5–5.2)
Sodium: 140 mmol/L (ref 134–144)
Total Protein: 6.7 g/dL (ref 6.0–8.5)

## 2018-09-28 LAB — LIPID PANEL
Chol/HDL Ratio: 2.9 ratio (ref 0.0–5.0)
Cholesterol, Total: 126 mg/dL (ref 100–199)
HDL: 44 mg/dL (ref >39)
LDL Calculated: 63 mg/dL (ref 0–99)
Triglycerides: 96 mg/dL (ref 0–149)
VLDL Cholesterol Cal: 19 mg/dL (ref 5–40)

## 2018-09-28 LAB — PSA: Prostate Specific Ag, Serum: <0.1 ng/mL (ref 0.0–4.0)

## 2018-09-28 NOTE — Telephone Encounter (Signed)
LMTCB 09/28/2018  Thanks,   -Mickel Baas

## 2018-09-28 NOTE — Telephone Encounter (Signed)
-----   Message from [name], MD sent at 09/28/2018  9:07 AM EDT ----- Kidney functions have dropped quite a bit. Need to avoid any OTC NSAIDs such as ibuprofen and Aleve. Drink more water. Cholesterol and PSA are normal. Need to recheck renal panel in a month to make sure it is stable. Will call him at the end of September to schedule.

## 2018-09-29 MED ORDER — LUBIPROSTONE 24 MCG PO CAPS: 24.0000 ug | ORAL_CAPSULE | Freq: Two times a day (BID) | ORAL | 4 refills | Status: DC

## 2018-09-29 NOTE — Telephone Encounter (Signed)
Pt advised.  Pt stated he wanted a medicine called in for constipation.  He reports he is taking Miralax daily as well as Ex-Lax.  He states he is only able to have a bowel movement twice a week.  Pt uses [facility] in [place],    Thanks,   -Mickel Baas

## 2018-10-18 ENCOUNTER — Other Ambulatory Visit: Payer: Self-pay | Admitting: Family Medicine

## 2018-10-18 DIAGNOSIS — M5442 Lumbago with sciatica, left side: Secondary | ICD-10-CM

## 2018-10-18 DIAGNOSIS — G8929 Other chronic pain: Secondary | ICD-10-CM

## 2018-10-18 NOTE — Telephone Encounter (Signed)
Pt needs a refill on his   Oxycodone 15 mg  [facility] [place]  teri

## 2018-10-19 MED ORDER — OXYCODONE HCL 15 MG PO TABS: 15.0000 mg | ORAL_TABLET | Freq: Four times a day (QID) | ORAL | 0 refills | Status: DC | PRN

## 2018-11-07 ENCOUNTER — Telehealth: Payer: Self-pay | Admitting: Family Medicine

## 2018-11-07 DIAGNOSIS — N1832 Chronic kidney disease, stage 3b: Secondary | ICD-10-CM

## 2018-11-07 NOTE — Telephone Encounter (Signed)
-----   Message from [name], MD sent at 09/28/2018  9:06 AM EDT ----- Regarding: recheck renal panel  at end of september

## 2018-11-07 NOTE — Telephone Encounter (Signed)
Please advise is time to recheck kidney functions.

## 2018-11-07 NOTE — Telephone Encounter (Signed)
Tried calling patient.Unable to reach patient on any numbers listed in his chart. I called his wife and  left message for her to call us back.

## 2018-11-13 NOTE — Telephone Encounter (Signed)
Tried calling patient. Left message to call back. 

## 2018-11-17 ENCOUNTER — Other Ambulatory Visit: Payer: Self-pay

## 2018-11-17 DIAGNOSIS — M5441 Lumbago with sciatica, right side: Secondary | ICD-10-CM

## 2018-11-17 DIAGNOSIS — G8929 Other chronic pain: Secondary | ICD-10-CM

## 2018-11-17 MED ORDER — OXYCODONE HCL 15 MG PO TABS: 15.0000 mg | ORAL_TABLET | Freq: Four times a day (QID) | ORAL | 0 refills | Status: DC | PRN

## 2018-11-20 NOTE — Telephone Encounter (Signed)
Patient has been advised and lab slip has been left upfront for pick up.KW

## 2018-11-20 NOTE — Telephone Encounter (Signed)
Patient advised. He states he has an appointment to come in the office on 11/28/2018 at 1:40pm. Patient wants to have labs done after this appointment.

## 2018-11-20 NOTE — Telephone Encounter (Signed)
That's fine, please print order and leave at lab.

## 2018-11-22 ENCOUNTER — Telehealth: Payer: Self-pay | Admitting: Family Medicine

## 2018-11-22 NOTE — Chronic Care Management (AMB) (Signed)
°  Chronic Care Management   Outreach Note  11/22/2018 Name: Justin Harrington MRN: [id number] DOB: March 06, 1945  Referred by: [name], MD Reason for referral : Chronic Care Management (Initial CCM outreach was unsuccessful. )   An unsuccessful telephone outreach was attempted today. The patient was referred to the case management team by for assistance with care management and care coordination.   Follow Up Plan: The care management team will reach out to the patient again over the next 7 days.   [place]  [email or web address]   ??[phone]

## 2018-11-27 ENCOUNTER — Other Ambulatory Visit (INDEPENDENT_AMBULATORY_CARE_PROVIDER_SITE_OTHER): Payer: Self-pay | Admitting: Vascular Surgery

## 2018-11-27 ENCOUNTER — Other Ambulatory Visit: Payer: Self-pay | Admitting: Family Medicine

## 2018-11-27 NOTE — Telephone Encounter (Signed)
Patient had Endovascular repair/stent graft on 03/29/2018 and cancelled last appointment for 06/13/2018.

## 2018-11-28 ENCOUNTER — Ambulatory Visit: Payer: PPO | Admitting: Family Medicine

## 2018-11-28 DIAGNOSIS — N1832 Chronic kidney disease, stage 3b: Secondary | ICD-10-CM | POA: Diagnosis not present

## 2018-11-28 NOTE — Progress Notes (Deleted)
Patient: Justin Harrington Male    DOB: 27-Dec-1945   73 y.o.   MRN: [id number] Visit Date: 11/28/2018  Today's Provider: [name], MD   No chief complaint on file.  Subjective:     HPI Decreased Kidney Function: Patient presents for a 2 month follow up. Last labs done on 09/27/2018 showed decreased kidney functions. Patient advised to avoid any OTC NSAIDs such as ibuprofen and Aleve and drink more water. Patient reports good compliance with treatment plan.   BMP Latest Ref Rng & Units 09/27/2018 03/30/2018 03/27/2018  Glucose 65 - 99 mg/dL 115(H) 103(H) 107(H)  BUN 8 - 27 mg/dL 19 15 19   Creatinine 0.76 - 1.27 mg/dL 1.65(H) 0.97 1.27(H)  BUN/Creat Ratio 10 - 24 12 - -  Sodium 134 - 144 mmol/L 140 140 142  Potassium 3.5 - 5.2 mmol/L 3.3(L) 3.4(L) 3.2(L)  Chloride 96 - 106 mmol/L 96 104 104  CO2 20 - 29 mmol/L 26 27 32  Calcium 8.6 - 10.2 mg/dL 9.8 8.3(L) 9.0     Allergies  Allergen Reactions  . Zolpidem     Other reaction(s): Other (See Comments) GOT UP IN MIDDLE OF NIGHT AND FILLED ALL THE GLASSES WITH FLOUR     Current Outpatient Medications:  .  acetaminophen (TYLENOL) 325 MG tablet, Take 1-2 tablets (325-650 mg total) by mouth every 6 (six) hours as needed for mild pain (or temp >/= 101 F)., Disp: , Rfl:  .  aspirin EC 81 MG tablet, Take 81 mg by mouth every evening. Alternates EOD with aspirin., Disp: , Rfl:  .  atorvastatin (LIPITOR) 80 MG tablet, TAKE 1 TABLET(80 MG) BY MOUTH DAILY, Disp: 90 tablet, Rfl: 4 .  clopidogrel (PLAVIX) 75 MG tablet, Take 1 tablet (75 mg total) by mouth daily., Disp: 90 tablet, Rfl: 0 .  doxylamine, Sleep, (UNISOM) 25 MG tablet, Take 25 mg by mouth at bedtime as needed for sleep. , Disp: , Rfl:  .  FLUZONE HIGH-DOSE QUADRIVALENT 0.7 ML SUSY, ADM 0.7ML IM UTD, Disp: , Rfl:  .  hydrochlorothiazide (HYDRODIURIL) 25 MG tablet, Take 1 tablet (25 mg total) by mouth daily., Disp: 90 tablet, Rfl: 4 .  lubiprostone (AMITIZA) 24 MCG  capsule, Take 1 capsule (24 mcg total) by mouth 2 (two) times daily with a meal., Disp: 60 capsule, Rfl: 4 .  oxyCODONE (ROXICODONE) 15 MG immediate release tablet, Take 1 tablet (15 mg total) by mouth 4 (four) times daily as needed for pain., Disp: 120 tablet, Rfl: 0 .  Sennosides (EX-LAX PO), Take 3-4 tablets by mouth every 3 (three) days as needed (constipation). , Disp: , Rfl:  .  terbinafine (LAMISIL) 250 MG tablet, Take 2 tablets (500 mg total) by mouth daily. Take for 1 week out of every 4 weeks, Disp: 14 tablet, Rfl: 11 .  traZODone (DESYREL) 150 MG tablet, TAKE 1 TABLET(150 MG) BY MOUTH AT BEDTIME AS NEEDED, Disp: 90 tablet, Rfl: 4  Review of Systems  Constitutional: Negative.   Respiratory: Negative.   Cardiovascular: Negative.   Musculoskeletal: Negative.     Social History   Tobacco Use  . Smoking status: Current Every Day Smoker    Packs/day: 0.75    Years: 50.00    Pack years: 37.50    Types: Cigarettes  . Smokeless tobacco: Never Used  Substance Use Topics  . Alcohol use: No    Alcohol/week: 0.0 standard drinks      Objective:   There were  no vitals taken for this visit. There were no vitals filed for this visit.There is no height or weight on file to calculate BMI.   Physical Exam   No results found for any visits on 11/28/18.     Assessment & Plan        [name], MD  [facility]

## 2018-11-29 ENCOUNTER — Telehealth: Payer: Self-pay

## 2018-11-29 LAB — RENAL FUNCTION PANEL
Albumin: 4.5 g/dL (ref 3.7–4.7)
BUN/Creatinine Ratio: 14 (ref 10–24)
BUN: 18 mg/dL (ref 8–27)
CO2: 26 mmol/L (ref 20–29)
Calcium: 9.9 mg/dL (ref 8.6–10.2)
Chloride: 101 mmol/L (ref 96–106)
Creatinine, Ser: 1.25 mg/dL (ref 0.76–1.27)
GFR calc Af Amer: 66 mL/min/{1.73_m2} (ref >59)
GFR calc non Af Amer: 57 mL/min/{1.73_m2} — ABNORMAL LOW (ref >59)
Glucose: 122 mg/dL — ABNORMAL HIGH (ref 65–99)
Phosphorus: 3.1 mg/dL (ref 2.8–4.1)
Potassium: 3.6 mmol/L (ref 3.5–5.2)
Sodium: 141 mmol/L (ref 134–144)

## 2018-11-29 NOTE — Telephone Encounter (Signed)
-----   Message from [name], MD sent at 11/29/2018  7:51 AM EDT ----- Kidney functions much better. Continue current medications.  Check yearly with CPE

## 2018-11-29 NOTE — Telephone Encounter (Signed)
No answer; unable to LM.

## 2018-11-29 NOTE — Chronic Care Management (AMB) (Signed)
Chronic Care Management   Note  11/29/2018 Name: Marcella Dunnaway III MRN: [id number] DOB: 02-18-1945  Tillman Abide III is a 73 y.o. year old male who is a primary care patient of Fisher, [name], MD. I reached out to Tillman Abide III by phone today in response to a referral sent by Mr. Kwamane Whack III's health plan.     Mr. Winship was given information about Chronic Care Management services today including:  1. CCM service includes personalized support from designated clinical staff supervised by his physician, including individualized plan of care and coordination with other care providers 2. 24/7 contact phone numbers for assistance for urgent and routine care needs. 3. Service will only be billed when office clinical staff spend 20 minutes or more in a month to coordinate care. 4. Only one practitioner may furnish and bill the service in a calendar month. 5. The patient may stop CCM services at any time (effective at the end of the month) by phone call to the office staff. 6. The patient will be responsible for cost sharing (co-pay) of up to 20% of the service fee (after annual deductible is met).  Patient agreed to services and verbal consent obtained.   Follow up plan: Telephone appointment with CCM team member scheduled for: 09/27/2018  [place]  [email or web address]   ??[phone]

## 2018-12-01 NOTE — Telephone Encounter (Signed)
Attempted to contact patient, no answer or voicemail. 

## 2018-12-04 NOTE — Telephone Encounter (Signed)
Attempted to contact patient on home and cell, no answer left a voicemail on home number. Letter mailed to address in chart.

## 2018-12-08 DIAGNOSIS — H2511 Age-related nuclear cataract, right eye: Secondary | ICD-10-CM | POA: Diagnosis not present

## 2018-12-18 ENCOUNTER — Other Ambulatory Visit: Payer: Self-pay | Admitting: Family Medicine

## 2018-12-18 DIAGNOSIS — M5442 Lumbago with sciatica, left side: Secondary | ICD-10-CM

## 2018-12-18 DIAGNOSIS — G8929 Other chronic pain: Secondary | ICD-10-CM

## 2018-12-18 MED ORDER — OXYCODONE HCL 15 MG PO TABS: 15.0000 mg | ORAL_TABLET | Freq: Four times a day (QID) | ORAL | 0 refills | Status: DC | PRN

## 2018-12-18 NOTE — Telephone Encounter (Signed)
Requested medication (s) are due for refill today: yes  Requested medication (s) are on the active medication list: yes  Last refill:  11/17/2018  Future visit scheduled: no  Notes to clinic:  Refill cannot be delegated Patient requesting 120 pills   Requested Prescriptions  Pending Prescriptions Disp Refills   oxyCODONE (ROXICODONE) 15 MG immediate release tablet 120 tablet 0    Sig: Take 1 tablet (15 mg total) by mouth 4 (four) times daily as needed for pain.     Not Delegated - Analgesics:  Opioid Agonists Failed - 12/18/2018  9:51 AM      Failed - This refill cannot be delegated      Failed - Urine Drug Screen completed in last 360 days.      Passed - Valid encounter within last 6 months    Recent Outpatient Visits          2 months ago Annual physical exam   [facility] [name], MD   7 months ago Onychomycosis   [facility] [name], MD   8 months ago Painful urination   [place], [place]   1 year ago Essential hypertension   [facility] [name], MD   2 years ago Chronic low back pain (Location of Primary Source of Pain) (Bilateral) (R>L)   [facility] [name], MD

## 2018-12-18 NOTE — Telephone Encounter (Signed)
Medication Refill - Medication: oxyCODONE (ROXICODONE) 15 MG immediate release tablet  Has the patient contacted their pharmacy? No - states that he has to call in and request.  States he needs 120 pills. (Agent: If no, request that the patient contact the pharmacy for the refill.) (Agent: If yes, when and what did the pharmacy advise?)  Preferred Pharmacy (with phone number or street name):  [id number] - [place], [address] AT [address] [phone] (Phone) [phone] (Fax)   Agent: Please be advised that RX refills may take up to 3 business days. We ask that you follow-up with your pharmacy.

## 2019-01-02 ENCOUNTER — Telehealth: Payer: PPO

## 2019-01-16 ENCOUNTER — Telehealth: Payer: Self-pay

## 2019-01-16 ENCOUNTER — Other Ambulatory Visit: Payer: Self-pay | Admitting: Family Medicine

## 2019-01-16 DIAGNOSIS — G8929 Other chronic pain: Secondary | ICD-10-CM

## 2019-01-16 MED ORDER — OXYCODONE HCL 15 MG PO TABS: 15.0000 mg | ORAL_TABLET | Freq: Four times a day (QID) | ORAL | 0 refills | Status: DC | PRN

## 2019-01-16 NOTE — Telephone Encounter (Signed)
Requested medication (s) are due for refill today:yes  Requested medication (s) are on the active medication list: yes  Last refill:12/18/2018  #120   0 refills  Future visit scheduled Yes 02/06/19  Notes to clinic:not delegated  Requested Prescriptions  Pending Prescriptions Disp Refills   oxyCODONE (ROXICODONE) 15 MG immediate release tablet 120 tablet 0    Sig: Take 1 tablet (15 mg total) by mouth 4 (four) times daily as needed for pain.      Not Delegated - Analgesics:  Opioid Agonists Failed - 01/16/2019 11:24 AM      Failed - This refill cannot be delegated      Failed - Urine Drug Screen completed in last 360 days.      Passed - Valid encounter within last 6 months    Recent Outpatient Visits           3 months ago Annual physical exam   [facility] [name], MD   7 months ago Onychomycosis   [facility] [name], MD   9 months ago Painful urination   [place], [place], [place]   1 year ago Essential hypertension   [facility] [name], MD   2 years ago Chronic low back pain (Location of Primary Source of Pain) (Bilateral) (R>L)   [facility] [name], MD

## 2019-01-16 NOTE — Telephone Encounter (Signed)
Medication Refill - Medication: oxyCODONE (ROXICODONE) 15 MG immediate release tablet  Has the patient contacted their pharmacy? No - has to call PCP monthly for refill (Agent: If no, request that the patient contact the pharmacy for the refill.) (Agent: If yes, when and what did the pharmacy advise?)  Preferred Pharmacy (with phone number or street name):  [facility] DRUG STORE [id number] [facility], [place] [address] AT [place] Phone:  [phone]  Fax:  [phone]     Agent: Please be advised that RX refills may take up to 3 business days. We ask that you follow-up with your pharmacy.

## 2019-02-06 ENCOUNTER — Telehealth: Payer: Self-pay

## 2019-02-06 ENCOUNTER — Ambulatory Visit: Payer: Self-pay

## 2019-02-06 NOTE — Chronic Care Management (AMB) (Signed)
  Chronic Care Management   Outreach Note  02/06/2019 Name: Justin Harrington MRN: [id number] DOB: 1945/06/24  Primary Care Provider: [name], MD Reason for referral : Chronic Care Management    An unsuccessful telephone outreach was attempted today. Mr. Scherr was referred to the case management team for assistance with care management and care coordination.     Follow Up Plan: The care management team will reach out to Mr. Leyton again within the next two weeks.    Horris Latino [facility] Care Management [phone]

## 2019-02-15 ENCOUNTER — Other Ambulatory Visit: Payer: Self-pay | Admitting: Family Medicine

## 2019-02-15 DIAGNOSIS — G8929 Other chronic pain: Secondary | ICD-10-CM

## 2019-02-15 DIAGNOSIS — M5442 Lumbago with sciatica, left side: Secondary | ICD-10-CM

## 2019-02-15 MED ORDER — OXYCODONE HCL 15 MG PO TABS: 15.0000 mg | ORAL_TABLET | Freq: Four times a day (QID) | ORAL | 0 refills | Status: DC | PRN

## 2019-02-15 NOTE — Telephone Encounter (Signed)
Pt needs a refill on oxycodone #120. [facility] [place]

## 2019-02-20 ENCOUNTER — Telehealth: Payer: Self-pay

## 2019-02-20 ENCOUNTER — Ambulatory Visit: Payer: Self-pay

## 2019-02-20 NOTE — Chronic Care Management (AMB) (Signed)
  Chronic Care Management   Outreach Note  02/20/2019 Name: Justin Harrington MRN: [id number] DOB: 09/12/1945  Primary Care Provider: [name], MD Reason for referral : Chronic Care Management   A second unsuccessful telephone outreach was attempted today. Justin Harrington was referred to the case management team for assistance with care management and care coordination.     Follow Up Plan: The care management team will reach out to Justin Harrington within the next two weeks.    Horris Latino [facility] Care Management [phone]

## 2019-02-23 ENCOUNTER — Other Ambulatory Visit (INDEPENDENT_AMBULATORY_CARE_PROVIDER_SITE_OTHER): Payer: Self-pay | Admitting: Nurse Practitioner

## 2019-03-06 ENCOUNTER — Telehealth: Payer: Self-pay

## 2019-03-07 ENCOUNTER — Ambulatory Visit: Payer: Self-pay

## 2019-03-07 NOTE — Chronic Care Management (AMB) (Signed)
  Chronic Care Management   Outreach Note  03/07/2019 Name: Justin Harrington MRN: [id number] DOB: May 10, 1945  Primary Care Provider: [name], MD Reason for referral : Chronic Care Management   [name] was referred to the care management team for assistance with chronic care management and care coordination. Multiple  outreach attempts have been made. His primary care provider will be notified of our unsuccessful attempts to establish and maintain contact. The care management team will gladly outreach at any time in the future if he is interested in receiving assistance.  PLAN  The care management team will gladly follow up with Mr. Lohse after the primary care provider has a conversation with him regarding recommendation for care management engagement and subsequent re-referral for care management services.   Horris Latino [facility] Care Management [phone]

## 2019-03-19 ENCOUNTER — Other Ambulatory Visit: Payer: Self-pay | Admitting: Family Medicine

## 2019-03-19 DIAGNOSIS — M5442 Lumbago with sciatica, left side: Secondary | ICD-10-CM

## 2019-03-19 DIAGNOSIS — G8929 Other chronic pain: Secondary | ICD-10-CM

## 2019-03-19 MED ORDER — OXYCODONE HCL 15 MG PO TABS: 15.0000 mg | ORAL_TABLET | Freq: Four times a day (QID) | ORAL | 0 refills | Status: DC | PRN

## 2019-03-19 NOTE — Telephone Encounter (Signed)
Medication Refill - Medication: Oxycodone 15mg   Has the patient contacted their pharmacy? No. (Agent: If no, request that the patient contact the pharmacy for the refill.) (Agent: If yes, when and what did the pharmacy advise?)  Preferred Pharmacy (with phone number or street name): [facility] [place]  Agent: Please be advised that RX refills may take up to 3 business days. We ask that you follow-up with your pharmacy.

## 2019-04-16 ENCOUNTER — Other Ambulatory Visit: Payer: Self-pay | Admitting: Family Medicine

## 2019-04-16 DIAGNOSIS — M5441 Lumbago with sciatica, right side: Secondary | ICD-10-CM

## 2019-04-16 DIAGNOSIS — G8929 Other chronic pain: Secondary | ICD-10-CM

## 2019-04-16 MED ORDER — OXYCODONE HCL 15 MG PO TABS: 15.0000 mg | ORAL_TABLET | Freq: Four times a day (QID) | ORAL | 0 refills | Status: DC | PRN

## 2019-04-16 NOTE — Telephone Encounter (Signed)
Medication Refill - Medication:  oxyCODONE (ROXICODONE) 15 MG immediate release tablet  Has the patient contacted their pharmacy? No. (Agent: If no, request that the patient contact the pharmacy for the refill.) (Agent: If yes, when and what did the pharmacy advise?)  Preferred Pharmacy (with phone number or street name): [facility] [place]   Agent: Please be advised that RX refills may take up to 3 business days. We ask that you follow-up with your pharmacy.

## 2019-04-16 NOTE — Telephone Encounter (Signed)
LOV 09/27/18  LRF 03/19/19  #120

## 2019-05-17 ENCOUNTER — Other Ambulatory Visit: Payer: Self-pay | Admitting: Family Medicine

## 2019-05-17 DIAGNOSIS — G8929 Other chronic pain: Secondary | ICD-10-CM

## 2019-05-17 MED ORDER — OXYCODONE HCL 15 MG PO TABS: 15.0000 mg | ORAL_TABLET | Freq: Four times a day (QID) | ORAL | 0 refills | Status: DC | PRN

## 2019-05-17 NOTE — Telephone Encounter (Signed)
Requested medication (s) are due for refill today: yes  Requested medication (s) are on the active medication list: yes  Last refill:  04/16/19  Future visit scheduled: no  Notes to clinic:  not delegated; no valid encounter within last 6 months    Requested Prescriptions  Pending Prescriptions Disp Refills   oxyCODONE (ROXICODONE) 15 MG immediate release tablet 120 tablet 0    Sig: Take 1 tablet (15 mg total) by mouth 4 (four) times daily as needed for pain.      Not Delegated - Analgesics:  Opioid Agonists Failed - 05/17/2019  9:47 AM      Failed - This refill cannot be delegated      Failed - Urine Drug Screen completed in last 360 days.      Failed - Valid encounter within last 6 months    Recent Outpatient Visits           7 months ago Annual physical exam   [facility] [name], MD   12 months ago Onychomycosis   [facility] [name], MD   1 year ago Painful urination   [facility] [place], [place]   2 years ago Essential hypertension   [facility] [name], MD   2 years ago Chronic low back pain (Location of Primary Source of Pain) (Bilateral) (R>L)   [facility] [name], MD

## 2019-05-17 NOTE — Telephone Encounter (Signed)
Pt called again checking on his refill.  He said he had one pill left.  He would lik eot know if he will get this refill today.  CB#  [id number]

## 2019-05-17 NOTE — Telephone Encounter (Signed)
oxyCODONE (ROXICODONE) 15 MG immediate release tablet     Patient requesting refill.    Pharmacy:  [facility] DR[facility] STORE [place], [place] [address] AT [place] Phone:  [phone]  Fax:  [phone]

## 2019-05-31 ENCOUNTER — Other Ambulatory Visit (INDEPENDENT_AMBULATORY_CARE_PROVIDER_SITE_OTHER): Payer: Self-pay | Admitting: Nurse Practitioner

## 2019-06-01 NOTE — Telephone Encounter (Signed)
Is it ok to refill this med he was seen a year ago but this med was refilled by you 3 months ago.

## 2019-06-01 NOTE — Telephone Encounter (Signed)
We can refill but he needs to be seen with an EVAR before we can do any refills after this

## 2019-06-11 ENCOUNTER — Other Ambulatory Visit: Payer: Self-pay | Admitting: Family Medicine

## 2019-06-11 DIAGNOSIS — M5442 Lumbago with sciatica, left side: Secondary | ICD-10-CM

## 2019-06-11 DIAGNOSIS — G8929 Other chronic pain: Secondary | ICD-10-CM

## 2019-06-11 NOTE — Addendum Note (Signed)
Addended by: Valli Glance F on: 06/11/2019 01:52 PM   Modules accepted: Orders

## 2019-06-11 NOTE — Telephone Encounter (Signed)
Medication Refill - Medication: oxycodone   Has the patient contacted their pharmacy? Yes.   Pt called and is requesting to have his medication early since he will be going out of town. Please advise.  (Agent: If no, request that the patient contact the pharmacy for the refill.) (Agent: If yes, when and what did the pharmacy advise?)  Preferred Pharmacy (with phone number or street name):  [place] [id number] [facility] [place] [address]  [place] [address]  Phone: [phone] Fax: [phone]  Not a 24 hour pharmacy; exact hours not known.    Agent: Please be advised that RX refills may take up to 3 business days. We ask that you follow-up with your pharmacy.

## 2019-06-11 NOTE — Telephone Encounter (Signed)
Requested medication (s) are due for refill today - patient requesting early RF- going out of town  Requested medication (s) are on the active medication list -yes  Future visit scheduled -no  Last refill: 05/17/19  Notes to clinic: Request for non delegated Rx  Requested Prescriptions  Pending Prescriptions Disp Refills   oxyCODONE (ROXICODONE) 15 MG immediate release tablet 120 tablet 0    Sig: Take 1 tablet (15 mg total) by mouth 4 (four) times daily as needed for pain.      Not Delegated - Analgesics:  Opioid Agonists Failed - 06/11/2019  1:52 PM      Failed - This refill cannot be delegated      Failed - Urine Drug Screen completed in last 360 days.      Failed - Valid encounter within last 6 months    Recent Outpatient Visits           8 months ago Annual physical exam   [facility] [name], MD   1 year ago Onychomycosis   [facility] [name], MD   1 year ago Painful urination   [facility] [place], [place]   2 years ago Essential hypertension   [facility] [name], MD   2 years ago Chronic low back pain (Location of Primary Source of Pain) (Bilateral) (R>L)   [facility] [name], MD                  Requested Prescriptions  Pending Prescriptions Disp Refills   oxyCODONE (ROXICODONE) 15 MG immediate release tablet 120 tablet 0    Sig: Take 1 tablet (15 mg total) by mouth 4 (four) times daily as needed for pain.      Not Delegated - Analgesics:  Opioid Agonists Failed - 06/11/2019  1:52 PM      Failed - This refill cannot be delegated      Failed - Urine Drug Screen completed in last 360 days.      Failed - Valid encounter within last 6 months    Recent Outpatient Visits           8 months ago Annual physical exam   [facility] [name], MD   1 year ago Onychomycosis   [facility] [name], MD   1 year ago Painful urination   [facility] [place], [place]   2 years ago Essential hypertension   [facility] [name], MD   2 years ago Chronic low back pain (Location of Primary Source of Pain) (Bilateral) (R>L)   [facility] [name], MD

## 2019-06-12 MED ORDER — OXYCODONE HCL 15 MG PO TABS: 15.0000 mg | ORAL_TABLET | Freq: Four times a day (QID) | ORAL | 0 refills | Status: DC | PRN

## 2019-07-11 ENCOUNTER — Other Ambulatory Visit: Payer: Self-pay | Admitting: Family Medicine

## 2019-07-11 DIAGNOSIS — M5441 Lumbago with sciatica, right side: Secondary | ICD-10-CM

## 2019-07-11 NOTE — Telephone Encounter (Signed)
Requested medication (s) are due for refill today -yes  Requested medication (s) are on the active medication list -yes  Future visit scheduled -no  Last refill: 06/12/19  Notes to clinic: Request for RF of non delegated Rx  Requested Prescriptions  Pending Prescriptions Disp Refills   oxyCODONE (ROXICODONE) 15 MG immediate release tablet 120 tablet 0    Sig: Take 1 tablet (15 mg total) by mouth 4 (four) times daily as needed for pain.      Not Delegated - Analgesics:  Opioid Agonists Failed - 07/11/2019  2:28 PM      Failed - This refill cannot be delegated      Failed - Urine Drug Screen completed in last 360 days.      Failed - Valid encounter within last 6 months    Recent Outpatient Visits           9 months ago Annual physical exam   [facility] [name], MD   1 year ago Onychomycosis   [facility] [name], MD   1 year ago Painful urination   [facility] Fenton Malling M, [place]   2 years ago Essential hypertension   [facility] [name], MD   2 years ago Chronic low back pain (Location of Primary Source of Pain) (Bilateral) (R>L)   [facility] [name], MD                  Requested Prescriptions  Pending Prescriptions Disp Refills   oxyCODONE (ROXICODONE) 15 MG immediate release tablet 120 tablet 0    Sig: Take 1 tablet (15 mg total) by mouth 4 (four) times daily as needed for pain.      Not Delegated - Analgesics:  Opioid Agonists Failed - 07/11/2019  2:28 PM      Failed - This refill cannot be delegated      Failed - Urine Drug Screen completed in last 360 days.      Failed - Valid encounter within last 6 months    Recent Outpatient Visits           9 months ago Annual physical exam   [facility] [name], MD   1 year ago Onychomycosis   [facility] [name], MD   1 year ago Painful urination   [facility] [place], [place]   2 years ago Essential hypertension   [facility] [name], MD   2 years ago Chronic low back pain (Location of Primary Source of Pain) (Bilateral) (R>L)   [facility] [name], MD

## 2019-07-11 NOTE — Telephone Encounter (Signed)
PT needs a refill  oxyCODONE (ROXICODONE) 15 MG immediate release tablet [[id number]]  [facility] DRUG STORE [place], [place] [address] AT [place]  [place] [address]  Phone: [phone] Fax: [phone]

## 2019-07-12 MED ORDER — OXYCODONE HCL 15 MG PO TABS: 15.0000 mg | ORAL_TABLET | Freq: Four times a day (QID) | ORAL | 0 refills | Status: DC | PRN

## 2019-08-09 ENCOUNTER — Telehealth: Payer: Self-pay

## 2019-08-09 DIAGNOSIS — Z87891 Personal history of nicotine dependence: Secondary | ICD-10-CM

## 2019-08-09 DIAGNOSIS — Z122 Encounter for screening for malignant neoplasm of respiratory organs: Secondary | ICD-10-CM

## 2019-08-09 NOTE — Telephone Encounter (Signed)
Patient has been notified that the low dose lung cancer screening CT scan is due currently or will be in near future.  Confirmed that patient is within the appropriate age range and asymptomatic, (no signs or symptoms of lung cancer).  Patient denies illness that would prevent curative treatment for lung cancer if found.  Patient is agreeable for CT scan being scheduled.    Verified smoking history (current smoker, with 51 year 0.75 ppd history).   CT scheduled for 08/20/19 @ 1:30

## 2019-08-10 ENCOUNTER — Other Ambulatory Visit: Payer: Self-pay | Admitting: Family Medicine

## 2019-08-10 DIAGNOSIS — G8929 Other chronic pain: Secondary | ICD-10-CM

## 2019-08-10 NOTE — Addendum Note (Signed)
Addended by: Lieutenant Diego on: 08/10/2019 09:49 AM   Modules accepted: Orders

## 2019-08-10 NOTE — Telephone Encounter (Signed)
Requested medication (s) are due for refill today -yes  Requested medication (s) are on the active medication list -yes  Future visit scheduled -no  Last refill: 07/12/19  Notes to clinic: Request for non delegated Rx  Requested Prescriptions  Pending Prescriptions Disp Refills   oxyCODONE (ROXICODONE) 15 MG immediate release tablet 120 tablet 0    Sig: Take 1 tablet (15 mg total) by mouth 4 (four) times daily as needed for pain.      Not Delegated - Analgesics:  Opioid Agonists Failed - 08/10/2019 11:07 AM      Failed - This refill cannot be delegated      Failed - Urine Drug Screen completed in last 360 days.      Failed - Valid encounter within last 6 months    Recent Outpatient Visits           10 months ago Annual physical exam   [facility] [name], MD   1 year ago Onychomycosis   [facility] [name], MD   1 year ago Painful urination   [facility] F[facility] [place] M, [place]   2 years ago Essential hypertension   [facility] [name], MD   2 years ago Chronic low back pain (Location of Primary Source of Pain) (Bilateral) (R>L)   [facility] [name], MD                  Requested Prescriptions  Pending Prescriptions Disp Refills   oxyCODONE (ROXICODONE) 15 MG immediate release tablet 120 tablet 0    Sig: Take 1 tablet (15 mg total) by mouth 4 (four) times daily as needed for pain.      Not Delegated - Analgesics:  Opioid Agonists Failed - 08/10/2019 11:07 AM      Failed - This refill cannot be delegated      Failed - Urine Drug Screen completed in last 360 days.      Failed - Valid encounter within last 6 months    Recent Outpatient Visits           10 months ago Annual physical exam   [facility] [name], MD   1 year ago Onychomycosis   [facility] [name], MD   1 year ago Painful urination    [facility] [place], [place]   2 years ago Essential hypertension   [facility] [name], MD   2 years ago Chronic low back pain (Location of Primary Source of Pain) (Bilateral) (R>L)   [facility] [name], MD

## 2019-08-10 NOTE — Telephone Encounter (Signed)
Smoking history: current, 39.75 pack year

## 2019-08-10 NOTE — Telephone Encounter (Signed)
Patient requesting oxyCODONE (ROXICODONE) 15 MG immediate release tablet , informed please allow 48 to 72 hour turn around time

## 2019-08-13 MED ORDER — OXYCODONE HCL 15 MG PO TABS: 15.0000 mg | ORAL_TABLET | Freq: Four times a day (QID) | ORAL | 0 refills | Status: DC | PRN

## 2019-08-20 ENCOUNTER — Ambulatory Visit
Admission: RE | Admit: 2019-08-20 | Discharge: 2019-08-20 | Disposition: A | Discharge status: Home / Self Care | Payer: PPO | Source: Ambulatory Visit | Attending: Oncology | Admitting: Oncology

## 2019-08-20 ENCOUNTER — Other Ambulatory Visit: Payer: Self-pay

## 2019-08-20 DIAGNOSIS — Z87891 Personal history of nicotine dependence: Secondary | ICD-10-CM | POA: Insufficient documentation

## 2019-08-20 DIAGNOSIS — Z122 Encounter for screening for malignant neoplasm of respiratory organs: Secondary | ICD-10-CM | POA: Insufficient documentation

## 2019-08-23 ENCOUNTER — Encounter: Payer: Self-pay | Admitting: *Deleted

## 2019-08-27 ENCOUNTER — Other Ambulatory Visit (INDEPENDENT_AMBULATORY_CARE_PROVIDER_SITE_OTHER): Payer: Self-pay | Admitting: Nurse Practitioner

## 2019-08-28 ENCOUNTER — Other Ambulatory Visit: Payer: Self-pay

## 2019-09-10 ENCOUNTER — Other Ambulatory Visit: Payer: Self-pay | Admitting: Family Medicine

## 2019-09-10 DIAGNOSIS — M5441 Lumbago with sciatica, right side: Secondary | ICD-10-CM

## 2019-09-10 NOTE — Telephone Encounter (Signed)
Requested medication (s) are due for refill today: yes  Requested medication (s) are on the active medication list:  yes  Last refill:  08/10/2019  Future visit scheduled: no  Notes to clinic:  overdue for office visit and drug screen    Requested Prescriptions  Pending Prescriptions Disp Refills   oxyCODONE (ROXICODONE) 15 MG immediate release tablet 120 tablet 0    Sig: Take 1 tablet (15 mg total) by mouth 4 (four) times daily as needed for pain.      Not Delegated - Analgesics:  Opioid Agonists Failed - 09/10/2019  2:53 PM      Failed - This refill cannot be delegated      Failed - Urine Drug Screen completed in last 360 days.      Failed - Valid encounter within last 6 months    Recent Outpatient Visits           11 months ago Annual physical exam   [facility] [name], MD   1 year ago Onychomycosis   [facility] [name], MD   1 year ago Painful urination   [facility] Mar [place], [place]   2 years ago Essential hypertension   [facility] [name], MD   2 years ago Chronic low back pain (Location of Primary Source of Pain) (Bilateral) (R>L)   [facility] [name], MD

## 2019-09-10 NOTE — Telephone Encounter (Signed)
Copied from [place] [phone]. Topic: Quick Communication - Rx Refill/Question >> Sep 10, 2019  2:46 PM Mcneil, Ja-Kwan wrote: Medication: oxyCODONE (ROXICODONE) 15 MG immediate release tablet  Has the patient contacted their pharmacy? no  Preferred Pharmacy (with phone number or street name): [facility] STORE #[id number] [facility], [place] [address] AT [place] Phone: [phone]   Fax: [phone]  Agent: Please be advised that RX refills may take up to 3 business days. We ask that you follow-up with your pharmacy.

## 2019-09-11 MED ORDER — OXYCODONE HCL 15 MG PO TABS: 15.0000 mg | ORAL_TABLET | Freq: Four times a day (QID) | ORAL | 0 refills | Status: DC | PRN

## 2019-10-10 ENCOUNTER — Other Ambulatory Visit: Payer: Self-pay | Admitting: Family Medicine

## 2019-10-10 DIAGNOSIS — M5442 Lumbago with sciatica, left side: Secondary | ICD-10-CM

## 2019-10-10 NOTE — Telephone Encounter (Signed)
Requested medication (s) are due for refill today: yes  Requested medication (s) are on the active medication list: yes  Last refill:  09/11/19 #120 0 refills  Future visit scheduled: no  Notes to clinic:  not delegated per protocol, greater than 6 month encounter     Requested Prescriptions  Pending Prescriptions Disp Refills   oxyCODONE (ROXICODONE) 15 MG immediate release tablet 120 tablet 0    Sig: Take 1 tablet (15 mg total) by mouth 4 (four) times daily as needed for pain.      Not Delegated - Analgesics:  Opioid Agonists Failed - 10/10/2019  3:29 PM      Failed - This refill cannot be delegated      Failed - Urine Drug Screen completed in last 360 days.      Failed - Valid encounter within last 6 months    Recent Outpatient Visits           1 year ago Annual physical exam   [facility] [name], MD   1 year ago Onychomycosis   [facility] [name], [facility]   1 year ago Painful urination   [facility] Mar [place], [place]   2 years ago Essential hypertension   [facility] [name], MD   2 years ago Chronic low back pain (Location of Primary Source of Pain) (Bilateral) (R>L)   [facility] [name], MD

## 2019-10-10 NOTE — Telephone Encounter (Signed)
PT need a refill  oxyCODONE (ROXICODONE) 15 MG immediate release tablet [[id number]]  [facility] DR[facility] STORE #[id number] [facility], [place] [address] AT [place]  [place] [address]  Phone: [phone] Fax: [phone]

## 2019-10-11 MED ORDER — OXYCODONE HCL 15 MG PO TABS: 15.0000 mg | ORAL_TABLET | Freq: Four times a day (QID) | ORAL | 0 refills | Status: DC | PRN

## 2019-11-08 ENCOUNTER — Other Ambulatory Visit: Payer: Self-pay | Admitting: Family Medicine

## 2019-11-08 DIAGNOSIS — G8929 Other chronic pain: Secondary | ICD-10-CM

## 2019-11-08 NOTE — Telephone Encounter (Signed)
Requested medication (s) are due for refill today:yes  Requested medication (s) are on the active medication list: yes  Last refill:  10/10/2019  Future visit scheduled: no  Notes to clinic:  this refill cannot be delegated    Requested Prescriptions  Pending Prescriptions Disp Refills   oxyCODONE (ROXICODONE) 15 MG immediate release tablet 120 tablet 0    Sig: Take 1 tablet (15 mg total) by mouth 4 (four) times daily as needed for pain.      Not Delegated - Analgesics:  Opioid Agonists Failed - 11/08/2019  3:58 PM      Failed - This refill cannot be delegated      Failed - Urine Drug Screen completed in last 360 days.      Failed - Valid encounter within last 6 months    Recent Outpatient Visits           1 year ago Annual physical exam   [facility] [name], MD   1 year ago Onychomycosis   [facility] [name], MD   1 year ago Painful urination   [facility] Mar [place], [place]   2 years ago Essential hypertension   [facility] [name], MD   3 years ago Chronic low back pain (Location of Primary Source of Pain) (Bilateral) (R>L)   [facility] [name], MD

## 2019-11-08 NOTE — Telephone Encounter (Signed)
Copied from [place]. Topic: Quick Communication - Rx Refill/Question >> Nov 08, 2019  3:17 PM Leward Quan A wrote: Medication: oxyCODONE (ROXICODONE) 15 MG immediate release tablet   Has the patient contacted their pharmacy? Yes (Agent: If no, request that the patient contact the pharmacy for the refill.) (Agent: If yes, when and what did the pharmacy advise?)  Preferred Pharmacy (with phone number or street name): [facility] DR[facility] STORE #[id number] [facility], [place] [address] AT [place]  Phone:  [phone] Fax:  [phone]    Agent: Please be advised that RX refills may take up to 3 business days. We ask that you follow-up with your pharmacy.

## 2019-11-11 MED ORDER — OXYCODONE HCL 15 MG PO TABS: 15.0000 mg | ORAL_TABLET | Freq: Four times a day (QID) | ORAL | 0 refills | Status: DC | PRN

## 2019-11-13 ENCOUNTER — Other Ambulatory Visit: Payer: Self-pay | Admitting: Family Medicine

## 2019-11-13 DIAGNOSIS — E7800 Pure hypercholesterolemia, unspecified: Secondary | ICD-10-CM

## 2019-11-13 NOTE — Telephone Encounter (Signed)
Requested medications are due for refill today?  Yes   Requested medications are on active medication list?  Yes  Last Refill:  08/20/2018  # 90 with 4 refills for each medication  Future visit scheduled? No   Notes to Clinic:  Hydrochlorothiazide failed RX refill protocol due to no valid encounter in past 6 months.  Last office visit was 09/27/2018.   Atorvastatin failed Rx refill protocol due to no encounter in the past 12 months and no labs in the past 360 days.  Last office visit on 09/27/2018.  Last labs were performed on 09/27/2018.

## 2019-11-19 ENCOUNTER — Other Ambulatory Visit: Payer: Self-pay | Admitting: Family Medicine

## 2019-11-19 NOTE — Telephone Encounter (Signed)
Medication Refill - Medication: Trazodone and HCTZ  Has the patient contacted their pharmacy? Yes.   (Agent: If no, request that the patient contact the pharmacy for the refill.) (Agent: If yes, when and what did the pharmacy advise?)  Preferred Pharmacy (with phone number or street name): [place], [place] [address] AT [place]: Please be advised that RX refills may take up to 3 business days. We ask that you follow-up with your pharmacy.

## 2019-11-25 ENCOUNTER — Other Ambulatory Visit (INDEPENDENT_AMBULATORY_CARE_PROVIDER_SITE_OTHER): Payer: Self-pay | Admitting: Nurse Practitioner

## 2019-11-26 ENCOUNTER — Ambulatory Visit: Payer: PPO | Admitting: Family Medicine

## 2019-11-26 NOTE — Progress Notes (Signed)
Established patient visit   Patient: Justin Harrington   DOB: Sep 10, 1945   74 y.o. Male  MRN: [id number] Visit Date: 11/27/2019  Today's healthcare provider: [name], MD   Chief Complaint  Patient presents with  . Hyperlipidemia  . Hypertension  . Pain Management  . Insomnia   Subjective    HPI  Follow up for Chronic low back pain:  The patient was last seen for this 1 year ago. Changes made at last visit include none; continue same medication.  He reports good compliance with treatment. He feels that condition is Worse. He is not having side effects.   -----------------------------------------------------------------------------------------  Lipid/Cholesterol, Follow-up  Last lipid panel Other pertinent labs  Lab Results  Component Value Date   CHOL 126 09/27/2018   HDL 44 09/27/2018   LDLCALC 63 09/27/2018   TRIG 96 09/27/2018   CHOLHDL 2.9 09/27/2018   Lab Results  Component Value Date   ALT 7 09/27/2018   AST 16 09/27/2018   PLT 165 03/30/2018     He was last seen for this 1 year ago.  Management since that visit includes continue same medication.  He reports good compliance with treatment. He is not having side effects.   Symptoms: No chest pain No chest pressure/discomfort  No dyspnea No lower extremity edema  No numbness or tingling of extremity No orthopnea  No palpitations No paroxysmal nocturnal dyspnea  No speech difficulty No syncope   Current diet: on average, 1 meals per day Current exercise: yard work  The ASCVD Risk score Mikey Bussing DC Jr., et al., 2013) failed to calculate for the following reasons:   The valid total cholesterol range is 130 to 320 mg/dL  ---------------------------------------------------------------------------------------------------  Hypertension, follow-up  BP Readings from Last 3 Encounters:  11/27/19 129/77  09/27/18 122/66  04/07/18 (!) 144/77   Wt Readings from Last 3 Encounters:  11/27/19 154  lb (69.9 kg)  08/20/19 161 lb (73 kg)  09/27/18 161 lb 1.6 oz (73.1 kg)     He was last seen for hypertension 1 year ago.  BP at that visit was 122/66. Management since that visit includes continuing same medications.  He reports good compliance with treatment. He is not having side effects.  He is following a Regular diet. He is exercising. He does smoke.  Use of agents associated with hypertension: NSAIDS.   Outside blood pressures are not checked. Symptoms: No chest pain No chest pressure  No palpitations No syncope  No dyspnea No orthopnea  No paroxysmal nocturnal dyspnea No lower extremity edema   Pertinent labs: Lab Results  Component Value Date   CHOL 126 09/27/2018   HDL 44 09/27/2018   LDLCALC 63 09/27/2018   TRIG 96 09/27/2018   CHOLHDL 2.9 09/27/2018   Lab Results  Component Value Date   NA 141 11/28/2018   K 3.6 11/28/2018   CREATININE 1.25 11/28/2018   GFRNONAA 57 (L) 11/28/2018   GFRAA 66 11/28/2018   GLUCOSE 122 (H) 11/28/2018     The ASCVD Risk score (Goff DC Jr., et al., 2013) failed to calculate for the following reasons:   The valid total cholesterol range is 130 to 320 mg/dL   ---------------------------------------------------------------------------------------------------  Follow up for Insomnia:  The patient was last seen for this 1 year ago. Changes made at last visit include none; continue same medication.  He reports good compliance with treatment. He feels that condition is Unchanged. He is not having side effects.   -----------------------------------------------------------------------------------------  He states he continues to have  Lot of trouble with constipation and has to take several laxatives every day. He had been prescribed Amitiza, but there has been no prescription refill for several months and he does not recall how effective this was for him.     Medications: Outpatient Medications Prior to Visit  Medication Sig   . acetaminophen (TYLENOL) 325 MG tablet Take 1-2 tablets (325-650 mg total) by mouth every 6 (six) hours as needed for mild pain (or temp >/= 101 F).  Marland Kitchen atorvastatin (LIPITOR) 80 MG tablet TAKE 1 TABLET(80 MG) BY MOUTH DAILY  . clopidogrel (PLAVIX) 75 MG tablet TAKE 1 TABLET(75 MG) BY MOUTH DAILY  . hydrochlorothiazide (HYDRODIURIL) 25 MG tablet Take 1 tablet (25 mg total) by mouth daily.  Marland Kitchen oxyCODONE (ROXICODONE) 15 MG immediate release tablet Take 1 tablet (15 mg total) by mouth 4 (four) times daily as needed for pain.  . Sennosides (EX-LAX PO) Take 3-4 tablets by mouth every 3 (three) days as needed (constipation).   . [DISCONTINUED] lubiprostone (AMITIZA) 24 MCG capsule Take 1 capsule (24 mcg total) by mouth 2 (two) times daily with a meal.  . [DISCONTINUED] traZODone (DESYREL) 150 MG tablet TAKE 1 TABLET(150 MG) BY MOUTH AT BEDTIME AS NEEDED  . [DISCONTINUED] aspirin EC 81 MG tablet Take 81 mg by mouth every evening. Alternates EOD with aspirin. (Patient not taking: Reported on 11/27/2019)  . [DISCONTINUED] doxylamine, Sleep, (UNISOM) 25 MG tablet Take 25 mg by mouth at bedtime as needed for sleep.  (Patient not taking: Reported on 11/27/2019)  . [DISCONTINUED] FLUZONE HIGH-DOSE QUADRIVALENT 0.7 ML SUSY ADM 0.7ML IM UTD  . [DISCONTINUED] terbinafine (LAMISIL) 250 MG tablet Take 2 tablets (500 mg total) by mouth daily. Take for 1 week out of every 4 weeks (Patient not taking: Reported on 11/27/2019)   No facility-administered medications prior to visit.    Review of Systems  Constitutional: Negative for appetite change, chills and fever.  Respiratory: Negative for chest tightness, shortness of breath and wheezing.   Cardiovascular: Negative for chest pain and palpitations.  Gastrointestinal: Negative for abdominal pain, nausea and vomiting.  Musculoskeletal: Positive for back pain.  Psychiatric/Behavioral: Positive for sleep disturbance.     Objective    BP 129/77 (BP Location: Right  Arm, Patient Position: Sitting, Cuff Size: Normal)   Pulse 92   Temp 98.1 F (36.7 C) (Oral)   Resp 16   Wt 154 lb (69.9 kg)   BMI 20.32 kg/m    Physical Exam   General: Appearance:    Well developed, well nourished male in no acute distress  Eyes:    PERRL, conjunctiva/corneas clear, EOM's intact       Lungs:     Clear to auscultation bilaterally, respirations unlabored  Heart:    Normal heart rate. Normal rhythm. No murmurs, rubs, or gallops.   MS:   All extremities are intact.   Neurologic:   Awake, alert, oriented x 3. No apparent focal neurological           defect.         Assessment & Plan     1. Primary hypertension Well controlled.  Continue current medications.    2. Opioid-induced constipation (OIC) Start back on - lubiprostone (AMITIZA) 24 MCG capsule; Take 1 capsule (24 mcg total) by mouth 2 (two) times daily with a meal.  Dispense: 60 capsule; Refill: 4  3. Coronary artery disease involving native coronary artery of native heart without angina pectoris  Asymptomatic. Compliant with medication.  Continue aggressive risk factor modification.    4. Hypercholesteremia He is tolerating atorvastatin well with no adverse effects.   - CBC - Comprehensive metabolic panel - Lipid panel  5. Insomnia Refill- traZODone (DESYREL) 150 MG tablet; TAKE 1 TABLET(150 MG) BY MOUTH AT BEDTIME AS NEEDED  Dispense: 90 tablet; Refill: 4   No follow-ups on file.      The entirety of the information documented in the History of Present Illness, Review of Systems and Physical Exam were personally obtained by me. Portions of this information were initially documented by the CMA and reviewed by me for thoroughness and accuracy.      [name], MD  [facility] [phone] (phone) [phone] (fax)  [place]

## 2019-11-26 NOTE — Telephone Encounter (Signed)
The pt was last seen on 02/17/2018 for AAA and cholesteremia.  The pt was instructed to  continue statins and was reccommended to undergo a endovascular repair, and continue antiplatelet's . The pt was to follow up with Dr. Lucky Cowboy after the procedure , A peripheral vascular cath  was performed on 03/29/18  but the pt has no appointments scheduled in our office.

## 2019-11-27 ENCOUNTER — Other Ambulatory Visit: Payer: Self-pay

## 2019-11-27 ENCOUNTER — Encounter: Payer: Self-pay | Admitting: Family Medicine

## 2019-11-27 ENCOUNTER — Ambulatory Visit (INDEPENDENT_AMBULATORY_CARE_PROVIDER_SITE_OTHER): Payer: PPO | Admitting: Family Medicine

## 2019-11-27 VITALS — BP 129/77 | HR 92 | Temp 98.1°F | Resp 16 | Wt 154.0 lb

## 2019-11-27 DIAGNOSIS — I10 Essential (primary) hypertension: Secondary | ICD-10-CM

## 2019-11-27 DIAGNOSIS — K5903 Drug induced constipation: Secondary | ICD-10-CM

## 2019-11-27 DIAGNOSIS — T402X5A Adverse effect of other opioids, initial encounter: Secondary | ICD-10-CM

## 2019-11-27 DIAGNOSIS — E7800 Pure hypercholesterolemia, unspecified: Secondary | ICD-10-CM

## 2019-11-27 DIAGNOSIS — I2510 Atherosclerotic heart disease of native coronary artery without angina pectoris: Secondary | ICD-10-CM

## 2019-11-27 MED ORDER — LUBIPROSTONE 24 MCG PO CAPS: 24.0000 ug | ORAL_CAPSULE | Freq: Two times a day (BID) | ORAL | 4 refills | Status: DC

## 2019-11-27 MED ORDER — TRAZODONE HCL 150 MG PO TABS
ORAL_TABLET | ORAL | 4 refills | Status: DC
Start: 2019-11-27 — End: 2019-12-21

## 2019-11-27 NOTE — Telephone Encounter (Signed)
Patient will need an appointment with [facility]

## 2019-11-28 LAB — COMPREHENSIVE METABOLIC PANEL
ALT: 10 IU/L (ref 0–44)
AST: 17 IU/L (ref 0–40)
Albumin/Globulin Ratio: 1.9 (ref 1.2–2.2)
Albumin: 4.3 g/dL (ref 3.7–4.7)
Alkaline Phosphatase: 64 IU/L (ref 44–121)
BUN/Creatinine Ratio: 13 (ref 10–24)
BUN: 18 mg/dL (ref 8–27)
Bilirubin Total: 0.4 mg/dL (ref 0.0–1.2)
CO2: 24 mmol/L (ref 20–29)
Calcium: 9.4 mg/dL (ref 8.6–10.2)
Chloride: 101 mmol/L (ref 96–106)
Creatinine, Ser: 1.42 mg/dL — ABNORMAL HIGH (ref 0.76–1.27)
GFR calc Af Amer: 56 mL/min/{1.73_m2} — ABNORMAL LOW (ref >59)
GFR calc non Af Amer: 49 mL/min/{1.73_m2} — ABNORMAL LOW (ref >59)
Globulin, Total: 2.3 g/dL (ref 1.5–4.5)
Glucose: 91 mg/dL (ref 65–99)
Potassium: 3.6 mmol/L (ref 3.5–5.2)
Sodium: 141 mmol/L (ref 134–144)
Total Protein: 6.6 g/dL (ref 6.0–8.5)

## 2019-11-28 LAB — CBC
Hematocrit: 40.6 % (ref 37.5–51.0)
Hemoglobin: 14 g/dL (ref 13.0–17.7)
MCH: 31.9 pg (ref 26.6–33.0)
MCHC: 34.5 g/dL (ref 31.5–35.7)
MCV: 93 fL (ref 79–97)
Platelets: 166 10*3/uL (ref 150–450)
RBC: 4.39 x10E6/uL (ref 4.14–5.80)
RDW: 12.6 % (ref 11.6–15.4)
WBC: 9.8 10*3/uL (ref 3.4–10.8)

## 2019-11-28 LAB — LIPID PANEL
Chol/HDL Ratio: 3 ratio (ref 0.0–5.0)
Cholesterol, Total: 127 mg/dL (ref 100–199)
HDL: 43 mg/dL (ref >39)
LDL Chol Calc (NIH): 65 mg/dL (ref 0–99)
Triglycerides: 105 mg/dL (ref 0–149)
VLDL Cholesterol Cal: 19 mg/dL (ref 5–40)

## 2019-12-04 NOTE — Telephone Encounter (Signed)
Could you please call the pt and schedule them per the NP. I tried to call and make them aware they must be seen before they can have a refill on their blood thinner  but the pt's phone does not ring.

## 2019-12-11 ENCOUNTER — Other Ambulatory Visit: Payer: Self-pay | Admitting: Family Medicine

## 2019-12-11 DIAGNOSIS — E7800 Pure hypercholesterolemia, unspecified: Secondary | ICD-10-CM

## 2019-12-11 DIAGNOSIS — G8929 Other chronic pain: Secondary | ICD-10-CM

## 2019-12-11 MED ORDER — ATORVASTATIN CALCIUM 80 MG PO TABS: ORAL_TABLET | ORAL | 4 refills | Status: DC

## 2019-12-11 MED ORDER — OXYCODONE HCL 15 MG PO TABS: 15.0000 mg | ORAL_TABLET | Freq: Four times a day (QID) | ORAL | 0 refills | Status: DC | PRN

## 2019-12-11 NOTE — Telephone Encounter (Signed)
Requested Prescriptions  Pending Prescriptions Disp Refills  . oxyCODONE (ROXICODONE) 15 MG immediate release tablet 120 tablet 0    Sig: Take 1 tablet (15 mg total) by mouth 4 (four) times daily as needed for pain.     Not Delegated - Analgesics:  Opioid Agonists Failed - 12/11/2019 10:19 AM      Failed - This refill cannot be delegated      Failed - Urine Drug Screen completed in last 360 days      Passed - Valid encounter within last 6 months    Recent Outpatient Visits          2 weeks ago Primary hypertension   [facility] [name], MD   1 year ago Annual physical exam   [facility] [name], MD   1 year ago Onychomycosis   [facility] [name], MD   1 year ago Painful urination   [facility] Fenton Malling M, [place]   2 years ago Essential hypertension   [facility] [name], MD             . atorvastatin (LIPITOR) 80 MG tablet 90 tablet 4    Sig: TAKE 1 TABLET(80 MG) BY MOUTH DAILY     Cardiovascular:  Antilipid - Statins Failed - 12/11/2019 10:19 AM      Failed - LDL in normal range and within 360 days    LDL Chol Calc (NIH)  Date Value Ref Range Status  11/27/2019 65 0 - 99 mg/dL Final         Passed - Total Cholesterol in normal range and within 360 days    Cholesterol, Total  Date Value Ref Range Status  11/27/2019 127 100 - 199 mg/dL Final         Passed - HDL in normal range and within 360 days    HDL  Date Value Ref Range Status  11/27/2019 43 >39 mg/dL Final         Passed - Triglycerides in normal range and within 360 days    Triglycerides  Date Value Ref Range Status  11/27/2019 105 0 - 149 mg/dL Final         Passed - Patient is not pregnant      Passed - Valid encounter within last 12 months    Recent Outpatient Visits          2 weeks ago Primary hypertension   [facility] [name], MD   1 year ago  Annual physical exam   [facility] [name], MD   1 year ago Onychomycosis   [facility] [name], MD   1 year ago Painful urination   [facility] Fenton Malling M, [place]   2 years ago Essential hypertension   [facility] [name], MD

## 2019-12-11 NOTE — Telephone Encounter (Signed)
Requested medication (s) are due for refill today: yes  Requested medication (s) are on the active medication list: yes  Last refill:  11/11/19  Future visit scheduled: no  Notes to clinic:  not delegated    Requested Prescriptions  Pending Prescriptions Disp Refills   oxyCODONE (ROXICODONE) 15 MG immediate release tablet 120 tablet 0    Sig: Take 1 tablet (15 mg total) by mouth 4 (four) times daily as needed for pain.      Not Delegated - Analgesics:  Opioid Agonists Failed - 12/11/2019 10:19 AM      Failed - This refill cannot be delegated      Failed - Urine Drug Screen completed in last 360 days      Passed - Valid encounter within last 6 months    Recent Outpatient Visits           2 weeks ago Primary hypertension   [facility] [name], MD   1 year ago Annual physical exam   [facility] [name], MD   1 year ago Onychomycosis   [facility] [name], MD   1 year ago Painful urination   [facility] Fenton Malling M, [place]   2 years ago Essential hypertension   [facility] [name], MD               Signed Prescriptions Disp Refills   atorvastatin (LIPITOR) 80 MG tablet 90 tablet 4    Sig: TAKE 1 TABLET(80 MG) BY MOUTH DAILY      Cardiovascular:  Antilipid - Statins Failed - 12/11/2019 10:19 AM      Failed - LDL in normal range and within 360 days    LDL Chol Calc (NIH)  Date Value Ref Range Status  11/27/2019 65 0 - 99 mg/dL Final          Passed - Total Cholesterol in normal range and within 360 days    Cholesterol, Total  Date Value Ref Range Status  11/27/2019 127 100 - 199 mg/dL Final          Passed - HDL in normal range and within 360 days    HDL  Date Value Ref Range Status  11/27/2019 43 >39 mg/dL Final          Passed - Triglycerides in normal range and within 360 days    Triglycerides  Date Value Ref Range Status   11/27/2019 105 0 - 149 mg/dL Final          Passed - Patient is not pregnant      Passed - Valid encounter within last 12 months    Recent Outpatient Visits           2 weeks ago Primary hypertension   [facility] [name], MD   1 year ago Annual physical exam   [facility] [name], MD   1 year ago Onychomycosis   [facility] [name], MD   1 year ago Painful urination   [facility] Fenton Malling M, [place]   2 years ago Essential hypertension   [facility] [name], MD

## 2019-12-11 NOTE — Telephone Encounter (Signed)
Pt is calling and needs a refill on oxycodone and also would like to know if he still suppose to be taking atorvastatin 80 mg if so he will need a refill. [place]

## 2019-12-12 ENCOUNTER — Emergency Department: Payer: PPO

## 2019-12-12 ENCOUNTER — Other Ambulatory Visit: Payer: Self-pay

## 2019-12-12 ENCOUNTER — Ambulatory Visit: Payer: Self-pay | Admitting: Family Medicine

## 2019-12-12 ENCOUNTER — Observation Stay
Admission: EM | Admit: 2019-12-12 | Discharge: 2019-12-13 | Disposition: A | Discharge status: Home / Self Care | Payer: PPO | Attending: Student | Admitting: Student

## 2019-12-12 ENCOUNTER — Encounter: Payer: Self-pay | Admitting: Emergency Medicine

## 2019-12-12 DIAGNOSIS — Z79891 Long term (current) use of opiate analgesic: Secondary | ICD-10-CM

## 2019-12-12 DIAGNOSIS — R55 Syncope and collapse: Principal | ICD-10-CM | POA: Diagnosis present

## 2019-12-12 DIAGNOSIS — F17210 Nicotine dependence, cigarettes, uncomplicated: Secondary | ICD-10-CM | POA: Diagnosis not present

## 2019-12-12 DIAGNOSIS — Z79899 Other long term (current) drug therapy: Secondary | ICD-10-CM | POA: Diagnosis not present

## 2019-12-12 DIAGNOSIS — Z20822 Contact with and (suspected) exposure to covid-19: Secondary | ICD-10-CM | POA: Insufficient documentation

## 2019-12-12 DIAGNOSIS — G8929 Other chronic pain: Secondary | ICD-10-CM | POA: Diagnosis present

## 2019-12-12 DIAGNOSIS — Z8546 Personal history of malignant neoplasm of prostate: Secondary | ICD-10-CM | POA: Diagnosis not present

## 2019-12-12 DIAGNOSIS — S0990XA Unspecified injury of head, initial encounter: Secondary | ICD-10-CM | POA: Diagnosis not present

## 2019-12-12 DIAGNOSIS — I10 Essential (primary) hypertension: Secondary | ICD-10-CM | POA: Diagnosis present

## 2019-12-12 DIAGNOSIS — G894 Chronic pain syndrome: Secondary | ICD-10-CM | POA: Diagnosis present

## 2019-12-12 DIAGNOSIS — I2510 Atherosclerotic heart disease of native coronary artery without angina pectoris: Secondary | ICD-10-CM | POA: Diagnosis not present

## 2019-12-12 DIAGNOSIS — J439 Emphysema, unspecified: Secondary | ICD-10-CM | POA: Diagnosis not present

## 2019-12-12 DIAGNOSIS — I779 Disorder of arteries and arterioles, unspecified: Secondary | ICD-10-CM | POA: Diagnosis present

## 2019-12-12 DIAGNOSIS — J3489 Other specified disorders of nose and nasal sinuses: Secondary | ICD-10-CM | POA: Diagnosis not present

## 2019-12-12 DIAGNOSIS — J341 Cyst and mucocele of nose and nasal sinus: Secondary | ICD-10-CM | POA: Diagnosis not present

## 2019-12-12 DIAGNOSIS — N179 Acute kidney failure, unspecified: Secondary | ICD-10-CM

## 2019-12-12 DIAGNOSIS — Z86718 Personal history of other venous thrombosis and embolism: Secondary | ICD-10-CM

## 2019-12-12 DIAGNOSIS — R21 Rash and other nonspecific skin eruption: Secondary | ICD-10-CM | POA: Diagnosis not present

## 2019-12-12 LAB — RESPIRATORY PANEL BY RT PCR (FLU A&B, COVID)
Influenza A by PCR: NEGATIVE
Influenza B by PCR: NEGATIVE
SARS Coronavirus 2 by RT PCR: NEGATIVE

## 2019-12-12 LAB — CBC
HCT: 38.7 % — ABNORMAL LOW (ref 39.0–52.0)
HCT: 39.7 % (ref 39.0–52.0)
Hemoglobin: 13.1 g/dL (ref 13.0–17.0)
Hemoglobin: 13.5 g/dL (ref 13.0–17.0)
MCH: 32.2 pg (ref 26.0–34.0)
MCH: 32.8 pg (ref 26.0–34.0)
MCHC: 33.9 g/dL (ref 30.0–36.0)
MCHC: 34 g/dL (ref 30.0–36.0)
MCV: 95.1 fL (ref 80.0–100.0)
MCV: 96.4 fL (ref 80.0–100.0)
Platelets: 165 10*3/uL (ref 150–400)
Platelets: 157 10*3/uL (ref 150–400)
RBC: 4.07 MIL/uL — ABNORMAL LOW (ref 4.22–5.81)
RBC: 4.12 MIL/uL — ABNORMAL LOW (ref 4.22–5.81)
RDW: 12.9 % (ref 11.5–15.5)
RDW: 12.9 % (ref 11.5–15.5)
WBC: 7.9 10*3/uL (ref 4.0–10.5)
WBC: 8 10*3/uL (ref 4.0–10.5)
nRBC: 0 % (ref 0.0–0.2)
nRBC: 0 % (ref 0.0–0.2)

## 2019-12-12 LAB — TROPONIN I (HIGH SENSITIVITY)
Troponin I (High Sensitivity): 15 ng/L (ref <18)
Troponin I (High Sensitivity): 13 ng/L (ref <18)

## 2019-12-12 LAB — BASIC METABOLIC PANEL
Anion gap: 8 (ref 5–15)
BUN: 23 mg/dL (ref 8–23)
CO2: 29 mmol/L (ref 22–32)
Calcium: 8.8 mg/dL — ABNORMAL LOW (ref 8.9–10.3)
Chloride: 100 mmol/L (ref 98–111)
Creatinine, Ser: 1.54 mg/dL — ABNORMAL HIGH (ref 0.61–1.24)
GFR, Estimated: 47 mL/min — ABNORMAL LOW (ref >60)
Glucose, Bld: 120 mg/dL — ABNORMAL HIGH (ref 70–99)
Potassium: 3.8 mmol/L (ref 3.5–5.1)
Sodium: 137 mmol/L (ref 135–145)

## 2019-12-12 LAB — CREATININE, SERUM
Creatinine, Ser: 1.51 mg/dL — ABNORMAL HIGH (ref 0.61–1.24)
GFR, Estimated: 48 mL/min — ABNORMAL LOW (ref >60)

## 2019-12-12 LAB — FIBRIN DERIVATIVES D-DIMER (ARMC ONLY): Fibrin derivatives D-dimer (ARMC): 3307.07 ng/mL (FEU) — ABNORMAL HIGH (ref 0.00–499.00)

## 2019-12-12 MED ORDER — ENOXAPARIN SODIUM 40 MG/0.4ML ~~LOC~~ SOLN: 40.0000 mg | SUBCUTANEOUS | Status: DC

## 2019-12-12 MED ORDER — ONDANSETRON HCL 4 MG PO TABS: 4.0000 mg | ORAL_TABLET | Freq: Four times a day (QID) | ORAL | Status: DC | PRN

## 2019-12-12 MED ORDER — ACETAMINOPHEN 325 MG PO TABS: 650.0000 mg | ORAL_TABLET | Freq: Four times a day (QID) | ORAL | Status: DC | PRN

## 2019-12-12 MED ORDER — ACETAMINOPHEN 650 MG RE SUPP: 650.0000 mg | Freq: Four times a day (QID) | RECTAL | Status: DC | PRN

## 2019-12-12 MED ORDER — SODIUM CHLORIDE 0.9% FLUSH: 3.0000 mL | Freq: Two times a day (BID) | INTRAVENOUS | Status: DC

## 2019-12-12 MED ORDER — LUBIPROSTONE 24 MCG PO CAPS
24.0000 ug | ORAL_CAPSULE | Freq: Two times a day (BID) | ORAL | Status: DC
  Filled 2019-12-12: qty 1

## 2019-12-12 MED ORDER — ACETAMINOPHEN 325 MG PO TABS: 325.0000 mg | ORAL_TABLET | Freq: Four times a day (QID) | ORAL | Status: DC | PRN

## 2019-12-12 MED ORDER — ONDANSETRON HCL 4 MG/2ML IJ SOLN: 4.0000 mg | Freq: Four times a day (QID) | INTRAMUSCULAR | Status: DC | PRN

## 2019-12-12 MED ORDER — NICOTINE 7 MG/24HR TD PT24
7.0000 mg | MEDICATED_PATCH | Freq: Once | TRANSDERMAL | Status: DC
  Administered 2019-12-12: 7 mg via TRANSDERMAL
  Filled 2019-12-12: qty 1

## 2019-12-12 MED ORDER — ATORVASTATIN CALCIUM 80 MG PO TABS: 80.0000 mg | ORAL_TABLET | Freq: Every day | ORAL | Status: DC

## 2019-12-12 MED ORDER — SODIUM CHLORIDE 0.9 % IV SOLN: INTRAVENOUS | Status: DC

## 2019-12-12 MED ORDER — SODIUM CHLORIDE 0.9 % IV BOLUS
500.0000 mL | Freq: Once | INTRAVENOUS | Status: AC
  Administered 2019-12-12: 500 mL via INTRAVENOUS

## 2019-12-12 MED ORDER — OXYCODONE HCL 5 MG PO TABS
15.0000 mg | ORAL_TABLET | Freq: Four times a day (QID) | ORAL | Status: DC | PRN
  Administered 2019-12-12: 15 mg via ORAL
  Filled 2019-12-12: qty 3

## 2019-12-12 MED ORDER — CLOPIDOGREL BISULFATE 75 MG PO TABS: 75.0000 mg | ORAL_TABLET | Freq: Every day | ORAL | Status: DC

## 2019-12-12 NOTE — ED Notes (Signed)
Pt placed in a recliner

## 2019-12-12 NOTE — Telephone Encounter (Signed)
Pt's wife calling initially, pt present, spoke to pt and wife. States pt passed out "For a few seconds." States "We were hugging and I felt him give out, he fell and hit the floor." States LOC few seconds. Pt reports felt dizzy prior to episode, no other symptoms.  Reports has happened 3 times over last 6 months, last time 1 week ago. Does not check BP at home. Attempted to have pt and/or wife check pulse, unable to do so. Pt states "I feel fine now."  A&O x3. Pt did hit head when fell, no lacerations, "Bump from fall last week when he passed out."  Episode occurred few minutes  Prior to triage call. Advised ED, states will follow disposition, wife will drive.   Reason for Disposition . [1] Age > 50 years  AND [2] now alert and feels fine  Answer Assessment - Initial Assessment Questions 1. ONSET: "How long were you unconscious?" (minutes) "When did it happen?"     seconds 2. CONTENT: "What happened during period of unconsciousness?" (e.g., seizure activity)      Dizziness prior 3. MENTAL STATUS: "Alert and oriented now?" (oriented x 3 = name, month, location)      yes 4. TRIGGER: "What do you think caused the fainting?" "What were you doing just before you fainted?"  (e.g., exercise, sudden standing up, prolonged standing)     Got dizzy 5. RECURRENT SYMPTOM: "Have you ever passed out before?" If Yes, ask: "When was the last time?" and "What happened that time?"      Yes 1 week ago 6. INJURY: "Did you sustain any injury during the fall?"      Hit head 7. CARDIAC SYMPTOMS: "Have you had any of the following symptoms: chest pain, difficulty breathing, palpitations?"     no 8. NEUROLOGIC SYMPTOMS: "Have you had any of the following symptoms: headache, numbness, vertigo, weakness?"     dizziness 9. GI SYMPTOMS: "Have you had any of the following symptoms: abdominal pain, vomiting, diarrhea, blood in stools?"     no 10. OTHER SYMPTOMS: "Do you have any other symptoms?"       None  Protocols  used: [facility]

## 2019-12-12 NOTE — H&P (Signed)
History and Physical    Justin Harrington JME:[id number] DOB: 03/09/1945 DOA: 12/12/2019  PCP: [name], MD   Patient coming from: Home  I have personally briefly reviewed patient's old medical records in [place]  Chief Complaint: Collapse  HPI: Justin Harrington is a 74 y.o. 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  He apparently went to help his wife and passed out falling to the floor hitting his head.  Patient was previously in his usual state of health.  Patient reports that he has had for 5 of these episodes over the past year but this is the first time it was witnessed.  States they are usually brief and he gets up and he says normal self afterwards.  Has spoken to his PCP about it but has never had a work-up.  Says he could usually feel it coming on so he will sit down and we will prevent him from falling.  He denies recent illness.  Denied fever or chills, cough or shortness of breath, previous nausea or vomiting or change in bowel habits or abdominal pain.  Denied headache or visual disturbance or one-sided numbness weakness or tingling.  Denied chest pains or palpitations.  Syncopal episode was brief and there were no seizure-like activity and no urinary incontinence. ED Course: On arrival in the emergency room, patient was awake and alert.  Vital signs were within normal limits.  Blood work was at baseline.creatinine was 1.54, similar to creatinine over past several months but 18 months prior creatinine was as low as 0.97.  Troponin 15. EKG as reviewed by me : NSR rate of 84 with incomplete RBBB and no acute ST-T wave  changes Chest x-ray clear.  CT head with no acute findings Patient received an IV fluid bolus in the ER.  Hospitalist consulted for admission  Review of Systems: As per HPI otherwise all other systems on review of systems negative.    Past Medical History:  Diagnosis Date  . Aortic aneurysm ([place])   . Coronary artery disease   . DVT (deep venous thrombosis) ([place])    H/O DVT in left leg, filter placed approximatly 8 yrs ago  . H/O spinal fusion 1998   Back pain  . Hypertension    controlled on meds  . Personal history of tobacco use, presenting hazards to health 06/12/2015  . Personal history of tobacco use, presenting hazards to health 06/12/2015  . Prostate cancer [facility])    Had prostatectomy  . Wears dentures    upper    Past Surgical History:  Procedure Laterality Date  . BACK SURGERY  1998   Lumbar spine fusion  . carotid doppler ultrasound  10/05/2011   75-90% RCA occlusion, 50% on left  . CAROTID ENDARTERECTOMY Right 12/02/2011   Dr. Lucky Cowboy  . CHOLECYSTECTOMY  10/02/2010   Laparoscopic, Dr. Pat Patrick, [facility]  . COLONOSCOPY WITH PROPOFOL N/A 06/20/2017   Procedure: COLONOSCOPY WITH PROPOFOL;  Surgeon: [name], MD;  Location: [place];  Service: Endoscopy;  Laterality: N/A;  . 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 Cholelithiasis and acute cholecystiis. Mild enlargement  of left adrenal gland  . ENDOVASCULAR REPAIR/STENT GRAFT N/A 03/29/2018   Procedure: ENDOVASCULAR REPAIR/STENT GRAFT;  Surgeon: Algernon Huxley, MD;  Location: [place] [facility];  Service: Cardiovascular;  Laterality: N/A;  . LUMBAR SPINE HARDWARE REMOVAL  07/30/2016   [name], MD  . PROSTATE SURGERY  10/19/2012   prostatectomy. [facility] [employer]; Laser assisted, Done by [name] for prostate cancer     reports that he has been smoking cigarettes. He has a 25.00 pack-year smoking history. He has never used smokeless  tobacco. He reports that he does not drink alcohol and does not use drugs.  Allergies  Allergen Reactions  . Zolpidem     Other reaction(s): Other (See Comments) GOT UP IN MIDDLE OF NIGHT AND FILLED ALL THE GLASSES WITH FLOUR    Family History  Problem Relation Age of Onset  . Heart attack Mother   . Congestive Heart Failure Father   . Cerebral palsy Son       Prior to Admission medications   Medication Sig Start Date End Date Taking? Authorizing Provider  acetaminophen (TYLENOL) 325 MG tablet Take 1-2 tablets (325-650 mg total) by mouth every 6 (six) hours as needed for mild pain (or temp >/= 101 F). 03/30/18  Yes Stegmayer, Kimberly A, PA-C  atorvastatin (LIPITOR) 80 MG tablet TAKE 1 TABLET(80 MG) BY MOUTH DAILY 12/11/19  Yes [name], MD  clopidogrel (PLAVIX) 75 MG tablet TAKE 1 TABLET(75 MG) BY MOUTH DAILY Patient taking differently: Take 75 mg by mouth daily.  08/29/19  Yes Kris Hartmann, NP  hydrochlorothiazide (HYDRODIURIL) 25 MG tablet Take 1 tablet (25 mg total) by mouth daily. 08/20/18  Yes [name], MD  lubiprostone (AMITIZA) 24 MCG capsule Take 1 capsule (24 mcg total) by mouth 2 (two) times daily with a meal. 11/27/19  Yes [name], MD  oxyCODONE (ROXICODONE) 15 MG immediate release tablet Take 1 tablet (15 mg total) by mouth 4 (four) times daily as needed for pain. 12/11/19  Yes [name], MD  Sennosides (EX-LAX PO) Take 3-4 tablets by mouth every 3 (three) days as needed (constipation).    Yes [provider]  traZODone (DESYREL) 150 MG tablet TAKE 1 TABLET(150 MG) BY MOUTH AT BEDTIME AS NEEDED 11/27/19  Yes [name], MD    Physical Exam: Vitals:   12/12/19 1805 12/12/19 1923 12/12/19 2030 12/12/19 2100  BP: (!) 161/82 (!) 156/75 (!) 165/83 (!) 150/106  Pulse: 70 61  (!) 58  Resp: 16 12 17 14   Temp:      TempSrc:      SpO2: 99% 97%  99%  Weight:      Height:         Vitals:   12/12/19 1805 12/12/19 1923 12/12/19  2030 12/12/19 2100  BP: (!) 161/82 (!) 156/75 (!) 165/83 (!) 150/106  Pulse: 70 61  (!) 58  Resp: 16 12 17 14   Temp:      TempSrc:      SpO2: 99% 97%  99%  Weight:      Height:          Constitutional: Alert and oriented x 3 . Not in any apparent distress HEENT:      Head: Normocephalic and atraumatic.         Eyes: PERLA, EOMI, Conjunctivae are normal. Sclera is non-icteric.       Mouth/Throat: Mucous membranes are moist.       Neck:  Supple with no signs of meningismus. Cardiovascular: Regular rate and rhythm. No murmurs, gallops, or rubs. 2+ symmetrical distal pulses are present . No JVD. No LE edema Respiratory: Respiratory effort normal .Lungs sounds clear bilaterally. No wheezes, crackles, or rhonchi.  Gastrointestinal: Soft, non tender, and non distended with positive bowel sounds. No rebound or guarding. Genitourinary: No CVA tenderness. Musculoskeletal: Nontender with normal range of motion in all extremities. No cyanosis, or erythema of extremities. Neurologic:  Face is symmetric. Moving all extremities. No gross focal neurologic deficits . Skin: Skin is warm, dry.  No rash or ulcers Psychiatric: Mood and affect are norma    Labs on Admission: I have personally reviewed following labs and imaging studies  CBC: Recent Labs  Lab 12/12/19 1621  WBC 7.9  HGB 13.1  HCT 38.7*  MCV 95.1  PLT 096   Basic Metabolic Panel: Recent Labs  Lab 12/12/19 1621  NA 137  K 3.8  CL 100  CO2 29  GLUCOSE 120*  BUN 23  CREATININE 1.54*  CALCIUM 8.8*   GFR: Estimated Creatinine Clearance: 42.5 mL/min (A) (by C-G formula based on SCr of 1.54 mg/dL (H)). Liver Function Tests: No results for input(s): AST, ALT, ALKPHOS, BILITOT, PROT, ALBUMIN in the last 168 hours. No results for input(s): LIPASE, AMYLASE in the last 168 hours. No results for input(s): AMMONIA in the last 168 hours. Coagulation Profile: No results for input(s): INR, PROTIME in the last 168 hours. Cardiac  Enzymes: No results for input(s): CKTOTAL, CKMB, CKMBINDEX, TROPONINI in the last 168 hours. BNP (last 3 results) No results for input(s): PROBNP in the last 8760 hours. HbA1C: No results for input(s): HGBA1C in the last 72 hours. CBG: No results for input(s): GLUCAP in the last 168 hours. Lipid Profile: No results for input(s): CHOL, HDL, LDLCALC, TRIG, CHOLHDL, LDLDIRECT in the last 72 hours. Thyroid Function Tests: No results for input(s): TSH, T4TOTAL, FREET4, T3FREE, THYROIDAB in the last 72 hours. Anemia Panel: No results for input(s): VITAMINB12, FOLATE, FERRITIN, TIBC, IRON, RETICCTPCT in the last 72 hours. Urine analysis:    Component Value Date/Time   BILIRUBINUR Negative 04/07/2018 0808   PROTEINUR Negative 04/07/2018 0808   UROBILINOGEN 0.2 04/07/2018 0808   NITRITE Negative 04/07/2018 0808   LEUKOCYTESUR Negative 04/07/2018 0808    Radiological Exams on Admission: CT HEAD WO CONTRAST  Result Date: 12/12/2019 CLINICAL DATA:  Head trauma. EXAM: CT HEAD WITHOUT CONTRAST TECHNIQUE: Contiguous axial images were obtained from the base of the skull through the vertex without intravenous contrast. COMPARISON:  None. FINDINGS: Brain: No evidence of acute large vascular territory infarction, hemorrhage, hydrocephalus, extra-axial collection or mass lesion/mass effect. Vascular: Calcific atherosclerosis. Skull: No acute fracture. Sinuses/Orbits: Scattered mucosal thickening. Retention cyst in the right sphenoid sinus. Unremarkable orbits. Other: No mastoid effusions. IMPRESSION: No evidence of acute intracranial abnormality. Electronically Signed   By: Margaretha Sheffield MD   On: 12/12/2019 17:06   DG Chest Portable 1 View  Result Date: 12/12/2019 CLINICAL DATA:  74 year old male with syncope. EXAM: PORTABLE CHEST 1 VIEW COMPARISON:  Chest radiograph dated 08/16/2008 and CT dated 08/20/2019. FINDINGS: Emphysema and mild chronic interstitial coarsening. No focal consolidation,  pleural effusion, pneumothorax. A 7 mm nodular density over the right lower lung field, likely nipple shadow. The cardiac silhouette is within limits. Atherosclerotic calcification of the aorta. No acute osseous pathology. IMPRESSION: 1. No acute cardiopulmonary process. 2. Aortic Atherosclerosis (ICD10-I70.0) and Emphysema (ICD10-J43.9). Electronically Signed   By: Laren Everts.D.  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      Syncope and collapse -Etiology unclear.  Suspecting dehydration in combination with opiates and psychotropics but differential includes vascular/cardiac causes given history of CAD, PAD, smoking. -CT head negative -Syncope work-up to include echocardiogram, carotid Doppler, continuous cardiac monitoring -Continue to trend troponins.  We will get D-dimer in view of history of DVT and filter placement(low suspicion) -Neurologic checks -IV hydration  Suspect AKI (acute kidney injury) ([place]) -Suspecting AKI.  Creatinine 1.54.  Baseline 18 months prior was 0.97 -Hydrate and monitor renal function and avoid nephrotoxins    Hypertension -Hold home hydrochlorothiazide for now    Carotid arterial disease (HCC) Coronary artery disease PAD -Continue clopidogrel and atorvastatin    History of DVT (deep vein thrombosis) -History of IVC filter -Follow D-dimer    Long term current use of opiate analgesic   Chronic low back pain    Chronic pain syndrome -We will continue home oxycodone  Insomnia -Patient takes trazodone 150 nightly as needed -We will decrease dose to 15 for tonight        DVT prophylaxis: Lovenox  Code Status: full code  Family Communication:  none  Disposition Plan: Back to previous home environment Consults called: none  Status: Observation    Athena Masse MD [facility]     12/12/2019, 10:13 PM

## 2019-12-12 NOTE — ED Provider Notes (Signed)
[facility] Emergency Department Provider Note    First MD Initiated Contact with Patient 12/12/19 1929     (approximate)  I have reviewed the triage vital signs and the nursing notes.   HISTORY  Chief Complaint Loss of Consciousness    HPI Paublo Warshawsky III is a 74 y.o. male below listed past medical history presents to the ER for evaluation of syncopal episode that occurred today.  Has had multiple of these episodes as of late.  This is the first 1 that was witnessed by his wife.  Was feeling otherwise well today.  Does endorse some decreased p.o. intake but when going to give her a hug had episode where he completely lost consciousness fell and hit his head.  He is on blood thinners.  Got extensive history of PAD.  Denies any palpitations or chest pain or pressure.   No witnessed seizure-like activity.  He does continue to smoke.   Past Medical History:  Diagnosis Date  . Aortic aneurysm ([place])   . Coronary artery disease   . DVT (deep venous thrombosis) ([place])    H/O DVT in left leg, filter placed approximatly 8 yrs ago  . H/O spinal fusion 1998   Back pain  . Hypertension    controlled on meds  . Personal history of tobacco use, presenting hazards to health 06/12/2015  . Personal history of tobacco use, presenting hazards to health 06/12/2015  . Prostate cancer [facility])    Had prostatectomy  . Wears dentures    upper   Family History  Problem Relation Age of Onset  . Heart attack Mother   . Congestive Heart Failure Father   . Cerebral palsy Son    Past Surgical History:  Procedure Laterality Date  . BACK SURGERY  1998   Lumbar spine fusion  . carotid doppler ultrasound  10/05/2011   75-90% RCA occlusion, 50% on left  . CAROTID ENDARTERECTOMY Right 12/02/2011   Dr. Lucky Cowboy  . CHOLECYSTECTOMY  10/02/2010   Laparoscopic, Dr. Pat , [facility]  . COLONOSCOPY WITH PROPOFOL N/A 06/20/2017   Procedure: COLONOSCOPY WITH PROPOFOL;  Surgeon: [name], MD;   Location: [place];  Service: Endoscopy;  Laterality: N/A;  . 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 Cholelithiasis and acute cholecystiis. Mild enlargement of left adrenal gland  . ENDOVASCULAR REPAIR/STENT GRAFT N/A 03/29/2018   Procedure: ENDOVASCULAR REPAIR/STENT GRAFT;  Surgeon: Algernon Huxley, MD;  Location: [place] [facility];  Service: Cardiovascular;  Laterality: N/A;  . LUMBAR SPINE HARDWARE REMOVAL  07/30/2016   [name], MD  . PROSTATE SURGERY  10/19/2012   prostatectomy. [facility]; Laser assisted, Done by [name] for prostate cancer   Patient Active Problem List   Diagnosis Date Noted  . Syncope and collapse 12/12/2019  . AKI (acute kidney injury) ([place]) 12/12/2019  . 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 Positive colorectal cancer screening using Cologuard test   . Coronary artery disease 07/02/2016  . Neuropathic pain 03/30/2016  . Urinary hesitancy 01/01/2016  . H/O prostatectomy 01/01/2016  . AAA (abdominal aortic aneurysm) without rupture ([place]) 12/17/2015  . Hypokalemia 12/10/2015  . Hypomagnesemia 12/10/2015  . Chronic sacroiliac joint pain (Bilateral) (R>L) 12/10/2015  . Chronic hip pain (Bilateral) (R>L) 12/10/2015  . Chronic pain syndrome 12/10/2015  . Long term current use of opiate analgesic 10/16/2015  . Long term prescription opiate use 10/16/2015  .  Opiate use (90 MME/Day) 10/16/2015  . Encounter for therapeutic drug level monitoring 10/16/2015  . Chronic low back pain (Location of Primary Source of Pain) (Bilateral) (R>L) 10/16/2015  . 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 History of lumbar fusion (L4-S1 posterior hardware) 10/16/2015  . Lumbar spondylosis 10/16/2015  . Lumbar facet syndrome (Bilateral) (R>L) 10/16/2015  . Opioid-induced constipation ([facility]) 06/05/2015  . Cataract of left eye 09/03/2014  . History of DVT (deep vein  thrombosis) 09/03/2014  . History of prostate cancer 09/03/2014  . Hypercholesteremia 07/24/2014  . Hypertension 07/24/2014  . Carotid arterial disease ([place]) 09/02/2010  . Diverticulosis of colon without hemorrhage 07/11/2006  . Insomnia 06/29/2006  . Smoking greater than 30 pack years 02/01/2006  . Arthrodesis status 02/02/1996      Prior to Admission medications   Medication Sig Start Date End Date Taking? Authorizing Provider  acetaminophen (TYLENOL) 325 MG tablet Take 1-2 tablets (325-650 mg total) by mouth every 6 (six) hours as needed for mild pain (or temp >/= 101 F). 03/30/18  Yes Stegmayer, Kimberly A, PA-C  atorvastatin (LIPITOR) 80 MG tablet TAKE 1 TABLET(80 MG) BY MOUTH DAILY 12/11/19  Yes [name], MD  clopidogrel (PLAVIX) 75 MG tablet TAKE 1 TABLET(75 MG) BY MOUTH DAILY Patient taking differently: Take 75 mg by mouth daily.  08/29/19  Yes Kris Hartmann, NP  hydrochlorothiazide (HYDRODIURIL) 25 MG tablet Take 1 tablet (25 mg total) by mouth daily. 08/20/18  Yes [name], MD  lubiprostone (AMITIZA) 24 MCG capsule Take 1 capsule (24 mcg total) by mouth 2 (two) times daily with a meal. 11/27/19  Yes [name], MD  oxyCODONE (ROXICODONE) 15 MG immediate release tablet Take 1 tablet (15 mg total) by mouth 4 (four) times daily as needed for pain. 12/11/19  Yes [name], MD  Sennosides (EX-LAX PO) Take 3-4 tablets by mouth every 3 (three) days as needed (constipation).    Yes [provider]  traZODone (DESYREL) 150 MG tablet TAKE 1 TABLET(150 MG) BY MOUTH AT BEDTIME AS NEEDED 11/27/19  Yes Fisher, [name], MD    Allergies Zolpidem    Social History Social History   Tobacco Use  . Smoking status: Current Every Day Smoker    Packs/day: 0.50    Years: 50.00    Pack years: 25.00    Types: Cigarettes  . Smokeless tobacco: Never Used  Vaping Use  . Vaping Use: Never used  Substance Use Topics  . Alcohol use: No    Alcohol/week: 0.0  standard drinks  . Drug use: No    Review of Systems Patient denies headaches, rhinorrhea, blurry vision, numbness, shortness of breath, chest pain, edema, cough, abdominal pain, nausea, vomiting, diarrhea, dysuria, fevers, rashes or hallucinations unless otherwise stated above in HPI. ____________________________________________   PHYSICAL EXAM:  VITAL SIGNS: Vitals:   12/12/19 2030 12/12/19 2100  BP: (!) 165/83 (!) 150/106  Pulse:  (!) 58  Resp: 17 14  Temp:    SpO2:  99%    Constitutional: Alert and oriented.  Eyes: Conjunctivae are normal.  Head: Atraumatic. Nose: No congestion/rhinnorhea. Mouth/Throat: Mucous membranes are moist.   Neck: No stridor. Painless ROM.  Cardiovascular: Normal rate, regular rhythm. Grossly normal heart sounds.  Good peripheral circulation. Respiratory: Normal respiratory effort.  No retractions. Lungs CTAB. Gastrointestinal: Soft and nontender. No distention. No abdominal bruits. No CVA tenderness. Genitourinary:  Musculoskeletal: No lower extremity tenderness nor edema.  No joint effusions. Neurologic:  Normal speech  and language. No gross focal neurologic deficits are appreciated. No facial droop Skin:  Skin is warm, dry and intact. No rash noted. Psychiatric: Mood and affect are normal. Speech and behavior are normal.  ____________________________________________   LABS (all labs ordered are listed, but only abnormal results are displayed)  Results for orders placed or performed during the hospital encounter of 12/12/19 (from the past 24 hour(s))  Basic metabolic panel     Status: Abnormal   Collection Time: 12/12/19  4:21 PM  Result Value Ref Range   Sodium 137 135 - 145 mmol/L   Potassium 3.8 3.5 - 5.1 mmol/L   Chloride 100 98 - 111 mmol/L   CO2 29 22 - 32 mmol/L   Glucose, Bld 120 (H) 70 - 99 mg/dL   BUN 23 8 - 23 mg/dL   Creatinine, Ser 1.54 (H) 0.61 - 1.24 mg/dL   Calcium 8.8 (L) 8.9 - 10.3 mg/dL   GFR, Estimated 47 (L) >60  mL/min   Anion gap 8 5 - 15  CBC     Status: Abnormal   Collection Time: 12/12/19  4:21 PM  Result Value Ref Range   WBC 7.9 4.0 - 10.5 K/uL   RBC 4.07 (L) 4.22 - 5.81 MIL/uL   Hemoglobin 13.1 13.0 - 17.0 g/dL   HCT 38.7 (L) 39 - 52 %   MCV 95.1 80.0 - 100.0 fL   MCH 32.2 26.0 - 34.0 pg   MCHC 33.9 30.0 - 36.0 g/dL   RDW 12.9 11.5 - 15.5 %   Platelets 165 150 - 400 K/uL   nRBC 0.0 0.0 - 0.2 %  Troponin I (High Sensitivity)     Status: None   Collection Time: 12/12/19  7:38 PM  Result Value Ref Range   Troponin I (High Sensitivity) 15 <18 ng/L  Respiratory Panel by RT PCR (Flu A&B, Covid) - Nasopharyngeal Swab     Status: None   Collection Time: 12/12/19  8:18 PM   Specimen: Nasopharyngeal Swab  Result Value Ref Range   SARS Coronavirus 2 by RT PCR NEGATIVE NEGATIVE   Influenza A by PCR NEGATIVE NEGATIVE   Influenza B by PCR NEGATIVE NEGATIVE  CBC     Status: Abnormal   Collection Time: 12/12/19 10:21 PM  Result Value Ref Range   WBC 8.0 4.0 - 10.5 K/uL   RBC 4.12 (L) 4.22 - 5.81 MIL/uL   Hemoglobin 13.5 13.0 - 17.0 g/dL   HCT 39.7 39 - 52 %   MCV 96.4 80.0 - 100.0 fL   MCH 32.8 26.0 - 34.0 pg   MCHC 34.0 30.0 - 36.0 g/dL   RDW 12.9 11.5 - 15.5 %   Platelets 157 150 - 400 K/uL   nRBC 0.0 0.0 - 0.2 %  Creatinine, serum     Status: Abnormal   Collection Time: 12/12/19 10:21 PM  Result Value Ref Range   Creatinine, Ser 1.51 (H) 0.61 - 1.24 mg/dL   GFR, Estimated 48 (L) >60 mL/min   ____________________________________________  EKG My review and personal interpretation at Time: 16:24   Indication: syncop  Rate: 85  Rhythm: sinus Axis: normal Other: normal intervals, no stemi ____________________________________________  RADIOLOGY  I personally reviewed all radiographic images ordered to evaluate for the above acute complaints and reviewed radiology reports and findings.  These findings were personally discussed with the patient.  Please see medical record for  radiology report.  ____________________________________________   PROCEDURES  Procedure(s) performed:  Procedures  Critical Care performed: no ____________________________________________   INITIAL IMPRESSION / ASSESSMENT AND PLAN / ED COURSE  Pertinent labs & imaging results that were available during my care of the patient were reviewed by me and considered in my medical decision making (see chart for details).   DDX: Dysrhythmia, electrolyte abnormality, orthostasis, anemia, ACS, seizure, IPH  Desmon Hitchner III is a 74 y.o. who presents to the ED with multiple syncopal episodes as described above.  He is nontoxic-appearing but his presentation is concerning and the patient does have extensive risk factors with severe PVD and still smoking.  His troponin is negative.  Initial EKG with no evidence of acute ischemia or preexcitation syndrome.  CT head is negative.  Has no focal neuro deficits.  Have a lower suspicion for seizure.  He is on chronic pain medication which could be contributing the presentation may have a component of dehydration will give IV fluids but based on his age and risk factors I do think that he would benefit from hospitalization observation overnight.  I discussed case with hospitalist, Dr. Damita Dunnings, who has kindly agreed to admit patient for further management.     The patient was evaluated in Emergency Department today for the symptoms described in the history of present illness. He/she was evaluated in the context of the global COVID-19 pandemic, which necessitated consideration that the patient might be at risk for infection with the SARS-CoV-2 virus that causes COVID-19. Institutional protocols and algorithms that pertain to the evaluation of patients at risk for COVID-19 are in a state of rapid change based on information released by regulatory bodies including the CDC and federal and state organizations. These policies and algorithms were followed during the  patient's care in the ED.  As part of my medical decision making, I reviewed the following data within the [place] notes reviewed and incorporated, Labs reviewed, notes from prior ED visits and [place] Controlled Substance Database   ____________________________________________   FINAL CLINICAL IMPRESSION(S) / ED DIAGNOSES  Final diagnoses:  Syncope and collapse      NEW MEDICATIONS STARTED DURING THIS VISIT:  New Prescriptions   No medications on file     Note:  This document was prepared using Dragon voice recognition software and may include unintentional dictation errors.    Merlyn Lot, MD 12/12/19 [phone]

## 2019-12-12 NOTE — ED Triage Notes (Signed)
Pt comes into the ED via POV c/o syncopal episodes with the last one being 90 minutes ago.  Pt states this has been an ongoing problem for a while with no diagnoses to be given.  Pt states he can feel when he is going to pass out.  Pt currently neurologically intact at this time. Pt states he did hit the back of his head this time.  Pt states there is a hematoma present.  Pt does take a blood thinner.

## 2019-12-12 NOTE — ED Notes (Signed)
Food tray given to Patient and wife

## 2019-12-12 NOTE — ED Notes (Signed)
ED Provider at bedside. 

## 2019-12-12 NOTE — ED Triage Notes (Signed)
First Nurse Note:  Arrives with c/o frequent falls.  Patient placed in [place].  Wife to stay with patient for safety.

## 2019-12-13 ENCOUNTER — Ambulatory Visit: Admission: RE | Admit: 2019-12-13 | Payer: PPO | Source: Ambulatory Visit

## 2019-12-13 ENCOUNTER — Telehealth: Payer: Self-pay

## 2019-12-13 DIAGNOSIS — I2510 Atherosclerotic heart disease of native coronary artery without angina pectoris: Secondary | ICD-10-CM

## 2019-12-13 DIAGNOSIS — M545 Low back pain, unspecified: Secondary | ICD-10-CM | POA: Diagnosis not present

## 2019-12-13 DIAGNOSIS — G894 Chronic pain syndrome: Secondary | ICD-10-CM

## 2019-12-13 DIAGNOSIS — N1831 Chronic kidney disease, stage 3a: Secondary | ICD-10-CM | POA: Diagnosis not present

## 2019-12-13 DIAGNOSIS — G8929 Other chronic pain: Secondary | ICD-10-CM

## 2019-12-13 DIAGNOSIS — R55 Syncope and collapse: Secondary | ICD-10-CM | POA: Diagnosis not present

## 2019-12-13 LAB — URINALYSIS, COMPLETE (UACMP) WITH MICROSCOPIC
Bacteria, UA: NONE SEEN
Bilirubin Urine: NEGATIVE
Glucose, UA: NEGATIVE mg/dL
Ketones, ur: NEGATIVE mg/dL
Leukocytes,Ua: NEGATIVE
Nitrite: NEGATIVE
Protein, ur: NEGATIVE mg/dL
Specific Gravity, Urine: 1.018 (ref 1.005–1.030)
Squamous Epithelial / HPF: NONE SEEN (ref 0–5)
pH: 5 (ref 5.0–8.0)

## 2019-12-13 LAB — CBC
HCT: 35.5 % — ABNORMAL LOW (ref 39.0–52.0)
Hemoglobin: 12 g/dL — ABNORMAL LOW (ref 13.0–17.0)
MCH: 32.4 pg (ref 26.0–34.0)
MCHC: 33.8 g/dL (ref 30.0–36.0)
MCV: 95.9 fL (ref 80.0–100.0)
Platelets: 148 10*3/uL — ABNORMAL LOW (ref 150–400)
RBC: 3.7 MIL/uL — ABNORMAL LOW (ref 4.22–5.81)
RDW: 12.9 % (ref 11.5–15.5)
WBC: 7.2 10*3/uL (ref 4.0–10.5)
nRBC: 0 % (ref 0.0–0.2)

## 2019-12-13 LAB — BASIC METABOLIC PANEL
Anion gap: 6 (ref 5–15)
BUN: 22 mg/dL (ref 8–23)
CO2: 31 mmol/L (ref 22–32)
Calcium: 8.3 mg/dL — ABNORMAL LOW (ref 8.9–10.3)
Chloride: 101 mmol/L (ref 98–111)
Creatinine, Ser: 1.35 mg/dL — ABNORMAL HIGH (ref 0.61–1.24)
GFR, Estimated: 55 mL/min — ABNORMAL LOW (ref >60)
Glucose, Bld: 83 mg/dL (ref 70–99)
Potassium: 3.6 mmol/L (ref 3.5–5.1)
Sodium: 138 mmol/L (ref 135–145)

## 2019-12-13 NOTE — Telephone Encounter (Signed)
Copied from [place] [id number]. Topic: General - Inquiry >> Dec 13, 2019 10:11 AM Greggory Keen D wrote: Reason for CRM: Pt called saying he went to the ER yesterday because he passed out at home.  He left this ER this morning because he said he was feeling better and didn't want to be in the ER any longer.  He said he was going to call the ER to see if they uggest he comes back in.  He just wanted to call and let Dr. Caryn Section know.  CB# [phone]

## 2019-12-13 NOTE — Discharge Summary (Signed)
Physician Discharge Summary  Justin Harrington FGH:[id number] DOB: 1945-06-22 DOA: 12/12/2019  PCP: [name], MD  Admit date: 12/12/2019 Discharge date: 12/13/2019  Admitted From: Home Disposition: Left AGAINST MEDICAL ADVICE  Recommendations for Outpatient Follow-up:  1. Follow ups as below. 2. Please obtain CBC/BMP/Mag at follow up 3. Please follow up on the following pending results: None  Discharge Condition: Stable but risk for syncope CODE STATUS: Full code    HPI: Per Dr. Judd Gaudier 74 y.o. 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  He apparently went to help his wife and passed out falling to the floor hitting his head.  Patient was previously in his usual state of health.  Patient reports that he has had for 5 of these episodes over the past year but this is the first time it was witnessed.  States they are usually brief and he gets up and he says normal self afterwards.  Has spoken to his PCP about it but has never had a work-up.  Says he could usually feel it coming on so he will sit down and we will prevent him from falling.  He denies recent illness.  Denied fever or chills, cough or shortness of breath, previous nausea or vomiting or change in bowel habits or abdominal pain.  Denied headache or visual disturbance or one-sided numbness weakness or tingling.  Denied chest pains or palpitations.  Syncopal episode was brief and there were no seizure-like activity and no urinary incontinence. ED Course: On arrival in the emergency room, patient was awake and alert.  Vital signs were within normal limits.  Blood work was  at baseline.creatinine was 1.54, similar to creatinine over past several months but 18 months prior creatinine was as low as 0.97.  Troponin 15. EKG as reviewed by me : NSR rate of 84 with incomplete RBBB and no acute ST-T wave changes Chest x-ray clear.  CT head with no acute findings Patient received an IV fluid bolus in the ER.  Hospitalist consulted for admission  Hospital Course: Briefly, 74 year old male with history of chronic back pain, CAD/PAD, CKD-3, tobacco abuse, carotid artery stenosis, DVT /p IVC and prostate cancer s/p prostatectomy resented to ED with what looks like a recurrent syncope, and admitted for observation overnight.  Work-up in ED including basic labs, EKG, serial troponin, CXR and CT head significant finding.  He was admitted for further evaluation and monitoring.  However, patient decided to leave [place] the next morning before even got a chance to evaluate him.  He definitely need an event monitor given his recurrent syncope.  Discharge Diagnoses:  Syncope and collapse-since recurrent based on history.  Work-up unrevealing.  D-dimer elevated but unreliable given history of DVT.  He has no tachycardia, tachypnea or oxygen requirement to suggest large PE that could cause syncope.  However, patient needs an event monitor given recurrence.   CAD/PAD/essential HTN -Continue home medications  CKD-3A: Creatinine about baseline. History of DVT (deep vein thrombosis) s/p IVC filter.   Long term current use of opiate analgesic Chronic low back pain  Chronic pain syndrome -Continue home medications  Insomnia -Continue home medications   Body mass index  is 20.45 kg/m.            Discharge Exam: Vitals:   12/13/19 0600 12/13/19 0700  BP: (!) 157/107 (!) 151/82  Pulse: 66   Resp: 14 13  Temp:    SpO2: 97% 100%    Patient left AGAINST MEDICAL ADVICE before I got a chance to evaluate him  Discharge Instructions   Allergies as of  12/13/2019      Reactions   Zolpidem    Other reaction(s): Other (See Comments) GOT UP IN MIDDLE OF NIGHT AND FILLED ALL THE GLASSES WITH FLOUR      Medication List    ASK your doctor about these medications   acetaminophen 325 MG tablet Commonly known as: TYLENOL Take 1-2 tablets (325-650 mg total) by mouth every 6 (six) hours as needed for mild pain (or temp >/= 101 F).   atorvastatin 80 MG tablet Commonly known as: LIPITOR TAKE 1 TABLET(80 MG) BY MOUTH DAILY   clopidogrel 75 MG tablet Commonly known as: PLAVIX TAKE 1 TABLET(75 MG) BY MOUTH DAILY   EX-LAX PO Take 3-4 tablets by mouth every 3 (three) days as needed (constipation).   hydrochlorothiazide 25 MG tablet Commonly known as: HYDRODIURIL Take 1 tablet (25 mg total) by mouth daily.   lubiprostone 24 MCG capsule Commonly known as: Amitiza Take 1 capsule (24 mcg total) by mouth 2 (two) times daily with a meal.   oxyCODONE 15 MG immediate release tablet Commonly known as: ROXICODONE Take 1 tablet (15 mg total) by mouth 4 (four) times daily as needed for pain.   traZODone 150 MG tablet Commonly known as: DESYREL TAKE 1 TABLET(150 MG) BY MOUTH AT BEDTIME AS NEEDED       Consultations:  None  Procedures/Studies:   CT HEAD WO CONTRAST  Result Date: 12/12/2019 CLINICAL DATA:  Head trauma. EXAM: CT HEAD WITHOUT CONTRAST TECHNIQUE: Contiguous axial images were obtained from the base of the skull through the vertex without intravenous contrast. COMPARISON:  None. FINDINGS: Brain: No evidence of acute large vascular territory infarction, hemorrhage, hydrocephalus, extra-axial collection or mass lesion/mass effect. Vascular: Calcific atherosclerosis. Skull: No acute fracture. Sinuses/Orbits: Scattered mucosal thickening. Retention cyst in the right sphenoid sinus. Unremarkable orbits. Other: No mastoid effusions. IMPRESSION: No evidence of acute intracranial abnormality. Electronically Signed   By: Margaretha Sheffield  MD   On: 12/12/2019 17:06   DG Chest Portable 1 View  Result Date: 12/12/2019 CLINICAL DATA:  74 year old male with syncope. EXAM: PORTABLE CHEST 1 VIEW COMPARISON:  Chest radiograph dated 08/16/2008 and CT dated 08/20/2019. FINDINGS: Emphysema and mild chronic interstitial coarsening. No focal consolidation, pleural effusion, pneumothorax. A 7 mm nodular density over the right lower lung field, likely nipple shadow. The cardiac silhouette is within limits. Atherosclerotic calcification of the aorta. No acute osseous pathology. IMPRESSION: 1. No acute cardiopulmonary process. 2. Aortic Atherosclerosis (ICD10-I70.0) and Emphysema (ICD10-J43.9). Electronically Signed   By: Anner Crete M.D.   On: 12/12/2019 20:19        The results of significant diagnostics from this hospitalization (including imaging, microbiology, ancillary and laboratory) are listed below for reference.     Microbiology: Recent Results (from the past 240 hour(s))  Respiratory Panel by RT PCR (Flu A&B, Covid) - Nasopharyngeal Swab     Status: None   Collection Time: 12/12/19  8:18 PM   Specimen: Nasopharyngeal Swab  Result Value Ref Range Status   SARS Coronavirus 2 by RT PCR NEGATIVE NEGATIVE Final    Comment: (NOTE)  SARS-CoV-2 target nucleic acids are NOT DETECTED.  The SARS-CoV-2 RNA is generally detectable in upper respiratoy specimens during the acute phase of infection. The lowest concentration of SARS-CoV-2 viral copies this assay can detect is 131 copies/mL. A negative result does not preclude SARS-Cov-2 infection and should not be used as the sole basis for treatment or other patient management decisions. A negative result may occur with  improper specimen collection/handling, submission of specimen other than nasopharyngeal swab, presence of viral mutation(s) within the areas targeted by this assay, and inadequate number of viral copies (<131 copies/mL). A negative result must be combined with  clinical observations, patient history, and epidemiological information. The expected result is Negative.  Fact Sheet for Patients:  PinkCheek.be  Fact Sheet for Healthcare Providers:  GravelBags.it  This test is no t yet approved or cleared by the [place] FDA and  has been authorized for detection and/or diagnosis of SARS-CoV-2 by FDA under an Emergency Use Authorization (EUA). This EUA will remain  in effect (meaning this test can be used) for the duration of the COVID-19 declaration under Section 564(b)(1) of the Act, 21 U.S.C. section 360bbb-3(b)(1), unless the authorization is terminated or revoked sooner.     Influenza A by PCR NEGATIVE NEGATIVE Final   Influenza B by PCR NEGATIVE NEGATIVE Final    Comment: (NOTE) The Xpert Xpress SARS-CoV-2/FLU/RSV assay is intended as an aid in  the diagnosis of influenza from Nasopharyngeal swab specimens and  should not be used as a sole basis for treatment. Nasal washings and  aspirates are unacceptable for Xpert Xpress SARS-CoV-2/FLU/RSV  testing.  Fact Sheet for Patients: PinkCheek.be  Fact Sheet for Healthcare Providers: GravelBags.it  This test is not yet approved or cleared by the [place] FDA and  has been authorized for detection and/or diagnosis of SARS-CoV-2 by  FDA under an Emergency Use Authorization (EUA). This EUA will remain  in effect (meaning this test can be used) for the duration of the  Covid-19 declaration under Section 564(b)(1) of the Act, 21  U.S.C. section 360bbb-3(b)(1), unless the authorization is  terminated or revoked. Performed at [facility], [address] [place], [place] [address]      Labs: BNP (last 3 results) No results for input(s): BNP in the last 8760 hours. Basic Metabolic Panel: Recent Labs  Lab 12/12/19 1621 12/12/19 2221 12/13/19 0338  NA  137  --  138  K 3.8  --  3.6  CL 100  --  101  CO2 29  --  31  GLUCOSE 120*  --  83  BUN 23  --  22  CREATININE 1.54* 1.51* 1.35*  CALCIUM 8.8*  --  8.3*   Liver Function Tests: No results for input(s): AST, ALT, ALKPHOS, BILITOT, PROT, ALBUMIN in the last 168 hours. No results for input(s): LIPASE, AMYLASE in the last 168 hours. No results for input(s): AMMONIA in the last 168 hours. CBC: Recent Labs  Lab 12/12/19 1621 12/12/19 2221 12/13/19 0338  WBC 7.9 8.0 7.2  HGB 13.1 13.5 12.0*  HCT 38.7* 39.7 35.5*  MCV 95.1 96.4 95.9  PLT 165 157 148*   Cardiac Enzymes: No results for input(s): CKTOTAL, CKMB, CKMBINDEX, TROPONINI in the last 168 hours. BNP: Invalid input(s): POCBNP CBG: No results for input(s): GLUCAP in the last 168 hours. D-Dimer No results for input(s): DDIMER in the last 72 hours. Hgb A1c No results for input(s): HGBA1C in the last 72 hours. Lipid Profile No results for input(s):  CHOL, HDL, LDLCALC, TRIG, CHOLHDL, LDLDIRECT in the last 72 hours. Thyroid function studies No results for input(s): TSH, T4TOTAL, T3FREE, THYROIDAB in the last 72 hours.  Invalid input(s): FREET3 Anemia work up No results for input(s): VITAMINB12, FOLATE, FERRITIN, TIBC, IRON, RETICCTPCT in the last 72 hours. Urinalysis    Component Value Date/Time   COLORURINE YELLOW (A) 12/12/2019 2221   APPEARANCEUR CLEAR (A) 12/12/2019 2221   LABSPEC 1.018 12/12/2019 2221   PHURINE 5.0 12/12/2019 2221   GLUCOSEU NEGATIVE 12/12/2019 2221   HGBUR SMALL (A) 12/12/2019 2221   BILIRUBINUR NEGATIVE 12/12/2019 2221   BILIRUBINUR Negative 04/07/2018 0808   KETONESUR NEGATIVE 12/12/2019 2221   PROTEINUR NEGATIVE 12/12/2019 2221   UROBILINOGEN 0.2 04/07/2018 0808   NITRITE NEGATIVE 12/12/2019 2221   LEUKOCYTESUR NEGATIVE 12/12/2019 2221   Sepsis Labs Invalid input(s): PROCALCITONIN,  WBC,  LACTICIDVEN   Time coordinating discharge: 20 minutes  SIGNED:  Mercy Riding, MD  [facility] 12/13/2019, 5:23 PM  If 7PM-7AM, please contact night-coverage [email or web address]

## 2019-12-13 NOTE — Telephone Encounter (Signed)
Agree with need for further evaluation at the ER since he had some syncopal episodes yesterday.

## 2019-12-13 NOTE — ED Notes (Signed)
Patient on call bell. Reports he has been observed for long enough and would like to go home. Notified patient that he has a ready inpatient bed, but patient states he would like to go home. Patient called family for ride. MD notified via secure chat. Patient awake alert oriented in NAD.

## 2019-12-13 NOTE — ED Notes (Signed)
Patient left department with spouse. Patient last observed awake alert oriented ambulatory in NAD. Patient signed ED AMA form prior to departure. Instructed to follow-up with PCP ASAP and to return for any repeat or worrisome symptoms. Patient left before MD eval or counsel this morning.

## 2019-12-18 ENCOUNTER — Other Ambulatory Visit (INDEPENDENT_AMBULATORY_CARE_PROVIDER_SITE_OTHER): Payer: Self-pay | Admitting: Nurse Practitioner

## 2019-12-18 DIAGNOSIS — Z95820 Peripheral vascular angioplasty status with implants and grafts: Secondary | ICD-10-CM

## 2019-12-18 DIAGNOSIS — I708 Atherosclerosis of other arteries: Secondary | ICD-10-CM

## 2019-12-18 DIAGNOSIS — Z8679 Personal history of other diseases of the circulatory system: Secondary | ICD-10-CM

## 2019-12-18 DIAGNOSIS — I714 Abdominal aortic aneurysm, without rupture, unspecified: Secondary | ICD-10-CM

## 2019-12-19 ENCOUNTER — Other Ambulatory Visit: Payer: Self-pay

## 2019-12-19 ENCOUNTER — Ambulatory Visit (INDEPENDENT_AMBULATORY_CARE_PROVIDER_SITE_OTHER): Payer: PPO | Admitting: Nurse Practitioner

## 2019-12-19 ENCOUNTER — Encounter (INDEPENDENT_AMBULATORY_CARE_PROVIDER_SITE_OTHER): Payer: Self-pay | Admitting: Nurse Practitioner

## 2019-12-19 ENCOUNTER — Other Ambulatory Visit (INDEPENDENT_AMBULATORY_CARE_PROVIDER_SITE_OTHER): Payer: PPO

## 2019-12-19 ENCOUNTER — Ambulatory Visit (INDEPENDENT_AMBULATORY_CARE_PROVIDER_SITE_OTHER): Payer: PPO

## 2019-12-19 VITALS — BP 146/73 | HR 89 | Resp 16 | Ht 73.0 in | Wt 153.0 lb

## 2019-12-19 DIAGNOSIS — I708 Atherosclerosis of other arteries: Secondary | ICD-10-CM

## 2019-12-19 DIAGNOSIS — I6523 Occlusion and stenosis of bilateral carotid arteries: Secondary | ICD-10-CM | POA: Diagnosis not present

## 2019-12-19 DIAGNOSIS — R55 Syncope and collapse: Secondary | ICD-10-CM | POA: Diagnosis not present

## 2019-12-19 DIAGNOSIS — Z9889 Other specified postprocedural states: Secondary | ICD-10-CM | POA: Diagnosis not present

## 2019-12-19 DIAGNOSIS — Z95820 Peripheral vascular angioplasty status with implants and grafts: Secondary | ICD-10-CM

## 2019-12-19 DIAGNOSIS — Z8679 Personal history of other diseases of the circulatory system: Secondary | ICD-10-CM

## 2019-12-19 DIAGNOSIS — I10 Essential (primary) hypertension: Secondary | ICD-10-CM | POA: Diagnosis not present

## 2019-12-19 DIAGNOSIS — I714 Abdominal aortic aneurysm, without rupture, unspecified: Secondary | ICD-10-CM

## 2019-12-19 MED ORDER — CLOPIDOGREL BISULFATE 75 MG PO TABS: 75.0000 mg | ORAL_TABLET | Freq: Every day | ORAL | 4 refills | Status: DC

## 2019-12-21 ENCOUNTER — Other Ambulatory Visit: Payer: Self-pay

## 2019-12-21 ENCOUNTER — Encounter: Payer: Self-pay | Admitting: Family Medicine

## 2019-12-21 ENCOUNTER — Ambulatory Visit (INDEPENDENT_AMBULATORY_CARE_PROVIDER_SITE_OTHER): Payer: PPO | Admitting: Family Medicine

## 2019-12-21 VITALS — BP 155/81 | HR 92 | Temp 97.9°F | Resp 16 | Wt 157.0 lb

## 2019-12-21 DIAGNOSIS — M4696 Unspecified inflammatory spondylopathy, lumbar region: Secondary | ICD-10-CM | POA: Diagnosis not present

## 2019-12-21 DIAGNOSIS — G4700 Insomnia, unspecified: Secondary | ICD-10-CM

## 2019-12-21 DIAGNOSIS — R55 Syncope and collapse: Secondary | ICD-10-CM

## 2019-12-21 DIAGNOSIS — I713 Abdominal aortic aneurysm, ruptured, unspecified: Secondary | ICD-10-CM | POA: Insufficient documentation

## 2019-12-21 MED ORDER — TRAZODONE HCL 150 MG PO TABS: 150.0000 mg | ORAL_TABLET | Freq: Every day | ORAL | Status: DC

## 2019-12-21 NOTE — Progress Notes (Signed)
Established patient visit   Patient: Justin Harrington   DOB: January 16, 1946   74 y.o. Male  MRN: [id number] Visit Date: 12/21/2019  Today's healthcare provider: [name], MD   Chief Complaint  Patient presents with  . ER follow up    Subjective    HPI  Follow up ER visit  Patient was seen in ER for loss of consciousness on 12/12/19. He was treated for syncope. Treatment for this included IVF and overnight observation. Head CT scan was WNL, CXR WNL, and EKG was stable. He was admitted for overnight observation on telemetry with no arrhythmias.     Patient reports that he has not had any more episodes since his ER visit. He has also seen vascular on 12/19/19, and reports that there were no changes in medications. He states that has had about three similar episodes over the last year or two. States they all occur while standing, but not immediately after standing up. Previous episodes were when he was alone. There are no preceding sx, he just states he is fine one minute and wakes up on the floor after losing consciousness.   His daughter is here today and states she was present during the most recent episode on 12/12/2019. They had been having a conversation while he was standing. He gave her a hug and during the hug he suddenly went limp and lost consciousness, falling to the floor. There were no preceding symptoms. He woke about about after 30 or 40 seconds. He felt fine, aside from some fatigue, after regaining consciousness.     Medications: Outpatient Medications Prior to Visit  Medication Sig  . acetaminophen (TYLENOL) 325 MG tablet Take 1-2 tablets (325-650 mg total) by mouth every 6 (six) hours as needed for mild pain (or temp >/= 101 F).  Marland Kitchen atorvastatin (LIPITOR) 80 MG tablet TAKE 1 TABLET(80 MG) BY MOUTH DAILY  . clopidogrel (PLAVIX) 75 MG tablet Take 1 tablet (75 mg total) by mouth daily.  . hydrochlorothiazide (HYDRODIURIL) 25 MG tablet Take 1 tablet (25 mg total)  by mouth daily.  Marland Kitchen lubiprostone (AMITIZA) 24 MCG capsule Take 1 capsule (24 mcg total) by mouth 2 (two) times daily with a meal. (Patient not taking: Reported on 12/19/2019)  . oxyCODONE (ROXICODONE) 15 MG immediate release tablet Take 1 tablet (15 mg total) by mouth 4 (four) times daily as needed for pain.  . Sennosides (EX-LAX PO) Take 3-4 tablets by mouth every 3 (three) days as needed (constipation).   . traZODone (DESYREL) 150 MG tablet TAKE 1 TABLET(150 MG) BY MOUTH AT BEDTIME AS NEEDED   No facility-administered medications prior to visit.    Review of Systems  Constitutional: Negative for activity change and fatigue.  Cardiovascular: Negative for chest pain, palpitations and leg swelling.  Neurological: Negative for dizziness, light-headedness and headaches.  Psychiatric/Behavioral: Negative.       Objective    BP (!) 155/81   Pulse 92   Temp 97.9 F (36.6 C)   Resp 16   Wt 157 lb (71.2 kg)   BMI 20.71 kg/m    Physical Exam   General: Appearance:    Well developed, well nourished male in no acute distress  Eyes:    PERRL, conjunctiva/corneas clear, EOM's intact       Lungs:     Clear to auscultation bilaterally, respirations unlabored  Heart:    Normal heart rate. Normal rhythm. No murmurs, rubs, or gallops.   MS:   All extremities  are intact.   Neurologic:   Awake, alert, oriented x 3. No apparent focal neurological           defect.         Assessment & Plan     1. Syncope and collapse Difficult to work up due to episodes occurring so sparsely. However no apparent arrhythmias with overnight observation. Has unilateral carotid artery stenosis. Possible orthostatic episodes, but history not really consistent with that. May be vasovagal episodes.   - Ambulatory referral to Neurology  2. Insomnia, unspecified type Trazodone working well, but usually needs to take 2 - traZODone (DESYREL) 150 MG tablet; Take 1-2 tablets (150-300 mg total) by mouth at bedtime.   Dispense: 3 tablet  3. Unspecified inflammatory spondylopathy, lumbar region ([place]) Pain is well controlled on currently medications.    Future Appointments  Date Time Provider [place]  04/14/2020  1:40 PM [name], MD [facility]  06/12/2020  9:30 AM [facility] 2 [facility] None  06/12/2020 10:30 AM [facility] None  06/12/2020 11:00 AM [facility] None  06/12/2020 11:30 AM Kris Hartmann, NP [facility] None         The entirety of the information documented in the History of Present Illness, Review of Systems and Physical Exam were personally obtained by me. Portions of this information were initially documented by the CMA and reviewed by me for thoroughness and accuracy.      [name], MD  [facility] [phone] (phone) [phone] (fax)  [place]

## 2019-12-23 ENCOUNTER — Encounter (INDEPENDENT_AMBULATORY_CARE_PROVIDER_SITE_OTHER): Payer: Self-pay | Admitting: Nurse Practitioner

## 2019-12-23 NOTE — Progress Notes (Signed)
Subjective:    Patient ID: Justin Harrington, male    DOB: Dec 03, 1945, 74 y.o.   MRN: [id number] Chief Complaint  Patient presents with  . Follow-up    ultrasound    The patient returns to the office for surveillance of an abdominal aortic aneurysm status post stent graft placement on 03/29/2018.   Patient denies abdominal pain or back pain, no other abdominal complaints. No groin related complaints. No symptoms consistent with distal embolization No changes in claudication distance.   There have been no interval changes in his overall healthcare since his last visit.   Patient denies amaurosis fugax or TIA symptoms. There is no history of claudication or rest pain symptoms of the lower extremities. The patient denies angina or shortness of breath.   Duplex US of the aorta and iliac arteries shows a 4.1 AAA sac with no endoleak, decrease in the sac compared to the previous study.the patient also underwent bilateral ABIs he has an ABI 0.88 on the right and 0.98 on the left.  The patient has biphasic/triphasic waveforms on the right with triphasic tibial artery waveforms on the left.  Patient has slightly dampened T waves bilaterally.    Patient also has a known history of carotid artery stenosis.  He recently had a syncopal episode.  The patient's wife notes that she was helping him and suddenly she fell gripping by her husband and his subsequent collapse.  There was loss of consciousness and patient hit his head during the fall.  Patient was evaluated at the emergency room.  There have been two or three prior episodes over the past several years.  There is no recent history of TIA symptoms or focal motor deficits. There is no prior documented CVA.  The patient was not taking enteric-coated aspirin 81 mg daily at the time.  There is no history of migraine headaches or prior diagnosis of ocular migraine. There is no history of seizures.  The patient has a history of coronary artery  disease, no recent episodes of angina or shortness of breath. The patient denies PAD or claudication symptoms. There is a history of hyperlipidemia which is being treated with a statin.   The patient has a right ICA stenosis consistent with 40 to 59%.  The patient's previous right carotid endarterectomy is patent.  The left ICA is consistent with a 1 to 39% stenosis.  The patient has bilateral vertebral arteries that demonstrate antegrade flow.  Normal flow hemodynamics are seen in the bilateral subclavian arteries.   Review of Systems  Respiratory: Negative for shortness of breath.   Cardiovascular: Negative for leg swelling.  Neurological: Positive for syncope.  All other systems reviewed and are negative.      Objective:   Physical Exam Vitals reviewed.  HENT:     Head: Normocephalic.  Neck:     Vascular: No carotid bruit.  Cardiovascular:     Rate and Rhythm: Normal rate and regular rhythm.     Pulses: Normal pulses.     Heart sounds: Normal heart sounds.  Pulmonary:     Effort: Pulmonary effort is normal.  Skin:    General: Skin is warm and dry.  Neurological:     Mental Status: He is alert and oriented to person, place, and time. Mental status is at baseline.  Psychiatric:        Mood and Affect: Mood normal.        Behavior: Behavior normal.        Thought  Content: Thought content normal.        Judgment: Judgment normal.     BP (!) 146/73 (BP Location: Right Arm)   Pulse 89   Resp 16   Ht 6\' 1"  (1.854 m)   Wt 153 lb (69.4 kg)   BMI 20.19 kg/m   Past Medical History:  Diagnosis Date  . Aortic aneurysm ([place])   . Coronary artery disease   . DVT (deep venous thrombosis) ([place])    H/O DVT in left leg, filter placed approximatly 8 yrs ago  . H/O spinal fusion 1998   Back pain  . Hypertension    controlled on meds  . Personal history of tobacco use, presenting hazards to health 06/12/2015  . Personal history of tobacco use, presenting hazards to health  06/12/2015  . Prostate cancer [facility])    Had prostatectomy  . Wears dentures    upper    Social History   Socioeconomic History  . Marital status: Married    Spouse name: Not on file  . Number of children: 2  . Years of education: HS Diploma  . Highest education level: 12th grade  Occupational History  . Occupation: Retired  Tobacco Use  . Smoking status: Current Every Day Smoker    Packs/day: 0.50    Years: 50.00    Pack years: 25.00    Types: Cigarettes  . Smokeless tobacco: Never Used  Vaping Use  . Vaping Use: Never used  Substance and Sexual Activity  . Alcohol use: No    Alcohol/week: 0.0 standard drinks  . Drug use: No  . Sexual activity: Not on file  Other Topics Concern  . Not on file  Social History Narrative   Son passed at age 39 from cerebral palsy.   Social Determinants of Health   Financial Resource Strain:   . Difficulty of Paying Living Expenses: Not on file  Food Insecurity:   . Worried About Charity fundraiser in the Last Year: Not on file  . Ran Out of Food in the Last Year: Not on file  Transportation Needs:   . Lack of Transportation (Medical): Not on file  . Lack of Transportation (Non-Medical): Not on file  Physical Activity:   . Days of Exercise per Week: Not on file  . Minutes of Exercise per Session: Not on file  Stress:   . Feeling of Stress : Not on file  Social Connections:   . Frequency of Communication with Friends and Family: Not on file  . Frequency of Social Gatherings with Friends and Family: Not on file  . Attends Religious Services: Not on file  . Active Member of Clubs or Organizations: Not on file  . Attends Archivist Meetings: Not on file  . Marital Status: Not on file  Intimate Partner Violence:   . Fear of Current or Ex-Partner: Not on file  . Emotionally Abused: Not on file  . Physically Abused: Not on file  . Sexually Abused: Not on file    Past Surgical History:  Procedure Laterality Date  . BACK  SURGERY  1998   Lumbar spine fusion  . carotid doppler ultrasound  10/05/2011   75-90% RCA occlusion, 50% on left  . CAROTID ENDARTERECTOMY Right 12/02/2011   Dr. Lucky Cowboy  . CHOLECYSTECTOMY  10/02/2010   Laparoscopic, Dr. Pat Patrick, [facility]  . COLONOSCOPY WITH PROPOFOL N/A 06/20/2017   Procedure: COLONOSCOPY WITH PROPOFOL;  Surgeon: [name], MD;  Location: [facility];  Service: Endoscopy;  Laterality: N/A;  . 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 Cholelithiasis and acute cholecystiis. Mild enlargement of left adrenal gland  . ENDOVASCULAR REPAIR/STENT GRAFT N/A 03/29/2018   Procedure: ENDOVASCULAR REPAIR/STENT GRAFT;  Surgeon: Algernon Huxley, MD;  Location: [place] [facility];  Service: Cardiovascular;  Laterality: N/A;  . LUMBAR SPINE HARDWARE REMOVAL  07/30/2016   [name], MD  . PROSTATE SURGERY  10/19/2012   prostatectomy. [facility] ; Laser assisted, Done by [name] for prostate cancer    Family History  Problem Relation Age of Onset  . Heart attack Mother   . Congestive Heart Failure Father   . Cerebral palsy Son     Allergies  Allergen Reactions  . Zolpidem     Other reaction(s): Other (See Comments) GOT UP IN MIDDLE OF NIGHT AND FILLED ALL THE GLASSES WITH FLOUR    CBC Latest Ref Rng & Units 12/13/2019 12/12/2019 12/12/2019  WBC 4.0 - 10.5 K/uL 7.2 8.0 7.9  Hemoglobin 13.0 - 17.0 g/dL 12.0(L) 13.5 13.1  Hematocrit 39 - 52 % 35.5(L) 39.7 38.7(L)  Platelets 150 - 400 K/uL 148(L) 157 165      CMP     Component Value Date/Time   NA 138 12/13/2019 0338   NA 141 11/27/2019 1145   NA 144 12/03/2011 0613   K 3.6 12/13/2019 0338   K 3.5 12/03/2011 0613   CL 101 12/13/2019 0338   CL 108 (H) 12/03/2011 0613   CO2 31 12/13/2019 0338   CO2 27 12/03/2011 0613   GLUCOSE 83 12/13/2019 0338   GLUCOSE 94 12/03/2011 0613   BUN 22 12/13/2019 0338   BUN 18 11/27/2019 1145   BUN 9  12/03/2011 0613   CREATININE 1.35 (H) 12/13/2019 0338   CREATININE 0.83 12/03/2011 0613   CALCIUM 8.3 (L) 12/13/2019 0338   CALCIUM 8.7 12/03/2011 0613   PROT 6.6 11/27/2019 1145   ALBUMIN 4.3 11/27/2019 1145   AST 17 11/27/2019 1145   ALT 10 11/27/2019 1145   ALKPHOS 64 11/27/2019 1145   BILITOT 0.4 11/27/2019 1145   GFRNONAA 55 (L) 12/13/2019 0338   GFRNONAA >60 12/03/2011 0613   GFRAA 56 (L) 11/27/2019 1145   GFRAA >60 12/03/2011 0613     VAS [place] ABI WITH/WO TBI  Result Date: 12/21/2019 LOWER EXTREMITY DOPPLER STUDY Indications: Peripheral artery disease, and Bilateral leg pain.  Comparison Study: Performing Technologist: Charlane Ferretti RT (R)(VS)  Examination Guidelines: A complete evaluation includes at minimum, Doppler waveform signals and systolic blood pressure reading at the level of bilateral brachial, anterior tibial, and posterior tibial arteries, when vessel segments are accessible. Bilateral testing is considered an integral part of a complete examination. Photoelectric Plethysmograph (PPG) waveforms and toe systolic pressure readings are included as required and additional duplex testing as needed. Limited examinations for reoccurring indications may be performed as noted.  ABI Findings: +---------+------------------+-----+---------+--------+ Right    Rt Pressure (mmHg)IndexWaveform Comment  +---------+------------------+-----+---------+--------+ Brachial 136                                      +---------+------------------+-----+---------+--------+ ATA      113               0.82 biphasic          +---------+------------------+-----+---------+--------+ PTA      120  0.88 triphasic         +---------+------------------+-----+---------+--------+ Great Toe145               1.06 Dampened          +---------+------------------+-----+---------+--------+ +---------+------------------+-----+---------+-------+ Left     Lt Pressure  (mmHg)IndexWaveform Comment +---------+------------------+-----+---------+-------+ Brachial 137                                     +---------+------------------+-----+---------+-------+ ATA      134               0.98 triphasic        +---------+------------------+-----+---------+-------+ PTA      116               0.85 triphasic        +---------+------------------+-----+---------+-------+ Great Toe118               0.86 Normal           +---------+------------------+-----+---------+-------+ Summary: Right: Resting right ankle-brachial index indicates mild right lower extremity arterial disease. The right toe-brachial index is normal. Left: Resting left ankle-brachial index is within normal range. No evidence of significant left lower extremity arterial disease. The left toe-brachial index is normal.  *See table(s) above for measurements and observations.  Electronically signed by Leotis Pain MD on 12/21/2019 at 9:32:41 AM.   Final        Assessment & Plan:   1. Bilateral carotid artery stenosis Recommend:  Given the patient's asymptomatic subcritical stenosis no further invasive testing or surgery at this time.  Duplex ultrasound shows <50% stenosis bilaterally.  Continue antiplatelet therapy as prescribed Continue management of CAD, HTN and Hyperlipidemia Healthy heart diet,  encouraged exercise at least 4 times per week Follow up in 12 months with duplex ultrasound and physical exam  - VAS US CAROTID; Future - clopidogrel (PLAVIX) 75 MG tablet; Take 1 tablet (75 mg total) by mouth daily.  Dispense: 90 tablet; Refill: 4  2. Abdominal aortic aneurysm (AAA) without rupture (HCC) Recommend: Patient is status post successful endovascular repair of the AAA.   No further intervention is required at this time.   No endoleak is detected and the aneurysm sac is stable.  The patient will continue antiplatelet therapy as prescribed as well as aggressive management of  hyperlipidemia. Exercise is again strongly encouraged.   However, endografts require continued surveillance with ultrasound or CT scan. This is mandatory to detect any changes that allow repressurization of the aneurysm sac.  The patient is informed that this would be asymptomatic.  The patient is reminded that lifelong routine surveillance is a necessity with an endograft. Patient will continue to follow-up at 12 month intervals with ultrasound of the aorta.  3. Atherosclerosis of iliac artery  Recommend:  The patient has evidence of atherosclerosis of the lower extremities with claudication.  The patient does not voice lifestyle limiting changes at this point in time.  Noninvasive studies do not suggest clinically significant change.  No invasive studies, angiography or surgery at this time The patient should continue walking and begin a more formal exercise program.  The patient should continue antiplatelet therapy and aggressive treatment of the lipid abnormalities  No changes in the patient's medications at this time  The patient should continue wearing graduated compression socks 10-15 mmHg strength to control the mild edema.    4. Primary hypertension Continue antihypertensive medications as already ordered, these  medications have been reviewed and there are no changes at this time.   5. Syncope and collapse Based on the noninvasive test today there are no obvious vascular abnormalities which would account for the patient's recent syncope and collapse.  The patient has an upcoming visit with neurology as well as his primary care provider.  We will provide further work-up of the patient's syncope.   Current Outpatient Medications on File Prior to Visit  Medication Sig Dispense Refill  . acetaminophen (TYLENOL) 325 MG tablet Take 1-2 tablets (325-650 mg total) by mouth every 6 (six) hours as needed for mild pain (or temp >/= 101 F).    Marland Kitchen atorvastatin (LIPITOR) 80 MG tablet TAKE  1 TABLET(80 MG) BY MOUTH DAILY 90 tablet 4  . hydrochlorothiazide (HYDRODIURIL) 25 MG tablet Take 1 tablet (25 mg total) by mouth daily. 90 tablet 4  . oxyCODONE (ROXICODONE) 15 MG immediate release tablet Take 1 tablet (15 mg total) by mouth 4 (four) times daily as needed for pain. 120 tablet 0  . Sennosides (EX-LAX PO) Take 3-4 tablets by mouth every 3 (three) days as needed (constipation).     . lubiprostone (AMITIZA) 24 MCG capsule Take 1 capsule (24 mcg total) by mouth 2 (two) times daily with a meal. (Patient not taking: Reported on 12/21/2019) 60 capsule 4   No current facility-administered medications on file prior to visit.    There are no Patient Instructions on file for this visit. No follow-ups on file.   Kris Hartmann, NP

## 2020-01-09 ENCOUNTER — Other Ambulatory Visit: Payer: Self-pay | Admitting: Family Medicine

## 2020-01-09 DIAGNOSIS — M5441 Lumbago with sciatica, right side: Secondary | ICD-10-CM

## 2020-01-09 DIAGNOSIS — G8929 Other chronic pain: Secondary | ICD-10-CM

## 2020-01-09 MED ORDER — OXYCODONE HCL 15 MG PO TABS: 15.0000 mg | ORAL_TABLET | Freq: Four times a day (QID) | ORAL | 0 refills | Status: DC | PRN

## 2020-01-09 NOTE — Telephone Encounter (Signed)
Medication Refill - Medication: oxyCODONE (ROXICODONE) 15 MG immediate release tablet     Preferred Pharmacy (with phone number or street name):  [id number] - [place], [place] - [address] [place] [address] AT [address] [place] [address] Phone:  [phone]  Fax:  [phone]       Agent: Please be advised that RX refills may take up to 3 business days. We ask that you follow-up with your pharmacy.  

## 2020-01-09 NOTE — Telephone Encounter (Signed)
Requested medication (s) are due for refill today:  Yes  Requested medication (s) are on the active medication list:  Yes  Future visit scheduled:  Yes  Last Refill: 12/11/19; #120; no refills  Notes to Clinic:  Medication is not delegated.  Requested Prescriptions  Pending Prescriptions Disp Refills   oxyCODONE (ROXICODONE) 15 MG immediate release tablet 120 tablet 0    Sig: Take 1 tablet (15 mg total) by mouth 4 (four) times daily as needed for pain.      Not Delegated - Analgesics:  Opioid Agonists Failed - 01/09/2020 11:11 AM      Failed - This refill cannot be delegated      Failed - Urine Drug Screen completed in last 360 days      Passed - Valid encounter within last 6 months    Recent Outpatient Visits           2 weeks ago Syncope and collapse   [facility] [name], MD   1 month ago Primary hypertension   [facility] [name], MD   1 year ago Annual physical exam   [facility] [name], MD   1 year ago Onychomycosis   [facility] [name], MD   1 year ago Painful urination   [place], Clearnce Sorrel, [place]       Future Appointments             In 3 months Fisher, [name], MD [facility] [place]

## 2020-01-16 ENCOUNTER — Telehealth: Payer: Self-pay | Admitting: Family Medicine

## 2020-01-16 NOTE — Telephone Encounter (Signed)
Copied from [place] [id number]. Topic: Medicare AWV >> Jan 16, 2020  2:41 PM Cher Nakai R wrote: Reason for CRM:   Left message for patient to call back and schedule Medicare Annual Wellness Visit (AWV) in office.   If not able to come in office, please offer to do virtually.   Last AWV 07/04/2018  Please schedule at anytime with [facility] Advisor.  If any questions, please contact me at [phone]

## 2020-01-23 NOTE — Progress Notes (Signed)
Subjective:   Justin Harrington is a 74 y.o. male who presents for Medicare Annual/Subsequent preventive examination.  I connected with Justin Harrington today by telephone and verified that I am speaking with the correct person using two identifiers. Location patient: home Location provider: work Persons participating in the virtual visit: patient, provider.   I discussed the limitations, risks, security and privacy concerns of performing an evaluation and management service by telephone and the availability of in person appointments. I also discussed with the patient that there may be a patient responsible charge related to this service. The patient expressed understanding and verbally consented to this telephonic visit.    Interactive audio and video telecommunications were attempted between this provider and patient, however failed, due to patient having technical difficulties OR patient did not have access to video capability.  We continued and completed visit with audio only.   Review of Systems    N/A  Cardiac Risk Factors include: advanced age (>82men, >77 women);smoking/ tobacco exposure;male gender;hypertension;dyslipidemia     Objective:    Today's Vitals   01/28/20 1346  PainSc: 5    There is no height or weight on file to calculate BMI.  Advanced Directives 01/28/2020 12/12/2019 07/04/2018 03/29/2018 03/29/2018 03/27/2018 06/20/2017  Does Patient Have a Medical Advance Directive? Yes No Yes Yes Yes No No  Type of Estate agent of [place];Living will - Healthcare Power of [place];Living will Healthcare Power of [place];Living will - - -  Does patient want to make changes to medical advance directive? - - - No - Patient declined No - Patient declined - -  Copy of Healthcare Power of Attorney in Chart? No - copy requested - No - copy requested No - copy requested - - -  Would patient like information on creating a medical advance directive? - - - - No -  Patient declined No - Patient declined No - Patient declined    Current Medications (verified) Outpatient Encounter Medications as of 01/28/2020  Medication Sig   acetaminophen (TYLENOL) 325 MG tablet Take 1-2 tablets (325-650 mg total) by mouth every 6 (six) hours as needed for mild pain (or temp >/= 101 F).   atorvastatin (LIPITOR) 80 MG tablet TAKE 1 TABLET(80 MG) BY MOUTH DAILY   clopidogrel (PLAVIX) 75 MG tablet Take 1 tablet (75 mg total) by mouth daily.   hydrochlorothiazide (HYDRODIURIL) 25 MG tablet Take 1 tablet (25 mg total) by mouth daily.   oxyCODONE (ROXICODONE) 15 MG immediate release tablet Take 1 tablet (15 mg total) by mouth 4 (four) times daily as needed for pain.   polyethylene glycol (MIRALAX / GLYCOLAX) 17 g packet Take 17 g by mouth daily as needed.   Sennosides (EX-LAX PO) Take 3-4 tablets by mouth every 3 (three) days as needed (constipation).    traZODone (DESYREL) 150 MG tablet Take 1-2 tablets (150-300 mg total) by mouth at bedtime.   lubiprostone (AMITIZA) 24 MCG capsule Take 1 capsule (24 mcg total) by mouth 2 (two) times daily with a meal. (Patient not taking: Reported on 01/28/2020)   No facility-administered encounter medications on file as of 01/28/2020.    Allergies (verified) Zolpidem   History: Past Medical History:  Diagnosis Date   Aortic aneurysm (HCC)    Coronary artery disease    DVT (deep venous thrombosis) (HCC)    H/O DVT in left leg, filter placed approximatly 8 yrs ago   H/O spinal fusion 1998   Back pain   Hyperlipidemia  Hypertension    controlled on meds   Personal history of tobacco use, presenting hazards to health 06/12/2015   Personal history of tobacco use, presenting hazards to health 06/12/2015   Prostate cancer [facility])    Had prostatectomy   Wears dentures    upper   Past Surgical History:  Procedure Laterality Date   BACK SURGERY  1998   Lumbar spine fusion   carotid doppler ultrasound   10/05/2011   75-90% RCA occlusion, 50% on left   CAROTID ENDARTERECTOMY Right 12/02/2011   Dr. Wyn Quaker   CHOLECYSTECTOMY  10/02/2010   Laparoscopic, Dr. Michela Pitcher, [facility]   COLONOSCOPY WITH PROPOFOL N/A 06/20/2017   Procedure: COLONOSCOPY WITH PROPOFOL;  Surgeon: Midge Minium, MD;  Location: [facility];  Service: Endoscopy;  Laterality: N/A;   CT SCAN  10/02/2010   [facility]; Infrarenal suprailiac abdominal aortic aneurysm maximum dimention of nearly 4cm. Cholelithiasis and acute cholecystiis. Mild enlargement of left adrenal gland   ENDOVASCULAR REPAIR/STENT GRAFT N/A 03/29/2018   Procedure: ENDOVASCULAR REPAIR/STENT GRAFT;  Surgeon: Annice Needy, MD;  Location: [facility] INVASIVE CV LAB;  Service: Cardiovascular;  Laterality: N/A;   LUMBAR SPINE HARDWARE REMOVAL  07/30/2016   Malachy Chamber, MD   PROSTATE SURGERY  10/19/2012   prostatectomy. [facility]; Laser assisted, Done by Dr. Bradly Bienenstock for prostate cancer   Family History  Problem Relation Age of Onset   Heart attack Mother    Congestive Heart Failure Father    Cerebral palsy Son    Social History   Socioeconomic History   Marital status: Married    Spouse name: Not on file   Number of children: 2   Years of education: HS Diploma   Highest education level: 12th grade  Occupational History   Occupation: Retired  Tobacco Use   Smoking status: Current Every Day Smoker    Packs/day: 0.50    Years: 50.00    Pack years: 25.00    Types: Cigarettes   Smokeless tobacco: Never Used  Building services engineer Use: Never used  Substance and Sexual Activity   Alcohol use: No    Alcohol/week: 0.0 standard drinks   Drug use: No   Sexual activity: Not on file  Other Topics Concern   Not on file  Social History Narrative   Son passed at age 56 from cerebral palsy.   Social Determinants of Health   Financial Resource Strain: Low Risk    Difficulty of Paying Living Expenses: Not hard at all  Food Insecurity: No  Food Insecurity   Worried About Programme researcher, broadcasting/film/video in the Last Year: Never true   Ran Out of Food in the Last Year: Never true  Transportation Needs: No Transportation Needs   Lack of Transportation (Medical): No   Lack of Transportation (Non-Medical): No  Physical Activity: Inactive   Days of Exercise per Week: 0 days   Minutes of Exercise per Session: 0 min  Stress: No Stress Concern Present   Feeling of Stress : Not at all  Social Connections: Moderately Integrated   Frequency of Communication with Friends and Family: Three times a week   Frequency of Social Gatherings with Friends and Family: More than three times a week   Attends Religious Services: More than 4 times per year   Active Member of [employer] or Organizations: No   Attends Banker Meetings: Never   Marital Status: Married    Tobacco Counseling Ready to quit: Yes Counseling given: No  Clinical Intake:  Pre-visit preparation completed: Yes  Pain : 0-10 Pain Score: 5  Pain Type: Chronic pain Pain Location: Back Pain Orientation: Right,Lower Pain Descriptors / Indicators: Aching Pain Frequency: Constant Pain Relieving Factors: Takes Oxycodone 15 mg QID for pain.  Pain Relieving Factors: Takes Oxycodone 15 mg QID for pain.  Nutritional Risks: None Diabetes: No  How often do you need to have someone help you when you read instructions, pamphlets, or other written materials from your doctor or pharmacy?: 1 - Never  Diabetic? No  Interpreter Needed?: No  Information entered by :: [facility], LPN   Activities of Daily Living In your present state of health, do you have any difficulty performing the following activities: 01/28/2020 11/27/2019  Hearing? N N  Vision? N N  Difficulty concentrating or making decisions? N N  Walking or climbing stairs? N Y  Dressing or bathing? N N  Doing errands, shopping? N N  Preparing Food and eating ? N -  Using the Toilet? N -  In the past  six months, have you accidently leaked urine? Y -  Comment Occasionally -  Do you have problems with loss of bowel control? N -  Managing your Medications? N -  Managing your Finances? N -  Housekeeping or managing your Housekeeping? N -  Some recent data might be hidden    Patient Care Team: [name], MD as PCP - General (Family Medicine) Lucky Cowboy, Erskine Squibb, MD as Referring Physician (Vascular Surgery) [place], [place] as Consulting Physician (Optometry) Ubaldo Glassing, Javier Docker, MD as Consulting Physician (Cardiology)  Indicate any recent Medical Services you may have received from other than [facility] providers in the past year (date may be approximate).     Assessment:   This is a routine wellness examination for Justin Harrington.  Hearing/Vision screen No exam data present  Dietary issues and exercise activities discussed: Current Exercise Habits: The patient does not participate in regular exercise at present, Exercise limited by: None identified  Goals     DIET - INCREASE WATER INTAKE     Recommend to start drinking 3 glasses of water a day.      Quit Smoking     Recommend to continue efforts to reduce smoking habits until no longer smoking (Smoking Cessation literature attached to AVS).        Depression Screen PHQ 2/9 Scores 11/27/2019 07/04/2018 07/04/2018 04/26/2017 06/07/2016 06/03/2016 04/22/2016  PHQ - 2 Score 0 0 0 0 0 0 0  PHQ- 9 Score 2 2 - - - - -  Exception Documentation - - - - - - -    Fall Risk Fall Risk  01/28/2020 11/27/2019 08/28/2019 07/04/2018 04/26/2017  Falls in the past year? 1 1 0 0 No  Comment - - Emmi Telephone Survey: data to providers prior to load - -  Number falls in past yr: 0 0 - - -  Injury with Fall? 0 0 - - -  Risk Factor Category  - - - - -  Risk for fall due to : - - - - -  Follow up Falls prevention discussed - - - -  Comment - - - - -    FALL RISK PREVENTION PERTAINING TO THE HOME:  Any stairs in or around the home? Yes  If so, are there any  without handrails? No  Home free of loose throw rugs in walkways, pet beds, electrical cords, etc? Yes  Adequate lighting in your home to reduce risk of  falls? Yes   ASSISTIVE DEVICES UTILIZED TO PREVENT FALLS:  Life alert? No  Use of a cane, walker or w/c? Yes  Grab bars in the bathroom? Yes  Shower chair or bench in shower? Yes  Elevated toilet seat or a handicapped toilet? Yes    Cognitive Function: Normal cognitive status assessed by observation by this Nurse Health Advisor. No abnormalities found.          Immunizations Immunization History  Administered Date(s) Administered   Influenza, High Dose Seasonal PF 10/12/2016, 11/03/2017, 09/18/2018, 11/20/2019   Influenza-Unspecified 12/01/2014   Pneumococcal Conjugate-13 05/08/2013   Pneumococcal Polysaccharide-23 10/02/2010    TDAP status: Due, Education has been provided regarding the importance of this vaccine. Advised may receive this vaccine at local pharmacy or Health Dept. Aware to provide a copy of the vaccination record if obtained from local pharmacy or Health Dept. Verbalized acceptance and understanding.  Flu Vaccine status: Up to date  Pneumococcal vaccine status: Due, Education has been provided regarding the importance of this vaccine. Advised may receive this vaccine at local pharmacy or Health Dept. Aware to provide a copy of the vaccination record if obtained from local pharmacy or Health Dept. Verbalized acceptance and understanding.  Covid-19 vaccine status: Completed vaccines  Qualifies for Shingles Vaccine? Yes   Zostavax completed No   Shingrix Completed?: No.    Education has been provided regarding the importance of this vaccine. Patient has been advised to call insurance company to determine out of pocket expense if they have not yet received this vaccine. Advised may also receive vaccine at local pharmacy or Health Dept. Verbalized acceptance and understanding.  Screening Tests Health  Maintenance  Topic Date Due   COVID-19 Vaccine (1) Never done   PNA vac Low Risk Adult (2 of 2 - PPSV23) 10/02/2015   TETANUS/TDAP  01/27/2021 (Originally 09/08/1964)   Fecal DNA (Cologuard)  05/16/2020   INFLUENZA VACCINE  Completed   Hepatitis C Screening  Completed    Health Maintenance  Health Maintenance Due  Topic Date Due   COVID-19 Vaccine (1) Never done   PNA vac Low Risk Adult (2 of 2 - PPSV23) 10/02/2015    Colorectal cancer screening: Type of screening: Cologuard. Completed 05/16/17. Repeat every 3 years  Lung Cancer Screening: (Low Dose CT Chest recommended if Age 83-80 years, 30 pack-year currently smoking OR have quit w/in 15years.) does qualify however had this completed 08/20/19. Repeat yearly.  Additional Screening:  Hepatitis C Screening: Up to date  Vision Screening: Recommended annual ophthalmology exams for early detection of glaucoma and other disorders of the eye. Is the patient up to date with their annual eye exam?  Yes  Who is the provider or what is the name of the office in which the patient attends annual eye exams? [facility] If pt is not established with a provider, would they like to be referred to a provider to establish care? No .   Dental Screening: Recommended annual dental exams for proper oral hygiene  Community Resource Referral / Chronic Care Management: CRR required this visit?  No   CCM required this visit?  No      Plan:     I have personally reviewed and noted the following in the patients chart:    Medical and social history  Use of alcohol, tobacco or illicit drugs   Current medications and supplements  Functional ability and status  Nutritional status  Physical activity  Advanced directives  List of other physicians  Hospitalizations, surgeries, and ER visits in previous 12 months  Vitals  Screenings to include cognitive, depression, and falls  Referrals and appointments  In addition, I have reviewed  and discussed with patient certain preventive protocols, quality metrics, and best practice recommendations. A written personalized care plan for preventive services as well as general preventive health recommendations were provided to patient.     Jenafer [place], [place]   16/11/9602   Nurse Notes: Pt needs a Pneumovax 23 vaccine at next in office apt. Pt has received his Covid vaccines and booster. Requested vaccine card info to update chart.

## 2020-01-28 ENCOUNTER — Ambulatory Visit (INDEPENDENT_AMBULATORY_CARE_PROVIDER_SITE_OTHER): Payer: PPO

## 2020-01-28 ENCOUNTER — Other Ambulatory Visit: Payer: Self-pay

## 2020-01-28 DIAGNOSIS — Z0000 Encounter for general adult medical examination without abnormal findings: Secondary | ICD-10-CM | POA: Diagnosis not present

## 2020-01-28 NOTE — Patient Instructions (Signed)
Mr. Justin Harrington , Thank you for taking time to come for your Medicare Wellness Visit. I appreciate your ongoing commitment to your health goals. Please review the following plan we discussed and let me know if I can assist you in the future.   Screening recommendations/referrals: Colonoscopy: Cologuard up to date, due 05/16/20 Recommended yearly ophthalmology/optometry visit for glaucoma screening and checkup Recommended yearly dental visit for hygiene and checkup  Vaccinations: Influenza vaccine: Done 11/20/19 Pneumococcal vaccine: Due for Pneumovax 23, pt to receive at next in office apt. Tdap vaccine: Currently due, declined receiving. Shingles vaccine: Shingrix discussed. Please contact your pharmacy for coverage information.     Advanced directives: Please bring a copy of your POA (Power of Attorney) and/or Living Will to your next appointment.   Conditions/risks identified: Fall risk preventatives discussed today. Recommend to increase water intake to 6-8 8 oz glasses a day.  Next appointment: 04/14/20 @ 1:40 PM with Dr Sherrie Mustache   Preventive Care 65 Years and Older, Male Preventive care refers to lifestyle choices and visits with your health care provider that can promote health and wellness. What does preventive care include?  A yearly physical exam. This is also called an annual well check.  Dental exams once or twice a year.  Routine eye exams. Ask your health care provider how often you should have your eyes checked.  Personal lifestyle choices, including:  Daily care of your teeth and gums.  Regular physical activity.  Eating a healthy diet.  Avoiding tobacco and drug use.  Limiting alcohol use.  Practicing safe sex.  Taking low doses of aspirin every day.  Taking vitamin and mineral supplements as recommended by your health care provider. What happens during an annual well check? The services and screenings done by your health care provider during your annual well  check will depend on your age, overall health, lifestyle risk factors, and family history of disease. Counseling  Your health care provider may ask you questions about your:  Alcohol use.  Tobacco use.  Drug use.  Emotional well-being.  Home and relationship well-being.  Sexual activity.  Eating habits.  History of falls.  Memory and ability to understand (cognition).  Work and work Astronomer. Screening  You may have the following tests or measurements:  Height, weight, and BMI.  Blood pressure.  Lipid and cholesterol levels. These may be checked every 5 years, or more frequently if you are over 46 years old.  Skin check.  Lung cancer screening. You may have this screening every year starting at age 81 if you have a 30-pack-year history of smoking and currently smoke or have quit within the past 15 years.  Fecal occult blood test (FOBT) of the stool. You may have this test every year starting at age 1.  Flexible sigmoidoscopy or colonoscopy. You may have a sigmoidoscopy every 5 years or a colonoscopy every 10 years starting at age 50.  Prostate cancer screening. Recommendations will vary depending on your family history and other risks.  Hepatitis C blood test.  Hepatitis B blood test.  Sexually transmitted disease (STD) testing.  Diabetes screening. This is done by checking your blood sugar (glucose) after you have not eaten for a while (fasting). You may have this done every 1-3 years.  Abdominal aortic aneurysm (AAA) screening. You may need this if you are a current or former smoker.  Osteoporosis. You may be screened starting at age 39 if you are at high risk. Talk with your health care provider about  your test results, treatment options, and if necessary, the need for more tests. Vaccines  Your health care provider may recommend certain vaccines, such as:  Influenza vaccine. This is recommended every year.  Tetanus, diphtheria, and acellular  pertussis (Tdap, Td) vaccine. You may need a Td booster every 10 years.  Zoster vaccine. You may need this after age 66.  Pneumococcal 13-valent conjugate (PCV13) vaccine. One dose is recommended after age 50.  Pneumococcal polysaccharide (PPSV23) vaccine. One dose is recommended after age 19. Talk to your health care provider about which screenings and vaccines you need and how often you need them. This information is not intended to replace advice given to you by your health care provider. Make sure you discuss any questions you have with your health care provider. Document Released: 02/14/2015 Document Revised: 10/08/2015 Document Reviewed: 11/19/2014 Elsevier Interactive Patient Education  2017 [place] Prevention in the Home Falls can cause injuries. They can happen to people of all ages. There are many things you can do to make your home safe and to help prevent falls. What can I do on the outside of my home?  Regularly fix the edges of walkways and driveways and fix any cracks.  Remove anything that might make you trip as you walk through a door, such as a raised step or threshold.  Trim any bushes or trees on the path to your home.  Use bright outdoor lighting.  Clear any walking paths of anything that might make someone trip, such as rocks or tools.  Regularly check to see if handrails are loose or broken. Make sure that both sides of any steps have handrails.  Any raised decks and porches should have guardrails on the edges.  Have any leaves, snow, or ice cleared regularly.  Use sand or salt on walking paths during winter.  Clean up any spills in your garage right away. This includes oil or grease spills. What can I do in the bathroom?  Use night lights.  Install grab bars by the toilet and in the tub and shower. Do not use towel bars as grab bars.  Use non-skid mats or decals in the tub or shower.  If you need to sit down in the shower, use a plastic,  non-slip stool.  Keep the floor dry. Clean up any water that spills on the floor as soon as it happens.  Remove soap buildup in the tub or shower regularly.  Attach bath mats securely with double-sided non-slip rug tape.  Do not have throw rugs and other things on the floor that can make you trip. What can I do in the bedroom?  Use night lights.  Make sure that you have a light by your bed that is easy to reach.  Do not use any sheets or blankets that are too big for your bed. They should not hang down onto the floor.  Have a firm chair that has side arms. You can use this for support while you get dressed.  Do not have throw rugs and other things on the floor that can make you trip. What can I do in the kitchen?  Clean up any spills right away.  Avoid walking on wet floors.  Keep items that you use a lot in easy-to-reach places.  If you need to reach something above you, use a strong step stool that has a grab bar.  Keep electrical cords out of the way.  Do not use floor polish or wax  that makes floors slippery. If you must use wax, use non-skid floor wax.  Do not have throw rugs and other things on the floor that can make you trip. What can I do with my stairs?  Do not leave any items on the stairs.  Make sure that there are handrails on both sides of the stairs and use them. Fix handrails that are broken or loose. Make sure that handrails are as long as the stairways.  Check any carpeting to make sure that it is firmly attached to the stairs. Fix any carpet that is loose or worn.  Avoid having throw rugs at the top or bottom of the stairs. If you do have throw rugs, attach them to the floor with carpet tape.  Make sure that you have a light switch at the top of the stairs and the bottom of the stairs. If you do not have them, ask someone to add them for you. What else can I do to help prevent falls?  Wear shoes that:  Do not have high heels.  Have rubber  bottoms.  Are comfortable and fit you well.  Are closed at the toe. Do not wear sandals.  If you use a stepladder:  Make sure that it is fully opened. Do not climb a closed stepladder.  Make sure that both sides of the stepladder are locked into place.  Ask someone to hold it for you, if possible.  Clearly mark and make sure that you can see:  Any grab bars or handrails.  First and last steps.  Where the edge of each step is.  Use tools that help you move around (mobility aids) if they are needed. These include:  Canes.  Walkers.  Scooters.  Crutches.  Turn on the lights when you go into a dark area. Replace any light bulbs as soon as they burn out.  Set up your furniture so you have a clear path. Avoid moving your furniture around.  If any of your floors are uneven, fix them.  If there are any pets around you, be aware of where they are.  Review your medicines with your doctor. Some medicines can make you feel dizzy. This can increase your chance of falling. Ask your doctor what other things that you can do to help prevent falls. This information is not intended to replace advice given to you by your health care provider. Make sure you discuss any questions you have with your health care provider. Document Released: 11/14/2008 Document Revised: 06/26/2015 Document Reviewed: 02/22/2014 Elsevier Interactive Patient Education  2017 [employer].

## 2020-02-08 ENCOUNTER — Other Ambulatory Visit: Payer: Self-pay | Admitting: Family Medicine

## 2020-02-08 DIAGNOSIS — G8929 Other chronic pain: Secondary | ICD-10-CM

## 2020-02-08 MED ORDER — OXYCODONE HCL 15 MG PO TABS: 15.0000 mg | ORAL_TABLET | Freq: Four times a day (QID) | ORAL | 0 refills | Status: DC | PRN

## 2020-02-08 NOTE — Telephone Encounter (Signed)
Medication:  oxyCODONE (ROXICODONE) 15 MG immediate release tablet [[id number]]   Has the patient contacted their pharmacy? No  (Agent: If no, request that the patient contact the pharmacy for the refill.)  Preferred Pharmacy (with phone number or street name):  [facility] [place], [place] [address]  [place], [place] [address]  Phone:  [phone] Fax:  [phone]  Agent: Please be advised that RX refills may take up to 3 business days. We ask that you follow-up with your pharmacy.

## 2020-02-14 ENCOUNTER — Other Ambulatory Visit: Payer: Self-pay | Admitting: Family Medicine

## 2020-02-14 DIAGNOSIS — G4700 Insomnia, unspecified: Secondary | ICD-10-CM

## 2020-02-14 NOTE — Telephone Encounter (Signed)
   Notes to clinic:  Looks like patient only had 3 pills dispense last time Review for 90 tabs    Requested Prescriptions  Pending Prescriptions Disp Refills   traZODone (DESYREL) 150 MG tablet [Pharmacy Med Name: TRAZODONE 150MG  (HUNDRED-FIFTY) TAB] 90 tablet     Sig: TAKE 1 TABLET(150 MG) BY MOUTH AT BEDTIME AS NEEDED      Psychiatry: Antidepressants - Serotonin Modulator Passed - 02/14/2020  5:03 PM      Passed - Valid encounter within last 6 months    Recent Outpatient Visits           1 month ago Syncope and collapse   [facility] [name], MD   2 months ago Primary hypertension   [facility] [name], MD   1 year ago Annual physical exam   [facility] [name], MD   1 year ago Onychomycosis   [facility] [name], MD   1 year ago Painful urination   [place], Clearnce Sorrel, [place]       Future Appointments             In 2 months Fisher, [name], MD [facility], [place]

## 2020-02-19 IMAGING — CT CT CTA ABD/PEL W/CM AND/OR W/O CM
2 of 7 series · 14 of 46 positions shown, 16 images · IV contrast (APPLIED)
Comparison: 06/21/2016 and previous

CLINICAL DATA: AAA surgical planning Hx of chole Hx of prostate
cancer^75mL OMNIPAQUE IOHEXOL 350 MG/ML SOLN

EXAM:
CTA ABDOMEN AND PELVIS WITH CONTRAST
TECHNIQUE: Multidetector CT imaging of the abdomen and pelvis was performed
using the standard protocol during bolus administration of
intravenous contrast. Multiplanar reconstructed images and MIPs were
obtained and reviewed to evaluate the vascular anatomy.
CONTRAST:  75mL OMNIPAQUE IOHEXOL 350 MG/ML SOLN

[Series 4: axial arterial (person_name) · axial · arterial · 0.68mm/px · z∈[-615,-192]mm · 11 of 161 slices shown, 13 images]
[im 10/161  soft-tissue]
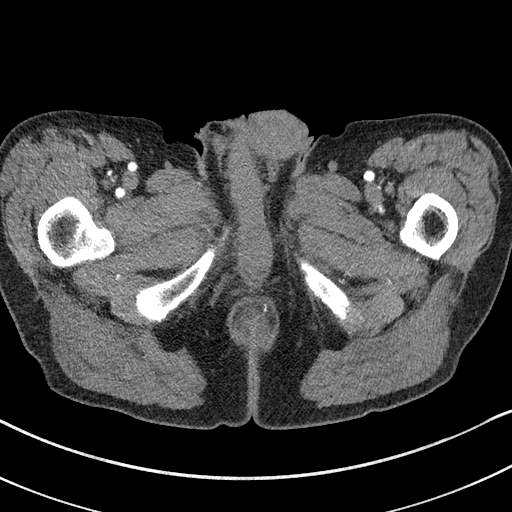
[im 10/161  bone]
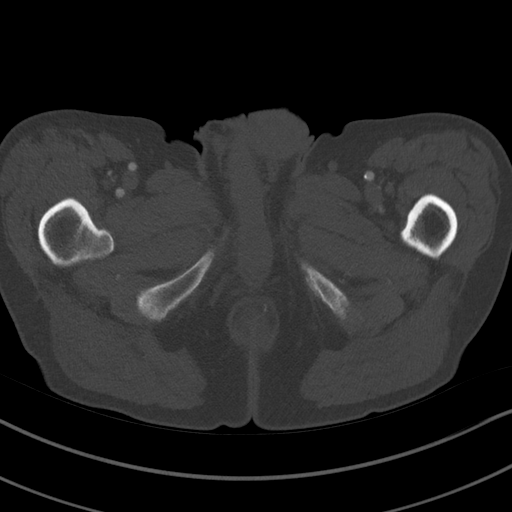
[im 29/161  soft-tissue]
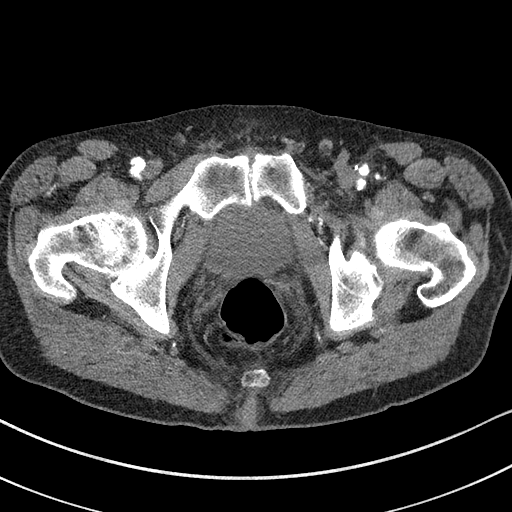
[im 38/161  soft-tissue]
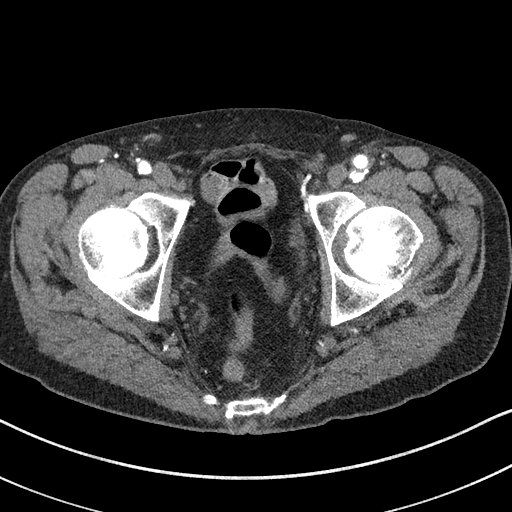
[im 57/161  soft-tissue]
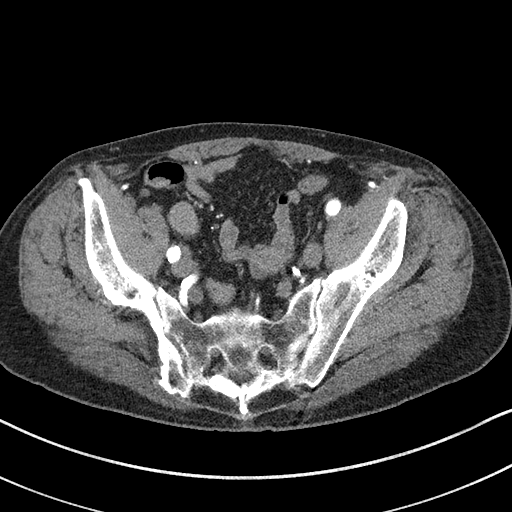
[im 66/161  soft-tissue]
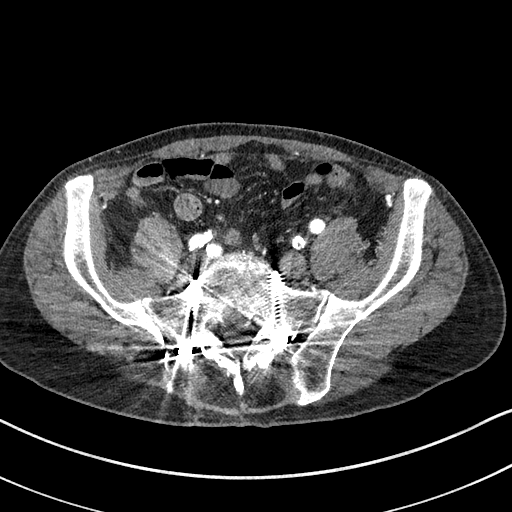
[im 85/161  soft-tissue]
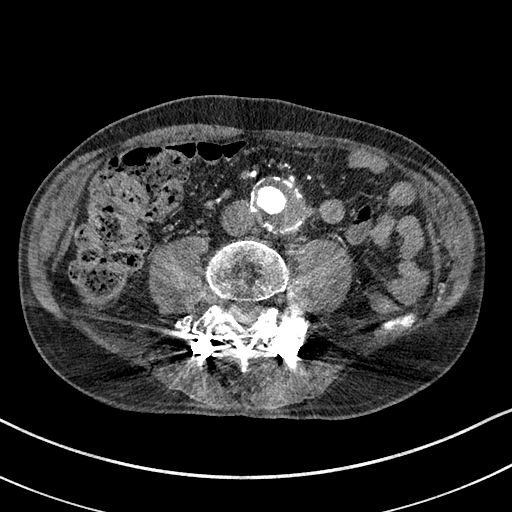
[im 95/161  soft-tissue]
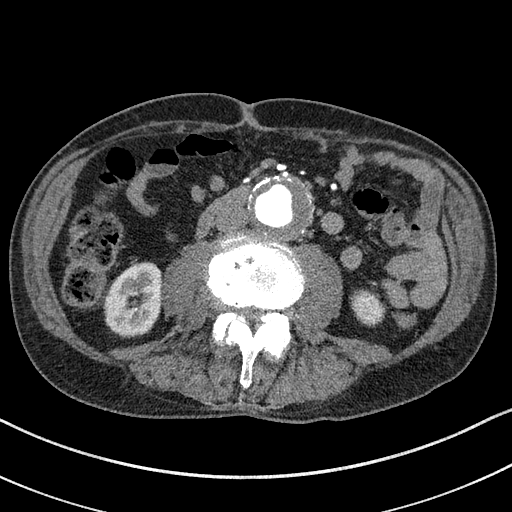
[im 104/161  soft-tissue]
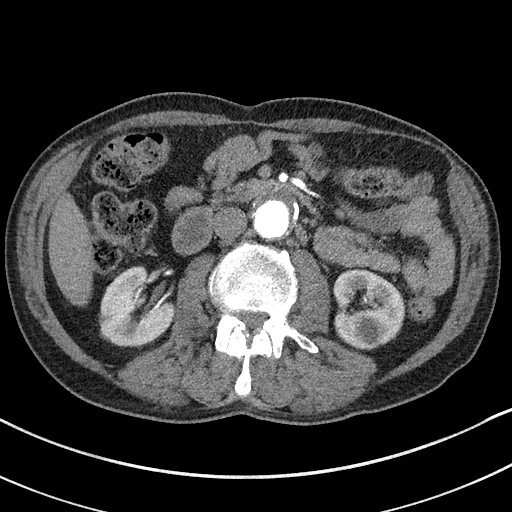
[im 123/161  soft-tissue]
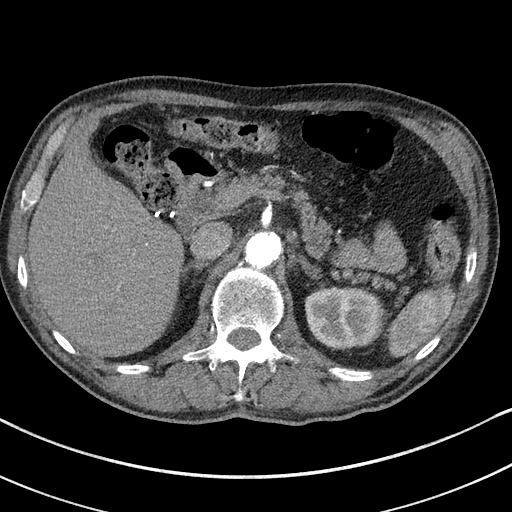
[im 123/161  bone]
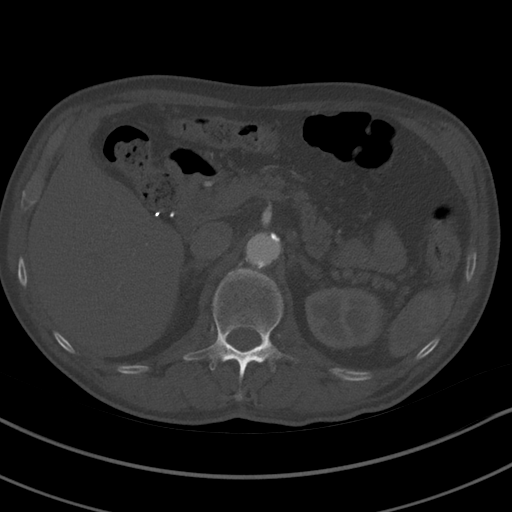
[im 132/161  soft-tissue]
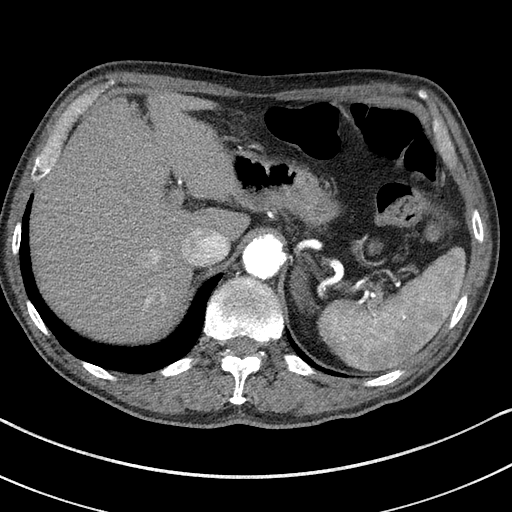
[im 151/161  soft-tissue]
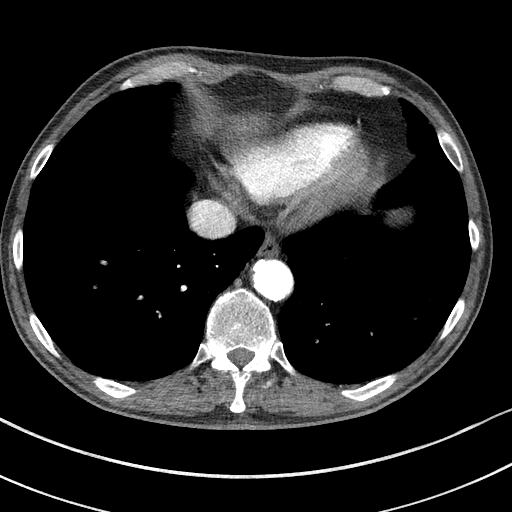

[Series 7: coronals · coronal · 0.64mm/px · 3 of 118 slices shown]
[im 30/118  soft-tissue]
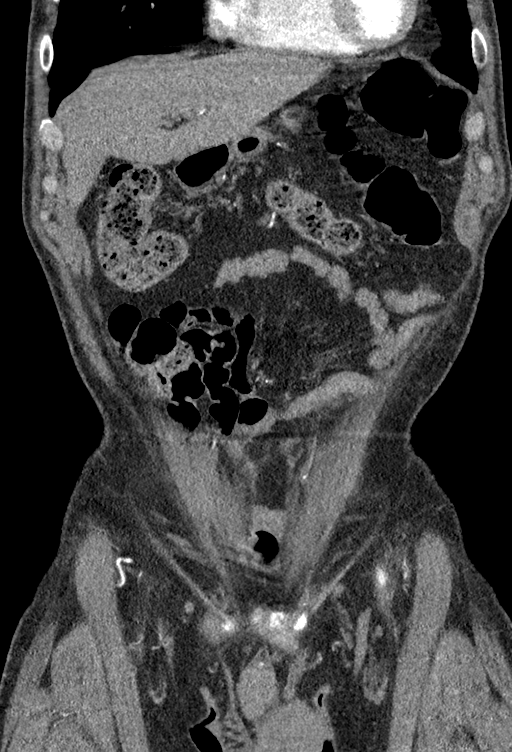
[im 59/118  soft-tissue]
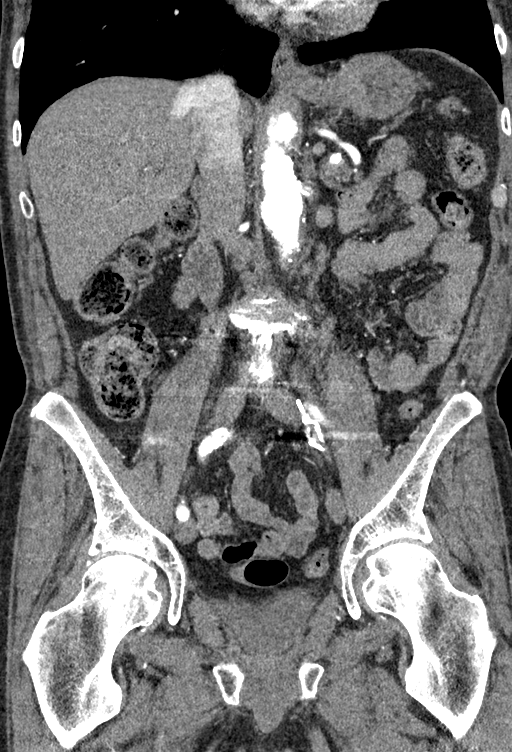
[im 88/118  soft-tissue]
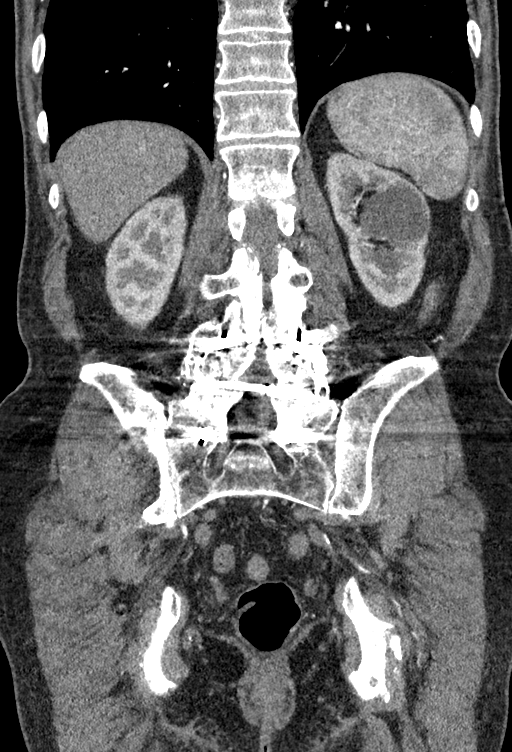

[14 of 46 positions shown; findings below may reference images not displayed]

FINDINGS: VASCULAR

Coronary calcifications.

Aorta: Eccentric nonocclusive mural thrombus in the visualized
distal descending thoracic segment. Partially calcified atheromatous
plaque in the visualized distal descending thoracic and suprarenal
segments. Ectatic 3 cm juxtarenal segment. 5 x 4.5 cm fusiform
infrarenal aneurysm, tapering to a diameter of 2.6 cm above the
bifurcation. There is a moderate amount of eccentric nonocclusive
mural thrombus in the aneurysmal segment. No dissection or stenosis.

Celiac: Partially calcified ostial plaque resulting in short segment
mild stenosis, patent distally.

SMA: Calcified ostial plaque resulting in short segment mild
stenosis, atheromatous but patent distally with classic distal
branch anatomy.

Renals: Single bilaterally. Partially calcified ostial plaque
resulting in short segment stenoses, right worse than left. Both
vessels patent distally.

IMA: Patent without evidence of aneurysm, dissection, vasculitis or
significant stenosis.

Inflow: Fusiform ectasia of the right common iliac up to 1.5 cm
diameter, left 1.6 cm. High-grade origin stenosis of the right
hypogastric artery. Chronic long segment occlusion of the distal
left common iliac, internal and external iliac arteries, with patent
graft from the proximal common iliac to the common femoral artery.
Calcified atheromatous plaque through the right external iliac
without high-grade stenosis. Mild tortuosity of the native iliac
arterial system.

Proximal Outflow: Short-segment stenosis in the proximal right SFA,
of possible hemodynamic significance..

Veins: Patent hepatic veins and portal vein, superior mesenteric
vein, splenic vein, bilateral renal veins, and IVC. No venous
pathology identified.

Review of the MIP images confirms the above findings.

NON-VASCULAR

Lower chest: No acute abnormality.

Hepatobiliary: No focal liver abnormality is seen. Status post
cholecystectomy. No biliary dilatation.

Pancreas: Mild atrophy without mass or ductal dilatation.

Spleen: Normal in size without focal abnormality.

Adrenals/Urinary Tract: Left adrenal fullness, stable since
10/02/2010. right adrenal unremarkable. Stable renal cysts. No
hydronephrosis. Urinary bladder incompletely distended.

Stomach/Bowel: Stomach and small bowel are decompressed. Moderate
proximal colonic fecal material without dilatation, decompressed
distally.

Lymphatic: No abdominal or pelvic adenopathy.

Reproductive: Prostate is unremarkable.

Other: No ascites. No free air.

Musculoskeletal: Previous instrumented fusion L4-S1. Adjacent level
degenerative disc disease L3-4. Negative for fracture or worrisome
bone lesion.
IMPRESSION: VASCULAR

1. 5cm fusiform infrarenal abdominal aortic aneurysm.
2. Patent left iliac arterial graft.
3. 1.6 cm fusiform right common iliac artery aneurysm.
4. Short segment right proximal SFA stenosis of possible hemodynamic
significance.

NON-VASCULAR

1. No acute findings.

## 2020-02-20 ENCOUNTER — Other Ambulatory Visit: Payer: Self-pay | Admitting: Neurology

## 2020-02-20 DIAGNOSIS — E538 Deficiency of other specified B group vitamins: Secondary | ICD-10-CM | POA: Diagnosis not present

## 2020-02-20 DIAGNOSIS — E612 Magnesium deficiency: Secondary | ICD-10-CM | POA: Diagnosis not present

## 2020-02-20 DIAGNOSIS — E559 Vitamin D deficiency, unspecified: Secondary | ICD-10-CM | POA: Diagnosis not present

## 2020-02-20 DIAGNOSIS — R569 Unspecified convulsions: Secondary | ICD-10-CM

## 2020-02-20 DIAGNOSIS — Z9889 Other specified postprocedural states: Secondary | ICD-10-CM | POA: Diagnosis not present

## 2020-02-20 DIAGNOSIS — I6523 Occlusion and stenosis of bilateral carotid arteries: Secondary | ICD-10-CM | POA: Diagnosis not present

## 2020-02-20 DIAGNOSIS — I714 Abdominal aortic aneurysm, without rupture: Secondary | ICD-10-CM | POA: Diagnosis not present

## 2020-03-07 ENCOUNTER — Other Ambulatory Visit: Payer: Self-pay | Admitting: Family Medicine

## 2020-03-07 DIAGNOSIS — M5441 Lumbago with sciatica, right side: Secondary | ICD-10-CM

## 2020-03-07 DIAGNOSIS — G8929 Other chronic pain: Secondary | ICD-10-CM

## 2020-03-07 MED ORDER — OXYCODONE HCL 15 MG PO TABS: 15.0000 mg | ORAL_TABLET | Freq: Four times a day (QID) | ORAL | 0 refills | Status: DC | PRN

## 2020-03-07 MED ORDER — HYDROCHLOROTHIAZIDE 25 MG PO TABS
25.0000 mg | ORAL_TABLET | Freq: Every day | ORAL | 4 refills | Status: DC
Start: 2020-03-07 — End: 2021-05-29

## 2020-03-07 NOTE — Telephone Encounter (Signed)
Requested medication (s) are due for refill today: yes  Requested medication (s) are on the active medication list: yes  Last refill: 02/08/20  Future visit scheduled: yes  Notes to clinic:  not delegated    Requested Prescriptions  Pending Prescriptions Disp Refills   oxyCODONE (ROXICODONE) 15 MG immediate release tablet 120 tablet 0    Sig: Take 1 tablet (15 mg total) by mouth 4 (four) times daily as needed for pain.      Not Delegated - Analgesics:  Opioid Agonists Failed - 03/07/2020 11:22 AM      Failed - This refill cannot be delegated      Failed - Urine Drug Screen completed in last 360 days      Passed - Valid encounter within last 6 months    Recent Outpatient Visits           2 months ago Syncope and collapse   [facility] [name], MD   3 months ago Primary hypertension   [facility] [name], MD   1 year ago Annual physical exam   [facility] [name], MD   1 year ago Onychomycosis   [facility] [name], MD   1 year ago Painful urination   [place], [place]       Future Appointments             In 1 month Fisher, [name], MD [facility], [facility]              Signed Prescriptions Disp Refills   hydrochlorothiazide (HYDRODIURIL) 25 MG tablet 90 tablet 4    Sig: Take 1 tablet (25 mg total) by mouth daily.      Cardiovascular: Diuretics - Thiazide Failed - 03/07/2020 11:23 AM      Failed - Ca in normal range and within 360 days    Calcium  Date Value Ref Range Status  12/13/2019 8.3 (L) 8.9 - 10.3 mg/dL Final   Calcium, Total  Date Value Ref Range Status  12/03/2011 8.7 8.5 - 10.1 mg/dL Final          Failed - Cr in normal range and within 360 days    Creatinine  Date Value Ref Range Status  12/03/2011 0.83 0.60 - 1.30 mg/dL Final   Creatinine, Ser  Date Value Ref Range Status  12/13/2019 1.35 (H)  0.61 - 1.24 mg/dL Final          Failed - Last BP in normal range    BP Readings from Last 1 Encounters:  12/21/19 (!) 155/81          Passed - K in normal range and within 360 days    Potassium  Date Value Ref Range Status  12/13/2019 3.6 3.5 - 5.1 mmol/L Final  12/03/2011 3.5 3.5 - 5.1 mmol/L Final          Passed - Na in normal range and within 360 days    Sodium  Date Value Ref Range Status  12/13/2019 138 135 - 145 mmol/L Final  11/27/2019 141 134 - 144 mmol/L Final  12/03/2011 144 136 - 145 mmol/L Final          Passed - Valid encounter within last 6 months    Recent Outpatient Visits           2 months ago Syncope and collapse   [facility] [name], MD  3 months ago Primary hypertension   [facility] [name], MD   1 year ago Annual physical exam   [facility] [name], MD   1 year ago Onychomycosis   [facility] [name], MD   1 year ago Painful urination   [place], Clearnce Sorrel, [place]       Future Appointments             In 1 month Fisher, [name], MD [facility], [place]

## 2020-03-07 NOTE — Telephone Encounter (Signed)
Requested Prescriptions  Pending Prescriptions Disp Refills  . oxyCODONE (ROXICODONE) 15 MG immediate release tablet 120 tablet 0    Sig: Take 1 tablet (15 mg total) by mouth 4 (four) times daily as needed for pain.     Not Delegated - Analgesics:  Opioid Agonists Failed - 03/07/2020 11:22 AM      Failed - This refill cannot be delegated      Failed - Urine Drug Screen completed in last 360 days      Passed - Valid encounter within last 6 months    Recent Outpatient Visits          2 months ago Syncope and collapse   [facility] [name], MD   3 months ago Primary hypertension   [facility] [name], MD   1 year ago Annual physical exam   [facility] [name], MD   1 year ago Onychomycosis   [facility] [name], MD   1 year ago Painful urination   [place], Clearnce Sorrel, [place]      Future Appointments            In 1 month Fisher, [name], MD [facility], [facility]           . hydrochlorothiazide (HYDRODIURIL) 25 MG tablet 90 tablet 4    Sig: Take 1 tablet (25 mg total) by mouth daily.     Cardiovascular: Diuretics - Thiazide Failed - 03/07/2020 11:23 AM      Failed - Ca in normal range and within 360 days    Calcium  Date Value Ref Range Status  12/13/2019 8.3 (L) 8.9 - 10.3 mg/dL Final   Calcium, Total  Date Value Ref Range Status  12/03/2011 8.7 8.5 - 10.1 mg/dL Final         Failed - Cr in normal range and within 360 days    Creatinine  Date Value Ref Range Status  12/03/2011 0.83 0.60 - 1.30 mg/dL Final   Creatinine, Ser  Date Value Ref Range Status  12/13/2019 1.35 (H) 0.61 - 1.24 mg/dL Final         Failed - Last BP in normal range    BP Readings from Last 1 Encounters:  12/21/19 (!) 155/81         Passed - K in normal range and within 360 days    Potassium  Date Value Ref Range Status  12/13/2019 3.6 3.5 - 5.1 mmol/L  Final  12/03/2011 3.5 3.5 - 5.1 mmol/L Final         Passed - Na in normal range and within 360 days    Sodium  Date Value Ref Range Status  12/13/2019 138 135 - 145 mmol/L Final  11/27/2019 141 134 - 144 mmol/L Final  12/03/2011 144 136 - 145 mmol/L Final         Passed - Valid encounter within last 6 months    Recent Outpatient Visits          2 months ago Syncope and collapse   [facility] [name], MD   3 months ago Primary hypertension   [facility] [name], MD   1 year ago Annual physical exam   [facility] [name], MD   1 year ago Onychomycosis   [facility] [name], MD   1 year ago Painful urination   [facility] Mar Daring, PA-C      Future Appointments            In 1 month Fisher, [name], MD [facility], [place]

## 2020-03-07 NOTE — Telephone Encounter (Signed)
Medication Refill - Medication: oxyCODONE (ROXICODONE) 15 MG immediate release tablet  hydrochlorothiazide (HYDRODIURIL) 25 MG tablet  Has the patient contacted their pharmacy? No. (Agent: If no, request that the patient contact the pharmacy for the refill.) (Agent: If yes, when and what did the pharmacy advise?)  Preferred Pharmacy (with phone number or street name): [facility] STORE [place], [place] [address] AT [address]  [place], [place] [address]  Phone:  [phone] Fax:  [phone]   Agent: Please be advised that RX refills may take up to 3 business days. We ask that you follow-up with your pharmacy.

## 2020-03-11 ENCOUNTER — Other Ambulatory Visit: Payer: Self-pay

## 2020-03-11 ENCOUNTER — Ambulatory Visit
Admission: RE | Admit: 2020-03-11 | Discharge: 2020-03-11 | Disposition: A | Discharge status: Home / Self Care | Payer: Medicare HMO | Source: Ambulatory Visit | Attending: Neurology | Admitting: Neurology

## 2020-03-11 DIAGNOSIS — R569 Unspecified convulsions: Secondary | ICD-10-CM | POA: Diagnosis not present

## 2020-03-11 MED ORDER — GADOBUTROL 1 MMOL/ML IV SOLN
7.0000 mL | Freq: Once | INTRAVENOUS | Status: AC | PRN
  Administered 2020-03-11: 7 mL via INTRAVENOUS

## 2020-03-14 DIAGNOSIS — R569 Unspecified convulsions: Secondary | ICD-10-CM | POA: Diagnosis not present

## 2020-04-07 ENCOUNTER — Other Ambulatory Visit: Payer: Self-pay | Admitting: Family Medicine

## 2020-04-07 DIAGNOSIS — G8929 Other chronic pain: Secondary | ICD-10-CM

## 2020-04-07 MED ORDER — OXYCODONE HCL 15 MG PO TABS: 15.0000 mg | ORAL_TABLET | Freq: Four times a day (QID) | ORAL | 0 refills | Status: DC | PRN

## 2020-04-07 NOTE — Telephone Encounter (Signed)
Requested medication (s) are due for refill today: yes  Requested medication (s) are on the active medication list: yes  Last refill:  03/07/2020  Future visit scheduled: yes  Notes to clinic: this refill cannot be delegated    Requested Prescriptions  Pending Prescriptions Disp Refills   oxyCODONE (ROXICODONE) 15 MG immediate release tablet 120 tablet 0    Sig: Take 1 tablet (15 mg total) by mouth 4 (four) times daily as needed for pain.      There is no refill protocol information for this order

## 2020-04-07 NOTE — Telephone Encounter (Signed)
Medication Refill - Medication: oxyCODONE (ROXICODONE) 15 MG immediate release tablet     Preferred Pharmacy (with phone number or street name):  [facility] #[id number] - [place], [place] [address] AT [place] Phone:  [phone]  Fax:  [phone]       Agent: Please be advised that RX refills may take up to 3 business days. We ask that you follow-up with your pharmacy.

## 2020-04-14 ENCOUNTER — Ambulatory Visit: Payer: Self-pay | Admitting: Family Medicine

## 2020-04-21 ENCOUNTER — Ambulatory Visit: Payer: Self-pay | Admitting: Family Medicine

## 2020-05-06 ENCOUNTER — Telehealth: Payer: Self-pay

## 2020-05-06 DIAGNOSIS — G8929 Other chronic pain: Secondary | ICD-10-CM

## 2020-05-06 DIAGNOSIS — M5442 Lumbago with sciatica, left side: Secondary | ICD-10-CM

## 2020-05-06 MED ORDER — OXYCODONE HCL 15 MG PO TABS: 15.0000 mg | ORAL_TABLET | Freq: Four times a day (QID) | ORAL | 0 refills | Status: DC | PRN

## 2020-05-06 NOTE — Telephone Encounter (Signed)
Patient is wanting a written Rx for Oxycodone 15mg  to  take with him to [facility] He will be out of town for 2 weeks due to the birth of his grandchild.  Call when rx is ready for pickup to [phone]

## 2020-05-07 ENCOUNTER — Telehealth: Payer: Self-pay

## 2020-05-07 NOTE — Telephone Encounter (Signed)
Prescription was printed yesterday and signed by Dr. Caryn Section. I called and advised patients wife that prescription is ready for pick up.

## 2020-05-07 NOTE — Telephone Encounter (Signed)
Oxycodone was sent to his pharmacy yesterday.

## 2020-05-07 NOTE — Telephone Encounter (Signed)
Copied from [place] [id number]. Topic: General - Other >> May 07, 2020 10:46 AM Tessa Lerner A wrote: Reason for CRM: Patient has made additional contact regarding a prescription for pain medication for their back  Patient will be traveling 05/10/20 and would like to have the medication before then  Patient would like to be notified when the prescription has been called in  Please contact to further advise when possible

## 2020-05-08 DIAGNOSIS — R569 Unspecified convulsions: Secondary | ICD-10-CM | POA: Diagnosis not present

## 2020-06-05 ENCOUNTER — Other Ambulatory Visit: Payer: Self-pay | Admitting: Family Medicine

## 2020-06-05 DIAGNOSIS — G8929 Other chronic pain: Secondary | ICD-10-CM

## 2020-06-05 DIAGNOSIS — M5441 Lumbago with sciatica, right side: Secondary | ICD-10-CM

## 2020-06-05 NOTE — Telephone Encounter (Signed)
Medication Refill - Medication: Oxycodone   Has the patient contacted their pharmacy? No. Pt states that he must call in medication to pcp every time. Please advise.  (Agent: If no, request that the patient contact the pharmacy for the refill.) (Agent: If yes, when and what did the pharmacy advise?)  Preferred Pharmacy (with phone number or street name):  [facility] STORE [place], [place] - [address] AT [address]  [place] [address]  Phone: [phone] Fax: [phone]  Hours: Not open 24 hours     Agent: Please be advised that RX refills may take up to 3 business days. We ask that you follow-up with your pharmacy.

## 2020-06-05 NOTE — Telephone Encounter (Signed)
Requested medication (s) are due for refill today: yes  Requested medication (s) are on the active medication list: yes  Last refill:  05/06/20 #120 0 refills  Future visit scheduled: no  Notes to clinic:  not delegated per protocol     Requested Prescriptions  Pending Prescriptions Disp Refills   oxyCODONE (ROXICODONE) 15 MG immediate release tablet 120 tablet 0    Sig: Take 1 tablet (15 mg total) by mouth 4 (four) times daily as needed for pain.      Not Delegated - Analgesics:  Opioid Agonists Failed - 06/05/2020 10:34 AM      Failed - This refill cannot be delegated      Failed - Urine Drug Screen completed in last 360 days      Passed - Valid encounter within last 6 months    Recent Outpatient Visits           5 months ago Syncope and collapse   [facility] [name], MD   6 months ago Primary hypertension   [facility] [name], MD   1 year ago Annual physical exam   [facility] [name], MD   2 years ago Onychomycosis   [facility] [name], MD   2 years ago Painful urination   [facility] Fenton Malling [place], [place]

## 2020-06-06 MED ORDER — OXYCODONE HCL 15 MG PO TABS: 15.0000 mg | ORAL_TABLET | Freq: Four times a day (QID) | ORAL | 0 refills | Status: DC | PRN

## 2020-06-06 NOTE — Addendum Note (Signed)
Addended by: [name] on: 06/06/2020 05:51 PM   Modules accepted: Orders

## 2020-06-12 ENCOUNTER — Encounter (INDEPENDENT_AMBULATORY_CARE_PROVIDER_SITE_OTHER): Payer: PPO

## 2020-06-12 ENCOUNTER — Ambulatory Visit (INDEPENDENT_AMBULATORY_CARE_PROVIDER_SITE_OTHER): Payer: PPO | Admitting: Nurse Practitioner

## 2020-06-12 ENCOUNTER — Other Ambulatory Visit (INDEPENDENT_AMBULATORY_CARE_PROVIDER_SITE_OTHER): Payer: PPO

## 2020-06-25 DIAGNOSIS — Z9889 Other specified postprocedural states: Secondary | ICD-10-CM | POA: Diagnosis not present

## 2020-06-25 DIAGNOSIS — I6523 Occlusion and stenosis of bilateral carotid arteries: Secondary | ICD-10-CM | POA: Diagnosis not present

## 2020-06-25 DIAGNOSIS — F17209 Nicotine dependence, unspecified, with unspecified nicotine-induced disorders: Secondary | ICD-10-CM | POA: Diagnosis not present

## 2020-06-25 DIAGNOSIS — R569 Unspecified convulsions: Secondary | ICD-10-CM | POA: Diagnosis not present

## 2020-07-04 ENCOUNTER — Other Ambulatory Visit: Payer: Self-pay | Admitting: Family Medicine

## 2020-07-04 DIAGNOSIS — G8929 Other chronic pain: Secondary | ICD-10-CM

## 2020-07-04 DIAGNOSIS — M5442 Lumbago with sciatica, left side: Secondary | ICD-10-CM

## 2020-07-04 MED ORDER — OXYCODONE HCL 15 MG PO TABS: 15.0000 mg | ORAL_TABLET | Freq: Four times a day (QID) | ORAL | 0 refills | Status: DC | PRN

## 2020-07-04 NOTE — Telephone Encounter (Signed)
Requested medication (s) are due for refill today: Yes  Requested medication (s) are on the active medication list: Yes  Last refill:  06/06/20  Future visit scheduled: No  Notes to clinic:  See request.    Requested Prescriptions  Pending Prescriptions Disp Refills   oxyCODONE (ROXICODONE) 15 MG immediate release tablet 120 tablet 0    Sig: Take 1 tablet (15 mg total) by mouth 4 (four) times daily as needed for pain.      Not Delegated - Analgesics:  Opioid Agonists Failed - 07/04/2020 11:50 AM      Failed - This refill cannot be delegated      Failed - Urine Drug Screen completed in last 360 days      Failed - Valid encounter within last 6 months    Recent Outpatient Visits           6 months ago Syncope and collapse   [facility] [name], MD   7 months ago Primary hypertension   [facility] [name], MD   1 year ago Annual physical exam   [facility] [name], MD   2 years ago Onychomycosis   [facility] [name], MD   2 years ago Painful urination   [facility] Fenton Malling [place], [place]

## 2020-07-04 NOTE — Telephone Encounter (Signed)
Medication: oxyCODONE (ROXICODONE) 15 MG immediate release tablet [[id number]]   Has the patient contacted their pharmacy? YES  (Agent: If no, request that the patient contact the pharmacy for the refill.) (Agent: If yes, when and what did the pharmacy advise?)  Preferred Pharmacy (with phone number or street name): [facility] STORE [place], [place] - [address] AT [address] [place] [address] Phone: [phone] Fax: [phone] Hours: Not open 24 hours    Agent: Please be advised that RX refills may take up to 3 business days. We ask that you follow-up with your pharmacy.

## 2020-08-05 ENCOUNTER — Other Ambulatory Visit: Payer: Self-pay | Admitting: Family Medicine

## 2020-08-05 DIAGNOSIS — G8929 Other chronic pain: Secondary | ICD-10-CM

## 2020-08-05 MED ORDER — OXYCODONE HCL 15 MG PO TABS: 15.0000 mg | ORAL_TABLET | Freq: Four times a day (QID) | ORAL | 0 refills | Status: DC | PRN

## 2020-08-05 NOTE — Telephone Encounter (Signed)
Requested medication (s) are due for refill today: yes  Requested medication (s) are on the active medication list: yes   Last refill:  07/04/2020  Future visit scheduled: no  Notes to clinic:  this refill cannot be delegated   Requested Prescriptions  Pending Prescriptions Disp Refills   oxyCODONE (ROXICODONE) 15 MG immediate release tablet 120 tablet 0    Sig: Take 1 tablet (15 mg total) by mouth 4 (four) times daily as needed for pain.      Not Delegated - Analgesics:  Opioid Agonists Failed - 08/05/2020 10:28 AM      Failed - This refill cannot be delegated      Failed - Urine Drug Screen completed in last 360 days      Failed - Valid encounter within last 6 months    Recent Outpatient Visits           7 months ago Syncope and collapse   [facility] [name], MD   8 months ago Primary hypertension   [facility] [name], MD   1 year ago Annual physical exam   [facility] [name], MD   2 years ago Onychomycosis   [facility] [name], MD   2 years ago Painful urination   [facility] Fenton Malling [place], [place]

## 2020-08-05 NOTE — Telephone Encounter (Signed)
Medication Refill - Medication: oxyCODONE (ROXICODONE) 15 MG immediate release tablet .  Has the patient contacted their pharmacy? No.  Preferred Pharmacy (with phone number or street name):  [id number] [place] [address] AT [place] [address] Phone: [phone] Fax: [phone] Hours: Not open 24 hours   Agent: Please be advised that RX refills may take up to 3 business days. We ask that you follow-up with your pharmacy.

## 2020-08-29 ENCOUNTER — Other Ambulatory Visit: Payer: Self-pay | Admitting: Family Medicine

## 2020-08-29 DIAGNOSIS — G8929 Other chronic pain: Secondary | ICD-10-CM

## 2020-08-29 NOTE — Telephone Encounter (Signed)
Requested medication (s) are due for refill today- yes  Requested medication (s) are on the active medication list -yes  Future visit scheduled -no  Last refill: 08/05/20  Notes to clinic: Request RF- non delegated Rx  Requested Prescriptions  Pending Prescriptions Disp Refills   oxyCODONE (ROXICODONE) 15 MG immediate release tablet 120 tablet 0    Sig: Take 1 tablet (15 mg total) by mouth 4 (four) times daily as needed for pain.      Not Delegated - Analgesics:  Opioid Agonists Failed - 08/29/2020 12:35 PM      Failed - This refill cannot be delegated      Failed - Urine Drug Screen completed in last 360 days      Failed - Valid encounter within last 6 months    Recent Outpatient Visits           8 months ago Syncope and collapse   [facility] [name], MD   9 months ago Primary hypertension   [facility] [name], MD   1 year ago Annual physical exam   [facility] [name], MD   2 years ago Onychomycosis   [facility] [name], MD   2 years ago Painful urination   [facility] Fenton Malling [place], [place]                    Requested Prescriptions  Pending Prescriptions Disp Refills   oxyCODONE (ROXICODONE) 15 MG immediate release tablet 120 tablet 0    Sig: Take 1 tablet (15 mg total) by mouth 4 (four) times daily as needed for pain.      Not Delegated - Analgesics:  Opioid Agonists Failed - 08/29/2020 12:35 PM      Failed - This refill cannot be delegated      Failed - Urine Drug Screen completed in last 360 days      Failed - Valid encounter within last 6 months    Recent Outpatient Visits           8 months ago Syncope and collapse   [facility] [name], MD   9 months ago Primary hypertension   [facility] [name], MD   1 year ago Annual physical exam   [facility] [name],  MD   2 years ago Onychomycosis   [facility] [name], MD   2 years ago Painful urination   [facility] Fenton Malling [place], [place]

## 2020-08-29 NOTE — Telephone Encounter (Signed)
Please review. Thanks!  

## 2020-08-29 NOTE — Telephone Encounter (Signed)
Medication: oxyCODONE (ROXICODONE) 15 MG immediate release tablet Has the pt contacted their pharmacy? No  he calls in every month  Preferred pharmacy: [place], [place] [address] AT [place]  Please be advised refills may take up to 3 business days.  We ask that you follow up with your pharmacy.

## 2020-08-31 MED ORDER — OXYCODONE HCL 15 MG PO TABS: 15.0000 mg | ORAL_TABLET | Freq: Four times a day (QID) | ORAL | 0 refills | Status: DC | PRN

## 2020-09-25 ENCOUNTER — Other Ambulatory Visit: Payer: Self-pay | Admitting: Family Medicine

## 2020-09-25 DIAGNOSIS — G8929 Other chronic pain: Secondary | ICD-10-CM

## 2020-09-25 MED ORDER — OXYCODONE HCL 15 MG PO TABS: 15.0000 mg | ORAL_TABLET | Freq: Four times a day (QID) | ORAL | 0 refills | Status: DC | PRN

## 2020-09-25 NOTE — Telephone Encounter (Signed)
Requested medication (s) are due for refill today: yes  Requested medication (s) are on the active medication list: yes   Last refill:  08/29/2020  Future visit scheduled:  no  Notes to clinic:  this refill cannot be delegated    Requested Prescriptions  Pending Prescriptions Disp Refills   oxyCODONE (ROXICODONE) 15 MG immediate release tablet 120 tablet 0    Sig: Take 1 tablet (15 mg total) by mouth 4 (four) times daily as needed for pain.     Not Delegated - Analgesics:  Opioid Agonists Failed - 09/25/2020  9:46 AM      Failed - This refill cannot be delegated      Failed - Urine Drug Screen completed in last 360 days      Failed - Valid encounter within last 6 months    Recent Outpatient Visits           9 months ago Syncope and collapse   [facility] [name], MD   10 months ago Primary hypertension   [facility] [name], MD   1 year ago Annual physical exam   [facility] [name], MD   2 years ago Onychomycosis   [facility] [name], MD   2 years ago Painful urination   [facility] Fenton Malling [place], [place]

## 2020-09-25 NOTE — Telephone Encounter (Signed)
Medication: oxyCODONE (ROXICODONE) 15 MG immediate release tablet Has the pt contacted their pharmacy? no Preferred pharmacy: [place], [place] [address] AT [place]  Please be advised refills may take up to 3 business days.  We ask that you follow up with your pharmacy.

## 2020-09-25 NOTE — Telephone Encounter (Signed)
LOV: 12/21/2019 NOV: None Last refill: 08/29/2020 #120 0 refills.   Urine drug screen: N/A  Thanks,   Mickel Baas

## 2020-10-20 ENCOUNTER — Other Ambulatory Visit: Payer: Self-pay | Admitting: Family Medicine

## 2020-10-20 DIAGNOSIS — G8929 Other chronic pain: Secondary | ICD-10-CM

## 2020-10-20 NOTE — Telephone Encounter (Signed)
Medication Refill - Medication: oxycodone 15 mg  Has the patient contacted their pharmacy? No. (Agent: If no, request that the patient contact the pharmacy for the refill.) due to controlled substance Preferred Pharmacy (with phone number or street name): [facility] [place] phone number [phone] Has the patient been seen for an appointment in the last year OR does the patient have an upcoming appointment? Yes.    Agent: Please be advised that RX refills may take up to 3 business days. We ask that you follow-up with your pharmacy.

## 2020-10-20 NOTE — Telephone Encounter (Signed)
Requested medication (s) are due for refill today: yes  Requested medication (s) are on the active medication list: yes  Last refill:  09/25/20 #120 0 refills  Future visit scheduled: no  Notes to clinic:  not delegated per protocol. Called patient to schedule appt no answer . LMTCB.      Requested Prescriptions  Pending Prescriptions Disp Refills   oxyCODONE (ROXICODONE) 15 MG immediate release tablet 120 tablet 0    Sig: Take 1 tablet (15 mg total) by mouth 4 (four) times daily as needed for pain.     Not Delegated - Analgesics:  Opioid Agonists Failed - 10/20/2020  1:17 PM      Failed - This refill cannot be delegated      Failed - Urine Drug Screen completed in last 360 days      Failed - Valid encounter within last 6 months    Recent Outpatient Visits           10 months ago Syncope and collapse   [facility] [name], MD   10 months ago Primary hypertension   [facility] [name], MD   2 years ago Annual physical exam   [facility] [name], MD   2 years ago Onychomycosis   [facility] [name], MD   2 years ago Painful urination   [facility] Fenton Malling [place], [place]

## 2020-10-21 NOTE — Telephone Encounter (Signed)
30 day supply dispensed 09-25-2020

## 2020-10-24 MED ORDER — OXYCODONE HCL 15 MG PO TABS: 15.0000 mg | ORAL_TABLET | Freq: Four times a day (QID) | ORAL | 0 refills | Status: DC | PRN

## 2020-11-18 ENCOUNTER — Other Ambulatory Visit: Payer: Self-pay | Admitting: Family Medicine

## 2020-11-18 DIAGNOSIS — G8929 Other chronic pain: Secondary | ICD-10-CM

## 2020-11-18 DIAGNOSIS — M5442 Lumbago with sciatica, left side: Secondary | ICD-10-CM

## 2020-11-18 NOTE — Telephone Encounter (Signed)
Medication Refill - Medication: Oxycodone 15  Has the patient contacted their pharmacy? No. (Agent: If no, request that the patient contact the pharmacy for the refill.) (Agent: If yes, when and what did the pharmacy advise?)  Preferred Pharmacy (with phone number or street name): [facility] [place]  pt picks up paper rx at the office Has the patient been seen for an appointment in the last year OR does the patient have an upcoming appointment? Yes.    Agent: Please be advised that RX refills may take up to 3 business days. We ask that you follow-up with your pharmacy.

## 2020-11-18 NOTE — Telephone Encounter (Signed)
Medication is not delegated per protocol, please review, patient requesting paper script.

## 2020-11-21 MED ORDER — OXYCODONE HCL 15 MG PO TABS: 15.0000 mg | ORAL_TABLET | Freq: Four times a day (QID) | ORAL | 0 refills | Status: DC | PRN

## 2020-12-05 ENCOUNTER — Telehealth: Payer: Self-pay | Admitting: Family Medicine

## 2020-12-05 DIAGNOSIS — F17210 Nicotine dependence, cigarettes, uncomplicated: Secondary | ICD-10-CM

## 2020-12-05 NOTE — Telephone Encounter (Signed)
Justin Harrington, from [facility], calling stating that they sent over a fax yesterday and 11/27/20 to request medical records. She is requesting to confirm this has been received. Please advise.        [phone]

## 2020-12-05 NOTE — Telephone Encounter (Signed)
Please review.  Have you seen this fax?  Thanks,   -Mickel Baas

## 2020-12-09 NOTE — Telephone Encounter (Signed)
Haven't seen it 

## 2020-12-19 ENCOUNTER — Other Ambulatory Visit (INDEPENDENT_AMBULATORY_CARE_PROVIDER_SITE_OTHER): Payer: Medicare HMO

## 2020-12-19 ENCOUNTER — Ambulatory Visit (INDEPENDENT_AMBULATORY_CARE_PROVIDER_SITE_OTHER): Payer: Self-pay | Admitting: Vascular Surgery

## 2020-12-19 ENCOUNTER — Encounter (INDEPENDENT_AMBULATORY_CARE_PROVIDER_SITE_OTHER): Payer: Self-pay

## 2020-12-20 NOTE — Telephone Encounter (Signed)
Please advise patient we got copy of chest XR from [place]. 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 Needs to have LDCT of chest for further evaluation, which he is due for anyway. Have place order and he should get a call from pulmonary office to get it scheduled.

## 2020-12-22 ENCOUNTER — Other Ambulatory Visit: Payer: Self-pay | Admitting: Family Medicine

## 2020-12-22 DIAGNOSIS — G8929 Other chronic pain: Secondary | ICD-10-CM

## 2020-12-22 NOTE — Telephone Encounter (Signed)
Requested medication (s) are due for refill today - yes  Requested medication (s) are on the active medication list -yes  Future visit scheduled -no  Last refill: 11/21/20 #120  Notes to clinic: Request RF: non delegated Rx  Requested Prescriptions  Pending Prescriptions Disp Refills   oxyCODONE (ROXICODONE) 15 MG immediate release tablet 120 tablet 0    Sig: Take 1 tablet (15 mg total) by mouth 4 (four) times daily as needed for pain.     Not Delegated - Analgesics:  Opioid Agonists Failed - 12/22/2020  3:10 PM      Failed - This refill cannot be delegated      Failed - Urine Drug Screen completed in last 360 days      Failed - Valid encounter within last 6 months    Recent Outpatient Visits           1 year ago Syncope and collapse   [facility] [name], [facility]   1 year ago Primary hypertension   [facility] [name], MD   2 years ago Annual physical exam   [facility] [name], MD   2 years ago Onychomycosis   [facility] [name], MD   2 years ago Painful urination   [facility] Fenton Malling [place], [place]                 Requested Prescriptions  Pending Prescriptions Disp Refills   oxyCODONE (ROXICODONE) 15 MG immediate release tablet 120 tablet 0    Sig: Take 1 tablet (15 mg total) by mouth 4 (four) times daily as needed for pain.     Not Delegated - Analgesics:  Opioid Agonists Failed - 12/22/2020  3:10 PM      Failed - This refill cannot be delegated      Failed - Urine Drug Screen completed in last 360 days      Failed - Valid encounter within last 6 months    Recent Outpatient Visits           1 year ago Syncope and collapse   [facility] [name], MD   1 year ago Primary hypertension   [facility] [name], MD   2 years ago Annual physical exam   [facility] [name], MD    2 years ago Onychomycosis   [facility] [name], MD   2 years ago Painful urination   [facility] Fenton Malling [place], [place]

## 2020-12-22 NOTE — Telephone Encounter (Signed)
Medication Refill - Medication:   oxyCODONE (ROXICODONE) 15 MG immediate release tablet  Has the patient contacted their pharmacy? Yes.   Contact Drs office for refills. Pt is going out of town for the holidays and was hoping to pick this today if possible.    Preferred Pharmacy (with phone number or street name):    [id number] [place] [address] [place]  [place] [address]  Phone: [phone] Fax: [phone]   Has the patient been seen for an appointment in the last year OR does the patient have an upcoming appointment? Yes.    Agent: Please be advised that RX refills may take up to 3 business days. We ask that you follow-up with your pharmacy.

## 2020-12-22 NOTE — Telephone Encounter (Signed)
Pt calling back to follow up on Rx. Pt aware Dr Caryn Section is out today.  Pt would like Dr Caryn Section to call it in asap. Or first thing in the am. oxyCODONE (ROXICODONE) 15 MG immediate release tablet  [id number] - [place], [place] [address] AT [place]

## 2020-12-22 NOTE — Telephone Encounter (Signed)
Requested medication (s) are due for refill today - Yes  Requested medication (s) are on the active medication list - Yes  Future visit scheduled -no  Last refill: 11/21/20 #120  Notes to clinic: Request RF: duplicate request- non delegated Rx  Requested Prescriptions  Pending Prescriptions Disp Refills   oxyCODONE (ROXICODONE) 15 MG immediate release tablet 120 tablet 0    Sig: Take 1 tablet (15 mg total) by mouth 4 (four) times daily as needed for pain.     Not Delegated - Analgesics:  Opioid Agonists Failed - 12/22/2020  3:10 PM      Failed - This refill cannot be delegated      Failed - Urine Drug Screen completed in last 360 days      Failed - Valid encounter within last 6 months    Recent Outpatient Visits           1 year ago Syncope and collapse   [facility] [name], MD   1 year ago Primary hypertension   [facility] [name], MD   2 years ago Annual physical exam   [facility] [name], MD   2 years ago Onychomycosis   [facility] [name], MD   2 years ago Painful urination   [facility] Fenton Malling [place], [place]                 Requested Prescriptions  Pending Prescriptions Disp Refills   oxyCODONE (ROXICODONE) 15 MG immediate release tablet 120 tablet 0    Sig: Take 1 tablet (15 mg total) by mouth 4 (four) times daily as needed for pain.     Not Delegated - Analgesics:  Opioid Agonists Failed - 12/22/2020  3:10 PM      Failed - This refill cannot be delegated      Failed - Urine Drug Screen completed in last 360 days      Failed - Valid encounter within last 6 months    Recent Outpatient Visits           1 year ago Syncope and collapse   [facility] [name], MD   1 year ago Primary hypertension   [facility] [name], MD   2 years ago Annual physical exam   [facility]  [name], MD   2 years ago Onychomycosis   [facility] [name], MD   2 years ago Painful urination   [facility] Fenton Malling [place], [place]

## 2020-12-22 NOTE — Telephone Encounter (Signed)
Patient's wife Santiago Glad advised and verbalized understanding (ok per DPR). She agrees to give the message to her husband.

## 2020-12-23 MED ORDER — OXYCODONE HCL 15 MG PO TABS: 15.0000 mg | ORAL_TABLET | Freq: Four times a day (QID) | ORAL | 0 refills | Status: DC | PRN

## 2021-01-13 ENCOUNTER — Other Ambulatory Visit: Payer: Self-pay | Admitting: Family Medicine

## 2021-01-13 DIAGNOSIS — G8929 Other chronic pain: Secondary | ICD-10-CM

## 2021-01-13 DIAGNOSIS — M5442 Lumbago with sciatica, left side: Secondary | ICD-10-CM

## 2021-01-13 DIAGNOSIS — G4700 Insomnia, unspecified: Secondary | ICD-10-CM

## 2021-01-13 MED ORDER — TRAZODONE HCL 150 MG PO TABS: 150.0000 mg | ORAL_TABLET | Freq: Every evening | ORAL | 0 refills | Status: DC | PRN

## 2021-01-13 NOTE — Telephone Encounter (Signed)
Requested medication (s) are due for refill today: no  Requested medication (s) are on the active medication list: yes  Last refill:  12/23/20 #120 no RF  Future visit scheduled: yes  Notes to clinic:  med not delegated to NT to RF   Requested Prescriptions  Pending Prescriptions Disp Refills   oxyCODONE (ROXICODONE) 15 MG immediate release tablet 120 tablet     Sig: Take 1 tablet (15 mg total) by mouth 4 (four) times daily as needed for pain.     Not Delegated - Analgesics:  Opioid Agonists Failed - 01/13/2021 10:50 AM      Failed - This refill cannot be delegated      Failed - Urine Drug Screen completed in last 360 days      Failed - Valid encounter within last 6 months    Recent Outpatient Visits           1 year ago Syncope and collapse   [facility] [name], MD   1 year ago Primary hypertension   [facility] [name], MD   2 years ago Annual physical exam   [facility] [name], MD   2 years ago Onychomycosis   [facility] [name], MD   2 years ago Painful urination   [place], [place], [place]       Future Appointments             In 1 month Fisher, [name], MD [facility]            Signed Prescriptions Disp Refills   traZODone (DESYREL) 150 MG tablet 35 tablet 0    Sig: Take 1 tablet (150 mg total) by mouth at bedtime as needed for sleep. Pt given enough refill to last until upcoming appt     Psychiatry: Antidepressants - Serotonin Modulator Failed - 01/13/2021 10:50 AM      Failed - Valid encounter within last 6 months    Recent Outpatient Visits           1 year ago Syncope and collapse   [facility] [name], MD   1 year ago Primary hypertension   [facility] [name], MD   2 years ago Annual physical exam   [facility] [name], MD    2 years ago Onychomycosis   [facility] [name], MD   2 years ago Painful urination   [place], Cle[place], [place]       Future Appointments             In 1 month Fisher, [name], MD [facility], [place]

## 2021-01-13 NOTE — Telephone Encounter (Signed)
Requested Prescriptions  Pending Prescriptions Disp Refills   oxyCODONE (ROXICODONE) 15 MG immediate release tablet 120 tablet     Sig: Take 1 tablet (15 mg total) by mouth 4 (four) times daily as needed for pain.     Not Delegated - Analgesics:  Opioid Agonists Failed - 01/13/2021 10:50 AM      Failed - This refill cannot be delegated      Failed - Urine Drug Screen completed in last 360 days      Failed - Valid encounter within last 6 months    Recent Outpatient Visits          1 year ago Syncope and collapse   [facility] [name], MD   1 year ago Primary hypertension   [facility] [name], MD   2 years ago Annual physical exam   [facility] [name], MD   2 years ago Onychomycosis   [facility] [name], MD   2 years ago Painful urination   [place], Cle[place], [place]      Future Appointments            In 1 month Fisher, [name], MD [facility]            traZODone (DESYREL) 150 MG tablet 35 tablet 0    Sig: Take 1 tablet (150 mg total) by mouth at bedtime as needed for sleep. Pt given enough refill to last until upcoming appt     Psychiatry: Antidepressants - Serotonin Modulator Failed - 01/13/2021 10:50 AM      Failed - Valid encounter within last 6 months    Recent Outpatient Visits          1 year ago Syncope and collapse   [facility] [name], MD   1 year ago Primary hypertension   [facility] [name], MD   2 years ago Annual physical exam   [facility] [name], MD   2 years ago Onychomycosis   [facility] [name], MD   2 years ago Painful urination   [place], Clearnce [place], [place]      Future Appointments            In 1 month Fisher, [name], MD [facility] [place]

## 2021-01-13 NOTE — Telephone Encounter (Signed)
Medication Refill - Medication: oxyCODONE (ROXICODONE) 15 MG immediate release tablet traZODone (DESYREL) 150 MG tablet   Has the patient contacted their pharmacy? Yes.   (Agent: If no, request that the patient contact the pharmacy for the refill. If patient does not wish to contact the pharmacy document the reason why and proceed with request.) (Agent: If yes, when and what did the pharmacy advise?)  Preferred Pharmacy (with phone number or street name):  [facility] [place], [place] [address] AT [place]  [place] [address]  Phone: [phone] Fax: [phone]   Has the patient been seen for an appointment in the last year OR does the patient have an upcoming appointment? Yes.    Agent: Please be advised that RX refills may take up to 3 business days. We ask that you follow-up with your pharmacy.

## 2021-01-16 MED ORDER — OXYCODONE HCL 15 MG PO TABS: 15.0000 mg | ORAL_TABLET | Freq: Four times a day (QID) | ORAL | 0 refills | Status: DC | PRN

## 2021-01-28 ENCOUNTER — Ambulatory Visit (INDEPENDENT_AMBULATORY_CARE_PROVIDER_SITE_OTHER): Payer: Medicare HMO

## 2021-01-28 DIAGNOSIS — Z0000 Encounter for general adult medical examination without abnormal findings: Secondary | ICD-10-CM

## 2021-01-28 NOTE — Patient Instructions (Signed)
Justin Harrington , Thank you for taking time to come for your Medicare Wellness Visit. I appreciate your ongoing commitment to your health goals. Please review the following plan we discussed and let me know if I can assist you in the future.   Screening recommendations/referrals: Colonoscopy: cologuard 06/20/17 Recommended yearly ophthalmology/optometry visit for glaucoma screening and checkup Recommended yearly dental visit for hygiene and checkup  Vaccinations: Influenza vaccine: had at pharmacy this season Pneumococcal vaccine: 05/08/13 Tdap vaccine: n/d Shingles vaccine: n/d   Covid-19: 03/07/19, 04/25/19, 01/20/20  Advanced directives: no   Conditions/risks identified: no   Next appointment: Follow up in one year for your annual wellness visit. 02/02/21 @ 3pm   Preventive Care 74 Years and Older, Male Preventive care refers to lifestyle choices and visits with your health care provider that can promote health and wellness. What does preventive care include? A yearly physical exam. This is also called an annual well check. Dental exams once or twice a year. Routine eye exams. Ask your health care provider how often you should have your eyes checked. Personal lifestyle choices, including: Daily care of your teeth and gums. Regular physical activity. Eating a healthy diet. Avoiding tobacco and drug use. Limiting alcohol use. Practicing safe sex. Taking low doses of aspirin every day. Taking vitamin and mineral supplements as recommended by your health care provider. What happens during an annual well check? The services and screenings done by your health care provider during your annual well check will depend on your age, overall health, lifestyle risk factors, and family history of disease. Counseling  Your health care provider may ask you questions about your: Alcohol use. Tobacco use. Drug use. Emotional well-being. Home and relationship well-being. Sexual activity. Eating  habits. History of falls. Memory and ability to understand (cognition). Work and work Statistician. Screening  You may have the following tests or measurements: Height, weight, and BMI. Blood pressure. Lipid and cholesterol levels. These may be checked every 5 years, or more frequently if you are over 74 years old. Skin check. Lung cancer screening. You may have this screening every year starting at age 74 if you have a 30-pack-year history of smoking and currently smoke or have quit within the past 15 years. Fecal occult blood test (FOBT) of the stool. You may have this test every year starting at age 74. Flexible sigmoidoscopy or colonoscopy. You may have a sigmoidoscopy every 5 years or a colonoscopy every 10 years starting at age 74. Prostate cancer screening. Recommendations will vary depending on your family history and other risks. Hepatitis C blood test. Hepatitis B blood test. Sexually transmitted disease (STD) testing. Diabetes screening. This is done by checking your blood sugar (glucose) after you have not eaten for a while (fasting). You may have this done every 1-3 years. Abdominal aortic aneurysm (AAA) screening. You may need this if you are a current or former smoker. Osteoporosis. You may be screened starting at age 74 if you are at high risk. Talk with your health care provider about your test results, treatment options, and if necessary, the need for more tests. Vaccines  Your health care provider may recommend certain vaccines, such as: Influenza vaccine. This is recommended every year. Tetanus, diphtheria, and acellular pertussis (Tdap, Td) vaccine. You may need a Td booster every 10 years. Zoster vaccine. You may need this after age 74. Pneumococcal 13-valent conjugate (PCV13) vaccine. One dose is recommended after age 74. Pneumococcal polysaccharide (PPSV23) vaccine. One dose is recommended after age 14.  Talk to your health care provider about which screenings and  vaccines you need and how often you need them. This information is not intended to replace advice given to you by your health care provider. Make sure you discuss any questions you have with your health care provider. Document Released: 02/14/2015 Document Revised: 10/08/2015 Document Reviewed: 11/19/2014 Elsevier Interactive Patient Education  2017 [place] Prevention in the Home Falls can cause injuries. They can happen to people of all ages. There are many things you can do to make your home safe and to help prevent falls. What can I do on the outside of my home? Regularly fix the edges of walkways and driveways and fix any cracks. Remove anything that might make you trip as you walk through a door, such as a raised step or threshold. Trim any bushes or trees on the path to your home. Use bright outdoor lighting. Clear any walking paths of anything that might make someone trip, such as rocks or tools. Regularly check to see if handrails are loose or broken. Make sure that both sides of any steps have handrails. Any raised decks and porches should have guardrails on the edges. Have any leaves, snow, or ice cleared regularly. Use sand or salt on walking paths during winter. Clean up any spills in your garage right away. This includes oil or grease spills. What can I do in the bathroom? Use night lights. Install grab bars by the toilet and in the tub and shower. Do not use towel bars as grab bars. Use non-skid mats or decals in the tub or shower. If you need to sit down in the shower, use a plastic, non-slip stool. Keep the floor dry. Clean up any water that spills on the floor as soon as it happens. Remove soap buildup in the tub or shower regularly. Attach bath mats securely with double-sided non-slip rug tape. Do not have throw rugs and other things on the floor that can make you trip. What can I do in the bedroom? Use night lights. Make sure that you have a light by your  bed that is easy to reach. Do not use any sheets or blankets that are too big for your bed. They should not hang down onto the floor. Have a firm chair that has side arms. You can use this for support while you get dressed. Do not have throw rugs and other things on the floor that can make you trip. What can I do in the kitchen? Clean up any spills right away. Avoid walking on wet floors. Keep items that you use a lot in easy-to-reach places. If you need to reach something above you, use a strong step stool that has a grab bar. Keep electrical cords out of the way. Do not use floor polish or wax that makes floors slippery. If you must use wax, use non-skid floor wax. Do not have throw rugs and other things on the floor that can make you trip. What can I do with my stairs? Do not leave any items on the stairs. Make sure that there are handrails on both sides of the stairs and use them. Fix handrails that are broken or loose. Make sure that handrails are as long as the stairways. Check any carpeting to make sure that it is firmly attached to the stairs. Fix any carpet that is loose or worn. Avoid having throw rugs at the top or bottom of the stairs. If you do have throw  rugs, attach them to the floor with carpet tape. Make sure that you have a light switch at the top of the stairs and the bottom of the stairs. If you do not have them, ask someone to add them for you. What else can I do to help prevent falls? Wear shoes that: Do not have high heels. Have rubber bottoms. Are comfortable and fit you well. Are closed at the toe. Do not wear sandals. If you use a stepladder: Make sure that it is fully opened. Do not climb a closed stepladder. Make sure that both sides of the stepladder are locked into place. Ask someone to hold it for you, if possible. Clearly mark and make sure that you can see: Any grab bars or handrails. First and last steps. Where the edge of each step is. Use tools that  help you move around (mobility aids) if they are needed. These include: Canes. Walkers. Scooters. Crutches. Turn on the lights when you go into a dark area. Replace any light bulbs as soon as they burn out. Set up your furniture so you have a clear path. Avoid moving your furniture around. If any of your floors are uneven, fix them. If there are any pets around you, be aware of where they are. Review your medicines with your doctor. Some medicines can make you feel dizzy. This can increase your chance of falling. Ask your doctor what other things that you can do to help prevent falls. This information is not intended to replace advice given to you by your health care provider. Make sure you discuss any questions you have with your health care provider. Document Released: 11/14/2008 Document Revised: 06/26/2015 Document Reviewed: 02/22/2014 Elsevier Interactive Patient Education  2017 [employer].

## 2021-01-28 NOTE — Progress Notes (Addendum)
Virtual Visit via Telephone Note  I connected with  Justin Harrington on 01/28/21 at  3:40 PM EST by telephone and verified that I am speaking with the correct person using two identifiers.  Location: Patient: home Provider: [facility] Persons participating in the virtual visit: [place]   I discussed the limitations, risks, security and privacy concerns of performing an evaluation and management service by telephone and the availability of in person appointments. The patient expressed understanding and agreed to proceed.  Interactive audio and video telecommunications were attempted between this nurse and patient, however failed, due to patient having technical difficulties OR patient did not have access to video capability.  We continued and completed visit with audio only.  Some vital signs may be absent or patient reported.   Justin David, LPN  Subjective:   Justin Harrington is a 75 y.o. male who presents for Medicare Annual/Subsequent preventive examination.  Review of Systems           Objective:    There were no vitals filed for this visit. There is no height or weight on file to calculate BMI.  Advanced Directives 01/28/2021 01/28/2020 12/12/2019 07/04/2018 03/29/2018 03/29/2018 03/27/2018  Does Patient Have a Medical Advance Directive? No Yes No Yes Yes Yes No  Type of Advance Directive - [place];Living will - [place];Living will [place];Living will - -  Does patient want to make changes to medical advance directive? - - - - No - Patient declined No - Patient declined -  Copy of [place] in Chart? - No - copy requested - No - copy requested No - copy requested - -  Would patient like information on creating a medical advance directive? No - Patient declined - - - - No - Patient declined No - Patient declined    Current Medications (verified) Outpatient Encounter  Medications as of 01/28/2021  Medication Sig   acetaminophen (TYLENOL) 325 MG tablet Take 1-2 tablets (325-650 mg total) by mouth every 6 (six) hours as needed for mild pain (or temp >/= 101 F).   hydrochlorothiazide (HYDRODIURIL) 25 MG tablet Take 1 tablet (25 mg total) by mouth daily.   oxyCODONE (ROXICODONE) 15 MG immediate release tablet Take 1 tablet (15 mg total) by mouth 4 (four) times daily as needed for pain.   traZODone (DESYREL) 150 MG tablet Take 1 tablet (150 mg total) by mouth at bedtime as needed for sleep. Pt given enough refill to last until upcoming appt   atorvastatin (LIPITOR) 80 MG tablet TAKE 1 TABLET(80 MG) BY MOUTH DAILY (Patient not taking: Reported on 01/28/2021)   clopidogrel (PLAVIX) 75 MG tablet Take 1 tablet (75 mg total) by mouth daily. (Patient not taking: Reported on 01/28/2021)   lubiprostone (AMITIZA) 24 MCG capsule Take 1 capsule (24 mcg total) by mouth 2 (two) times daily with a meal. (Patient not taking: Reported on 01/28/2020)   polyethylene glycol (MIRALAX / GLYCOLAX) 17 g packet Take 17 g by mouth daily as needed. (Patient not taking: Reported on 01/28/2021)   Sennosides (EX-LAX PO) Take 3-4 tablets by mouth every 3 (three) days as needed (constipation).  (Patient not taking: Reported on 01/28/2021)   No facility-administered encounter medications on file as of 01/28/2021.    Allergies (verified) Zolpidem   History: Past Medical History:  Diagnosis Date   Aortic aneurysm ([place])    Coronary artery disease    DVT (deep venous thrombosis) ([place])  H/O DVT in left leg, filter placed approximatly 8 yrs ago   H/O spinal fusion 1998   Back pain   Hyperlipidemia    Hypertension    controlled on meds   Personal history of tobacco use, presenting hazards to health 06/12/2015   Personal history of tobacco use, presenting hazards to health 06/12/2015   Prostate cancer [facility])    Had prostatectomy   Wears dentures    upper   Past Surgical History:   Procedure Laterality Date   BACK SURGERY  1998   Lumbar spine fusion   carotid doppler ultrasound  10/05/2011   75-90% RCA occlusion, 50% on left   CAROTID ENDARTERECTOMY Right 12/02/2011   Dr. Lucky Cowboy   CHOLECYSTECTOMY  10/02/2010   Laparoscopic, Dr. Pat Patrick, [place] WITH PROPOFOL N/A 06/20/2017   Procedure: COLONOSCOPY WITH PROPOFOL;  Surgeon: [name], MD;  Location: [facility];  Service: Endoscopy;  Laterality: N/A;   CT SCAN  10/02/2010   [facility]; Infrarenal suprailiac abdominal aortic aneurysm maximum dimention of nearly 4cm. Cholelithiasis and acute cholecystiis. Mild enlargement of left adrenal gland   ENDOVASCULAR REPAIR/STENT GRAFT N/A 03/29/2018   Procedure: ENDOVASCULAR REPAIR/STENT GRAFT;  Surgeon: Algernon Huxley, MD;  Location: [facility];  Service: Cardiovascular;  Laterality: N/A;   LUMBAR SPINE HARDWARE REMOVAL  07/30/2016   [name], MD   PROSTATE SURGERY  10/19/2012   prostatectomy. [facility]; Laser assisted, Done by [name] for prostate cancer   Family History  Problem Relation Age of Onset   Heart attack Mother    Congestive Heart Failure Father    Cerebral palsy Son    Social History   Socioeconomic History   Marital status: Married    Spouse name: Not on file   Number of children: 2   Years of education: HS Diploma   Highest education level: 12th grade  Occupational History   Occupation: Retired  Tobacco Use   Smoking status: Every Day    Packs/day: 0.50    Years: 50.00    Pack years: 25.00    Types: Cigarettes   Smokeless tobacco: Never  Vaping Use   Vaping Use: Never used  Substance and Sexual Activity   Alcohol use: No    Alcohol/week: 0.0 standard drinks   Drug use: No   Sexual activity: Not on file  Other Topics Concern   Not on file  Social History Narrative   Son passed at age 32 from cerebral palsy.   Social Determinants of Health   Financial Resource Strain: Low Risk    Difficulty of  Paying Living Expenses: Not hard at all  Food Insecurity: No Food Insecurity   Worried About Charity fundraiser in the Last Year: Never true   [place] in the Last Year: Never true  Transportation Needs: No Transportation Needs   Lack of Transportation (Medical): No   Lack of Transportation (Non-Medical): No  Physical Activity: Inactive   Days of Exercise per Week: 0 days   Minutes of Exercise per Session: 0 min  Stress: No Stress Concern Present   Feeling of Stress : Not at all  Social Connections: Moderately Integrated   Frequency of Communication with Friends and Family: Three times a week   Frequency of Social Gatherings with Friends and Family: More than three times a week   Attends Religious Services: More than 4 times per year   Active Member of Clubs or Organizations: No  Attends Archivist Meetings: Never   Marital Status: Married    Tobacco Counseling Ready to quit: Not Answered Counseling given: Not Answered   Clinical Intake:  Pre-visit preparation completed: Yes  Pain : No/denies pain     Nutritional Risks: None Diabetes: No  How often do you need to have someone help you when you read instructions, pamphlets, or other written materials from your doctor or pharmacy?: 1 - Never  Diabetic?no  Interpreter Needed?: No  Information entered by :: Kirke Shaggy, LPN   Activities of Daily Living No flowsheet data found.  Patient Care Team: [name], MD as PCP - General (Family Medicine) Lucky Cowboy Erskine Squibb, MD as Referring Physician (Vascular Surgery) Pa, Natural Steps as Consulting Physician [facility]) Ubaldo Glassing, Javier Docker, MD as Consulting Physician (Cardiology)  Indicate any recent Medical Services you may have received from other than [facility] providers in the past year (date may be approximate).     Assessment:   This is a routine wellness examination for [place].  Hearing/Vision screen No results found.  Dietary issues and  exercise activities discussed:     Goals Addressed             This Visit's Progress    DIET - EAT MORE FRUITS AND VEGETABLES         Depression Screen PHQ 2/9 Scores 01/28/2021 11/27/2019 07/04/2018 07/04/2018 04/26/2017 06/07/2016 06/03/2016  PHQ - 2 Score 0 0 0 0 0 0 0  PHQ- 9 Score - 2 2 - - - -  Exception Documentation - - - - - - -    Fall Risk Fall Risk  01/28/2021 01/28/2020 11/27/2019 08/28/2019 07/04/2018  Falls in the past year? 0 1 1 0 0  Comment - - - Emmi Telephone Survey: data to providers prior to load -  Number falls in past yr: 0 0 0 - -  Injury with Fall? 0 0 0 - -  Risk Factor Category  - - - - -  Risk for fall due to : No Fall Risks - - - -  Follow up Falls evaluation completed Falls prevention discussed - - -  Comment - - - - -    FALL RISK PREVENTION PERTAINING TO THE HOME:  Any stairs in or around the home? Yes  If so, are there any without handrails? No  Home free of loose throw rugs in walkways, pet beds, electrical cords, etc? Yes  Adequate lighting in your home to reduce risk of falls? Yes   ASSISTIVE DEVICES UTILIZED TO PREVENT FALLS:  Life alert? No  Use of a cane, walker or w/c? No  Grab bars in the bathroom? Yes  Shower chair or bench in shower? Yes  Elevated toilet seat or a handicapped toilet? No     Cognitive Function:Normal cognitive status assessed by direct observation by this Nurse Health Advisor. No abnormalities found.          Immunizations Immunization History  Administered Date(s) Administered   Influenza, High Dose Seasonal PF 10/12/2016, 11/03/2017, 09/18/2018, 11/20/2019   Influenza-Unspecified 12/01/2014   Pneumococcal Conjugate-13 05/08/2013   Pneumococcal Polysaccharide-23 10/02/2010    TDAP status: Due, Education has been provided regarding the importance of this vaccine. Advised may receive this vaccine at local pharmacy or Health Dept. Aware to provide a copy of the vaccination record if obtained from local  pharmacy or Health Dept. Verbalized acceptance and understanding.  Flu Vaccine status: Up to date  Pneumococcal vaccine status: Up to  date  Covid-19 vaccine status: Completed vaccines  Qualifies for Shingles Vaccine? Yes   Zostavax completed No   Shingrix Completed?: No.    Education has been provided regarding the importance of this vaccine. Patient has been advised to call insurance company to determine out of pocket expense if they have not yet received this vaccine. Advised may also receive vaccine at local pharmacy or Health Dept. Verbalized acceptance and understanding.  Screening Tests Health Maintenance  Topic Date Due   TETANUS/TDAP  Never done   Zoster Vaccines- Shingrix (1 of 2) Never done   Pneumonia Vaccine 76+ Years old (3 - PPSV23 if available, else PCV20) 10/02/2015   COVID-19 Vaccine (4 - Booster for Pfizer series) 03/16/2020   Fecal DNA (Cologuard)  05/16/2020   INFLUENZA VACCINE  09/01/2020   Hepatitis C Screening  Completed   HPV VACCINES  Aged Out    Health Maintenance  Health Maintenance Due  Topic Date Due   TETANUS/TDAP  Never done   Zoster Vaccines- Shingrix (1 of 2) Never done   Pneumonia Vaccine 82+ Years old (3 - PPSV23 if available, else PCV20) 10/02/2015   COVID-19 Vaccine (4 - Booster for Pfizer series) 03/16/2020   Fecal DNA (Cologuard)  05/16/2020   INFLUENZA VACCINE  09/01/2020    Colorectal cancer screening: Type of screening: Cologuard. Completed 06/20/17. Repeat every 3 years  Lung Cancer Screening: (Low Dose CT Chest recommended if Age 23-80 years, 30 pack-year currently smoking OR have quit w/in 15years.) does not qualify.    Additional Screening:  Hepatitis C Screening: does qualify; Completed 03/15/16  Vision Screening: Recommended annual ophthalmology exams for early detection of glaucoma and other disorders of the eye. Is the patient up to date with their annual eye exam?  No  Who is the provider or what is the name of the  office in which the patient attends annual eye exams? none If pt is not established with a provider, would they like to be referred to a provider to establish care? No .   Dental Screening: Recommended annual dental exams for proper oral hygiene  Community Resource Referral / Chronic Care Management: CRR required this visit?  No   CCM required this visit?  No      Plan:     I have personally reviewed and noted the following in the patients chart:   Medical and social history Use of alcohol, tobacco or illicit drugs  Current medications and supplements including opioid prescriptions. Patient is currently taking opioid prescriptions. Information provided to patient regarding non-opioid alternatives. Patient advised to discuss non-opioid treatment plan with their provider. Functional ability and status Nutritional status Physical activity Advanced directives List of other physicians Hospitalizations, surgeries, and ER visits in previous 12 months Vitals Screenings to include cognitive, depression, and falls Referrals and appointments  In addition, I have reviewed and discussed with patient certain preventive protocols, quality metrics, and best practice recommendations. A written personalized care plan for preventive services as well as general preventive health recommendations were provided to patient.     Justin David, LPN   [id number]   Nurse Notes: none   I have reviewed the health advisor's note, was available for consultation, and agree with documentation and plan  [name], MD

## 2021-02-09 ENCOUNTER — Other Ambulatory Visit: Payer: Self-pay | Admitting: Family Medicine

## 2021-02-09 DIAGNOSIS — M5442 Lumbago with sciatica, left side: Secondary | ICD-10-CM

## 2021-02-09 DIAGNOSIS — G8929 Other chronic pain: Secondary | ICD-10-CM

## 2021-02-09 MED ORDER — OXYCODONE HCL 15 MG PO TABS: 15.0000 mg | ORAL_TABLET | Freq: Four times a day (QID) | ORAL | 0 refills | Status: DC | PRN

## 2021-02-09 NOTE — Telephone Encounter (Signed)
Medication Refill - Medication: oxyCODONE (ROXICODONE) 15 MG immediate release tablet  Has the patient contacted their pharmacy? Yes.   (Agent: If no, request that the patient contact the pharmacy for the refill. If patient does not wish to contact the pharmacy document the reason why and proceed with request.) (Agent: If yes, when and what did the pharmacy advise?)  Preferred Pharmacy (with phone number or street name):  [facility] [place], [place] [address] AT [place]  [place] [address]  Phone: [phone] Fax: [phone]   Has the patient been seen for an appointment in the last year OR does the patient have an upcoming appointment? Yes.    Agent: Please be advised that RX refills may take up to 3 business days. We ask that you follow-up with your pharmacy.

## 2021-02-09 NOTE — Telephone Encounter (Signed)
Requested medications are due for refill today.  yes  Requested medications are on the active medications list.  yes  Last refill. 01/16/2021  Future visit scheduled.   yes  Notes to clinic.  Medication not delegated.    Requested Prescriptions  Pending Prescriptions Disp Refills   oxyCODONE (ROXICODONE) 15 MG immediate release tablet 120 tablet 0    Sig: Take 1 tablet (15 mg total) by mouth 4 (four) times daily as needed for pain.     Not Delegated - Analgesics:  Opioid Agonists Failed - 02/09/2021 11:28 AM      Failed - This refill cannot be delegated      Failed - Urine Drug Screen completed in last 360 days      Passed - Valid encounter within last 6 months    Recent Outpatient Visits           1 year ago Syncope and collapse   [facility] [name], MD   1 year ago Primary hypertension   [facility] [name], MD   2 years ago Annual physical exam   [facility] [name], MD   2 years ago Onychomycosis   [facility] [name], MD   2 years ago Painful urination   [place], [place], [place]       Future Appointments             In 1 week Caryn Section, [name], MD [facility], [place]

## 2021-02-16 ENCOUNTER — Ambulatory Visit: Payer: Medicare HMO | Admitting: Family Medicine

## 2021-02-16 ENCOUNTER — Telehealth: Payer: Self-pay | Admitting: Acute Care

## 2021-02-16 NOTE — Telephone Encounter (Signed)
Attempted to contact pt. Left detailed message for pt to call back to schedule f/u lung screening CT.

## 2021-02-20 ENCOUNTER — Ambulatory Visit: Payer: Medicare HMO | Admitting: Family Medicine

## 2021-02-20 NOTE — Progress Notes (Deleted)
Established patient visit   Patient: Justin Harrington   DOB: Jun 21, 1945   76 y.o. Male  MRN: [id number] Visit Date: 02/20/2021  Today's healthcare provider: [name], MD   No chief complaint on file.  Subjective    HPI  Follow up for chronic back pain:  The patient was last seen for this more than 1 year ago . Changes made at last visit include none. Pain was well controlled with medications.    He reports {excellent/good/fair/poor:19665} compliance with treatment. He feels that condition is {improved/worse/unchanged:3041574}. He {is/is not:21021397} having side effects. ***  -----------------------------------------------------------------------------------------   Follow up for insomnia:  The patient was last seen for this more than 1 year ago.   Changes made at last visit include none; continue Trazodone.  He reports {excellent/good/fair/poor:19665} compliance with treatment. He feels that condition is {improved/worse/unchanged:3041574}. He {is/is not:21021397} having side effects. ***  -----------------------------------------------------------------------------------------   Hypertension, follow-up  BP Readings from Last 3 Encounters:  12/21/19 (!) 155/81  12/19/19 (!) 146/73  12/13/19 (!) 151/82   Wt Readings from Last 3 Encounters:  12/21/19 157 lb (71.2 kg)  12/19/19 153 lb (69.4 kg)  12/12/19 155 lb (70.3 kg)     He was last seen for hypertension 1  year  ago.   Management since that visit includes continue same medication.  He reports {excellent/good/fair/poor:19665} compliance with treatment. He {is/is not:9024} having side effects. {document side effects if present:1} He is following a {diet:21022986} diet. He {is/is not:9024} exercising. He {does/does not:200015} smoke.  Use of agents associated with hypertension: {bp agents assoc with hypertension:511::"none"}.   Outside blood pressures are {***enter patient reported home BP  readings, or 'not being checked':1}. Symptoms: {Yes/No:20286} chest pain {Yes/No:20286} chest pressure  {Yes/No:20286} palpitations {Yes/No:20286} syncope  {Yes/No:20286} dyspnea {Yes/No:20286} orthopnea  {Yes/No:20286} paroxysmal nocturnal dyspnea {Yes/No:20286} lower extremity edema   Pertinent labs: Lab Results  Component Value Date   CHOL 127 11/27/2019   HDL 43 11/27/2019   LDLCALC 65 11/27/2019   TRIG 105 11/27/2019   CHOLHDL 3.0 11/27/2019   Lab Results  Component Value Date   NA 138 12/13/2019   K 3.6 12/13/2019   CREATININE 1.35 (H) 12/13/2019   GFRNONAA 55 (L) 12/13/2019   GLUCOSE 83 12/13/2019     The ASCVD Risk score (Arnett DK, et al., 2019) failed to calculate for the following reasons:   The valid total cholesterol range is 130 to 320 mg/dL   ---------------------------------------------------------------------------------------------------   Lipid/Cholesterol, Follow-up  Last lipid panel Other pertinent labs  Lab Results  Component Value Date   CHOL 127 11/27/2019   HDL 43 11/27/2019   LDLCALC 65 11/27/2019   TRIG 105 11/27/2019   CHOLHDL 3.0 11/27/2019   Lab Results  Component Value Date   ALT 10 11/27/2019   AST 17 11/27/2019   PLT 148 (L) 12/13/2019     He was last seen for this more than 1 year ago.   Management since that visit includes continuing same medications.  He reports {excellent/good/fair/poor:19665} compliance with treatment. He {is/is not:9024} having side effects. {document side effects if present:1}  Symptoms: {Yes/No:20286} chest pain {Yes/No:20286} chest pressure/discomfort  {Yes/No:20286} dyspnea {Yes/No:20286} lower extremity edema  {Yes/No:20286} numbness or tingling of extremity {Yes/No:20286} orthopnea  {Yes/No:20286} palpitations {Yes/No:20286} paroxysmal nocturnal dyspnea  {Yes/No:20286} speech difficulty {Yes/No:20286} syncope   Current diet: {diet habits:16563} Current exercise: {exercise types:16438}  The  ASCVD Risk score (Arnett DK, et al., 2019) failed to calculate for the following reasons:  The valid total cholesterol range is 130 to 320 mg/dL  ---------------------------------------------------------------------------------------------------   Medications: Outpatient Medications Prior to Visit  Medication Sig   acetaminophen (TYLENOL) 325 MG tablet Take 1-2 tablets (325-650 mg total) by mouth every 6 (six) hours as needed for mild pain (or temp >/= 101 F).   atorvastatin (LIPITOR) 80 MG tablet TAKE 1 TABLET(80 MG) BY MOUTH DAILY (Patient not taking: Reported on 01/28/2021)   clopidogrel (PLAVIX) 75 MG tablet Take 1 tablet (75 mg total) by mouth daily. (Patient not taking: Reported on 01/28/2021)   hydrochlorothiazide (HYDRODIURIL) 25 MG tablet Take 1 tablet (25 mg total) by mouth daily.   lubiprostone (AMITIZA) 24 MCG capsule Take 1 capsule (24 mcg total) by mouth 2 (two) times daily with a meal. (Patient not taking: Reported on 01/28/2020)   oxyCODONE (ROXICODONE) 15 MG immediate release tablet Take 1 tablet (15 mg total) by mouth 4 (four) times daily as needed for pain.   polyethylene glycol (MIRALAX / GLYCOLAX) 17 g packet Take 17 g by mouth daily as needed. (Patient not taking: Reported on 01/28/2021)   Sennosides (EX-LAX PO) Take 3-4 tablets by mouth every 3 (three) days as needed (constipation).  (Patient not taking: Reported on 01/28/2021)   traZODone (DESYREL) 150 MG tablet Take 1 tablet (150 mg total) by mouth at bedtime as needed for sleep. Pt given enough refill to last until upcoming appt   No facility-administered medications prior to visit.    Review of Systems  {Labs   Heme   Chem   Endocrine   Serology   Results Review (optional):23779}   Objective    There were no vitals taken for this visit. {Show previous vital signs (optional):23777}  Physical Exam  ***  No results found for any visits on 02/20/21.  Assessment & Plan     ***  No follow-ups on file.       {provider attestation***:1}   [name], MD  [facility] [phone] (phone) [phone] (fax)  [place]

## 2021-03-05 ENCOUNTER — Telehealth: Payer: Self-pay | Admitting: Family Medicine

## 2021-03-05 ENCOUNTER — Other Ambulatory Visit: Payer: Self-pay | Admitting: Family Medicine

## 2021-03-05 DIAGNOSIS — G8929 Other chronic pain: Secondary | ICD-10-CM

## 2021-03-05 DIAGNOSIS — E7800 Pure hypercholesterolemia, unspecified: Secondary | ICD-10-CM

## 2021-03-05 DIAGNOSIS — M5441 Lumbago with sciatica, right side: Secondary | ICD-10-CM

## 2021-03-05 NOTE — Telephone Encounter (Signed)
Requested medications are due for refill today.  yes  Requested medications are on the active medications list.  yes  Last refill. 12/11/2019 #90 4 refills  Future visit scheduled.   no  Notes to clinic.  Failed protocol d/t expired labs. Pt last seen 12/21/2019. Pt has missed several scheduled appointments.    Requested Prescriptions  Pending Prescriptions Disp Refills   atorvastatin (LIPITOR) 80 MG tablet [Pharmacy Med Name: ATORVASTATIN 80MG  TABLETS] 90 tablet 4    Sig: TAKE 1 TABLET(80 MG) BY MOUTH DAILY     Cardiovascular:  Antilipid - Statins Failed - 03/05/2021  5:49 PM      Failed - Lipid Panel in normal range within the last 12 months    Cholesterol, Total  Date Value Ref Range Status  11/27/2019 127 100 - 199 mg/dL Final   LDL Chol Calc (NIH)  Date Value Ref Range Status  11/27/2019 65 0 - 99 mg/dL Final   HDL  Date Value Ref Range Status  11/27/2019 43 >39 mg/dL Final   Triglycerides  Date Value Ref Range Status  11/27/2019 105 0 - 149 mg/dL Final         Passed - Patient is not pregnant      Passed - Valid encounter within last 12 months    Recent Outpatient Visits           1 year ago Syncope and collapse   [facility] [name], MD   1 year ago Primary hypertension   [facility] [name], MD   2 years ago Annual physical exam   [facility] [name], MD   2 years ago Onychomycosis   [facility] [name], MD   2 years ago Painful urination   [facility] [place], [place]

## 2021-03-05 NOTE — Telephone Encounter (Signed)
Requested medication (s) are due for refill today: yes  Requested medication (s) are on the active medication list: yes  Last refill:  02/09/21 #120 0 refills  Future visit scheduled: no  Notes to clinic:  not delegated per protocol. Do you want to refill Rx?     Requested Prescriptions  Pending Prescriptions Disp Refills   oxyCODONE (ROXICODONE) 15 MG immediate release tablet 120 tablet 0    Sig: Take 1 tablet (15 mg total) by mouth 4 (four) times daily as needed for pain.     Not Delegated - Analgesics:  Opioid Agonists Failed - 03/05/2021 11:05 AM      Failed - This refill cannot be delegated      Failed - Urine Drug Screen completed in last 360 days      Passed - Valid encounter within last 3 months    Recent Outpatient Visits           1 year ago Syncope and collapse   [facility] [name], MD   1 year ago Primary hypertension   [facility] [name], MD   2 years ago Annual physical exam   [facility] [name], MD   2 years ago Onychomycosis   [facility] [name], MD   2 years ago Painful urination   [facility] [place], [place]

## 2021-03-05 NOTE — Telephone Encounter (Signed)
Copied from [place] [id number]. Topic: Quick Communication - Rx Refill/Question >> Mar 05, 2021 10:21 AM Leward Quan A wrote: Medication: oxyCODONE (ROXICODONE) 15 MG immediate release tablet   Has the patient contacted their pharmacy? No. Has to call office  (Agent: If no, request that the patient contact the pharmacy for the refill. If patient does not wish to contact the pharmacy document the reason why and proceed with request.) (Agent: If yes, when and what did the pharmacy advise?)  Preferred Pharmacy (with phone number or street name): [facility] #[id number] - [place], [place] [address] AT [place]  Phone:  [phone] Fax:  [phone]    Has the patient been seen for an appointment in the last year OR does the patient have an upcoming appointment? Yes.    Agent: Please be advised that RX refills may take up to 3 business days. We ask that you follow-up with your pharmacy.

## 2021-03-06 NOTE — Telephone Encounter (Signed)
30 days dispensed 02-13-21

## 2021-03-11 MED ORDER — OXYCODONE HCL 15 MG PO TABS: 15.0000 mg | ORAL_TABLET | Freq: Four times a day (QID) | ORAL | 0 refills | Status: DC | PRN

## 2021-03-11 NOTE — Telephone Encounter (Signed)
Pt calling back to follow up on Rx request for oxycodone. Pt states his bottle says 02/09/2021. Pt is going out of town and needs to take his pain medicine with him. Pt would appreciate Dr Caryn Section taking another look at his Rx.

## 2021-03-11 NOTE — Telephone Encounter (Signed)
Prescription has been sent into pharmacy today by Dr. Caryn Section. I did call pharmacy to ask when patient last had medication filled. Pharmacist states that the medication was filled by pharmacy on 02/09/2021, but patient didn't pick up prescription until 02/13/2021. Pharmacist states that it is too early to fill this prescription since the dispense date was 02/13/2021. Pharmacy states that the earliest that this prescription can be filled is on 03/13/2021. Tried calling patient. Left message to call back. OK for [facility] to advise.

## 2021-03-30 ENCOUNTER — Other Ambulatory Visit: Payer: Self-pay

## 2021-03-30 ENCOUNTER — Ambulatory Visit (INDEPENDENT_AMBULATORY_CARE_PROVIDER_SITE_OTHER): Payer: Medicare HMO | Admitting: Family Medicine

## 2021-03-30 ENCOUNTER — Encounter: Payer: Self-pay | Admitting: Family Medicine

## 2021-03-30 VITALS — BP 197/99 | HR 85 | Temp 98.0°F | Resp 14 | Wt 165.0 lb

## 2021-03-30 DIAGNOSIS — E7800 Pure hypercholesterolemia, unspecified: Secondary | ICD-10-CM | POA: Diagnosis not present

## 2021-03-30 DIAGNOSIS — Z9889 Other specified postprocedural states: Secondary | ICD-10-CM | POA: Diagnosis not present

## 2021-03-30 DIAGNOSIS — G894 Chronic pain syndrome: Secondary | ICD-10-CM | POA: Diagnosis not present

## 2021-03-30 DIAGNOSIS — I779 Disorder of arteries and arterioles, unspecified: Secondary | ICD-10-CM

## 2021-03-30 DIAGNOSIS — F1190 Opioid use, unspecified, uncomplicated: Secondary | ICD-10-CM | POA: Diagnosis not present

## 2021-03-30 DIAGNOSIS — Z8679 Personal history of other diseases of the circulatory system: Secondary | ICD-10-CM

## 2021-03-30 DIAGNOSIS — Z981 Arthrodesis status: Secondary | ICD-10-CM

## 2021-03-30 DIAGNOSIS — I10 Essential (primary) hypertension: Secondary | ICD-10-CM | POA: Diagnosis not present

## 2021-03-30 MED ORDER — AMLODIPINE BESYLATE 2.5 MG PO TABS: 2.5000 mg | ORAL_TABLET | Freq: Every day | ORAL | 1 refills | Status: DC

## 2021-03-30 NOTE — Patient Instructions (Addendum)
Please review the attached list of medications and notify my office if there are any errors.   Call [facility] at [phone] to schedule a recheck of your aortic aneurysm graft and carotid arteries.

## 2021-03-30 NOTE — Progress Notes (Signed)
I,Roshena L Chambers,acting as a scribe for [name], MD.,have documented all relevant documentation on the behalf of [name], MD,as directed by  [name], MD while in the presence of [name], MD.   Established patient visit   Patient: Justin Harrington   DOB: August 22, 1945   75 y.o. Male  MRN: [id number] Visit Date: 03/30/2021  Today's healthcare provider: [name], MD   Chief Complaint  Patient presents with   Pain Management   Hypertension   Hyperlipidemia   Insomnia   Subjective    HPI  Follow up for chronic back pain:   The patient was last seen for this more than 1 year ago . Changes made at last visit include none. Pain was well controlled with medications.     He reports good compliance with treatment. He feels that condition is Unchanged. He is not having side effects.    -----------------------------------------------------------------------------------------    Follow up for insomnia:   The patient was last seen for this more than 1 year ago.   Changes made at last visit include none; continue Trazodone.   He reports good compliance with treatment. He feels that condition is Unchanged. He is not having side effects.    -----------------------------------------------------------------------------------------    Hypertension, follow-up      BP Readings from Last 3 Encounters:  12/21/19 (!) 155/81  12/19/19 (!) 146/73  12/13/19 (!) 151/82       Wt Readings from Last 3 Encounters:  12/21/19 157 lb (71.2 kg)  12/19/19 153 lb (69.4 kg)  12/12/19 155 lb (70.3 kg)       He was last seen for hypertension 1  year  ago.   Management since that visit includes continue same medication.   He reports good compliance with treatment. He is not having side effects.  He is following a Regular diet. He is exercising. He does smoke.   Use of agents associated with hypertension: none.    Outside blood pressures are occasionally  checked. Symptoms: No chest pain No chest pressure  No palpitations No syncope  No dyspnea No orthopnea  No paroxysmal nocturnal dyspnea No lower extremity edema      The ASCVD Risk score (Arnett DK, et al., 2019) failed to calculate for the following reasons:   The valid total cholesterol range is 130 to 320 mg/dL    ---------------------------------------------------------------------------------------------------    Lipid/Cholesterol, Follow-up   Last lipid panel Other pertinent labs  Recent Labs       Lab Results  Component Value Date    CHOL 127 11/27/2019    HDL 43 11/27/2019    LDLCALC 65 11/27/2019    TRIG 105 11/27/2019    CHOLHDL 3.0 11/27/2019      Recent Labs       Lab Results  Component Value Date    ALT 10 11/27/2019    AST 17 11/27/2019    PLT 148 (L) 12/13/2019         He was last seen for this more than 1 year ago.   Management since that visit includes continuing same medications.   He reports good compliance with treatment. He is not having side effects.    Symptoms: No chest pain No chest pressure/discomfort  No dyspnea No lower extremity edema  No numbness or tingling of extremity No orthopnea  No palpitations No paroxysmal nocturnal dyspnea  No speech difficulty No syncope    Current diet: well balanced Current exercise: yard work  The ASCVD Risk score (Arnett DK, et al., 2019) failed to calculate for the following reasons:   The valid total cholesterol range is 130 to 320 mg/dL     Medications: Outpatient Medications Prior to Visit  Medication Sig   atorvastatin (LIPITOR) 80 MG tablet TAKE 1 TABLET(80 MG) BY MOUTH DAILY.   hydrochlorothiazide (HYDRODIURIL) 25 MG tablet Take 1 tablet (25 mg total) by mouth daily.   oxyCODONE (ROXICODONE) 15 MG immediate release tablet Take 1 tablet (15 mg total) by mouth 4 (four) times daily as needed for pain.   traZODone (DESYREL) 150 MG tablet Take 1 tablet (150 mg total) by mouth at bedtime as  needed for sleep. Pt given enough refill to last until upcoming appt   acetaminophen (TYLENOL) 325 MG tablet Take 1-2 tablets (325-650 mg total) by mouth every 6 (six) hours as needed for mild pain (or temp >/= 101 F). (Patient not taking: Reported on 03/30/2021)   clopidogrel (PLAVIX) 75 MG tablet Take 1 tablet (75 mg total) by mouth daily. (Patient not taking: Reported on 01/28/2021)   lubiprostone (AMITIZA) 24 MCG capsule Take 1 capsule (24 mcg total) by mouth 2 (two) times daily with a meal. (Patient not taking: Reported on 01/28/2020)   polyethylene glycol (MIRALAX / GLYCOLAX) 17 g packet Take 17 g by mouth daily as needed. (Patient not taking: Reported on 01/28/2021)   Sennosides (EX-LAX PO) Take 3-4 tablets by mouth every 3 (three) days as needed (constipation).  (Patient not taking: Reported on 01/28/2021)   No facility-administered medications prior to visit.    Review of Systems  Constitutional:  Negative for appetite change, chills and fever.  Respiratory:  Negative for chest tightness, shortness of breath and wheezing.   Cardiovascular:  Negative for chest pain and palpitations.  Gastrointestinal:  Negative for abdominal pain, nausea and vomiting.      Objective    BP (!) 197/99 (BP Location: Left Arm, Patient Position: Sitting, Cuff Size: Normal)    Pulse 85    Temp 98 F (36.7 C) (Oral)    Resp 14    Wt 165 lb (74.8 kg)    SpO2 100% Comment: room air   BMI 21.77 kg/m   Today's Vitals   03/30/21 1002 03/30/21 1007  BP: (!) 188/95 (!) 197/99  Pulse: 85   Resp: 14   Temp: 98 F (36.7 C)   TempSrc: Oral   SpO2: 100%   Weight: 165 lb (74.8 kg)    Body mass index is 21.77 kg/m.   Physical Exam   General: Appearance:    Well developed, well nourished male in no acute distress  Eyes:    PERRL, conjunctiva/corneas clear, EOM's intact       Lungs:     Clear to auscultation bilaterally, respirations unlabored  Heart:    Normal heart rate. Normal rhythm. No murmurs, rubs,  or gallops.  Left carotid bruit on ausculation.   MS:   All extremities are intact.    Neurologic:   Awake, alert, oriented x 3. No apparent focal neurological defect.          Assessment & Plan     1. Chronic, continuous use of opioids  - Pain Mgt Scrn (14 Drugs), Ur  2. History of lumbar fusion (L4-S1 posterior hardware)   3. Chronic pain syndrome  Fairly well controlled on current pain medication regiment.   4. Primary hypertension add amLODipine (NORVASC) 2.5 MG tablet; Take 1 tablet (2.5 mg total) by mouth daily.  Dispense: 30 tablet; Refill: 1  5. Hypercholesteremia He is tolerating atorvastatin well with no adverse effects.   - CBC - Comprehensive metabolic panel - Lipid panel  6. Carotid artery disease, unspecified laterality, unspecified type ([place])   7. S/P abdominal aortic aneurysm repair Given contact number to schedule routine follow up with vascular.   BP check in about a month.  Future Appointments  Date Time Provider [place]  04/27/2021  9:40 AM [name], MD [facility]  02/02/2022  3:00 PM BFP-NURSE HEALTH ADVISOR [facility]     He states he had his flu shot at pharmacy this flu season.         [name], MD  [facility] [phone] (phone) [phone] (fax)  [place]

## 2021-03-31 LAB — COMPREHENSIVE METABOLIC PANEL
ALT: 8 IU/L (ref 0–44)
AST: 15 IU/L (ref 0–40)
Albumin/Globulin Ratio: 1.6 (ref 1.2–2.2)
Albumin: 4.4 g/dL (ref 3.7–4.7)
Alkaline Phosphatase: 72 IU/L (ref 44–121)
BUN/Creatinine Ratio: 13 (ref 10–24)
BUN: 19 mg/dL (ref 8–27)
Bilirubin Total: 0.4 mg/dL (ref 0.0–1.2)
CO2: 25 mmol/L (ref 20–29)
Calcium: 9.4 mg/dL (ref 8.6–10.2)
Chloride: 94 mmol/L — ABNORMAL LOW (ref 96–106)
Creatinine, Ser: 1.48 mg/dL — ABNORMAL HIGH (ref 0.76–1.27)
Globulin, Total: 2.7 g/dL (ref 1.5–4.5)
Glucose: 89 mg/dL (ref 70–99)
Potassium: 4 mmol/L (ref 3.5–5.2)
Sodium: 139 mmol/L (ref 134–144)
Total Protein: 7.1 g/dL (ref 6.0–8.5)
eGFR: 49 mL/min/{1.73_m2} — ABNORMAL LOW (ref >59)

## 2021-03-31 LAB — CBC
Hematocrit: 45.4 % (ref 37.5–51.0)
Hemoglobin: 15.6 g/dL (ref 13.0–17.7)
MCH: 31.2 pg (ref 26.6–33.0)
MCHC: 34.4 g/dL (ref 31.5–35.7)
MCV: 91 fL (ref 79–97)
Platelets: 195 10*3/uL (ref 150–450)
RBC: 5 x10E6/uL (ref 4.14–5.80)
RDW: 13.1 % (ref 11.6–15.4)
WBC: 10.3 10*3/uL (ref 3.4–10.8)

## 2021-03-31 LAB — LIPID PANEL
Chol/HDL Ratio: 3.7 ratio (ref 0.0–5.0)
Cholesterol, Total: 204 mg/dL — ABNORMAL HIGH (ref 100–199)
HDL: 55 mg/dL (ref >39)
LDL Chol Calc (NIH): 135 mg/dL — ABNORMAL HIGH (ref 0–99)
Triglycerides: 80 mg/dL (ref 0–149)
VLDL Cholesterol Cal: 14 mg/dL (ref 5–40)

## 2021-04-01 LAB — PAIN MGT SCRN (14 DRUGS), UR
Amphetamine Scrn, Ur: NEGATIVE ng/mL
BARBITURATE SCREEN URINE: NEGATIVE ng/mL
BENZODIAZEPINE SCREEN, URINE: NEGATIVE ng/mL
Buprenorphine, Urine: NEGATIVE ng/mL
CANNABINOIDS UR QL SCN: POSITIVE ng/mL — AB
Cocaine (Metab) Scrn, Ur: NEGATIVE ng/mL
Creatinine(Crt), U: 118.8 mg/dL (ref 20.0–300.0)
Fentanyl, Urine: NEGATIVE pg/mL
Meperidine Screen, Urine: NEGATIVE ng/mL
Methadone Screen, Urine: NEGATIVE ng/mL
OXYCODONE+OXYMORPHONE UR QL SCN: POSITIVE ng/mL — AB
Opiate Scrn, Ur: POSITIVE ng/mL — AB
Ph of Urine: 6.8 (ref 4.5–8.9)
Phencyclidine Qn, Ur: NEGATIVE ng/mL
Propoxyphene Scrn, Ur: NEGATIVE ng/mL
Tramadol Screen, Urine: NEGATIVE ng/mL

## 2021-04-02 ENCOUNTER — Other Ambulatory Visit: Payer: Self-pay | Admitting: Family Medicine

## 2021-04-02 DIAGNOSIS — M5442 Lumbago with sciatica, left side: Secondary | ICD-10-CM

## 2021-04-02 DIAGNOSIS — G8929 Other chronic pain: Secondary | ICD-10-CM

## 2021-04-02 MED ORDER — OXYCODONE HCL 15 MG PO TABS: 15.0000 mg | ORAL_TABLET | Freq: Four times a day (QID) | ORAL | 0 refills | Status: DC | PRN

## 2021-04-02 NOTE — Telephone Encounter (Signed)
Requested medication (s) are due for refill today: yes ? ?Requested medication (s) are on the active medication list: yes ? ?Last refill:  03/11/21 #120 with 0 RF ? ?Future visit scheduled: 04/27/21 ? ?Notes to clinic:  This medication can not be delegated, please assess.  ? ? ? ?  ? ?Requested Prescriptions  ?Pending Prescriptions Disp Refills  ? oxyCODONE (ROXICODONE) 15 MG immediate release tablet 120 tablet 0  ?  Sig: Take 1 tablet (15 mg total) by mouth 4 (four) times daily as needed for pain.  ?  ? Not Delegated - Analgesics:  Opioid Agonists Failed - 04/02/2021  5:40 PM  ?  ?  Failed - This refill cannot be delegated  ?  ?  Failed - Urine Drug Screen completed in last 360 days  ?  ?  Passed - Valid encounter within last 3 months  ?  Recent Outpatient Visits   ? ?      ? 3 days ago Chronic, continuous use of opioids  ? [facility] Caryn Section, [name], MD  ? 1 year ago Syncope and collapse  ? [facility] Caryn Section, [name], MD  ? 1 year ago Primary hypertension  ? [facility] Caryn Section, [name], MD  ? 2 years ago Annual physical exam  ? [facility] [name], MD  ? 2 years ago Onychomycosis  ? [facility] Caryn Section, [name], MD  ? ?  ?  ?Future Appointments   ? ?        ? In 3 weeks Fisher, [name], MD [facility]  ? ?  ? ?  ?  ?  ? ? ?

## 2021-04-02 NOTE — Telephone Encounter (Signed)
Medication: oxyCODONE (ROXICODONE) 15 MG immediate release tablet [[id number]]  ? ?Has the patient contacted their pharmacy? Yes advised to contact the office ?(Agent: If no, request that the patient contact the pharmacy for the refill. If patient does not wish to contact the pharmacy document the reason why and proceed with request.) ?(Agent: If yes, when and what did the pharmacy advise?) ? ?Preferred Pharmacy (with phone number or street name): [facility] #[id number] - [place], [place] [address] AT [place] ?[place] [address] ?Phone: [phone] Fax: [phone] ?Hours: Not open 24 hours ? ? ?Has the patient been seen for an appointment in the last year OR does the patient have an upcoming appointment? YES 03/30/21 ? ?Agent: Please be advised that RX refills may take up to 3 business days. We ask that you follow-up with your pharmacy. ?

## 2021-04-27 ENCOUNTER — Other Ambulatory Visit: Payer: Self-pay | Admitting: Family Medicine

## 2021-04-27 ENCOUNTER — Ambulatory Visit: Payer: Medicare HMO | Admitting: Family Medicine

## 2021-04-27 DIAGNOSIS — G8929 Other chronic pain: Secondary | ICD-10-CM

## 2021-04-27 NOTE — Telephone Encounter (Signed)
Medication: oxyCODONE (ROXICODONE) 15 MG immediate release tablet [[id number]] Pt has 12 pills left. Pt states that his wife is in hospice care. And he has been awake more that normal.  ? ?Has the patient contacted their pharmacy? NO ?(Agent: If no, request that the patient contact the pharmacy for the refill. If patient does not wish to contact the pharmacy document the reason why and proceed with request.) ?(Agent: If yes, when and what did the pharmacy advise?) ? ?Preferred Pharmacy (with phone number or street name): [facility] #[id number] - [place], [place] [address] AT [place] ?[place] [address] ?Phone: [phone] Fax: [phone] ?Hours: Not open 24 hours ? ? ?Has the patient been seen for an appointment in the last year OR does the patient have an upcoming appointment? YES 02/2/723 ? ?Agent: Please be advised that RX refills may take up to 3 business days. We ask that you follow-up with your pharmacy. ?

## 2021-04-28 MED ORDER — OXYCODONE HCL 15 MG PO TABS: 15.0000 mg | ORAL_TABLET | Freq: Four times a day (QID) | ORAL | 0 refills | Status: DC | PRN

## 2021-04-28 NOTE — Telephone Encounter (Signed)
Requested medication (s) are due for refill today: Due 05/03/21 ? ?Requested medication (s) are on the active medication list: yes   ? ?Last refill: 04/02/21  #120  0 refills ? ?Future visit scheduled yes with nurse 02/04/22 ? ?Notes to clinic:not delegated, please review. Thank you. ? ?Requested Prescriptions  ?Pending Prescriptions Disp Refills  ? oxyCODONE (ROXICODONE) 15 MG immediate release tablet 120 tablet 0  ?  Sig: Take 1 tablet (15 mg total) by mouth 4 (four) times daily as needed for pain.  ?  ? Not Delegated - Analgesics:  Opioid Agonists Failed - 04/27/2021 10:41 PM  ?  ?  Failed - This refill cannot be delegated  ?  ?  Failed - Urine Drug Screen completed in last 360 days  ?  ?  Passed - Valid encounter within last 3 months  ?  Recent Outpatient Visits   ? ?      ? 4 weeks ago Chronic, continuous use of opioids  ? [facility] Caryn Section, [name], MD  ? 1 year ago Syncope and collapse  ? [facility] Caryn Section, [name], MD  ? 1 year ago Primary hypertension  ? [facility] Caryn Section, [name], MD  ? 2 years ago Annual physical exam  ? [facility] [name], MD  ? 2 years ago Onychomycosis  ? [facility] Caryn Section, [name], MD  ? ?  ?  ? ?  ?  ?  ? ? ? ? ?

## 2021-05-26 ENCOUNTER — Other Ambulatory Visit: Payer: Self-pay | Admitting: Family Medicine

## 2021-05-26 DIAGNOSIS — G8929 Other chronic pain: Secondary | ICD-10-CM

## 2021-05-26 NOTE — Telephone Encounter (Signed)
Pt called in to request a refill for oxyCODONE (ROXICODONE) 15 MG immediate release tablet  ? ? ?Pharmacy:  ?[place], [place] [address] AT [place] Phone:  [phone]  ?Fax:  [phone]  ?  ? ? ?Future visit: none scheduled with pcp ?

## 2021-05-28 MED ORDER — OXYCODONE HCL 15 MG PO TABS: 15.0000 mg | ORAL_TABLET | Freq: Four times a day (QID) | ORAL | 0 refills | Status: DC | PRN

## 2021-05-28 NOTE — Telephone Encounter (Signed)
Requested medications are due for refill today.  yes ? ?Requested medications are on the active medications list.  yes ? ?Last refill. 04/28/2021 #120 0 refills ? ?Future visit scheduled.   no ? ?Notes to clinic.  Medication refill is not delegated. ? ? ? ?Requested Prescriptions  ?Pending Prescriptions Disp Refills  ? oxyCODONE (ROXICODONE) 15 MG immediate release tablet 120 tablet 0  ?  Sig: Take 1 tablet (15 mg total) by mouth 4 (four) times daily as needed for pain.  ?  ? Not Delegated - Analgesics:  Opioid Agonists Failed - 05/27/2021 10:46 AM  ?  ?  Failed - This refill cannot be delegated  ?  ?  Failed - Urine Drug Screen completed in last 360 days  ?  ?  Passed - Valid encounter within last 3 months  ?  Recent Outpatient Visits   ? ?      ? 1 month ago Chronic, continuous use of opioids  ? [facility] Caryn Section, [name], MD  ? 1 year ago Syncope and collapse  ? [facility] Caryn Section, [name], MD  ? 1 year ago Primary hypertension  ? [facility] Caryn Section, [name], MD  ? 2 years ago Annual physical exam  ? [facility] [name], MD  ? 3 years ago Onychomycosis  ? [facility] Caryn Section, [name], MD  ? ?  ?  ? ? ?  ?  ?  ?  ?

## 2021-05-29 ENCOUNTER — Other Ambulatory Visit: Payer: Self-pay | Admitting: Family Medicine

## 2021-06-19 ENCOUNTER — Other Ambulatory Visit: Payer: Self-pay | Admitting: Family Medicine

## 2021-06-19 DIAGNOSIS — G8929 Other chronic pain: Secondary | ICD-10-CM

## 2021-06-19 MED ORDER — OXYCODONE HCL 15 MG PO TABS: 15.0000 mg | ORAL_TABLET | Freq: Four times a day (QID) | ORAL | 0 refills | Status: DC | PRN

## 2021-06-19 NOTE — Telephone Encounter (Signed)
Requested medications are due for refill today.  yes  Requested medications are on the active medications list.  yes  Last refill. 05/28/2021 #120 0 refills  Future visit scheduled.   yes  Notes to clinic.  Medication refill is not delegated.    Requested Prescriptions  Pending Prescriptions Disp Refills   oxyCODONE (ROXICODONE) 15 MG immediate release tablet 120 tablet 0    Sig: Take 1 tablet (15 mg total) by mouth 4 (four) times daily as needed for pain.     Not Delegated - Analgesics:  Opioid Agonists Failed - 06/19/2021  1:30 PM      Failed - This refill cannot be delegated      Failed - Urine Drug Screen completed in last 360 days      Passed - Valid encounter within last 3 months    Recent Outpatient Visits           2 months ago Chronic, continuous use of opioids   [facility] [name], MD   1 year ago Syncope and collapse   [facility] [name], MD   1 year ago Primary hypertension   [facility] [name], MD   2 years ago Annual physical exam   [facility] [name], MD   3 years ago Onychomycosis   [facility] [name], MD

## 2021-06-19 NOTE — Telephone Encounter (Signed)
Medication Refill - Medication:  oxyCODONE (ROXICODONE) 15 MG immediate release tablet   Has the patient contacted their pharmacy? Yes.   Contact PCP  Preferred Pharmacy (with phone number or street name):  [facility] #[id number] [facility], [place] [address] AT [place]  [place], [place] [address]  Phone:  [phone]  Fax:  [phone]   Has the patient been seen for an appointment in the last year OR does the patient have an upcoming appointment? Yes.    Agent: Please be advised that RX refills may take up to 3 business days. We ask that you follow-up with your pharmacy.

## 2021-07-03 DIAGNOSIS — H34832 Tributary (branch) retinal vein occlusion, left eye, with macular edema: Secondary | ICD-10-CM | POA: Diagnosis not present

## 2021-07-08 DIAGNOSIS — H34832 Tributary (branch) retinal vein occlusion, left eye, with macular edema: Secondary | ICD-10-CM | POA: Diagnosis not present

## 2021-07-10 ENCOUNTER — Other Ambulatory Visit: Payer: Self-pay | Admitting: Family Medicine

## 2021-07-10 DIAGNOSIS — G8929 Other chronic pain: Secondary | ICD-10-CM

## 2021-07-10 NOTE — Telephone Encounter (Signed)
Medication Refill - Medication:  oxyCODONE (ROXICODONE) 15 MG immediate release tablet   Has the patient contacted their pharmacy? Yes.   Contact PCP  Preferred Pharmacy (with phone number or street name):  [facility] #[id number] [facility], [place] [address] AT [place]  [place], [place] [address]  Phone:  [phone]  Fax:  [phone]   Has the patient been seen for an appointment in the last year OR does the patient have an upcoming appointment? Yes.    Agent: Please be advised that RX refills may take up to 3 business days. We ask that you follow-up with your pharmacy.

## 2021-07-10 NOTE — Telephone Encounter (Signed)
Requested medications are due for refill today.  yes  Requested medications are on the active medications list.  yes  Last refill. 06/19/2021 #120 0 refills  Future visit scheduled.   no  Notes to clinic.  Medication refill is not delegated.    Requested Prescriptions  Pending Prescriptions Disp Refills   oxyCODONE (ROXICODONE) 15 MG immediate release tablet 120 tablet 0    Sig: Take 1 tablet (15 mg total) by mouth 4 (four) times daily as needed for pain.     Not Delegated - Analgesics:  Opioid Agonists Failed - 07/10/2021 10:21 AM      Failed - This refill cannot be delegated      Failed - Urine Drug Screen completed in last 360 days      Failed - Valid encounter within last 3 months    Recent Outpatient Visits           3 months ago Chronic, continuous use of opioids   [facility] [name], MD   1 year ago Syncope and collapse   [facility] [name], MD   1 year ago Primary hypertension   [facility] [name], MD   2 years ago Annual physical exam   [facility] [name], MD   3 years ago Onychomycosis   [facility] [name], MD

## 2021-07-13 ENCOUNTER — Other Ambulatory Visit: Payer: Self-pay | Admitting: Family Medicine

## 2021-07-13 DIAGNOSIS — G8929 Other chronic pain: Secondary | ICD-10-CM

## 2021-07-13 MED ORDER — OXYCODONE HCL 15 MG PO TABS: 15.0000 mg | ORAL_TABLET | Freq: Four times a day (QID) | ORAL | 0 refills | Status: DC | PRN

## 2021-07-13 NOTE — Telephone Encounter (Signed)
Medication Refill - Medication: oxyCODONE (ROXICODONE) 15 MG immediate release tablet  Pt stated he is going out of town and needs this medication as soon as possible.   Has the patient contacted their pharmacy? No. No more refills.   (Agent: If no, request that the patient contact the pharmacy for the refill. If patient does not wish to contact the pharmacy document the reason why and proceed with request.)   Preferred Pharmacy (with phone number or street name):  [facility] #[id number] [facility], [place] [address] AT [place]  [place] [address]  Phone: [phone] Fax: [phone]  Hours: Not open 24 hours   Has the patient been seen for an appointment in the last year OR does the patient have an upcoming appointment? Yes.    Agent: Please be advised that RX refills may take up to 3 business days. We ask that you follow-up with your pharmacy.

## 2021-07-15 NOTE — Telephone Encounter (Signed)
Pt called to follow up on this Rx, pt is requesting an early refill, stated the pharmacy would not fill it for him today.

## 2021-08-10 ENCOUNTER — Telehealth: Payer: Self-pay | Admitting: Family Medicine

## 2021-08-10 DIAGNOSIS — G8929 Other chronic pain: Secondary | ICD-10-CM

## 2021-08-10 NOTE — Telephone Encounter (Unsigned)
Copied from [place] [phone]. Topic: General - Other >> Aug 10, 2021 10:25 AM Everette C wrote: Reason for CRM: Medication Refill - Medication: oxyCODONE (ROXICODONE) 15 MG immediate release tablet [[id number]]   Has the patient contacted their pharmacy? No. (Agent: If no, request that the patient contact the pharmacy for the refill. If patient does not wish to contact the pharmacy document the reason why and proceed with request.) (Agent: If yes, when and what did the pharmacy advise?)  Preferred Pharmacy (with phone number or street name): [place], [place] [address] AT [place] [address] Phone: [phone] Fax: [phone] Hours: Not open 24 hours  Has the patient been seen for an appointment in the last year OR does the patient have an upcoming appointment? Yes.    Agent: Please be advised that RX refills may take up to 3 business days. We ask that you follow-up with your pharmacy.

## 2021-08-11 MED ORDER — OXYCODONE HCL 15 MG PO TABS: 15.0000 mg | ORAL_TABLET | Freq: Four times a day (QID) | ORAL | 0 refills | Status: DC | PRN

## 2021-08-11 NOTE — Telephone Encounter (Signed)
Requested medication (s) are due for refill today: yes  Requested medication (s) are on the active medication list: yes  Last refill:  07/13/21 #120 0 refills  Future visit scheduled: no  Notes to clinic:  not delegated per protocol. Do you want to refill Rx?     Requested Prescriptions  Pending Prescriptions Disp Refills   oxyCODONE (ROXICODONE) 15 MG immediate release tablet 120 tablet 0    Sig: Take 1 tablet (15 mg total) by mouth 4 (four) times daily as needed for pain.     Not Delegated - Analgesics:  Opioid Agonists Failed - 08/11/2021 11:19 AM      Failed - This refill cannot be delegated      Failed - Urine Drug Screen completed in last 360 days      Failed - Valid encounter within last 3 months    Recent Outpatient Visits           4 months ago Chronic, continuous use of opioids   [facility] [name], MD   1 year ago Syncope and collapse   [facility] [name], MD   1 year ago Primary hypertension   [facility] [name], MD   2 years ago Annual physical exam   [facility] [name], MD   3 years ago Onychomycosis   [facility] [name], MD

## 2021-08-11 NOTE — Telephone Encounter (Signed)
Pt called reporting that he just spoke to the pharmacy and was told that they did not have his Rx for him. Please advise

## 2021-08-12 DIAGNOSIS — H34832 Tributary (branch) retinal vein occlusion, left eye, with macular edema: Secondary | ICD-10-CM | POA: Diagnosis not present

## 2021-08-12 NOTE — Telephone Encounter (Signed)
I called pharmacy and was advised that patient picked up prescription. FYI: Pharmacy states they were low on supply, so patient only received # 98 tablets (24 day supply).

## 2021-08-25 ENCOUNTER — Telehealth: Payer: Self-pay | Admitting: Family Medicine

## 2021-08-25 NOTE — Telephone Encounter (Signed)
Patient was prescribed #120 Oxycodone 15 mg. On 08/10/21 but the [facility] in [place] only had 98 tablets to give him.    (I seen the bottle with this on it)   he is asking if someone call send in the remaining amount of the RX to them.    He wants to go see his brother in [place] and needs to take these with him.  Please let patient know on home phone #

## 2021-08-27 ENCOUNTER — Other Ambulatory Visit: Payer: Self-pay | Admitting: Family Medicine

## 2021-08-27 DIAGNOSIS — G8929 Other chronic pain: Secondary | ICD-10-CM

## 2021-08-27 NOTE — Telephone Encounter (Signed)
Again, this patient has come up to the window asking for the remaining pills of his RX to be sent to [facility] in [place] for the 22 pills he did not get on 08/11/21 due to a short from the manufacturer.   He showed me the bottle from [facility] that he only got 98 pills on 08/11/21. As of today.  He has 9 pills left and is going to [facility] to see his brother for 2 weeks and needs to take this med with him.  Per Dr. Ky Barban, the 98 pills he got should last him till august 4 but there are only 9 pills in his bottle.     I told him that Dr. Ky Barban said no to writing for the remaining 22 pills and then he ask for something for withdrawals to be sent in.

## 2021-08-31 NOTE — Telephone Encounter (Signed)
Last refill was sent in on 08/11/2021 for qty of 120 tablets.  Pharmacy was short on supply and patient was only dispensed qty of 98 tablets (24 day supply). Patient is out of town and requesting a refill to be sent to pharmacy stated below. Please advise on refill request.

## 2021-08-31 NOTE — Telephone Encounter (Signed)
Patient is calling in from [place], [place] asking for a partial refill on his prescription that he says he has been taking for over 20 years. He is asking for maybe 10 pills be sent to [facility] in [place], [place], [place] - [address] Phone:  [phone]  Fax:  [phone]

## 2021-08-31 NOTE — Telephone Encounter (Signed)
I am not sure about rules for narcotics and prescribing in other states. Patient requesting RF be sent to [place]. Call sent for office review - non delegated Rx request

## 2021-08-31 NOTE — Addendum Note (Signed)
Addended by: Randal Buba on: 08/31/2021 03:15 PM   Modules accepted: Orders

## 2021-09-01 NOTE — Telephone Encounter (Addendum)
Pt called to get Dr. Caryn Section to call pharmacy and authorize refill for oxyCODONE (ROXICODONE) 15 MG immediate release tablet / pt stated he has 1 pill left / please advise asap

## 2021-09-01 NOTE — Telephone Encounter (Signed)
Patient called in again asking for short supply of Oxycodone until refill comes in. He was informed has to wait until t08/04 but he is still requesting this for his back pain

## 2021-09-01 NOTE — Telephone Encounter (Signed)
Pt. Calling again about refill. "I just need a few until 09/04/21." Instructed request has been sent to PCP.

## 2021-09-01 NOTE — Telephone Encounter (Addendum)
Patient has 2 pills left and states he does not take more then 4 pills as prescribed. Not sure when he picked up 98 pills but states he does not recall  120 being prescribed. Patient would like a follow up call.     [facility] #[id number] - [place], [place] [address] AT [address] Phone:  [phone]  Fax:  [phone]

## 2021-09-02 MED ORDER — OXYCODONE HCL 15 MG PO TABS: 15.0000 mg | ORAL_TABLET | Freq: Four times a day (QID) | ORAL | 0 refills | Status: DC | PRN

## 2021-09-02 NOTE — Telephone Encounter (Signed)
Medication Refill - Medication: Oxycodone oxyCODONE (ROXICODONE) 15 MG immediate release tablet Patient called in again asking for temporary to be sent ot pharmacy  Has the patient contacted their pharmacy? yes (Agent: If no, request that the patient contact the pharmacy for the refill. If patient does not wish to contact the pharmacy document the reason why and proceed with request.) (Agent: If yes, when and what did the pharmacy advise?)contact pcp  Preferred Pharmacy (with phone number or street name):  [facility] #[id number] - [place], [place] [address] AT [address] Phone:  [phone]  Fax:  [phone]     Has the patient been seen for an appointment in the last year OR does the patient have an upcoming appointment? yes  Agent: Please be advised that RX refills may take up to 3 business days. We ask that you follow-up with your pharmacy.

## 2021-09-02 NOTE — Addendum Note (Signed)
Addended by: Durwin Nora on: 09/02/2021 02:18 PM   Modules accepted: Orders

## 2021-09-02 NOTE — Telephone Encounter (Signed)
Requested medication (s) are due for refill today: no  Requested medication (s) are on the active medication list: yes  Last refill:  09/02/21  Future visit scheduled: yes  Notes to clinic:  NT not delegated to refuse med   Requested Prescriptions  Pending Prescriptions Disp Refills   oxyCODONE (ROXICODONE) 15 MG immediate release tablet 120 tablet 0    Sig: Take 1 tablet (15 mg total) by mouth 4 (four) times daily as needed for pain.     Not Delegated - Analgesics:  Opioid Agonists Failed - 09/02/2021  2:18 PM      Failed - This refill cannot be delegated      Failed - Urine Drug Screen completed in last 360 days      Failed - Valid encounter within last 3 months    Recent Outpatient Visits           5 months ago Chronic, continuous use of opioids   [facility] [name], MD   1 year ago Syncope and collapse   [facility] [name], MD   1 year ago Primary hypertension   [facility] [name], MD   2 years ago Annual physical exam   [facility] [name], MD   3 years ago Onychomycosis   [facility] [name], MD       Future Appointments             In 2 days Fisher, [name], MD [facility]            Signed Prescriptions Disp Refills   oxyCODONE (ROXICODONE) 15 MG immediate release tablet 120 tablet 0    Sig: Take 1 tablet (15 mg total) by mouth 4 (four) times daily as needed for pain.     Not Delegated - Analgesics:  Opioid Agonists Failed - 09/01/2021 11:21 AM      Failed - This refill cannot be delegated      Failed - Urine Drug Screen completed in last 360 days      Failed - Valid encounter within last 3 months    Recent Outpatient Visits           5 months ago Chronic, continuous use of opioids   [facility] [name], MD   1 year ago Syncope and collapse   [facility] [name], MD   1 year ago Primary hypertension   [facility] [name], MD   2 years ago Annual physical exam   [facility] [name], MD   3 years ago Onychomycosis   [facility] [name], MD       Future Appointments             In 2 days Fisher, [name], MD [facility] [place]

## 2021-09-03 ENCOUNTER — Other Ambulatory Visit: Payer: Self-pay | Admitting: Family Medicine

## 2021-09-03 DIAGNOSIS — M5441 Lumbago with sciatica, right side: Secondary | ICD-10-CM

## 2021-09-03 MED ORDER — OXYCODONE HCL 15 MG PO TABS: 15.0000 mg | ORAL_TABLET | Freq: Four times a day (QID) | ORAL | 0 refills | Status: DC | PRN

## 2021-09-03 NOTE — Telephone Encounter (Signed)
Pt called back saying he is home now and is completely out of his pain medication.  He is asking for a refill asap so he will not have withdrawal symptoms.

## 2021-09-03 NOTE — Telephone Encounter (Signed)
Multiple calls requesting controlled medication- request sent to office

## 2021-09-03 NOTE — Telephone Encounter (Signed)
Pt called to see if Oxycodone was going to be sent to the correct [place] [facility] today / advised pt Dr. Caryn Section was not in the office and will be in tomorrow / please advise pt when this is resent

## 2021-09-03 NOTE — Telephone Encounter (Signed)
Pt stated he went to a [facility] in [place] while out of town but now he is back home and requests that the Rx for oxyCODONE (ROXICODONE) 15 MG immediate release tablet be sent to  [place] #[id number] [facility], [place] - [address] AT [address] Phone:  [phone]  Fax:  [phone]

## 2021-09-04 ENCOUNTER — Ambulatory Visit (INDEPENDENT_AMBULATORY_CARE_PROVIDER_SITE_OTHER): Payer: Medicare HMO | Admitting: Family Medicine

## 2021-09-04 ENCOUNTER — Other Ambulatory Visit: Payer: Self-pay | Admitting: Family Medicine

## 2021-09-04 ENCOUNTER — Encounter: Payer: Self-pay | Admitting: Family Medicine

## 2021-09-04 VITALS — BP 168/105 | HR 92 | Ht 73.0 in | Wt 162.4 lb

## 2021-09-04 DIAGNOSIS — Z8679 Personal history of other diseases of the circulatory system: Secondary | ICD-10-CM

## 2021-09-04 DIAGNOSIS — Z9889 Other specified postprocedural states: Secondary | ICD-10-CM

## 2021-09-04 DIAGNOSIS — G894 Chronic pain syndrome: Secondary | ICD-10-CM | POA: Diagnosis not present

## 2021-09-04 DIAGNOSIS — E7800 Pure hypercholesterolemia, unspecified: Secondary | ICD-10-CM | POA: Diagnosis not present

## 2021-09-04 DIAGNOSIS — F17210 Nicotine dependence, cigarettes, uncomplicated: Secondary | ICD-10-CM

## 2021-09-04 DIAGNOSIS — I6523 Occlusion and stenosis of bilateral carotid arteries: Secondary | ICD-10-CM | POA: Diagnosis not present

## 2021-09-04 DIAGNOSIS — Z125 Encounter for screening for malignant neoplasm of prostate: Secondary | ICD-10-CM

## 2021-09-04 DIAGNOSIS — I2510 Atherosclerotic heart disease of native coronary artery without angina pectoris: Secondary | ICD-10-CM

## 2021-09-04 DIAGNOSIS — Z0000 Encounter for general adult medical examination without abnormal findings: Secondary | ICD-10-CM

## 2021-09-04 DIAGNOSIS — R1310 Dysphagia, unspecified: Secondary | ICD-10-CM

## 2021-09-04 DIAGNOSIS — I700 Atherosclerosis of aorta: Secondary | ICD-10-CM

## 2021-09-04 DIAGNOSIS — G8929 Other chronic pain: Secondary | ICD-10-CM

## 2021-09-04 DIAGNOSIS — F4321 Adjustment disorder with depressed mood: Secondary | ICD-10-CM

## 2021-09-04 DIAGNOSIS — M961 Postlaminectomy syndrome, not elsewhere classified: Secondary | ICD-10-CM | POA: Diagnosis not present

## 2021-09-04 DIAGNOSIS — I719 Aortic aneurysm of unspecified site, without rupture: Secondary | ICD-10-CM

## 2021-09-04 MED ORDER — FLUOXETINE HCL 20 MG PO CAPS: 20.0000 mg | ORAL_CAPSULE | Freq: Every day | ORAL | 1 refills | Status: DC

## 2021-09-04 MED ORDER — OXYCODONE HCL 15 MG PO TABS: 15.0000 mg | ORAL_TABLET | Freq: Four times a day (QID) | ORAL | 0 refills | Status: DC | PRN

## 2021-09-04 NOTE — Patient Instructions (Addendum)
Please review the attached list of medications and notify my office if there are any errors.   Be sure to take your blood pressure medications consistently every day  Please go to the lab draw station in [address] on the second floor of [facility]  when you are fasting for 8 hours. Normal hours are 8:00am to 11:30am and 1:00pm to 4:00pm Monday through Friday

## 2021-09-04 NOTE — Progress Notes (Signed)
Complete physical exam   Patient: Justin Harrington   DOB: [birth year]   76 y.o. Male  MRN: [id number] Visit Date: 09/04/2021  Today's healthcare provider: [name], MD   No chief complaint on file.  Subjective    Justin Harrington is a 76 y.o. male who presents today for a complete physical exam.  He reports consuming a general diet. He generally feels fairly well. He reports sleeping fairly poor. He only gets about four hours of sleep most nights.. He has complains about his back pain and is wanting a refill on oxycodone. He has had issues remembering to take his blood pressure medicine everyday.  HPI  Patient had AWV with [facility] on 01/28/2021.   He is also due for follow up for chronic pain medications, hypertension and hyperlipidemia. He has not been taking his medications consistently. Maybe remembers 2-3 days every week. Has been much more depressed since his wife passed away from stomach cancer.   He also reports he is having trouble swallowing feeling like food is frequently getting stuck in throat.   Past Medical History:  Diagnosis Date   Aortic aneurysm ([place])    Coronary artery disease    DVT (deep venous thrombosis) (H[facility])    H/O DVT in left leg, filter placed approximatly 8 yrs ago   H/O spinal fusion 1998   Back pain   Hyperlipidemia    Hypertension    controlled on meds   Personal history of tobacco use, presenting hazards to health 06/12/2015   Personal history of tobacco use, presenting hazards to health 06/12/2015   Prostate cancer [facility])    Had prostatectomy   Wears dentures    upper   Past Surgical History:  Procedure Laterality Date   BACK SURGERY  1998   Lumbar spine fusion   carotid doppler ultrasound  10/05/2011   75-90% RCA occlusion, 50% on left   CAROTID ENDARTERECTOMY Right 12/02/2011   Dr. Lucky Cowboy   CHOLECYSTECTOMY  10/02/2010   Laparoscopic, Dr. Pat Patrick, [facility]   COLONOSCOPY WITH PROPOFOL N/A 06/20/2017   Procedure: COLONOSCOPY WITH PROPOFOL;   Surgeon: [name], MD;  Location: [facility];  Service: Endoscopy;  Laterality: N/A;   CT SCAN  10/02/2010   [facility]; Infrarenal suprailiac abdominal aortic aneurysm maximum dimention of nearly 4cm. Cholelithiasis and acute cholecystiis. Mild enlargement of left adrenal gland   ENDOVASCULAR REPAIR/STENT GRAFT N/A 03/29/2018   Procedure: ENDOVASCULAR REPAIR/STENT GRAFT;  Surgeon: Algernon Huxley, MD;  Location: [facility];  Service: Cardiovascular;  Laterality: N/A;   LUMBAR SPINE HARDWARE REMOVAL  07/30/2016   [name], MD   PROSTATE SURGERY  10/19/2012   prostatectomy. [facility]; Laser assisted, Done by [name] for prostate cancer   Social History   Socioeconomic History   Marital status: Married    Spouse name: Not on file   Number of children: 2   Years of education: HS Diploma   Highest education level: 12th grade  Occupational History   Occupation: Retired  Tobacco Use   Smoking status: Every Day    Packs/day: 0.50    Years: 50.00    Total pack years: 25.00    Types: Cigarettes   Smokeless tobacco: Never  Vaping Use   Vaping Use: Never used  Substance and Sexual Activity   Alcohol use: No    Alcohol/week: 0.0 standard drinks of alcohol   Drug use: No   Sexual activity: Not on file  Other  Topics Concern   Not on file  Social History Narrative   Son passed at age 45 from cerebral palsy.   Social Determinants of Health   Financial Resource Strain: Low Risk  (01/28/2021)   Overall Financial Resource Strain (CARDIA)    Difficulty of Paying Living Expenses: Not hard at all  Food Insecurity: No Food Insecurity (01/28/2021)   Hunger Vital Sign    Worried About Running Out of Food in the Last Year: Never true    Ran Out of Food in the Last Year: Never true  Transportation Needs: No Transportation Needs (01/28/2021)   PRAPARE - Hydrologist (Medical): No    Lack of Transportation (Non-Medical): No   Physical Activity: Inactive (01/28/2021)   Exercise Vital Sign    Days of Exercise per Week: 0 days    Minutes of Exercise per Session: 0 min  Stress: No Stress Concern Present (01/28/2021)   [place]    Feeling of Stress : Not at all  Social Connections: Moderately Integrated (01/28/2021)   Social Connection and Isolation Panel [NHANES]    Frequency of Communication with Friends and Family: Three times a week    Frequency of Social Gatherings with Friends and Family: More than three times a week    Attends Religious Services: More than 4 times per year    Active Member of [employer] or Organizations: No    Attends Archivist Meetings: Never    Marital Status: Married  Human resources officer Violence: Not At Risk (01/28/2021)   Humiliation, Afraid, Rape, and Kick questionnaire    Fear of Current or Ex-Partner: No    Emotionally Abused: No    Physically Abused: No    Sexually Abused: No   Family Status  Relation Name Status   Mother  Deceased at age 35       MI   Father  Deceased at age 75       CHF, Black lung disease   Brother  Alive   Daughter  Alive   Son  Deceased       Cerbal Palsy. Died 2009-08-17   Family History  Problem Relation Age of Onset   Heart attack Mother    Congestive Heart Failure Father    Cerebral palsy Son    Allergies  Allergen Reactions   Zolpidem     Other reaction(s): Other (See Comments) GOT UP IN MIDDLE OF NIGHT AND FILLED ALL THE GLASSES WITH FLOUR    Patient Care Team: [name], MD as PCP - General (Family Medicine) Lucky Cowboy, Erskine Squibb, MD as Referring Physician (Vascular Surgery) Pa, [place] as Consulting Physician (Optometry) Ubaldo Glassing, Javier Docker, MD as Consulting Physician (Cardiology)   Medications: Outpatient Medications Prior to Visit  Medication Sig   hydrochlorothiazide (HYDRODIURIL) 25 MG tablet TAKE 1 TABLET(25 MG) BY MOUTH DAILY   oxyCODONE  (ROXICODONE) 15 MG immediate release tablet Take 1 tablet (15 mg total) by mouth 4 (four) times daily as needed for pain.   acetaminophen (TYLENOL) 325 MG tablet Take 1-2 tablets (325-650 mg total) by mouth every 6 (six) hours as needed for mild pain (or temp >/= 101 F). (Patient not taking: Reported on 03/30/2021)   amLODipine (NORVASC) 2.5 MG tablet Take 1 tablet (2.5 mg total) by mouth daily. (Patient not taking: Reported on 09/04/2021)   atorvastatin (LIPITOR) 80 MG tablet TAKE 1 TABLET(80 MG) BY MOUTH DAILY. (Patient not taking: Reported  on 09/04/2021)   clopidogrel (PLAVIX) 75 MG tablet Take 1 tablet (75 mg total) by mouth daily. (Patient not taking: Reported on 01/28/2021)   lubiprostone (AMITIZA) 24 MCG capsule Take 1 capsule (24 mcg total) by mouth 2 (two) times daily with a meal. (Patient not taking: Reported on 01/28/2020)   polyethylene glycol (MIRALAX / GLYCOLAX) 17 g packet Take 17 g by mouth daily as needed. (Patient not taking: Reported on 01/28/2021)   Sennosides (EX-LAX PO) Take 3-4 tablets by mouth every 3 (three) days as needed (constipation).  (Patient not taking: Reported on 01/28/2021)   traZODone (DESYREL) 150 MG tablet Take 1 tablet (150 mg total) by mouth at bedtime as needed for sleep. Pt given enough refill to last until upcoming appt (Patient not taking: Reported on 09/04/2021)   No facility-administered medications prior to visit.    Review of Systems  Constitutional:  Negative for chills, diaphoresis and fever.  HENT:  Negative for congestion, ear discharge, ear pain, hearing loss, nosebleeds, sore throat and tinnitus.   Eyes:  Negative for photophobia, pain, discharge and redness.  Respiratory:  Negative for cough, shortness of breath, wheezing and stridor.   Cardiovascular:  Negative for chest pain, palpitations and leg swelling.  Gastrointestinal:  Negative for abdominal pain, blood in stool, constipation, diarrhea, nausea and vomiting.  Endocrine: Negative for  polydipsia.  Genitourinary:  Negative for dysuria, flank pain, frequency, hematuria and urgency.  Musculoskeletal:  Negative for back pain, myalgias and neck pain.  Skin:  Negative for rash.  Allergic/Immunologic: Negative for environmental allergies.  Neurological:  Negative for dizziness, tremors, seizures, weakness and headaches.  Hematological:  Does not bruise/bleed easily.  Psychiatric/Behavioral:  Positive for dysphoric mood and sleep disturbance. Negative for agitation, confusion, hallucinations and suicidal ideas. The patient is nervous/anxious.       Objective    BP (!) 168/105   Pulse 92   Ht '6\' 1"'$  (1.854 m)   Wt 162 lb 6.4 oz (73.7 kg)   BMI 21.43 kg/m     Physical Exam   General Appearance:    Well developed, well nourished male. Alert, cooperative, in no acute distress, appears stated age  Head:    Normocephalic, without obvious abnormality, atraumatic  Eyes:    PERRL, conjunctiva/corneas clear, EOM's intact, fundi    benign, both eyes       Ears:    Normal TM's and external ear canals, both ears  Nose:   Nares normal, septum midline, mucosa normal, no drainage   or sinus tenderness  Throat:   Lips, mucosa, and tongue normal; teeth and gums normal  Neck:   Supple, symmetrical, trachea midline, no adenopathy;       thyroid:  No enlargement/tenderness/nodules; no carotid   bruit or JVD  Back:     Symmetric, no curvature, ROM normal, no CVA tenderness  Lungs:     Clear to auscultation bilaterally, respirations unlabored  Chest wall:    No tenderness or deformity  Heart:    Normal heart rate. Normal rhythm. No murmurs, rubs, or gallops.  S1 and S2 normal  Abdomen:     Soft, non-tender, bowel sounds active all four quadrants,    no masses, no organomegaly  Genitalia:    deferred  Rectal:    deferred  Extremities:   All extremities are intact. No cyanosis or edema  Pulses:   2+ and symmetric all extremities  Skin:   Skin color, texture, turgor normal, no rashes or  lesions  Lymph  nodes:   Cervical, supraclavicular, and axillary nodes normal  Neurologic:   CNII-XII intact. Normal strength, sensation and reflexes      throughout     Last depression screening scores    01/28/2021    3:42 PM 11/27/2019   10:46 AM 07/04/2018   10:40 AM  PHQ 2/9 Scores  PHQ - 2 Score 0 0 0  PHQ- 9 Score  2 2   Last fall risk screening    01/28/2021    3:44 PM  [place] in the past year? 0  Number falls in past yr: 0  Injury with Fall? 0  Risk for fall due to : No Fall Risks  Follow up Falls evaluation completed   Last Audit-C alcohol use screening    01/28/2021    3:42 PM  Alcohol Use Disorder Test (AUDIT)  Patient refused Alcohol Screening Tool Yes  1. How often do you have a drink containing alcohol? 0  2. How many drinks containing alcohol do you have on a typical day when you are drinking? 0  3. How often do you have six or more drinks on one occasion? 0  AUDIT-C Score 0   A score of 3 or more in women, and 4 or more in men indicates increased risk for alcohol abuse, EXCEPT if all of the points are from question 1   No results found for any visits on 09/04/21.  Assessment & Plan    Routine Health Maintenance and Physical Exam  Exercise Activities and Dietary recommendations  Goals      DIET - EAT MORE FRUITS AND VEGETABLES     DIET - INCREASE WATER INTAKE     Recommend to start drinking 3 glasses of water a day.       Quit Smoking     Recommend to continue efforts to reduce smoking habits until no longer smoking (Smoking Cessation literature attached to AVS).          Immunization History  Administered Date(s) Administered   Influenza, High Dose Seasonal PF 10/12/2016, 11/03/2017, 09/18/2018, 11/20/2019   Influenza-Unspecified 12/01/2014   PFIZER Comirnaty(Gray Top)Covid-19 Tri-Sucrose Vaccine 04/04/2019, 04/25/2019, 01/20/2020   Pneumococcal Conjugate-13 05/08/2013   Pneumococcal Polysaccharide-23 10/02/2010    Health  Maintenance  Topic Date Due   TETANUS/TDAP  Never done   Zoster Vaccines- Shingrix (1 of 2) Never done   Pneumonia Vaccine 12+ Years old (3 - PPSV23 or PCV20) 10/02/2015   COVID-19 Vaccine (4 - Pfizer series) 03/16/2020   INFLUENZA VACCINE  09/01/2021   COLONOSCOPY (Pts 45-76yr Insurance coverage will need to be confirmed)  06/21/2027   Hepatitis C Screening  Completed   HPV VACCINES  Aged Out   Fecal DNA (Cologuard)  Discontinued    Discussed health benefits of physical activity, and encouraged him to engage in regular exercise appropriate for his age and condition.   2. Grieving Recent passing of his wife. He is interested in medication to help with mood as he has been depressed with no motivation.  - FLUoxetine (PROZAC) 20 MG capsule; Take 1 capsule (20 mg total) by mouth daily.  Dispense: 30 capsule; Refill: 1  3. Hypercholesteremia He is tolerating atorvastatin well with no adverse effects.   - CBC - Comprehensive metabolic panel - Lipid panel - TSH  4. Smoking greater than 30 pack years Encouraged smoking cessation. Hold off on LDCT screening as he may be having thoracic chest imaging at his follow up with vascular surgery. Encouraged  smoking cessation  5. Prostate cancer screening  - PSA Total (Reflex To Free) ([facility] only)  6. Chronic pain syndrome   7. Failed back surgical syndrome Pain fairly well controlled for several years on current regiment.   8. Coronary artery disease involving native coronary artery of native heart without angina pectoris Asymptomatic. Compliant with medication.  Continue aggressive risk factor modification.   - EKG 12-Lead  9. 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 S/P abdominal aortic aneurysm repair Due for surveillance.  - Ambulatory referral to Vascular Surgery    12. Hypertension Poorly controlled due to not  taking medication consistently. He promises to start taking every day as prescribed  Future Appointments  Date Time Provider [place]  10/02/2021 11:20 AM [name], MD [facility]  02/02/2022  3:00 PM [facility]-NURSE HEALTH ADVISOR [facility]       The entirety of the information documented in the History of Present Illness, Review of Systems and Physical Exam were personally obtained by me. Portions of this information were initially documented by the CMA and reviewed by me for thoroughness and accuracy.     [name], MD  [facility] [phone] (phone) [phone] (fax)  [place]

## 2021-09-04 NOTE — Telephone Encounter (Signed)
Medication Refill - Medication: oxyCODONE (ROXICODONE) 15 MG immediate release tablet  Has the patient contacted their pharmacy? Yes.   The pharmacy is out of stock of medication pt requesting it be transferred to new pharmacy.   (Agent: If no, request that the patient contact the pharmacy for the refill. If patient does not wish to contact the pharmacy document the reason why and proceed with request.) (Agent: If yes, when and what did the pharmacy advise?)  Preferred Pharmacy (with phone number or street name):  [facility] [place], [place] - [address]  [place] [address]  Phone: [phone] Fax: [phone]  Hours: Not open 24 hours   Has the patient been seen for an appointment in the last year OR does the patient have an upcoming appointment? Yes.    Agent: Please be advised that RX refills may take up to 3 business days. We ask that you follow-up with your pharmacy.

## 2021-09-04 NOTE — Telephone Encounter (Signed)
Requested medication (s) are due for refill today:   Provider to review but looks like it was taken care of already  Requested medication (s) are on the active medication list:   Yes  Future visit scheduled:   Yes   Last ordered: 09/03/2021 #120, 0 refills  Non delegated refill duplicate request   Requested Prescriptions  Pending Prescriptions Disp Refills   oxyCODONE (ROXICODONE) 15 MG immediate release tablet 120 tablet 0    Sig: Take 1 tablet (15 mg total) by mouth 4 (four) times daily as needed for pain.     Not Delegated - Analgesics:  Opioid Agonists Failed - 09/04/2021  2:54 PM      Failed - This refill cannot be delegated      Failed - Urine Drug Screen completed in last 360 days      Passed - Valid encounter within last 3 months    Recent Outpatient Visits           Today Annual physical exam   [facility] [name], MD   5 months ago Chronic, continuous use of opioids   [facility] [name], MD   1 year ago Syncope and collapse   [facility] [name], MD   1 year ago Primary hypertension   [facility] [name], MD   2 years ago Annual physical exam   [facility] [name], MD       Future Appointments             In 4 weeks Fisher, [name], MD [facility], [place]

## 2021-09-08 ENCOUNTER — Other Ambulatory Visit: Payer: Self-pay | Admitting: Family Medicine

## 2021-09-08 ENCOUNTER — Encounter: Payer: Self-pay | Admitting: Family Medicine

## 2021-09-08 DIAGNOSIS — G8929 Other chronic pain: Secondary | ICD-10-CM

## 2021-09-08 DIAGNOSIS — Z125 Encounter for screening for malignant neoplasm of prostate: Secondary | ICD-10-CM | POA: Diagnosis not present

## 2021-09-08 DIAGNOSIS — E7800 Pure hypercholesterolemia, unspecified: Secondary | ICD-10-CM | POA: Diagnosis not present

## 2021-09-08 NOTE — Telephone Encounter (Signed)
Patient requesting medication be resent to [place]?

## 2021-09-08 NOTE — Telephone Encounter (Signed)
Patient stopped by the office asking for a refill on his Oxycodone 15 mg. To be went to [facility] in [place].

## 2021-09-09 LAB — COMPREHENSIVE METABOLIC PANEL
ALT: 8 IU/L (ref 0–44)
AST: 18 IU/L (ref 0–40)
Albumin/Globulin Ratio: 1.6 (ref 1.2–2.2)
Albumin: 4.1 g/dL (ref 3.8–4.8)
Alkaline Phosphatase: 62 IU/L (ref 44–121)
BUN/Creatinine Ratio: 12 (ref 10–24)
BUN: 18 mg/dL (ref 8–27)
Bilirubin Total: 0.6 mg/dL (ref 0.0–1.2)
CO2: 26 mmol/L (ref 20–29)
Calcium: 9.3 mg/dL (ref 8.6–10.2)
Chloride: 98 mmol/L (ref 96–106)
Creatinine, Ser: 1.5 mg/dL — ABNORMAL HIGH (ref 0.76–1.27)
Globulin, Total: 2.6 g/dL (ref 1.5–4.5)
Glucose: 117 mg/dL — ABNORMAL HIGH (ref 70–99)
Potassium: 3.8 mmol/L (ref 3.5–5.2)
Sodium: 137 mmol/L (ref 134–144)
Total Protein: 6.7 g/dL (ref 6.0–8.5)
eGFR: 48 mL/min/{1.73_m2} — ABNORMAL LOW (ref >59)

## 2021-09-09 LAB — PSA TOTAL (REFLEX TO FREE): Prostate Specific Ag, Serum: <0.1 ng/mL (ref 0.0–4.0)

## 2021-09-09 LAB — CBC
Hematocrit: 46.4 % (ref 37.5–51.0)
Hemoglobin: 15.8 g/dL (ref 13.0–17.7)
MCH: 31.5 pg (ref 26.6–33.0)
MCHC: 34.1 g/dL (ref 31.5–35.7)
MCV: 93 fL (ref 79–97)
Platelets: 192 10*3/uL (ref 150–450)
RBC: 5.01 x10E6/uL (ref 4.14–5.80)
RDW: 12.7 % (ref 11.6–15.4)
WBC: 10.2 10*3/uL (ref 3.4–10.8)

## 2021-09-09 LAB — TSH: TSH: 1.64 u[IU]/mL (ref 0.450–4.500)

## 2021-09-09 LAB — LIPID PANEL
Chol/HDL Ratio: 2.8 ratio (ref 0.0–5.0)
Cholesterol, Total: 148 mg/dL (ref 100–199)
HDL: 53 mg/dL (ref >39)
LDL Chol Calc (NIH): 80 mg/dL (ref 0–99)
Triglycerides: 77 mg/dL (ref 0–149)
VLDL Cholesterol Cal: 15 mg/dL (ref 5–40)

## 2021-09-10 MED ORDER — OXYCODONE HCL 15 MG PO TABS: 15.0000 mg | ORAL_TABLET | Freq: Four times a day (QID) | ORAL | 0 refills | Status: DC | PRN

## 2021-09-19 ENCOUNTER — Encounter: Payer: Self-pay | Admitting: Family Medicine

## 2021-09-23 DIAGNOSIS — H34832 Tributary (branch) retinal vein occlusion, left eye, with macular edema: Secondary | ICD-10-CM | POA: Diagnosis not present

## 2021-09-30 ENCOUNTER — Telehealth: Payer: Self-pay

## 2021-09-30 ENCOUNTER — Other Ambulatory Visit: Payer: Self-pay | Admitting: Family Medicine

## 2021-09-30 DIAGNOSIS — F4321 Adjustment disorder with depressed mood: Secondary | ICD-10-CM

## 2021-09-30 DIAGNOSIS — G8929 Other chronic pain: Secondary | ICD-10-CM

## 2021-09-30 NOTE — Telephone Encounter (Unsigned)
Copied from [place] [phone]. Topic: Referral - Request for Referral >> Sep 30, 2021  4:13 PM Everette C wrote: Has patient seen PCP for this complaint? Yes.   *If NO, is insurance requiring patient see PCP for this issue before PCP can refer them? Referral for which specialty: Behavioral Health  Preferred provider/office: Patient has no preference  Reason for referral: grief counseling - patient has recently lost their spouse

## 2021-09-30 NOTE — Telephone Encounter (Signed)
Copied from [place] [id number]. Topic: General - Other >> Sep 30, 2021  4:11 PM Everette C wrote: Reason for CRM: Medication Refill - Medication: oxyCODONE (ROXICODONE) 15 MG immediate release tablet [[id number]]   Has the patient contacted their pharmacy? Yes.  The patient has been directed to contact their PCP (Agent: If no, request that the patient contact the pharmacy for the refill. If patient does not wish to contact the pharmacy document the reason why and proceed with request.) (Agent: If yes, when and what did the pharmacy advise?)  Preferred Pharmacy (with phone number or street name): [facility], [place] [address] AT [place] [address] Phone: [phone] Fax: [phone] Hours: Not open 24 hours  Has the patient been seen for an appointment in the last year OR does the patient have an upcoming appointment? Yes.    Agent: Please be advised that RX refills may take up to 3 business days. We ask that you follow-up with your pharmacy.

## 2021-10-01 NOTE — Telephone Encounter (Signed)
Requested medication (s) are due for refill today -no  Requested medication (s) are on the active medication list -yes  Future visit scheduled -yes  Last refill: 09/10/21 #120   Notes to clinic: non delegated Rx  Requested Prescriptions  Pending Prescriptions Disp Refills   oxyCODONE (ROXICODONE) 15 MG immediate release tablet 120 tablet 0    Sig: Take 1 tablet (15 mg total) by mouth 4 (four) times daily as needed for pain.     Not Delegated - Analgesics:  Opioid Agonists Failed - 09/30/2021  4:55 PM      Failed - This refill cannot be delegated      Failed - Urine Drug Screen completed in last 360 days      Passed - Valid encounter within last 3 months    Recent Outpatient Visits           3 weeks ago Annual physical exam   [facility] [name], MD   6 months ago Chronic, continuous use of opioids   [facility] [name], MD   1 year ago Syncope and collapse   [facility] [name], MD   1 year ago Primary hypertension   [facility] [name], MD   3 years ago Annual physical exam   [facility] [name], MD       Future Appointments             Tomorrow [name], MD [facility]               Requested Prescriptions  Pending Prescriptions Disp Refills   oxyCODONE (ROXICODONE) 15 MG immediate release tablet 120 tablet 0    Sig: Take 1 tablet (15 mg total) by mouth 4 (four) times daily as needed for pain.     Not Delegated - Analgesics:  Opioid Agonists Failed - 09/30/2021  4:55 PM      Failed - This refill cannot be delegated      Failed - Urine Drug Screen completed in last 360 days      Passed - Valid encounter within last 3 months    Recent Outpatient Visits           3 weeks ago Annual physical exam   [facility] [name], MD   6 months ago Chronic, continuous use of opioids   [facility] [name], MD   1 year ago Syncope and collapse   [facility] [name], MD   1 year ago Primary hypertension   [facility] [name], MD   3 years ago Annual physical exam   [place], [name], MD       Future Appointments             Tomorrow Caryn Section, [name], MD [facility] [place]

## 2021-10-02 ENCOUNTER — Ambulatory Visit: Payer: Medicare HMO | Admitting: Family Medicine

## 2021-10-02 MED ORDER — OXYCODONE HCL 15 MG PO TABS: 15.0000 mg | ORAL_TABLET | Freq: Four times a day (QID) | ORAL | 0 refills | Status: DC | PRN

## 2021-10-02 NOTE — Progress Notes (Deleted)
I,Jana Robinson,acting as a scribe for [name], MD.,have documented all relevant documentation on the behalf of [name], MD,as directed by  [name], MD while in the presence of [name], MD.   Established patient visit   Patient: Justin Harrington   DOB: 12/21/1945   76 y.o. Male  MRN: [id number] Visit Date: 10/02/2021  Today's healthcare provider: [name], MD   No chief complaint on file.  Subjective    HPI  Hypertension, follow-up  BP Readings from Last 3 Encounters:  09/04/21 (!) 168/105  03/30/21 (!) 197/99  12/21/19 (!) 155/81   Wt Readings from Last 3 Encounters:  09/04/21 162 lb 6.4 oz (73.7 kg)  03/30/21 165 lb (74.8 kg)  12/21/19 157 lb (71.2 kg)     He was last seen for hypertension 1 months ago.  BP at that visit was 168/105. Management since that visit includes continue current medication and take consistently.  He reports {excellent/good/fair/poor:19665} compliance with treatment. He {is/is not:9024} having side effects. He is following a {diet:21022986} diet. He {is/is not:9024} exercising. He {does/does not:200015} smoke.  Use of agents associated with hypertension: {bp agents assoc with hypertension:511::"none"}.   Outside blood pressures are  Symptoms: {Yes/No:20286} chest pain {Yes/No:20286} chest pressure  {Yes/No:20286} palpitations {Yes/No:20286} syncope  {Yes/No:20286} dyspnea {Yes/No:20286} orthopnea  {Yes/No:20286} paroxysmal nocturnal dyspnea {Yes/No:20286} lower extremity edema   Pertinent labs Lab Results  Component Value Date   CHOL 148 09/08/2021   HDL 53 09/08/2021   LDLCALC 80 09/08/2021   TRIG 77 09/08/2021   CHOLHDL 2.8 09/08/2021   Lab Results  Component Value Date   NA 137 09/08/2021   K 3.8 09/08/2021   CREATININE 1.50 (H) 09/08/2021   EGFR 48 (L) 09/08/2021   GLUCOSE 117 (H) 09/08/2021   TSH 1.640 09/08/2021     The 10-year ASCVD risk score (Arnett DK, et al., 2019) is:  40.7%  --------------------------------------------------------------------------------------------------- Follow up for grieving   The patient was last seen for this 1 months ago. Changes made at last visit include Fluoxetine 20 mg daily.  He reports {excellent/good/fair/poor:19665} compliance with treatment. He feels that condition is {improved/worse/unchanged:3041574}. He {is/is not:21021397} having side effects. ***  -----------------------------------------------------------------------------------------   Medications: Outpatient Medications Prior to Visit  Medication Sig   acetaminophen (TYLENOL) 325 MG tablet Take 1-2 tablets (325-650 mg total) by mouth every 6 (six) hours as needed for mild pain (or temp >/= 101 F). (Patient not taking: Reported on 03/30/2021)   amLODipine (NORVASC) 2.5 MG tablet Take 1 tablet (2.5 mg total) by mouth daily. (Patient not taking: Reported on 09/04/2021)   atorvastatin (LIPITOR) 80 MG tablet TAKE 1 TABLET(80 MG) BY MOUTH DAILY. (Patient not taking: Reported on 09/04/2021)   clopidogrel (PLAVIX) 75 MG tablet Take 1 tablet (75 mg total) by mouth daily. (Patient not taking: Reported on 01/28/2021)   FLUoxetine (PROZAC) 20 MG capsule Take 1 capsule (20 mg total) by mouth daily.   hydrochlorothiazide (HYDRODIURIL) 25 MG tablet TAKE 1 TABLET(25 MG) BY MOUTH DAILY   lubiprostone (AMITIZA) 24 MCG capsule Take 1 capsule (24 mcg total) by mouth 2 (two) times daily with a meal. (Patient not taking: Reported on 01/28/2020)   oxyCODONE (ROXICODONE) 15 MG immediate release tablet Take 1 tablet (15 mg total) by mouth 4 (four) times daily as needed for pain.   polyethylene glycol (MIRALAX / GLYCOLAX) 17 g packet Take 17 g by mouth daily as needed. (Patient not taking: Reported on 01/28/2021)   Sennosides (EX-LAX  PO) Take 3-4 tablets by mouth every 3 (three) days as needed (constipation).  (Patient not taking: Reported on 01/28/2021)   traZODone (DESYREL) 150 MG tablet  Take 1 tablet (150 mg total) by mouth at bedtime as needed for sleep. Pt given enough refill to last until upcoming appt (Patient not taking: Reported on 09/04/2021)   No facility-administered medications prior to visit.    Review of Systems  {Labs  Heme  Chem  Endocrine  Serology  Results Review (optional):23779}   Objective    There were no vitals taken for this visit. {Show previous vital signs (optional):23777}  Physical Exam  ***  No results found for any visits on 10/02/21.  Assessment & Plan     ***  No follow-ups on file.      {provider attestation***:1}   [name], MD  [facility] [phone] (phone) [phone] (fax)  [place]

## 2021-10-08 ENCOUNTER — Other Ambulatory Visit: Payer: Self-pay | Admitting: Family Medicine

## 2021-10-08 ENCOUNTER — Telehealth: Payer: Self-pay | Admitting: Family Medicine

## 2021-10-08 DIAGNOSIS — G8929 Other chronic pain: Secondary | ICD-10-CM

## 2021-10-08 NOTE — Telephone Encounter (Signed)
Pt has called back... states he has been waiting at the pharmacy for his med and is in withdrawal, having symptoms, and in a bad way. Encouraged him to return home and have pharmacy call  him but he will sit there till Dr Caryn Section calls in med. [phone]

## 2021-10-08 NOTE — Telephone Encounter (Signed)
Medication Refill - Medication: oxyCODONE (ROXICODONE) 15 MG immediate release tablet  Has the patient contacted their pharmacy? Yes.   (Agent: If no, request that the patient contact the pharmacy for the refill. If patient does not wish to contact the pharmacy document the reason why and proceed with request.) (Agent: If yes, when and what did the pharmacy advise?)  Preferred Pharmacy (with phone number or street name): [facility] #[id number] - [place], [place] [address] AT [place] Has the patient been seen for an appointment in the last year OR does the patient have an upcoming appointment? Yes.  Patient stated that his refill isn't until tomorrow the 8th but he is so much pain that he wants to know if Dr. Caryn Section can refill it earlier. Please contact patient.  Agent: Please be advised that RX refills may take up to 3 business days. We ask that you follow-up with your pharmacy.

## 2021-10-08 NOTE — Telephone Encounter (Unsigned)
Copied from [place] [phone]. Topic: General - Other >> Oct 08, 2021 12:31 PM Justin Harrington wrote: Reason for CRM: The patient would like to speak with a member of clinical staff regarding their refill of oxyCODONE (ROXICODONE) 15 MG immediate release tablet [[id number]]   The patient would like to know if the medication can be called in a day early   Please contact further when possible

## 2021-10-08 NOTE — Telephone Encounter (Signed)
Patient called to discuss the refill request. He says he is out of the pain medicine and the pharmacy said he can pick it up tomorrow and to call the office to ask for Dr. Caryn Section to authorize a pickup early and to have someone from the office to call the pharmacy to give that approval. I asked is he taking more than prescribed, he says no, but he may have taken 1 or 2 more depending on the pain. He said he was wanting to ask if Dr. Caryn Section could give him more than what it's been. I advised I will send this to Dr. Caryn Section and someone will call back with his recommendation. Patient verbalized understanding and says that he really needs it today if possible to prevent withdrawals.

## 2021-10-08 NOTE — Telephone Encounter (Signed)
Prescription was sent in on 10/02/2021. I called pharmacy to see if patient was able to pick up prescription. Pharmacist informed me that the medication had to be ordered due to them being out of stock. Per pharmacy, patient's prescription should arrive tomorrow (10/09/2021). I called patient and gave him this update. Patient says he took his last dose this morning and he is already having withdrawals. He states it feels like his skin is "crawling". Patient wants to know if something can be prescribed for him to take today to get by until his prescription is ready tomorrow?

## 2021-10-08 NOTE — Addendum Note (Signed)
Addended by: [name] E on: 10/08/2021 01:16 PM   Modules accepted: Orders

## 2021-10-08 NOTE — Telephone Encounter (Signed)
I can send in a prescription for a couple of percocet 7.5/325 or 10/325, but need to check with pharmacy 1st to see if they have any in stock.

## 2021-10-08 NOTE — Telephone Encounter (Signed)
Pt would like a call back about his question of whether he can get his pain medicine early.  CB@  [phone]

## 2021-10-09 NOTE — Telephone Encounter (Signed)
Requested medication (s) are due for refill today - no  Requested medication (s) are on the active medication list -yes  Future visit scheduled -no  Last refill: 10/02/21 #120  Notes to clinic: non delegated Rx- duplicate request  Requested Prescriptions  Pending Prescriptions Disp Refills   oxyCODONE (ROXICODONE) 15 MG immediate release tablet 120 tablet 0    Sig: Take 1 tablet (15 mg total) by mouth 4 (four) times daily as needed for pain.     Not Delegated - Analgesics:  Opioid Agonists Failed - 10/08/2021 11:25 AM      Failed - This refill cannot be delegated      Failed - Urine Drug Screen completed in last 360 days      Passed - Valid encounter within last 3 months    Recent Outpatient Visits           1 month ago Annual physical exam   [facility] [name], MD   6 months ago Chronic, continuous use of opioids   [facility] [name], MD   1 year ago Syncope and collapse   [facility] [name], MD   1 year ago Primary hypertension   [facility] [name], MD   3 years ago Annual physical exam   [facility] [name], MD                 Requested Prescriptions  Pending Prescriptions Disp Refills   oxyCODONE (ROXICODONE) 15 MG immediate release tablet 120 tablet 0    Sig: Take 1 tablet (15 mg total) by mouth 4 (four) times daily as needed for pain.     Not Delegated - Analgesics:  Opioid Agonists Failed - 10/08/2021 11:25 AM      Failed - This refill cannot be delegated      Failed - Urine Drug Screen completed in last 360 days      Passed - Valid encounter within last 3 months    Recent Outpatient Visits           1 month ago Annual physical exam   [facility] [name], MD   6 months ago Chronic, continuous use of opioids   [facility] [name], MD   1 year ago Syncope and collapse   [facility] [name], MD   1 year ago Primary hypertension   [facility] [name], MD   3 years ago Annual physical exam   [facility] [name], MD

## 2021-10-22 ENCOUNTER — Other Ambulatory Visit: Payer: Self-pay | Admitting: Family Medicine

## 2021-10-22 DIAGNOSIS — F4321 Adjustment disorder with depressed mood: Secondary | ICD-10-CM

## 2021-10-26 ENCOUNTER — Ambulatory Visit: Admission: RE | Admit: 2021-10-26 | Payer: Medicare HMO | Source: Ambulatory Visit

## 2021-10-29 ENCOUNTER — Other Ambulatory Visit: Payer: Self-pay | Admitting: Family Medicine

## 2021-10-29 DIAGNOSIS — G8929 Other chronic pain: Secondary | ICD-10-CM

## 2021-10-29 NOTE — Telephone Encounter (Signed)
Medication Refill - Medication: oxyCODONE (ROXICODONE) 15 MG immediate release tablet  Has the patient contacted their pharmacy? No. Pt calls every month.  Preferred Pharmacy (with phone number or street name): [facility] #[id number] - [place], [place] [address] AT [place]  Has the patient been seen for an appointment in the last year OR does the patient have an upcoming appointment? Yes.    Agent: Please be advised that RX refills may take up to 3 business days. We ask that you follow-up with your pharmacy.

## 2021-10-29 NOTE — Telephone Encounter (Signed)
Requested medication (s) are due for refill today: Due 11/01/21  Requested medication (s) are on the active medication list: yes    Last refill: 10/02/21  #120 0 refills  Future visit scheduled no  Notes to clinic:Not delegated, please review.  Requested Prescriptions  Pending Prescriptions Disp Refills   oxyCODONE (ROXICODONE) 15 MG immediate release tablet 120 tablet 0    Sig: Take 1 tablet (15 mg total) by mouth 4 (four) times daily as needed for pain.     Not Delegated - Analgesics:  Opioid Agonists Failed - 10/29/2021 11:15 AM      Failed - This refill cannot be delegated      Failed - Urine Drug Screen completed in last 360 days      Passed - Valid encounter within last 3 months    Recent Outpatient Visits           1 month ago Annual physical exam   [facility] [name], MD   7 months ago Chronic, continuous use of opioids   [facility] [name], MD   1 year ago Syncope and collapse   [facility] [name], MD   1 year ago Primary hypertension   [facility] [name], MD   3 years ago Annual physical exam   [facility] [name], MD

## 2021-10-30 MED ORDER — OXYCODONE HCL 15 MG PO TABS: 15.0000 mg | ORAL_TABLET | Freq: Four times a day (QID) | ORAL | 0 refills | Status: DC | PRN

## 2021-11-02 ENCOUNTER — Observation Stay: Payer: Medicare HMO

## 2021-11-02 ENCOUNTER — Other Ambulatory Visit: Payer: Self-pay

## 2021-11-02 ENCOUNTER — Observation Stay
Admission: EM | Admit: 2021-11-02 | Discharge: 2021-11-03 | Disposition: A | Discharge status: Home / Self Care | Payer: Medicare HMO | Attending: Internal Medicine | Admitting: Internal Medicine

## 2021-11-02 ENCOUNTER — Emergency Department: Payer: Medicare HMO

## 2021-11-02 DIAGNOSIS — I639 Cerebral infarction, unspecified: Secondary | ICD-10-CM | POA: Diagnosis not present

## 2021-11-02 DIAGNOSIS — I5032 Chronic diastolic (congestive) heart failure: Secondary | ICD-10-CM | POA: Insufficient documentation

## 2021-11-02 DIAGNOSIS — F17210 Nicotine dependence, cigarettes, uncomplicated: Secondary | ICD-10-CM | POA: Diagnosis not present

## 2021-11-02 DIAGNOSIS — I130 Hypertensive heart and chronic kidney disease with heart failure and stage 1 through stage 4 chronic kidney disease, or unspecified chronic kidney disease: Secondary | ICD-10-CM | POA: Diagnosis not present

## 2021-11-02 DIAGNOSIS — I63331 Cerebral infarction due to thrombosis of right posterior cerebral artery: Secondary | ICD-10-CM | POA: Diagnosis not present

## 2021-11-02 DIAGNOSIS — H34832 Tributary (branch) retinal vein occlusion, left eye, with macular edema: Secondary | ICD-10-CM | POA: Diagnosis not present

## 2021-11-02 DIAGNOSIS — Z86718 Personal history of other venous thrombosis and embolism: Secondary | ICD-10-CM | POA: Diagnosis not present

## 2021-11-02 DIAGNOSIS — Z7902 Long term (current) use of antithrombotics/antiplatelets: Secondary | ICD-10-CM | POA: Diagnosis not present

## 2021-11-02 DIAGNOSIS — Z8679 Personal history of other diseases of the circulatory system: Secondary | ICD-10-CM

## 2021-11-02 DIAGNOSIS — Z79899 Other long term (current) drug therapy: Secondary | ICD-10-CM | POA: Insufficient documentation

## 2021-11-02 DIAGNOSIS — Z955 Presence of coronary angioplasty implant and graft: Secondary | ICD-10-CM | POA: Insufficient documentation

## 2021-11-02 DIAGNOSIS — Z7982 Long term (current) use of aspirin: Secondary | ICD-10-CM | POA: Diagnosis not present

## 2021-11-02 DIAGNOSIS — R41841 Cognitive communication deficit: Secondary | ICD-10-CM | POA: Insufficient documentation

## 2021-11-02 DIAGNOSIS — H538 Other visual disturbances: Secondary | ICD-10-CM

## 2021-11-02 DIAGNOSIS — I2510 Atherosclerotic heart disease of native coronary artery without angina pectoris: Secondary | ICD-10-CM | POA: Diagnosis not present

## 2021-11-02 DIAGNOSIS — Z9889 Other specified postprocedural states: Secondary | ICD-10-CM | POA: Insufficient documentation

## 2021-11-02 DIAGNOSIS — I10 Essential (primary) hypertension: Secondary | ICD-10-CM | POA: Diagnosis not present

## 2021-11-02 DIAGNOSIS — R2689 Other abnormalities of gait and mobility: Secondary | ICD-10-CM | POA: Insufficient documentation

## 2021-11-02 DIAGNOSIS — Z79891 Long term (current) use of opiate analgesic: Secondary | ICD-10-CM

## 2021-11-02 DIAGNOSIS — Z853 Personal history of malignant neoplasm of breast: Secondary | ICD-10-CM | POA: Insufficient documentation

## 2021-11-02 DIAGNOSIS — I6523 Occlusion and stenosis of bilateral carotid arteries: Secondary | ICD-10-CM | POA: Diagnosis not present

## 2021-11-02 DIAGNOSIS — E785 Hyperlipidemia, unspecified: Secondary | ICD-10-CM | POA: Diagnosis present

## 2021-11-02 DIAGNOSIS — I719 Aortic aneurysm of unspecified site, without rupture: Secondary | ICD-10-CM | POA: Insufficient documentation

## 2021-11-02 DIAGNOSIS — I63531 Cerebral infarction due to unspecified occlusion or stenosis of right posterior cerebral artery: Secondary | ICD-10-CM | POA: Diagnosis not present

## 2021-11-02 DIAGNOSIS — N1831 Chronic kidney disease, stage 3a: Secondary | ICD-10-CM | POA: Insufficient documentation

## 2021-11-02 DIAGNOSIS — Z9079 Acquired absence of other genital organ(s): Secondary | ICD-10-CM

## 2021-11-02 DIAGNOSIS — Z95828 Presence of other vascular implants and grafts: Secondary | ICD-10-CM | POA: Insufficient documentation

## 2021-11-02 DIAGNOSIS — E7800 Pure hypercholesterolemia, unspecified: Secondary | ICD-10-CM

## 2021-11-02 DIAGNOSIS — F4321 Adjustment disorder with depressed mood: Secondary | ICD-10-CM

## 2021-11-02 LAB — DIFFERENTIAL
Abs Immature Granulocytes: 0.05 10*3/uL (ref 0.00–0.07)
Basophils Absolute: 0 10*3/uL (ref 0.0–0.1)
Basophils Relative: 0 %
Eosinophils Absolute: 0.1 10*3/uL (ref 0.0–0.5)
Eosinophils Relative: 1 %
Immature Granulocytes: 0 %
Lymphocytes Relative: 17 %
Lymphs Abs: 2 10*3/uL (ref 0.7–4.0)
Monocytes Absolute: 1 10*3/uL (ref 0.1–1.0)
Monocytes Relative: 8 %
Neutro Abs: 8.3 10*3/uL — ABNORMAL HIGH (ref 1.7–7.7)
Neutrophils Relative %: 74 %

## 2021-11-02 LAB — APTT: aPTT: 26 seconds (ref 24–36)

## 2021-11-02 LAB — CBC
HCT: 48.6 % (ref 39.0–52.0)
Hemoglobin: 16.3 g/dL (ref 13.0–17.0)
MCH: 30.9 pg (ref 26.0–34.0)
MCHC: 33.5 g/dL (ref 30.0–36.0)
MCV: 92.2 fL (ref 80.0–100.0)
Platelets: 188 10*3/uL (ref 150–400)
RBC: 5.27 MIL/uL (ref 4.22–5.81)
RDW: 12.7 % (ref 11.5–15.5)
WBC: 11.4 10*3/uL — ABNORMAL HIGH (ref 4.0–10.5)
nRBC: 0 % (ref 0.0–0.2)

## 2021-11-02 LAB — COMPREHENSIVE METABOLIC PANEL
ALT: 11 U/L (ref 0–44)
AST: 20 U/L (ref 15–41)
Albumin: 4.3 g/dL (ref 3.5–5.0)
Alkaline Phosphatase: 60 U/L (ref 38–126)
Anion gap: 9 (ref 5–15)
BUN: 18 mg/dL (ref 8–23)
CO2: 30 mmol/L (ref 22–32)
Calcium: 9.5 mg/dL (ref 8.9–10.3)
Chloride: 97 mmol/L — ABNORMAL LOW (ref 98–111)
Creatinine, Ser: 1.63 mg/dL — ABNORMAL HIGH (ref 0.61–1.24)
GFR, Estimated: 44 mL/min — ABNORMAL LOW (ref >60)
Glucose, Bld: 111 mg/dL — ABNORMAL HIGH (ref 70–99)
Potassium: 3.8 mmol/L (ref 3.5–5.1)
Sodium: 136 mmol/L (ref 135–145)
Total Bilirubin: 1.1 mg/dL (ref 0.3–1.2)
Total Protein: 8.2 g/dL — ABNORMAL HIGH (ref 6.5–8.1)

## 2021-11-02 LAB — ETHANOL: Alcohol, Ethyl (B): <10 mg/dL (ref <10)

## 2021-11-02 LAB — PROTIME-INR
INR: 1 (ref 0.8–1.2)
Prothrombin Time: 12.8 seconds (ref 11.4–15.2)

## 2021-11-02 MED ORDER — ASPIRIN 81 MG PO TBEC
81.0000 mg | DELAYED_RELEASE_TABLET | Freq: Every day | ORAL | Status: DC
  Administered 2021-11-03: 81 mg via ORAL
  Filled 2021-11-02: qty 1

## 2021-11-02 MED ORDER — FLUOXETINE HCL 20 MG PO CAPS
20.0000 mg | ORAL_CAPSULE | Freq: Every day | ORAL | Status: DC
  Administered 2021-11-03: 20 mg via ORAL
  Filled 2021-11-02: qty 1

## 2021-11-02 MED ORDER — ACETAMINOPHEN 160 MG/5ML PO SOLN: 650.0000 mg | ORAL | Status: DC | PRN

## 2021-11-02 MED ORDER — ACETAMINOPHEN 650 MG RE SUPP: 650.0000 mg | RECTAL | Status: DC | PRN

## 2021-11-02 MED ORDER — OXYCODONE HCL 5 MG PO TABS: 15.0000 mg | ORAL_TABLET | Freq: Four times a day (QID) | ORAL | Status: DC | PRN

## 2021-11-02 MED ORDER — ATORVASTATIN CALCIUM 20 MG PO TABS
80.0000 mg | ORAL_TABLET | Freq: Every day | ORAL | Status: DC
  Administered 2021-11-03: 80 mg via ORAL
  Filled 2021-11-02: qty 4

## 2021-11-02 MED ORDER — ENOXAPARIN SODIUM 40 MG/0.4ML IJ SOSY: 40.0000 mg | PREFILLED_SYRINGE | INTRAMUSCULAR | Status: DC

## 2021-11-02 MED ORDER — STROKE: EARLY STAGES OF RECOVERY BOOK: Freq: Once | Status: DC

## 2021-11-02 MED ORDER — ASPIRIN 325 MG PO TABS
325.0000 mg | ORAL_TABLET | Freq: Once | ORAL | Status: AC
  Administered 2021-11-02: 325 mg via ORAL
  Filled 2021-11-02: qty 1

## 2021-11-02 MED ORDER — ACETAMINOPHEN 325 MG PO TABS: 650.0000 mg | ORAL_TABLET | ORAL | Status: DC | PRN

## 2021-11-02 MED ORDER — CLOPIDOGREL BISULFATE 75 MG PO TABS: 75.0000 mg | ORAL_TABLET | Freq: Every day | ORAL | Status: DC

## 2021-11-02 NOTE — Assessment & Plan Note (Addendum)
Continue Lipitor and Plavix

## 2021-11-02 NOTE — Assessment & Plan Note (Signed)
Continue oxycodone 

## 2021-11-02 NOTE — Assessment & Plan Note (Signed)
At baseline 

## 2021-11-02 NOTE — ED Provider Notes (Signed)
[facility] Provider Note   Event Date/Time   First MD Initiated Contact with Patient 11/02/21 2001     (approximate) History  Eye Problem  HPI Justin Harrington is a 76 y.o. male with a stated past medical history of hypertension who presents for blurred vision over the last 5 days that has been slightly worsened since onset.  Patient was seen by ophthalmology prior to arrival and sent to our emergency department to rule out possible stroke.  Patient denies any history of similar symptoms in the past ROS: Patient currently denies any vision changes, tinnitus, difficulty speaking, facial droop, sore throat, chest pain, shortness of breath, abdominal pain, nausea/vomiting/diarrhea, dysuria, or weakness/numbness/paresthesias in any extremity   Physical Exam  Triage Vital Signs: ED Triage Vitals  Enc Vitals Group     BP 11/02/21 1748 (!) 163/106     Pulse Rate 11/02/21 1748 88     Resp 11/02/21 1755 16     Temp 11/02/21 1748 98.1 F (36.7 C)     Temp Source 11/02/21 1748 Oral     SpO2 11/02/21 1748 96 %     Weight 11/02/21 1753 165 lb (74.8 kg)     Height 11/02/21 1753 '6\' 1"'$  (1.854 m)     Head Circumference --      Peak Flow --      Pain Score 11/02/21 1753 0     Pain Loc --      Pain Edu? --      Excl. in [place]? --    Most recent vital signs: Vitals:   11/02/21 1755 11/02/21 2205  BP:    Pulse:    Resp: 16   Temp:  98 F (36.7 C)  SpO2:     General: Awake, oriented x4. CV:  Good peripheral perfusion.  Resp:  Normal effort.  Abd:  No distention.  Other:  Caucasian sitting in chair next to bed in no acute distress.  Decreased visual acuity ED Results / Procedures / Treatments  Labs (all labs ordered are listed, but only abnormal results are displayed) Labs Reviewed  CBC - Abnormal; Notable for the following components:      Result Value   WBC 11.4 (*)    All other components within normal limits  DIFFERENTIAL - Abnormal; Notable for the  following components:   Neutro Abs 8.3 (*)    All other components within normal limits  COMPREHENSIVE METABOLIC PANEL - Abnormal; Notable for the following components:   Chloride 97 (*)    Glucose, Bld 111 (*)    Creatinine, Ser 1.63 (*)    Total Protein 8.2 (*)    GFR, Estimated 44 (*)    All other components within normal limits  PROTIME-INR  APTT  ETHANOL  LIPID PANEL  HEMOGLOBIN A1C  CBC  CREATININE, SERUM   EKG ED ECG REPORT I, Naaman Plummer, the attending physician, personally viewed and interpreted this ECG. Date: 11/02/2021 EKG Time: 1756 Rate: 94 Rhythm: normal sinus rhythm QRS Axis: normal Intervals: normal ST/T Wave abnormalities: normal Narrative Interpretation: no evidence of acute ischemia RADIOLOGY ED MD interpretation: CT of the head without contrast shows acute to subacute right occipital infarct without evidence of hemorrhagic transformation -Agree with radiology assessment Official radiology report(s): CT HEAD WO CONTRAST  Result Date: 11/02/2021 CLINICAL DATA:  Blurred vision for 5 days left greater than right EXAM: CT HEAD WITHOUT CONTRAST TECHNIQUE: Contiguous axial images were obtained from the base of the skull through  the vertex without intravenous contrast. RADIATION DOSE REDUCTION: This exam was performed according to the departmental dose-optimization program which includes automated exposure control, adjustment of the mA and/or kV according to patient size and/or use of iterative reconstruction technique. COMPARISON:  12/12/2019 FINDINGS: Brain: Hypodensity within the right occipital lobe consistent with acute to subacute infarct. No evidence of acute hemorrhage. Lateral ventricles and midline structures are unremarkable. No acute extra-axial fluid collections. No mass effect. Vascular: No hyperdense vessel or unexpected calcification. Skull: Normal. Negative for fracture or focal lesion. Sinuses/Orbits: No acute finding. Other: None. IMPRESSION: 1.  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 No evidence of hemorrhagic transformation. Further evaluation with MRI could better assess the acuity. Electronically Signed   By: Randa Ngo M.D.   On: 11/02/2021 19:13   PROCEDURES: Critical Care performed: Yes, see critical care procedure note(s) .1-3 Lead EKG Interpretation  Performed by: Naaman Plummer, MD Authorized by: Naaman Plummer, MD     Interpretation: normal     ECG rate:  87   ECG rate assessment: normal     Rhythm: sinus rhythm     Ectopy: none     Conduction: normal   CRITICAL CARE Performed by: Naaman Plummer  Total critical care time: 37 minutes  Critical care time was exclusive of separately billable procedures and treating other patients.  Critical care was necessary to treat or prevent imminent or life-threatening deterioration.  Critical care was time spent personally by me on the following activities: development of treatment plan with patient and/or surrogate as well as nursing, discussions with consultants, evaluation of patient's response to treatment, examination of patient, obtaining history from patient or surrogate, ordering and performing treatments and interventions, ordering and review of laboratory studies, ordering and review of radiographic studies, pulse oximetry and re-evaluation of patient's condition.  MEDICATIONS ORDERED IN ED: Medications  oxyCODONE (Oxy IR/ROXICODONE) immediate release tablet 15 mg (has no administration in time range)  atorvastatin (LIPITOR) tablet 80 mg (has no administration in time range)  FLUoxetine (PROZAC) capsule 20 mg (has no administration in time range)   stroke: early stages of recovery book (has no administration in time range)  acetaminophen (TYLENOL) tablet 650 mg (has no administration in time range)    Or  acetaminophen (TYLENOL) 160 MG/5ML solution 650 mg (has no administration in time range)    Or  acetaminophen (TYLENOL) suppository 650 mg (has no  administration in time range)  enoxaparin (LOVENOX) injection 40 mg (has no administration in time range)  aspirin EC tablet 81 mg (has no administration in time range)  aspirin tablet 325 mg (325 mg Oral Given 11/02/21 2203)   IMPRESSION / MDM / ASSESSMENT AND PLAN / ED COURSE  I reviewed the triage vital signs and the nursing notes.                             The patient is on the cardiac monitor to evaluate for evidence of arrhythmia and/or significant heart rate changes. Patient's presentation is most consistent with acute presentation with potential threat to life or bodily function. Patient is a 76 year old male who presents with symptoms concerning for CVA PMH risk factors: Hypertension Neurologic Deficits: Blurred vision Last known Well Time: 5 days prior to arrival NIH Stroke Score: 1 Given History and Exam I have lower suspicion for infectious etiology, neurologic changes secondary to toxicologic ingestion, seizure, complex migraine. Presentation concerning for possible stroke requiring workup.  Workup: Labs: POC glucose, CBC, BMP, LFTs, Troponin, PT/INR, PTT, Type and Screen Other Diagnostics: ECG, CXR,  non-contrast head CT that shows acute to subacute occipital infarction  Consult: hospitalist Disposition: Admit   FINAL CLINICAL IMPRESSION(S) / ED DIAGNOSES   Final diagnoses:  Occipital cerebral infarction ([place])  Blurred vision, bilateral   Rx / DC Orders   ED Discharge Orders     None      Note:  This document was prepared using Dragon voice recognition software and may include unintentional dictation errors.   Naaman Plummer, MD 11/03/21 [phone]

## 2021-11-02 NOTE — Assessment & Plan Note (Signed)
No acute issues.

## 2021-11-02 NOTE — H&P (Signed)
History and Physical    Patient: Justin Harrington ZOX:[id number] DOB: [birth year] DOA: 11/02/2021 DOS: the patient was seen and examined on 11/02/2021 PCP: [name], MD  Patient coming from: Home  Chief Complaint:  Chief Complaint  Patient presents with   Eye Problem    HPI: Justin Harrington is a 76 y.o. 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  He saw his eye doctor who sent him over to the ED to be evaluated for possible stroke.  He denied any changes in his speech and denied headaches.  Denied weakness or numbness in the face arm or legs.  Patient admits to not taking his meds as he should since his wife died back in 04-25-2022 ED course and data review: BP 163/106 with otherwise normal vitals.  Labs significant for WBC 11.4 and creatinine at baseline of 1.63 EKG, personally viewed and interpreted showed NSR at 94 with nonspecific ST-T wave changes.  CT head showed the following: IMPRESSION: 1. 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 No evidence of hemorrhagic transformation. Further evaluation with MRI could better assess the acuity  Patient was given at 325 mg aspirin tablet and hospitalist consulted for admission.   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 No hemorrhage  or mass effect.  Review of Systems: As mentioned in the history of present illness. All other systems reviewed and are negative.  Past Medical History:  Diagnosis Date   Aortic aneurysm ([place])    Coronary artery disease    DVT (deep venous thrombosis) (HCC)    H/O DVT in left leg, filter placed approximatly 8 yrs ago   H/O spinal fusion 1998   Back pain   Hypertension    controlled on meds   Personal history of tobacco use, presenting hazards to health 06/12/2015   Personal history of tobacco use, presenting hazards to health 06/12/2015   Prostate cancer [facility])    Had prostatectomy   Wears dentures    upper   Past Surgical History:  Procedure Laterality Date   BACK SURGERY  04-24-96   Lumbar spine fusion   carotid doppler ultrasound  10/05/2011   75-90% RCA occlusion, 50% on left   CAROTID ENDARTERECTOMY Right 12/02/2011   Dr. Lucky Cowboy   CHOLECYSTECTOMY  10/02/2010   Laparoscopic, Dr. Pat Patrick, [facility]   COLONOSCOPY WITH PROPOFOL N/A 06/20/2017   Procedure: COLONOSCOPY WITH PROPOFOL;  Surgeon: [name], MD;  Location: [place];  Service: Endoscopy;  Laterality: N/A;   CT SCAN  10/02/2010   [facility]; Infrarenal suprailiac abdominal aortic aneurysm maximum dimention of nearly 4cm. Cholelithiasis and acute cholecystiis. Mild enlargement of left adrenal gland   ENDOVASCULAR REPAIR/STENT GRAFT N/A 03/29/2018   Procedure: ENDOVASCULAR REPAIR/STENT GRAFT;  Surgeon: Algernon Huxley, MD;  Location: [place] [facility];  Service: Cardiovascular;  Laterality: N/A;   LUMBAR SPINE HARDWARE  REMOVAL  07/30/2016   [name], MD   PROSTATE SURGERY  10/19/2012   prostatectomy. [facility]; Laser assisted, Done by [name] for prostate cancer   Social History:  reports that he has been smoking cigarettes. He has a 25.00 pack-year smoking history. He has never used smokeless tobacco. He reports that he does not drink alcohol and does not use drugs.  Allergies  Allergen Reactions    Zolpidem     Other reaction(s): Other (See Comments) GOT UP IN MIDDLE OF NIGHT AND FILLED ALL THE GLASSES WITH FLOUR    Family History  Problem Relation Age of Onset   Heart attack Mother    Congestive Heart Failure Father    Cerebral palsy Son     Prior to Admission medications   Medication Sig Start Date End Date Taking? Authorizing Provider  acetaminophen (TYLENOL) 325 MG tablet Take 1-2 tablets (325-650 mg total) by mouth every 6 (six) hours as needed for mild pain (or temp >/= 101 F). Patient not taking: Reported on 03/30/2021 03/30/18   Stegmayer, Joelene Millin A, PA-C  amLODipine (NORVASC) 2.5 MG tablet Take 1 tablet (2.5 mg total) by mouth daily. Patient not taking: Reported on 09/04/2021 03/30/21   [name], MD  atorvastatin (LIPITOR) 80 MG tablet TAKE 1 TABLET(80 MG) BY MOUTH DAILY. Patient not taking: Reported on 09/04/2021 03/06/21   [name], MD  clopidogrel (PLAVIX) 75 MG tablet Take 1 tablet (75 mg total) by mouth daily. Patient not taking: Reported on 01/28/2021 12/19/19   Kris Hartmann, NP  FLUoxetine (PROZAC) 20 MG capsule Take 1 capsule (20 mg total) by mouth daily. 09/04/21   [name], MD  hydrochlorothiazide (HYDRODIURIL) 25 MG tablet TAKE 1 TABLET(25 MG) BY MOUTH DAILY 05/29/21   [name], MD  lubiprostone (AMITIZA) 24 MCG capsule Take 1 capsule (24 mcg total) by mouth 2 (two) times daily with a meal. Patient not taking: Reported on 01/28/2020 11/27/19   [name], MD  oxyCODONE (ROXICODONE) 15 MG immediate release tablet Take 1 tablet (15 mg total) by mouth 4 (four) times daily as needed for pain. 10/30/21   [name], MD  polyethylene glycol (MIRALAX / GLYCOLAX) 17 g packet Take 17 g by mouth daily as needed. Patient not taking: Reported on 01/28/2021    [provider]  Sennosides (EX-LAX PO) Take 3-4 tablets by mouth every 3 (three) days as needed (constipation).  Patient not taking: Reported on 01/28/2021    [provider]  traZODone (DESYREL) 150 MG tablet Take 1 tablet (150 mg total) by mouth at bedtime as needed for sleep. Pt given enough refill to last until upcoming appt Patient not taking: Reported on 09/04/2021 01/13/21   [name], MD    Physical Exam: Vitals:   11/02/21 1748 11/02/21 1753 11/02/21 1755 11/02/21 2205  BP: (!) 163/106     Pulse: 88     Resp:   16   Temp: 98.1 F (36.7 C)   98 F (36.7 C)  TempSrc: Oral   Oral  SpO2: 96%     Weight:  74.8 kg    Height:  '6\' 1"'$  (1.854 m)     Physical Exam Vitals and nursing note reviewed.  Constitutional:      General: He is not in acute distress. HENT:     Head: Normocephalic and atraumatic.  Cardiovascular:     Rate and Rhythm: Normal rate and regular rhythm.  Heart sounds: Normal heart sounds.  Pulmonary:     Effort: Pulmonary effort is normal.     Breath sounds: Normal breath sounds.  Abdominal:     General: There is no distension.     Palpations: Abdomen is soft.  Neurological:     Mental Status: Mental status is at baseline.     Labs on Admission: I have personally reviewed following labs and imaging studies  CBC: Recent Labs  Lab 11/02/21 1810  WBC 11.4*  NEUTROABS 8.3*  HGB 16.3  HCT 48.6  MCV 92.2  PLT 160   Basic Metabolic Panel: Recent Labs  Lab 11/02/21 1810  NA 136  K 3.8  CL 97*  CO2 30  GLUCOSE 111*  BUN 18  CREATININE 1.63*  CALCIUM 9.5   GFR: Estimated Creatinine Clearance: 41.4 mL/min (A) (by C-G formula based on SCr of 1.63 mg/dL (H)). Liver Function Tests: Recent Labs  Lab 11/02/21 1810  AST 20  ALT 11  ALKPHOS 60  BILITOT 1.1  PROT 8.2*  ALBUMIN 4.3   No results for input(s): "LIPASE", "AMYLASE" in the last 168 hours. No results for input(s): "AMMONIA" in the last 168 hours. Coagulation Profile: Recent Labs  Lab 11/02/21 1810  INR 1.0   Cardiac Enzymes: No results for input(s): "CKTOTAL", "CKMB", "CKMBINDEX", "TROPONINI" in the last 168 hours. BNP  (last 3 results) No results for input(s): "PROBNP" in the last 8760 hours. HbA1C: No results for input(s): "HGBA1C" in the last 72 hours. CBG: No results for input(s): "GLUCAP" in the last 168 hours. Lipid Profile: No results for input(s): "CHOL", "HDL", "LDLCALC", "TRIG", "CHOLHDL", "LDLDIRECT" in the last 72 hours. Thyroid Function Tests: No results for input(s): "TSH", "T4TOTAL", "FREET4", "T3FREE", "THYROIDAB" in the last 72 hours. Anemia Panel: No results for input(s): "VITAMINB12", "FOLATE", "FERRITIN", "TIBC", "IRON", "RETICCTPCT" in the last 72 hours. Urine analysis:    Component Value Date/Time   COLORURINE YELLOW (A) 12/12/2019 2221   APPEARANCEUR CLEAR (A) 12/12/2019 2221   LABSPEC 1.018 12/12/2019 2221   PHURINE 5.0 12/12/2019 2221   GLUCOSEU NEGATIVE 12/12/2019 2221   HGBUR SMALL (A) 12/12/2019 2221   BILIRUBINUR NEGATIVE 12/12/2019 2221   BILIRUBINUR Negative 04/07/2018 0808   KETONESUR NEGATIVE 12/12/2019 2221   PROTEINUR NEGATIVE 12/12/2019 2221   UROBILINOGEN 0.2 04/07/2018 0808   NITRITE NEGATIVE 12/12/2019 2221   LEUKOCYTESUR NEGATIVE 12/12/2019 2221    Radiological Exams on Admission: CT HEAD WO CONTRAST  Result Date: 11/02/2021 CLINICAL DATA:  Blurred vision for 5 days left greater than right EXAM: CT HEAD WITHOUT CONTRAST TECHNIQUE: Contiguous axial images were obtained from the base of the skull through the vertex without intravenous contrast. RADIATION DOSE REDUCTION: This exam was performed according to the departmental dose-optimization program which includes automated exposure control, adjustment of the mA and/or kV according to patient size and/or use of iterative reconstruction technique. COMPARISON:  12/12/2019 FINDINGS: Brain: Hypodensity within the right occipital lobe consistent with acute to subacute infarct. No evidence of acute hemorrhage. Lateral ventricles and midline structures are unremarkable. No acute extra-axial fluid collections. No mass  effect. Vascular: No hyperdense vessel or unexpected calcification. Skull: Normal. Negative for fracture or focal lesion. Sinuses/Orbits: No acute finding. Other: None. IMPRESSION: 1. 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 No evidence of hemorrhagic transformation. Further evaluation with MRI could better assess the acuity. Electronically Signed   By: Randa Ngo M.D.   On: 11/02/2021 19:13     Data Reviewed: Relevant notes from primary care and specialist  visits, past discharge summaries as available in EHR, including Care Everywhere. 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  Add ASA '81mg'$  daily. 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 No hemorrhage or mass effect" Avoid dextrose containing fluids, Maintain euglycemia, euthermia Neuro checks q4 hrs x 24 hrs and then per shift Head of bed 30 degrees Physical therapy/Occupational therapy/Speech therapy if failed dysphagia screen Neurology consult to follow   Stage 3a chronic kidney disease (HCC) At baseline  Bilateral carotid artery stenosis with hx of right-sided carotid endarterectomy We will get carotid Doppler.  Continue Lipitor and Plavix  AAA s/p aortic stent Continue Lipitor and Plavix  History of recurrent syncopal spells Patient was hospitalized for the same 10/22/2019 and signed  out.  Followed up with neurology and had an unremarkable MRI May 2022.  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 Observation status is judged to be reasonable and necessary in order to provide the required intensity of service to ensure the patient's safety. The patient's presenting symptoms, physical exam findings, and initial radiographic and laboratory data in the context of their medical condition is felt to place them at decreased risk for further clinical deterioration. Furthermore, it is anticipated that the patient will be medically stable for discharge from the hospital within 2 midnights of admission.   Author: Athena Masse, MD 11/02/2021 10:06 PM  For on call review [email or web address].

## 2021-11-02 NOTE — Assessment & Plan Note (Addendum)
We will get carotid Doppler.  Continue Lipitor and Plavix

## 2021-11-02 NOTE — ED Provider Triage Note (Signed)
Emergency Medicine Provider Triage Evaluation Note  Justin Harrington , a 76 y.o. male  was evaluated in triage.  Pt complains of blurred vision.  Patient's been dealing with this for about 6 weeks.  Was sent over by the eye doctor as he told him he had had a stroke in the eye.  Patient denies headache.  No chest pain or shortness of breath.   states is very tired.  Review of Systems  Positive: Blurred vision Negative:   Physical Exam  BP (!) 163/106 (BP Location: Left Arm)   Pulse 88   Temp 98.1 F (36.7 C) (Oral)   Resp 16   Ht '6\' 1"'$  (1.854 m)   Wt 74.8 kg   SpO2 96%   BMI 21.77 kg/m  Gen:   Awake, no distress   Resp:  Normal effort  MSK:   Moves extremities without difficulty  Other:    Medical Decision Making  Medically screening exam initiated at 6:04 PM.  Appropriate orders placed.  Isidro Rokosz Harrington was informed that the remainder of the evaluation will be completed by another provider, this initial triage assessment does not replace that evaluation, and the importance of remaining in the ED until their evaluation is complete.  Patient appears well.  We did not call code stroke as patient has had symptoms for about 6 weeks.  States he is seeing the eye doctor 3 times and today was told to come the ED.   Versie Starks, PA-C 11/02/21 1805

## 2021-11-02 NOTE — Assessment & Plan Note (Signed)
Patient was hospitalized for the same 10/22/2019 and signed out.  Followed up with neurology and had an unremarkable MRI May 2022.

## 2021-11-02 NOTE — Assessment & Plan Note (Signed)
Continue Lipitor and Plavix

## 2021-11-02 NOTE — ED Triage Notes (Signed)
First Nurse note:  Was at eye doctors office today. C/O blurred vision x 5 days, left eye worse than right.  Denies c/o numbness.  MAE equally and strong.  Speech clear.

## 2021-11-02 NOTE — Assessment & Plan Note (Addendum)
Permissive hypertension for first 24-48 hrs post stroke onset: Prn Labetalol IV or Vasotec IV If BP greater than 220/120. Because of this is small vessel disease.  Patient suffered the death of his wife less than 6 months ago, and since that time, has not been taking his medications very consistently.  We had extensive talk about this.  He tells me that he will indeed do so.  Found to have hyperlipidemia and new prescription for Lipitor 80 mg daily given.  Not been taking blood pressure medications.  As above, allowing for permissive hypertension.  Patient still smokes and advised not to tell him this was a major contributing factor to his acute CVA.  A1c within normal limits.  Seen by speech and cleared.  Seen by PT and OT who recommended home health which was set up.  Patient will follow-up with his PCP in the next 1 month.  CVA has caused blurred vision, left greater than right.  Symptoms have slightly improved from initial presentation, but still present.  Patient was advised by myself as well as neurology not to drive until he has been formally reevaluated by his PCP and/your ophthalmologist.  He tells me he will do so.  Friend is coming to pick him up for discharge.

## 2021-11-03 ENCOUNTER — Observation Stay: Payer: Medicare HMO

## 2021-11-03 ENCOUNTER — Encounter: Payer: Self-pay | Admitting: Internal Medicine

## 2021-11-03 ENCOUNTER — Observation Stay
Admit: 2021-11-03 | Discharge: 2021-11-03 | Disposition: A | Discharge status: Home / Self Care | Payer: Medicare HMO | Attending: Internal Medicine | Admitting: Internal Medicine

## 2021-11-03 DIAGNOSIS — N1831 Chronic kidney disease, stage 3a: Secondary | ICD-10-CM

## 2021-11-03 DIAGNOSIS — I5032 Chronic diastolic (congestive) heart failure: Secondary | ICD-10-CM | POA: Diagnosis present

## 2021-11-03 DIAGNOSIS — I6389 Other cerebral infarction: Secondary | ICD-10-CM | POA: Diagnosis not present

## 2021-11-03 DIAGNOSIS — H538 Other visual disturbances: Secondary | ICD-10-CM

## 2021-11-03 DIAGNOSIS — I63233 Cerebral infarction due to unspecified occlusion or stenosis of bilateral carotid arteries: Secondary | ICD-10-CM | POA: Diagnosis not present

## 2021-11-03 DIAGNOSIS — I6523 Occlusion and stenosis of bilateral carotid arteries: Secondary | ICD-10-CM

## 2021-11-03 DIAGNOSIS — G936 Cerebral edema: Secondary | ICD-10-CM | POA: Diagnosis not present

## 2021-11-03 DIAGNOSIS — Z9889 Other specified postprocedural states: Secondary | ICD-10-CM | POA: Diagnosis not present

## 2021-11-03 DIAGNOSIS — Z8673 Personal history of transient ischemic attack (TIA), and cerebral infarction without residual deficits: Secondary | ICD-10-CM | POA: Diagnosis not present

## 2021-11-03 DIAGNOSIS — M47812 Spondylosis without myelopathy or radiculopathy, cervical region: Secondary | ICD-10-CM | POA: Diagnosis not present

## 2021-11-03 DIAGNOSIS — I639 Cerebral infarction, unspecified: Secondary | ICD-10-CM | POA: Diagnosis not present

## 2021-11-03 DIAGNOSIS — I63331 Cerebral infarction due to thrombosis of right posterior cerebral artery: Secondary | ICD-10-CM | POA: Diagnosis not present

## 2021-11-03 DIAGNOSIS — H5462 Unqualified visual loss, left eye, normal vision right eye: Secondary | ICD-10-CM | POA: Diagnosis not present

## 2021-11-03 DIAGNOSIS — I63213 Cerebral infarction due to unspecified occlusion or stenosis of bilateral vertebral arteries: Secondary | ICD-10-CM | POA: Diagnosis not present

## 2021-11-03 DIAGNOSIS — I63531 Cerebral infarction due to unspecified occlusion or stenosis of right posterior cerebral artery: Secondary | ICD-10-CM | POA: Diagnosis not present

## 2021-11-03 DIAGNOSIS — I6502 Occlusion and stenosis of left vertebral artery: Secondary | ICD-10-CM | POA: Diagnosis not present

## 2021-11-03 LAB — ECHOCARDIOGRAM COMPLETE
AR max vel: 3.03 cm2
AV Area VTI: 3.01 cm2
AV Area mean vel: 2.94 cm2
AV Mean grad: 2 mmHg
AV Peak grad: 4.2 mmHg
Ao pk vel: 1.02 m/s
Area-P 1/2: 3.77 cm2
Height: 73 in
P 1/2 time: 538 msec
S' Lateral: 3.6 cm
Weight: 2640 oz

## 2021-11-03 LAB — LIPID PANEL
Cholesterol: 200 mg/dL (ref 0–200)
HDL: 47 mg/dL (ref >40)
LDL Cholesterol: 137 mg/dL — ABNORMAL HIGH (ref 0–99)
Total CHOL/HDL Ratio: 4.3 RATIO
Triglycerides: 80 mg/dL (ref <150)
VLDL: 16 mg/dL (ref 0–40)

## 2021-11-03 LAB — HEMOGLOBIN A1C
Hgb A1c MFr Bld: 5.1 % (ref 4.8–5.6)
Mean Plasma Glucose: 99.67 mg/dL

## 2021-11-03 MED ORDER — ACETAMINOPHEN 325 MG PO TABS: 650.0000 mg | ORAL_TABLET | ORAL | Status: DC | PRN

## 2021-11-03 MED ORDER — CLOPIDOGREL BISULFATE 75 MG PO TABS: 75.0000 mg | ORAL_TABLET | Freq: Every day | ORAL | 4 refills | Status: AC

## 2021-11-03 MED ORDER — IOHEXOL 350 MG/ML SOLN
100.0000 mL | Freq: Once | INTRAVENOUS | Status: AC | PRN
  Administered 2021-11-03: 100 mL via INTRAVENOUS

## 2021-11-03 MED ORDER — AMLODIPINE BESYLATE 2.5 MG PO TABS: 2.5000 mg | ORAL_TABLET | Freq: Every day | ORAL | 1 refills | Status: DC

## 2021-11-03 MED ORDER — ACETAMINOPHEN 160 MG/5ML PO SOLN: 650.0000 mg | ORAL | Status: DC | PRN

## 2021-11-03 MED ORDER — FLUOXETINE HCL 20 MG PO CAPS: 20.0000 mg | ORAL_CAPSULE | Freq: Every day | ORAL | 1 refills | Status: DC

## 2021-11-03 MED ORDER — ATORVASTATIN CALCIUM 80 MG PO TABS: 80.0000 mg | ORAL_TABLET | Freq: Every day | ORAL | 4 refills | Status: AC

## 2021-11-03 MED ORDER — ACETAMINOPHEN 650 MG RE SUPP: 650.0000 mg | RECTAL | Status: DC | PRN

## 2021-11-03 NOTE — Evaluation (Signed)
Physical Therapy Evaluation Patient Details Name: Justin Harrington MRN: [id number] DOB: May 10, 1945 Today's Date: 11/03/2021  History of Present Illness  Lijah Bourque, Harrington is a 66yoM who comes to [facility] on 10/2 after being seen by eye doctor for several days of blurred vision. Eye doctor expressed concerns for CVA. 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 Neurology reports finding of left homonymous hemianopsia.  Clinical Impression  Pt admitted with above Dx. Pt has functional limitations due to deficits below (see "PT Problem List"). Pt able to provide details on baseline functional status. Pt confirms continued Left visual field deficits, denies any other new impairment pertaining to weakness, unsteadiness, giddiness, hemi sensory changes. Pt performs bed mobility and transfers at his baseline, AMB with modified independence today moving more slowly for environmental visual scanning as a compensation strategy for achieving safe passage. Pt endorses ability to safely navigate his home in his present state. Pt will benefit from skilled PT intervention to increase independence and safety with basic mobility in preparation for discharge to the venue listed below.     No data found.      Recommendations for follow up therapy are one component of a multi-disciplinary discharge planning process, led by the attending physician.  Recommendations may be updated based on patient status, additional functional criteria and insurance authorization.  Follow Up Recommendations Home health PT      Assistance Recommended at Discharge PRN  Patient can return home with the  following  Assist for transportation;Direct supervision/assist for medications management    Equipment Recommendations None recommended by PT  Recommendations for Other Services       Functional Status Assessment Patient has had a recent decline in their functional status and demonstrates the ability to make significant improvements in function in a reasonable and predictable amount of time.     Precautions / Restrictions Precautions Precautions: Fall Restrictions Weight Bearing Restrictions: No      Mobility  Bed Mobility Overal bed mobility: Independent                  Transfers Overall transfer level: Independent Equipment used: None   Sit to Stand: Independent                Ambulation/Gait Ambulation/Gait assistance: Modified independent (Device/Increase time) Gait Distance (Feet): 480 Feet Assistive device: None Gait Pattern/deviations: Step-through pattern       General Gait Details: mild unsteadiness at times well managed, pt is aware of field deficits and is able to use increased scanning to safely navigate obstacles.  Stairs            Wheelchair Mobility    Modified Rankin (Stroke Patients Only)       Balance Overall balance assessment: Modified Independent                                           Pertinent Vitals/Pain Pain Assessment Pain Assessment: No/denies pain    Home Living Family/patient expects to be discharged to:: Private residence Living Arrangements: Alone Available Help at Discharge: Neighbor;Family;Available PRN/intermittently Type of Home: House Home Access: Stairs  to enter Entrance Stairs-Rails: Left Entrance Stairs-Number of Steps: 6 (at back of house) Alternate Level Stairs-Number of Steps: 12 (to basement) Home Layout: Multi-level Home Equipment: Shower seat - built in;Grab bars - tub/shower;Cane - single point Additional Comments: Pt has daughter in [place], close neighbor,  and church members available PRN. Pt's brother lives in [place] and plans to move in with him at D/C.    Prior Function                       Hand Dominance        Extremity/Trunk Assessment   Upper Extremity Assessment Upper Extremity Assessment: Overall WFL for tasks assessed    Lower Extremity Assessment Lower Extremity Assessment: Overall WFL for tasks assessed       Communication      Cognition Arousal/Alertness: Awake/alert Behavior During Therapy: WFL for tasks assessed/performed Overall Cognitive Status: Within Functional Limits for tasks assessed                                          General Comments      Exercises     Assessment/Plan    PT Assessment Patient needs continued PT services  PT Problem List Decreased knowledge of precautions;Decreased mobility       PT Treatment Interventions DME instruction;Balance training;Gait training;Functional mobility training;Therapeutic activities;Neuromuscular re-education;Patient/family education    PT Goals (Current goals can be found in the Care Plan section)  Acute Rehab PT Goals Patient Stated Goal: return to home and continue to mobilize safely PT Goal Formulation: With patient Time For Goal Achievement: 11/17/21 Potential to Achieve Goals: Good    Frequency 7X/week     Co-evaluation               AM-PAC PT "6 Clicks" Mobility  Outcome Measure Help needed turning from your back to your side while in a flat bed without using bedrails?: None Help needed moving from lying on your back to sitting on the side of a flat bed without using bedrails?: None Help needed moving to and from a bed to a chair (including a wheelchair)?: None Help needed standing up from a chair using your arms (e.g., wheelchair or bedside chair)?: None Help needed to walk in hospital room?: A Little Help needed climbing 3-5 steps with a railing? : A Little 6 Click Score: 22    End of Session  Equipment Utilized During Treatment: Gait belt Activity Tolerance: Patient tolerated treatment well;No increased pain Patient left: in bed;with call bell/phone within reach;with family/visitor present Nurse Communication: Mobility status;Precautions PT Visit Diagnosis: Other abnormalities of gait and mobility (R26.89);Other symptoms and signs involving the nervous system (R29.898)    Time: 1301-1320 PT Time Calculation (min) (ACUTE ONLY): 19 min   Charges:   PT Evaluation $PT Eval Low Complexity: 1 Low          3:21 PM, 11/03/21 Etta Grandchild, PT, DPT Physical Therapist - [facility]  [phone] ([place])    [place] 11/03/2021, 3:19 PM

## 2021-11-03 NOTE — Consult Note (Signed)
NEURO HOSPITALIST CONSULT NOTE   Requestig physician: Dr. Maryland Pink  Reason for Consult: Large subacute right PCA territory stroke  History obtained from:  Patient and Chart     HPI:                                                                                                                                          Justin Harrington is a 76 y.o. male with a PMHx of carotid atherosclerotic disease s/p right CEA, CAD, aortic aneurysm, DVT s/p IVC filter, spinal fusion, HTN and prostate CA s/p prostatectomy who presented to the ED yesterday evening with a c/c of left sided vision loss that had been ongoing for the past 5 days. He was seen by Ophthalmology prior to arrival and sent to our ED to rule out possible stroke. He has no prior history of stroke. Daughter suspects that he may be having some cognitive decline with overall self-care worsening since his wife passed away in 05/01/22. The patient states that he has been partially compliant with his medications, but that he started taking his ASA and Plavix regularly again after the visual blurring started.   Past Medical History:  Diagnosis Date   Aortic aneurysm ([place])    Coronary artery disease    DVT (deep venous thrombosis) ([facility])    H/O DVT in left leg, filter placed approximatly 8 yrs ago   H/O spinal fusion 1998   Back pain   Hypertension    controlled on meds   Personal history of tobacco use, presenting hazards to health 06/12/2015   Personal history of tobacco use, presenting hazards to health 06/12/2015   Prostate cancer [facility])    Had prostatectomy   Wears dentures    upper    Past Surgical History:  Procedure Laterality Date   BACK SURGERY  1996/04/30   Lumbar spine fusion   carotid doppler ultrasound  10/05/2011   75-90% RCA occlusion, 50% on left   CAROTID ENDARTERECTOMY Right 12/02/2011   Dr. Lucky Cowboy   CHOLECYSTECTOMY  10/02/2010   Laparoscopic, Dr. Pat Patrick, [facility]   COLONOSCOPY WITH PROPOFOL N/A 06/20/2017    Procedure: COLONOSCOPY WITH PROPOFOL;  Surgeon: [name], MD;  Location: [place];  Service: Endoscopy;  Laterality: N/A;   CT SCAN  10/02/2010   [facility]; Infrarenal suprailiac abdominal aortic aneurysm maximum dimention of nearly 4cm. Cholelithiasis and acute cholecystiis. Mild enlargement of left adrenal gland   ENDOVASCULAR REPAIR/STENT GRAFT N/A 03/29/2018   Procedure: ENDOVASCULAR REPAIR/STENT GRAFT;  Surgeon: Algernon Huxley, MD;  Location: [facility];  Service: Cardiovascular;  Laterality: N/A;   LUMBAR SPINE HARDWARE REMOVAL  07/30/2016   [name], MD   PROSTATE SURGERY  10/19/2012   prostatectomy. [facility]; Laser assisted, Done by Dr.  Marella Chimes for prostate cancer    Family History  Problem Relation Age of Onset   Heart attack Mother    Congestive Heart Failure Father    Cerebral palsy Son              Social History:  reports that he has been smoking cigarettes. He has a 25.00 pack-year smoking history. He has never used smokeless tobacco. He reports that he does not drink alcohol and does not use drugs.  Allergies  Allergen Reactions   Zolpidem     Other reaction(s): Other (See Comments) GOT UP IN MIDDLE OF NIGHT AND FILLED ALL THE GLASSES WITH FLOUR    MEDICATIONS:                                                                                                                     No current facility-administered medications on file prior to encounter.   Current Outpatient Medications on File Prior to Encounter  Medication Sig Dispense Refill   aspirin EC 81 MG tablet Take 81 mg by mouth daily.     oxyCODONE (ROXICODONE) 15 MG immediate release tablet Take 1 tablet (15 mg total) by mouth 4 (four) times daily as needed for pain. 120 tablet 0   Sennosides (EX-LAX PO) Take 3-4 tablets by mouth every 3 (three) days as needed (constipation).     acetaminophen (TYLENOL) 325 MG tablet Take 1-2 tablets (325-650 mg total) by mouth every 6 (six)  hours as needed for mild pain (or temp >/= 101 F). (Patient not taking: Reported on 03/30/2021)     amLODipine (NORVASC) 2.5 MG tablet Take 1 tablet (2.5 mg total) by mouth daily. (Patient not taking: Reported on 09/04/2021) 30 tablet 1   atorvastatin (LIPITOR) 80 MG tablet TAKE 1 TABLET(80 MG) BY MOUTH DAILY. (Patient not taking: Reported on 09/04/2021) 90 tablet 4   clopidogrel (PLAVIX) 75 MG tablet Take 1 tablet (75 mg total) by mouth daily. (Patient not taking: Reported on 01/28/2021) 90 tablet 4   FLUoxetine (PROZAC) 20 MG capsule Take 1 capsule (20 mg total) by mouth daily. (Patient not taking: Reported on 11/03/2021) 30 capsule 1   hydrochlorothiazide (HYDRODIURIL) 25 MG tablet TAKE 1 TABLET(25 MG) BY MOUTH DAILY 90 tablet 4   lubiprostone (AMITIZA) 24 MCG capsule Take 1 capsule (24 mcg total) by mouth 2 (two) times daily with a meal. (Patient not taking: Reported on 01/28/2020) 60 capsule 4   naloxone (NARCAN) nasal spray 4 mg/0.1 mL Place 4 mg into the nose as directed.     polyethylene glycol (MIRALAX / GLYCOLAX) 17 g packet Take 17 g by mouth daily as needed.     traZODone (DESYREL) 150 MG tablet Take 1 tablet (150 mg total) by mouth at bedtime as needed for sleep. Pt given enough refill to last until upcoming appt (Patient not taking: Reported on 09/04/2021) 35 tablet 0    Scheduled:   stroke: early stages of recovery book   Does not apply Once  aspirin EC  81 mg Oral Daily   atorvastatin  80 mg Oral Daily   FLUoxetine  20 mg Oral Daily   Continuous:   ROS:                                                                                                                                       No CP, ear ringing, aphasia, limb weakness, facial droop, SOB, N/V or paresthesias. His only complaint is his recent vision loss.    Blood pressure (!) 178/98, pulse 82, temperature 97.7 F (36.5 C), temperature source Oral, resp. rate 18, height '6\' 1"'$  (1.854 m), weight 74.8 kg, SpO2 97  %.   General Examination:                                                                                                       Physical Exam  HEENT-  [place]/AT    Lungs- Respirations unlabored Extremities- No edema   Neurological Examination Mental Status: Awake and alert. Fully oriented. Thought content appropriate. Speech fluent without evidence of aphasia.  Able to follow all commands without difficulty. Cranial Nerves: II: Left homonymous hemianopsia. Pupils are round and 2 mm bilaterally.  Harrington,IV, VI: No ptosis. EOMI. No nystagmus. V: Temp sensation equal bilaterally VII: Smile symmetric VIII: Hearing intact to voice IX,X: No hypophonia or hoarseness XI: Symmetric XII: Midline tongue extension Motor: RUE: 5/5 RLE: 5/5 LUE: 5/5 LLE: 5/5 Sensory: Temp and FT intact x 4.  Deep Tendon Reflexes: 2+ and symmetric bilateral biceps, brachioradialis and patellae Cerebellar: No ataxia with FNF or H-S bilaterally Gait: Deferred   Lab Results: Basic Metabolic Panel: Recent Labs  Lab 11/02/21 1810  NA 136  K 3.8  CL 97*  CO2 30  GLUCOSE 111*  BUN 18  CREATININE 1.63*  CALCIUM 9.5    CBC: Recent Labs  Lab 11/02/21 1810  WBC 11.4*  NEUTROABS 8.3*  HGB 16.3  HCT 48.6  MCV 92.2  PLT 188    Cardiac Enzymes: No results for input(s): "CKTOTAL", "CKMB", "CKMBINDEX", "TROPONINI" in the last 168 hours.  Lipid Panel: Recent Labs  Lab 11/03/21 0338  CHOL 200  TRIG 80  HDL 47  CHOLHDL 4.3  VLDL 16  LDLCALC 137*    Imaging: US Carotid Bilateral (at [facility] and [facility] only)  Result Date: 11/03/2021 CLINICAL DATA:  Large acute infarct right PCA territory on MRI yesterday. Pertinent history includes a right carotid endarterectomy on 12/02/2011 EXAM: BILATERAL CAROTID DUPLEX ULTRASOUND  TECHNIQUE: Pearline Cables scale imaging, color Doppler and duplex ultrasound were performed of bilateral carotid and vertebral arteries in the neck. COMPARISON:  Carotid ultrasound 10/05/2011, CTA neck  10/21/2011. FINDINGS: Criteria: Quantification of carotid stenosis is based on velocity parameters that correlate the residual internal carotid diameter with NASCET-based stenosis levels, using the diameter of the distal internal carotid lumen as the denominator for stenosis measurement. The following velocity measurements were obtained: RIGHT ICA: 68 cm/sec CCA: 50 cm/sec SYSTOLIC ICA/CCA RATIO:  1.6 ECA: 30 cm/sec LEFT ICA: 80 cm/sec CCA: 48 cm/sec SYSTOLIC ICA/CCA RATIO:  1.5 ECA: 165 cm/sec RIGHT CAROTID ARTERY: As seen in 2013 there is moderate heterogeneous mixed plaque in the common carotid artery, but causing less than 50% stenosis. There is mild-to-moderate nonstenosing soft plaque in the cervical ICA origin and bulb. Rest of the cervical ICA is sonographically unremarkable. RIGHT VERTEBRAL ARTERY: There is antegrade flow with a peak systolic velocity of 85.2 centimeter/second LEFT CAROTID ARTERY: There is mild patchy nonstenosing mixed plaque in the common carotid artery, mild-to-moderate nonstenosing mixed plaque in the left carotid bulb and proximal cervical ICA with less than 50% stenosis. The mid to distal vessel demonstrated no significant plaques. LEFT VERTEBRAL ARTERY: There is antegrade flow with peak systolic velocity 77.8 centimeter/second. Upper extremity blood pressures: RIGHT: Not obtained. LEFT: Not obtained. IMPRESSION: Bilateral heterogeneous plaques as detailed above, but less than 50% stenosis by velocity numbers and gross sonographic appearance. Electronically Signed   By: Telford Nab M.D.   On: 11/03/2021 02:49   MR BRAIN WO CONTRAST  Result Date: 11/03/2021 CLINICAL DATA:  Blurry vision EXAM: MRI HEAD WITHOUT CONTRAST TECHNIQUE: Multiplanar, multiecho pulse sequences of the brain and surrounding structures were obtained without intravenous contrast. COMPARISON:  03/11/2020 FINDINGS: Brain: There is a large acute infarct of the right PCA territory with associated cytotoxic edema.  No acute or chronic hemorrhage. There is multifocal hyperintense T2-weighted signal within the white matter. Generalized volume loss. The midline structures are normal. Vascular: Major flow voids are preserved. Skull and upper cervical spine: Normal calvarium and skull base. Visualized upper cervical spine and soft tissues are normal. Sinuses/Orbits:No paranasal sinus fluid levels or advanced mucosal thickening. No mastoid or middle ear effusion. Normal orbits. IMPRESSION: Large acute infarct of the right PCA territory. No hemorrhage or mass effect. Electronically Signed   By: Ulyses Jarred M.D.   On: 11/03/2021 00:18   CT HEAD WO CONTRAST  Result Date: 11/02/2021 CLINICAL DATA:  Blurred vision for 5 days left greater than right EXAM: CT HEAD WITHOUT CONTRAST TECHNIQUE: Contiguous axial images were obtained from the base of the skull through the vertex without intravenous contrast. RADIATION DOSE REDUCTION: This exam was performed according to the departmental dose-optimization program which includes automated exposure control, adjustment of the mA and/or kV according to patient size and/or use of iterative reconstruction technique. COMPARISON:  12/12/2019 FINDINGS: Brain: Hypodensity within the right occipital lobe consistent with acute to subacute infarct. No evidence of acute hemorrhage. Lateral ventricles and midline structures are unremarkable. No acute extra-axial fluid collections. No mass effect. Vascular: No hyperdense vessel or unexpected calcification. Skull: Normal. Negative for fracture or focal lesion. Sinuses/Orbits: No acute finding. Other: None. IMPRESSION: 1. 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 No evidence of hemorrhagic transformation. Further evaluation with MRI could better assess the acuity. Electronically Signed   By: Randa Ngo M.D.   On: 11/02/2021 19:13     Assessment: 76 year old male presenting with a 5 day history of left-sided  visual blurring - Exam reveals left  homonymous hemianopsia. No motor weakness, ataxia or somatosensory deficits noted.  - CT head:  Acute to subacute right occipital infarct. No evidence of hemorrhagic transformation. - MRI brain: Large subacute infarct of the right PCA territory with associated cytotoxic edema. No acute or chronic hemorrhage. Also noted is multifocal hyperintense T2-weighted signal within the white matter, appearing most consistent with chronic microvascular ischemia.  - Carotid ultrasound: As seen in 2013 there is moderate heterogeneous mixed plaque in the right common carotid artery, but causing less than 50% stenosis. There is mild-to-moderate nonstenosing soft plaque in the cervical right ICA origin and bulb. Mild patchy nonstenosing mixed plaque in the left common carotid artery, mild-to-moderate nonstenosing mixed plaque in the left carotid bulb and proximal cervical ICA with less than 50% stenosis. The mid to distal vessel demonstrated no significant plaques.Vertebral arteries with antegrade flow.  - EKG: Normal sinus rhythm; Incomplete right bundle branch block - TTE: Pending - Stroke risk factors: Cancer history, carotid atherosclerotic disease, CAD, DVT and HTN.  - Most likely etiologies for the patient's stroke are cardioembolic and atherothrombotic.   Recommendations: - CTA of head and neck (ordered) - Cardiac telemetry - The patient was on DAPT at home. Would continue ASA and Plavix.  - Continue atorvastatin - Out of the permissive HTN time window. BP management per standard protocol.  - IVF - HgbA1c, fasting lipid panel - PT consult, OT consult, Speech consult - The patient has been counseled not to drive due to the severe impairment affecting his left hemifield, which would put him at significant risk for an MVA. The patient expressed understanding and agreement with this recommendation.  - Risk factor modification - Outpatient Neurology follow up.    Electronically signed: Dr. Kerney Elbe 11/03/2021, 8:53 AM

## 2021-11-03 NOTE — Hospital Course (Signed)
76 year old male with past medical history of chronic low back pain on opiates, CAD and PAD, tobacco abuse, DVT status post IVC filter and prostate cancer status post prostatectomy who presented to the emergency room on 10/2 with complaints of 5 days of bilateral blurry vision, left more so than right.  He was seen by his ophthalmologist who referred him to the emergency room for possible CVA.  In the emergency room, CT scan noted an acute to subacute right occipital infarct with MRI noting large acute infarct of the right PCA territory.  Patient brought in for further evaluation and neurology consulted.

## 2021-11-03 NOTE — Evaluation (Addendum)
Occupational Therapy Evaluation Patient Details Name: Justin Harrington MRN: [id number] DOB: [birth year] Today's Date: 11/03/2021   History of Present Illness Justin Harrington is a 76 y.o. 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 CT head showed the following: Acute to subacute right occipital infarct.   Clinical Impression   Patient seen for OT evaluation. Patient presenting with impaired vision, cognition, safety awareness, and endurance impacting performance in ADLs. Patient was independent in ADLs, IADLs, and functional mobility without an AD PTA. Patient currently functioning at Mod I for bed mobility, supervision for simulated toilet transfer, and Min guard for functional mobility without AD. Anticipate CGA-Min A for standing ADL tasks. Patient will benefit from acute OT to increase overall independence in the areas of ADLs and functional mobility in order to safely discharge home. Pt could benefit from [place] following D/C to decrease falls risk, improve balance, and maximize independence in self-care within own home environment.      Recommendations for follow up therapy are one component of a multi-disciplinary discharge planning process, led by the attending physician.  Recommendations may be updated based on patient status, additional functional criteria and  insurance authorization.   Follow Up Recommendations  Home health OT    Assistance Recommended at Discharge Frequent or constant Supervision/Assistance  Patient can return home with the following A little help with walking and/or transfers;A little help with bathing/dressing/bathroom;Help with stairs or ramp for entrance;Assist for transportation;Assistance with cooking/housework;Direct supervision/assist for medications management    Functional Status Assessment  Patient has had a recent decline in their functional status and demonstrates the ability to make significant improvements in function in a reasonable and predictable amount of time.  Equipment Recommendations  None recommended by OT    Recommendations for Other Services       Precautions / Restrictions Precautions Precautions: Fall Restrictions Weight Bearing Restrictions: No      Mobility Bed Mobility Overal bed mobility: Modified Independent             General bed mobility comments: supine <> sit    Transfers Overall transfer level: Needs assistance Equipment used: None Transfers: Sit to/from Stand Sit to Stand: Supervision           General transfer comment: STS from EOB      Balance Overall balance assessment: Needs assistance Sitting-balance support: Feet supported Sitting balance-Leahy Scale: Good Sitting balance - Comments: supervision sitting EOB   Standing balance support: No upper extremity supported Standing balance-Leahy Scale: Fair Standing balance comment: Min guard dynamic standing                           ADL either performed or assessed with clinical judgement   ADL Overall ADL's : Needs assistance/impaired  Toilet Transfer: Copy Details (indicate cue type and reason): simualted with STS from EOB         Functional mobility during ADLs: Min guard (to ambulate ~30 ft in hallway and back) General  ADL Comments: Anticipate CGA-Min A for standing ADL tasks     Vision Baseline Vision/History: 1 Wears glasses (readers only) Patient Visual Report: Blurring of vision Vision Assessment?: Yes Eye Alignment: Within Functional Limits Ocular Range of Motion: Within Functional Limits Alignment/Gaze Preference: Within Defined Limits Tracking/Visual Pursuits: Requires cues, head turns, or add eye shifts to track;Decreased smoothness of vertical tracking;Decreased smoothness of horizontal tracking Saccades: Within functional limits Depth Perception: Undershoots (undershooting noted at first with L finger to nose testing but then able to self-correct) Additional Comments: Pt reported "black floaters" in L eye, blurry vision occasionally. Confrontation testing L eye (impaired for L outer upper/lower quadrant), able to read large and smaller print on badge with L eye     Perception     Praxis      Pertinent Vitals/Pain Pain Assessment Pain Assessment: No/denies pain     Hand Dominance     Extremity/Trunk Assessment Upper Extremity Assessment Upper Extremity Assessment: Overall WFL for tasks assessed (BUE AROM WFL, good grip strength bilaterally, BUE strength grossly 4/5)   Lower Extremity Assessment Lower Extremity Assessment: Defer to PT evaluation   Cervical / Trunk Assessment Cervical / Trunk Assessment: Normal   Communication Communication Communication: No difficulties   Cognition Arousal/Alertness: Awake/alert Behavior During Therapy: WFL for tasks assessed/performed Overall Cognitive Status: No family/caregiver present to determine baseline cognitive functioning                                 General Comments: RN reporting some possible memory difficulties, has not been taking medications. A&Ox4 (grossly oriented to time). Spoke with pt's daughter who reported some STM difficulty for the past year. She is considering helping the pt transition to ALF vs move in  with pt's brother 2/2 needing more support.     General Comments       Exercises Other Exercises Other Exercises: OT provided education re: role of OT, OT POC, post acute recs, sitting up for all meals, EOB/OOB mobility with assistance, home/fall safety, visual compensatory techniques    Shoulder Instructions      Home Living Family/patient expects to be discharged to:: Private residence Living Arrangements: Alone Available Help at Discharge: Neighbor;Family;Available PRN/intermittently Type of Home: House Home Access: Stairs to enter [employer] of Steps: 6 (at back of house) Entrance Stairs-Rails: Left Home Layout: Multi-level (Pt stays in basement) Alternate Level Stairs-Number of Steps: 12 (to basement) Alternate Level Stairs-Rails: Left;Right Bathroom Shower/Tub: Occupational psychologist: Standard     Home Equipment: Shower seat - built in;Grab bars - tub/shower;Cane - single point   Additional Comments: Pt has daughter in [place], close neighbor, and church members available PRN. Pt's brother lives in [place] and plans to move in with him at D/C.      Prior Functioning/Environment Prior Level of Function : Independent/Modified Independent;Driving             Mobility Comments: IND functional mobility no AD. No falls ADLs Comments: IND with ADLs, IADLs. Was driving and doing yard work.        OT Problem List: Impaired vision/perception;Impaired balance (sitting and/or standing);Decreased knowledge of use of DME or AE;Decreased coordination;Decreased activity tolerance;Decreased safety awareness;Decreased cognition  OT Treatment/Interventions: Self-care/ADL training;Therapeutic exercise;Therapeutic activities;Cognitive remediation/compensation;Energy conservation;Visual/perceptual remediation/compensation;Patient/family education;Balance training;DME and/or AE instruction    OT Goals(Current goals can be found in the care plan section)  Acute Rehab OT Goals Patient Stated Goal: go home OT Goal Formulation: With patient Time For Goal Achievement: 11/17/21 Potential to Achieve Goals: Good ADL Goals Pt Will Perform Lower Body Dressing: with modified independence;sitting/lateral leans;sit to/from stand Pt Will Transfer to Toilet: with modified independence;ambulating;regular height toilet Pt Will Perform Toileting - Clothing Manipulation and hygiene: with modified independence;sit to/from stand Additional ADL Goal #1: Pt will provide teach back for 2-3 visual compensatory techniques (environmental scanning, lighthouse technique) with Min VC  OT Frequency: Min 2X/week    Co-evaluation              AM-PAC OT "6 Clicks" Daily Activity     Outcome Measure Help from another person eating meals?: None Help from another person taking care of personal grooming?: A Little Help from another person toileting, which includes using toliet, bedpan, or urinal?: A Lot Help from another person bathing (including washing, rinsing, drying)?: A Lot Help from another person to put on and taking off regular upper body clothing?: None Help from another person to put on and taking off regular lower body clothing?: A Little 6 Click Score: 18   End of Session Equipment Utilized During Treatment: Gait belt Nurse Communication: Mobility status  Activity Tolerance: Patient tolerated treatment well Patient left: in bed;with call bell/phone within reach;with bed alarm set  OT Visit Diagnosis: Other abnormalities of gait and mobility (R26.89);Other symptoms and signs involving cognitive function                Time: 1007-1031 OT Time Calculation (min): 24 min Charges:  OT General Charges $OT Visit: 1 Visit OT Evaluation $OT Eval Moderate Complexity: 1 Mod  [facility] MS, OTR/L ascom [phone]  11/03/21, 11:15 AM

## 2021-11-03 NOTE — ED Notes (Signed)
Spoke with pt's daughter Stanton Kidney on the phone [phone], made her aware of care plan for pt

## 2021-11-03 NOTE — ED Notes (Signed)
Ultrasound at bedside

## 2021-11-03 NOTE — Discharge Summary (Incomplete)
Physician Discharge Summary   Patient: Justin Harrington MRN: [id number] DOB: [birth year]  Admit date:     11/02/2021  Discharge date: {dischdate:26783}  Discharge Physician: Annita Brod   PCP: [name], MD   Recommendations at discharge:  {Tip this will not be part of the note when signed- Example include specific recommendations for outpatient follow-up, pending tests to follow-up on. 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 *  Hospital Course: 76 year old male with past medical history of chronic low back pain on opiates, CAD and PAD, tobacco abuse, DVT status post IVC filter and prostate cancer status post prostatectomy who presented to the emergency room on 10/2 with complaints of 5 days of bilateral blurry vision, left more so than right.  He was seen by his ophthalmologist who referred him to the emergency room for possible CVA.  In the emergency room, CT scan noted an acute to subacute right occipital infarct with MRI noting large acute infarct of the right PCA territory.  Patient brought in for further evaluation and neurology consulted.  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  Add ASA 2m daily. Continue home  lipitor 849mContinuous cardiac monitoring, carotid Doppler and echocardiogram Follow-up MRI...."Marland Kitchenarge acute infarct of the right PCA territory. No hemorrhage or mass effect" Avoid dextrose containing fluids, Maintain euglycemia, euthermia Neuro checks q4 hrs x 24 hrs and then per shift Head of bed 30 degrees Physical therapy/Occupational therapy/Speech therapy if failed dysphagia screen Neurology consult to follow   Bilateral carotid artery stenosis with hx of right-sided carotid endarterectomy We will get carotid Doppler.  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  Followed up with neurology and had an unremarkable MRI May 2022.      {Tip this will not be part of the note when signed Body mass index is 21.77 kg/m. , ,  (Optional):26781}  {(NOTE) Pain control PDMP Statment (Optional):26782} Consultants: *** Procedures performed: ***  Disposition: {Plan; Disposition:26390} Diet recommendation:  Discharge Diet Orders (From admission, onward)     Start     Ordered   11/03/21 0000  Diet - low sodium heart healthy        11/03/21 1708           {Diet_Plan:26776} DISCHARGE MEDICATION: Allergies as of 11/03/2021       Reactions   Zolpidem    Other reaction(s): Other (See  Comments) GOT UP IN MIDDLE OF NIGHT AND FILLED ALL THE GLASSES WITH FLOUR        Medication List     STOP taking these medications    lubiprostone 24 MCG capsule Commonly known as: Amitiza   traZODone 150 MG tablet Commonly known as: DESYREL       TAKE these medications    amLODipine 2.5 MG  tablet Commonly known as: NORVASC Take 1 tablet (2.5 mg total) by mouth daily.   aspirin EC 81 MG tablet Take 81 mg by mouth daily.   atorvastatin 80 MG tablet Commonly known as: LIPITOR Take 1 tablet (80 mg total) by mouth daily. What changed: See the new instructions.   clopidogrel 75 MG tablet Commonly known as: PLAVIX Take 1 tablet (75 mg total) by mouth daily.   EX-LAX PO Take 3-4 tablets by mouth every 3 (three) days as needed (constipation).   FLUoxetine 20 MG capsule Commonly known as: PROZAC Take 1 capsule (20 mg total) by mouth daily.   hydrochlorothiazide 25 MG tablet Commonly known as: HYDRODIURIL TAKE 1 TABLET(25 MG) BY MOUTH DAILY   naloxone 4 MG/0.1ML Liqd nasal spray kit Commonly known as: NARCAN Place 4 mg into the nose as directed.   oxyCODONE 15 MG immediate release tablet Commonly known as: ROXICODONE Take 1 tablet (15 mg total) by mouth 4 (four) times daily as needed for pain.   polyethylene glycol 17 g packet Commonly known as: MIRALAX / GLYCOLAX Take 17 g by mouth daily as needed.        Follow-up Information     [name], MD Follow up in 1 month(s).   Specialty: Family Medicine Contact information: [address] [place] [address] [phone]                Discharge Exam: Danley Danker Weights   11/02/21 1753  Weight: 74.8 kg   ***  Condition at discharge: {DC Condition:26389}  The results of significant diagnostics from this hospitalization (including imaging, microbiology, ancillary and laboratory) are listed below for reference.   Imaging Studies: CT ANGIO HEAD NECK W WO CM  Result Date: 11/03/2021 CLINICAL DATA:  Stroke follow-up. Left-sided vision loss. Acute right PCA infarct on MRI. History of right carotid endarterectomy. EXAM: CT ANGIOGRAPHY HEAD AND NECK TECHNIQUE: Multidetector CT imaging of the head and neck was performed using the standard protocol during bolus administration of  intravenous contrast. Multiplanar CT image reconstructions and MIPs were obtained to evaluate the vascular anatomy. Carotid stenosis measurements (when applicable) are obtained utilizing NASCET criteria, using the distal internal carotid diameter as the denominator. RADIATION DOSE REDUCTION: This exam was performed according to the departmental dose-optimization program which includes automated exposure control, adjustment of the mA and/or kV according to patient size and/or use of iterative reconstruction technique. CONTRAST:  153m OMNIPAQUE IOHEXOL 350 MG/ML SOLN COMPARISON:  Carotid Doppler ultrasound 11/03/2021. Head MRI 11/02/2021. Neck CTA 10/21/2011. FINDINGS: CT HEAD FINDINGS Brain: A large acute to subacute right PCA infarct is again noted with unchanged cytotoxic edema. No malignant hemorrhagic transformation, midline shift, new infarct, or extra-axial fluid collection is identified. There is a background of mild to moderate chronic small vessel ischemic disease in the cerebral white matter and mild cerebral atrophy. Vascular: Calcified atherosclerosis at the skull base. Skull: No fracture or suspicious osseous lesion. Sinuses/Orbits: Paranasal sinuses and mastoid air cells are clear. Left cataract extraction. Other: None. Review of the MIP images confirms the above findings CTA NECK FINDINGS Aortic  arch: The aortic arch and proximal aspects of the arch vessels were not imaged. Right carotid system: Prior endarterectomy. Moderate amount of mixed calcified and soft plaque scattered throughout the common carotid artery and predominantly soft plaque at the carotid bifurcation resulting in luminal irregularity but no significant common carotid or internal carotid stenosis. Severe stenosis of the ECA origin. Left carotid system: Patent, although the proximal common carotid artery was not imaged. Moderate amount of predominantly calcified plaque at the carotid bifurcation. Luminal irregularity without  significant stenosis. Vertebral arteries: Patent and codominant with a small amount of calcified plaque at the right vertebral origin. No evidence of a significant stenosis or dissection. Skeleton: Moderate cervical spondylosis. Other neck: No evidence of cervical lymphadenopathy or mass. Upper chest: Moderate emphysema. Review of the MIP images confirms the above findings CTA HEAD FINDINGS Anterior circulation: The internal carotid arteries are patent from skull base to carotid termini with mild-to-moderate calcified plaque bilaterally not resulting in significant stenosis. ACAs and MCAs are patent without evidence of a proximal branch occlusion or significant A1 or M1 stenosis. There is mild branch vessel irregularity. No aneurysm is identified. Posterior circulation: The intracranial vertebral arteries are patent to the basilar with atherosclerosis resulting in up to mild stenosis bilaterally. Patent PICA and SCA origins are seen bilaterally with suggestion of a severe stenosis of the left SCA origin. The basilar artery is widely patent. The right PCA is occluded near the P1-P2 junction. The left PCA is patent, and there is a moderate left P2 stenosis. There is a patent, moderate-sized left posterior communicating artery. A right posterior communicating artery is also present, although there is only faint opacification of a distally. No aneurysm is identified. Venous sinuses: As permitted by contrast timing, patent. Anatomic variants: None. Review of the MIP images confirms the above findings IMPRESSION: 1. 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 Associated large acute to subacute right PCA infarct without hemorrhagic transformation. 2. Intracranial atherosclerosis including mild bilateral V4 and moderate left P2 stenoses. 3. Cervical carotid atherosclerosis without significant common carotid or internal carotid artery stenosis. Prior right carotid endarterectomy. Severe right ECA origin stenosis.  Electronically Signed   By: Logan Bores M.D.   On: 11/03/2021 16:02   ECHOCARDIOGRAM COMPLETE  Result Date: 11/03/2021    ECHOCARDIOGRAM REPORT   Patient Name:   Clemmie Marxen Harrington Date of Exam: 11/03/2021 Medical Rec #:  [id number]          Height:       73.0 in Accession #:    [id number]         Weight:       165.0 lb Date of Birth:  12/22/1945           BSA:          1.983 m Patient Age:    76 years           BP:           178/98 mmHg Patient Gender: M                  HR:           81 bpm. Exam Location:  [facility] Procedure: 2D Echo, Cardiac Doppler and Color Doppler Indications:     I63.9 Stroke  History:         Patient has no prior history of Echocardiogram examinations.                  CAD, AAA stent,  right endarterectomy, IVC filter; Risk                  Factors:Current Smoker, Dyslipidemia and Hypertension.  Sonographer:     Rosalia Hammers Referring Phys:  [id number] Athena Masse Diagnosing Phys: [place]  1. Left ventricular ejection fraction, by estimation, is 50 to 55%. The left ventricle has low normal function. The left ventricle demonstrates global hypokinesis. There is moderate concentric left ventricular hypertrophy. Left ventricular diastolic parameters are consistent with Grade I diastolic dysfunction (impaired relaxation).  2. Right ventricular systolic function is normal. The right ventricular size is mildly enlarged.  3. Left atrial size was mildly dilated.  4. Right atrial size was mildly dilated.  5. The mitral valve is grossly normal. Trivial mitral valve regurgitation.  6. The aortic valve is calcified. Aortic valve regurgitation is moderate. Aortic valve sclerosis/calcification is present, without any evidence of aortic stenosis. FINDINGS  Left Ventricle: Left ventricular ejection fraction, by estimation, is 50 to 55%. The left ventricle has low normal function. The left ventricle demonstrates global hypokinesis. The left ventricular internal cavity size was normal in size.  There is moderate concentric left ventricular hypertrophy. Left ventricular diastolic parameters are consistent with Grade I diastolic dysfunction (impaired relaxation). Right Ventricle: The right ventricular size is mildly enlarged. No increase in right ventricular wall thickness. Right ventricular systolic function is normal. Left Atrium: Left atrial size was mildly dilated. Right Atrium: Right atrial size was mildly dilated. Pericardium: There is no evidence of pericardial effusion. Mitral Valve: The mitral valve is grossly normal. Trivial mitral valve regurgitation. Tricuspid Valve: The tricuspid valve is grossly normal. Tricuspid valve regurgitation is trivial. Aortic Valve: The aortic valve is calcified. Aortic valve regurgitation is moderate. Aortic regurgitation PHT measures 538 msec. Aortic valve sclerosis/calcification is present, without any evidence of aortic stenosis. Aortic valve mean gradient measures  2.0 mmHg. Aortic valve peak gradient measures 4.2 mmHg. Aortic valve area, by VTI measures 3.01 cm. Pulmonic Valve: The pulmonic valve was grossly normal. Pulmonic valve regurgitation is trivial. Aorta: The aortic root, ascending aorta and aortic arch are all structurally normal, with no evidence of dilitation or obstruction. IAS/Shunts: No atrial level shunt detected by color flow Doppler.  LEFT VENTRICLE PLAX 2D LVIDd:         4.80 cm   Diastology LVIDs:         3.60 cm   LV e' medial:    4.90 cm/s LV PW:         1.10 cm   LV E/e' medial:  11.6 LV IVS:        1.00 cm   LV e' lateral:   4.41 cm/s LVOT diam:     2.20 cm   LV E/e' lateral: 12.8 LV SV:         60 LV SV Index:   30 LVOT Area:     3.80 cm  RIGHT VENTRICLE RV Basal diam:  3.40 cm RV Mid diam:    2.60 cm RV S prime:     18.30 cm/s TAPSE (M-mode): 2.4 cm LEFT ATRIUM             Index        RIGHT ATRIUM           Index LA diam:        3.30 cm 1.66 cm/m   RA Area:     14.20 cm LA Vol (A2C):   49.4 ml 24.92 ml/m  RA Volume:  33.00 ml   16.64 ml/m LA Vol (A4C):   38.1 ml 19.22 ml/m LA Biplane Vol: 44.7 ml 22.54 ml/m  AORTIC VALVE AV Area (Vmax):    3.03 cm AV Area (Vmean):   2.94 cm AV Area (VTI):     3.01 cm AV Vmax:           102.00 cm/s AV Vmean:          69.000 cm/s AV VTI:            0.198 m AV Peak Grad:      4.2 mmHg AV Mean Grad:      2.0 mmHg LVOT Vmax:         81.30 cm/s LVOT Vmean:        53.400 cm/s LVOT VTI:          0.157 m LVOT/AV VTI ratio: 0.79 AI PHT:            538 msec  AORTA Ao Root diam: 3.90 cm MITRAL VALVE                TRICUSPID VALVE MV Area (PHT): 3.77 cm     TR Peak grad:   26.6 mmHg MV Decel Time: 201 msec     TR Vmax:        258.00 cm/s MV E velocity: 56.60 cm/s MV A velocity: 105.00 cm/s  SHUNTS MV E/A ratio:  0.54         Systemic VTI:  0.16 m                             Systemic Diam: 2.20 cm Neoma Laming Electronically signed by Neoma Laming Signature Date/Time: 11/03/2021/10:38:48 AM    Final    US Carotid Bilateral (at [facility] and [facility] only)  Result Date: 11/03/2021 CLINICAL DATA:  Large acute infarct right PCA territory on MRI yesterday. Pertinent history includes a right carotid endarterectomy on 12/02/2011 EXAM: BILATERAL CAROTID DUPLEX ULTRASOUND TECHNIQUE: Pearline Cables scale imaging, color Doppler and duplex ultrasound were performed of bilateral carotid and vertebral arteries in the neck. COMPARISON:  Carotid ultrasound 10/05/2011, CTA neck 10/21/2011. FINDINGS: Criteria: Quantification of carotid stenosis is based on velocity parameters that correlate the residual internal carotid diameter with NASCET-based stenosis levels, using the diameter of the distal internal carotid lumen as the denominator for stenosis measurement. The following velocity measurements were obtained: RIGHT ICA: 68 cm/sec CCA: 50 cm/sec SYSTOLIC ICA/CCA RATIO:  1.6 ECA: 30 cm/sec LEFT ICA: 80 cm/sec CCA: 48 cm/sec SYSTOLIC ICA/CCA RATIO:  1.5 ECA: 165 cm/sec RIGHT CAROTID ARTERY: As seen in 2013 there is moderate heterogeneous mixed  plaque in the common carotid artery, but causing less than 50% stenosis. There is mild-to-moderate nonstenosing soft plaque in the cervical ICA origin and bulb. Rest of the cervical ICA is sonographically unremarkable. RIGHT VERTEBRAL ARTERY: There is antegrade flow with a peak systolic velocity of 26.3 centimeter/second LEFT CAROTID ARTERY: There is mild patchy nonstenosing mixed plaque in the common carotid artery, mild-to-moderate nonstenosing mixed plaque in the left carotid bulb and proximal cervical ICA with less than 50% stenosis. The mid to distal vessel demonstrated no significant plaques. LEFT VERTEBRAL ARTERY: There is antegrade flow with peak systolic velocity 33.5 centimeter/second. Upper extremity blood pressures: RIGHT: Not obtained. LEFT: Not obtained. IMPRESSION: Bilateral heterogeneous plaques as detailed above, but less than 50% stenosis by velocity numbers and gross sonographic appearance. Electronically Signed   By: Telford Nab  M.D.   On: 11/03/2021 02:49   MR BRAIN WO CONTRAST  Result Date: 11/03/2021 CLINICAL DATA:  Blurry vision EXAM: MRI HEAD WITHOUT CONTRAST TECHNIQUE: Multiplanar, multiecho pulse sequences of the brain and surrounding structures were obtained without intravenous contrast. COMPARISON:  03/11/2020 FINDINGS: Brain: There is a large acute infarct of the right PCA territory with associated cytotoxic edema. No acute or chronic hemorrhage. There is multifocal hyperintense T2-weighted signal within the white matter. Generalized volume loss. The midline structures are normal. Vascular: Major flow voids are preserved. Skull and upper cervical spine: Normal calvarium and skull base. Visualized upper cervical spine and soft tissues are normal. Sinuses/Orbits:No paranasal sinus fluid levels or advanced mucosal thickening. No mastoid or middle ear effusion. Normal orbits. IMPRESSION: Large acute infarct of the right PCA territory. No hemorrhage or mass effect. Electronically  Signed   By: Ulyses Jarred M.D.   On: 11/03/2021 00:18   CT HEAD WO CONTRAST  Result Date: 11/02/2021 CLINICAL DATA:  Blurred vision for 5 days left greater than right EXAM: CT HEAD WITHOUT CONTRAST TECHNIQUE: Contiguous axial images were obtained from the base of the skull through the vertex without intravenous contrast. RADIATION DOSE REDUCTION: This exam was performed according to the departmental dose-optimization program which includes automated exposure control, adjustment of the mA and/or kV according to patient size and/or use of iterative reconstruction technique. COMPARISON:  12/12/2019 FINDINGS: Brain: Hypodensity within the right occipital lobe consistent with acute to subacute infarct. No evidence of acute hemorrhage. Lateral ventricles and midline structures are unremarkable. No acute extra-axial fluid collections. No mass effect. Vascular: No hyperdense vessel or unexpected calcification. Skull: Normal. Negative for fracture or focal lesion. Sinuses/Orbits: No acute finding. Other: None. IMPRESSION: 1. 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 No evidence of hemorrhagic transformation. Further evaluation with MRI could better assess the acuity. Electronically Signed   By: Randa Ngo M.D.   On: 11/02/2021 19:13    Microbiology: Results for orders placed or performed during the hospital encounter of 12/12/19  Respiratory Panel by RT PCR (Flu A&B, Covid) - Nasopharyngeal Swab     Status: None   Collection Time: 12/12/19  8:18 PM   Specimen: Nasopharyngeal Swab  Result Value Ref Range Status   SARS Coronavirus 2 by RT PCR NEGATIVE NEGATIVE Final    Comment: (NOTE) SARS-CoV-2 target nucleic acids are NOT DETECTED.  The SARS-CoV-2 RNA is generally detectable in upper respiratoy specimens during the acute phase of infection. The lowest concentration of SARS-CoV-2 viral copies this assay can detect is 131 copies/mL. A negative result does not preclude SARS-Cov-2 infection and should  not be used as the sole basis for treatment or other patient management decisions. A negative result may occur with  improper specimen collection/handling, submission of specimen other than nasopharyngeal swab, presence of viral mutation(s) within the areas targeted by this assay, and inadequate number of viral copies (<131 copies/mL). A negative result must be combined with clinical observations, patient history, and epidemiological information. The expected result is Negative.  Fact Sheet for Patients:  PinkCheek.be  Fact Sheet for Healthcare Providers:  GravelBags.it  This test is no t yet approved or cleared by the [place] FDA and  has been authorized for detection and/or diagnosis of SARS-CoV-2 by FDA under an Emergency Use Authorization (EUA). This EUA will remain  in effect (meaning this test can be used) for the duration of the COVID-19 declaration under Section 564(b)(1) of the Act, 21 U.S.C. section 360bbb-3(b)(1), unless the authorization is terminated  or revoked sooner.     Influenza A by PCR NEGATIVE NEGATIVE Final   Influenza B by PCR NEGATIVE NEGATIVE Final    Comment: (NOTE) The Xpert Xpress SARS-CoV-2/FLU/RSV assay is intended as an aid in  the diagnosis of influenza from Nasopharyngeal swab specimens and  should not be used as a sole basis for treatment. Nasal washings and  aspirates are unacceptable for Xpert Xpress SARS-CoV-2/FLU/RSV  testing.  Fact Sheet for Patients: PinkCheek.be  Fact Sheet for Healthcare Providers: GravelBags.it  This test is not yet approved or cleared by the [place] FDA and  has been authorized for detection and/or diagnosis of SARS-CoV-2 by  FDA under an Emergency Use Authorization (EUA). This EUA will remain  in effect (meaning this test can be used) for the duration of the  Covid-19 declaration under  Section 564(b)(1) of the Act, 21  U.S.C. section 360bbb-3(b)(1), unless the authorization is  terminated or revoked. Performed at [facility], [address] [place], [place] [address]     Labs: CBC: Recent Labs  Lab 11/02/21 1810  WBC 11.4*  NEUTROABS 8.3*  HGB 16.3  HCT 48.6  MCV 92.2  PLT 658   Basic Metabolic Panel: Recent Labs  Lab 11/02/21 1810  NA 136  K 3.8  CL 97*  CO2 30  GLUCOSE 111*  BUN 18  CREATININE 1.63*  CALCIUM 9.5   Liver Function Tests: Recent Labs  Lab 11/02/21 1810  AST 20  ALT 11  ALKPHOS 60  BILITOT 1.1  PROT 8.2*  ALBUMIN 4.3   CBG: No results for input(s): "GLUCAP" in the last 168 hours.  Discharge time spent: {LESS THAN/GREATER KIYJ:49494} 30 minutes.  Signed: Annita Brod, MD [facility] 11/03/2021

## 2021-11-03 NOTE — Evaluation (Signed)
Speech Language Pathology Evaluation Patient Details Name: Justin Harrington MRN: [id number] DOB: [birth year] Today's Date: 11/03/2021 Time: 1415-1510 SLP Time Calculation (min) (ACUTE ONLY): 55 min  Problem List:  Patient Active Problem List   Diagnosis Date Noted   Chronic diastolic CHF (congestive heart failure) ([place]) 11/03/2021   Acute to subacute right occipital infarct 11/02/2021   Bilateral carotid artery stenosis with hx of right-sided carotid endarterectomy 11/02/2021   History of DVT s/p IVC filter 11/02/2021   Stage 3a chronic kidney disease ([place]) 11/02/2021   AAA s/p aortic stent 03/30/2021   Unspecified inflammatory spondylopathy, lumbar region ([place]) 12/21/2019   History of recurrent syncopal spells 12/12/2019   Atherosclerotic ulcer of aorta ([place]) 08/16/2018   Positive colorectal cancer screening using Cologuard test    Coronary artery disease 07/02/2016   Neuropathic pain 03/30/2016   Urinary hesitancy 01/01/2016   Prostate cancer with H/O prostatectomy 01/01/2016   Hypokalemia 12/10/2015   Hypomagnesemia 12/10/2015   Chronic sacroiliac joint pain (Bilateral) (R>L) 12/10/2015   Chronic hip pain (Bilateral) (R>L) 12/10/2015   Chronic pain syndrome 12/10/2015   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 10/16/2015   Opiate use (90 MME/Day) 10/16/2015   Chronic low back pain (Location of Primary Source of Pain) (Bilateral) (R>L) 10/16/2015   Failed back surgical syndrome 10/16/2015   History of lumbar fusion (L4-S1 posterior hardware) 10/16/2015   Lumbar spondylosis 10/16/2015   Lumbar facet syndrome (Bilateral) (R>L) 10/16/2015   Opioid-induced constipation (OIC) 06/05/2015   Cataract of left eye 09/03/2014   History of DVT (deep vein thrombosis) 09/03/2014   History of prostate cancer 09/03/2014   Hyperlipidemia 07/24/2014   Hypertension 07/24/2014   Carotid arterial disease ([place]) 09/02/2010   Diverticulosis of colon without hemorrhage  07/11/2006   Insomnia 06/29/2006   Smoking greater than 30 pack years 02/01/2006   Arthrodesis status 02/02/1996   Past Medical History:  Past Medical History:  Diagnosis Date   Aortic aneurysm ([place])    Coronary artery disease    DVT (deep venous thrombosis) (HCC)    H/O DVT in left leg, filter placed approximatly 8 yrs ago   H/O spinal fusion 1998   Back pain   Hypertension    controlled on meds   Personal history of tobacco use, presenting hazards to health 06/12/2015   Personal history of tobacco use, presenting hazards to health 06/12/2015   Prostate cancer [facility])    Had prostatectomy   Wears dentures    upper   Past Surgical History:  Past Surgical History:  Procedure Laterality Date   BACK SURGERY  1998   Lumbar spine fusion   carotid doppler ultrasound  10/05/2011   75-90% RCA occlusion, 50% on left   CAROTID ENDARTERECTOMY Right 12/02/2011   Dr. Lucky Cowboy   CHOLECYSTECTOMY  10/02/2010   Laparoscopic, Dr. Pat Patrick, [place] PROPOFOL N/A 06/20/2017   Procedure: COLONOSCOPY WITH PROPOFOL;  Surgeon: [name], MD;  Location: [place];  Service: Endoscopy;  Laterality: N/A;   CT SCAN  10/02/2010   [facility]; Infrarenal suprailiac abdominal aortic aneurysm maximum dimention of nearly 4cm. Cholelithiasis and acute cholecystiis. Mild enlargement of left adrenal gland   ENDOVASCULAR REPAIR/STENT GRAFT N/A 03/29/2018   Procedure: ENDOVASCULAR REPAIR/STENT GRAFT;  Surgeon: Algernon Huxley, MD;  Location: [place] CV LAB;  Service: Cardiovascular;  Laterality: N/A;   LUMBAR SPINE HARDWARE REMOVAL  07/30/2016   [name], MD   PROSTATE SURGERY  10/19/2012   prostatectomy. [employer]  of ; Laser assisted, Done by [name] for prostate cancer   HPI:  Pt  is a 11yoM who comes to [facility] on 10/2 after being seen by eye doctor for several days of blurred vision. Eye doctor expressed concerns for CVA. Workup revealing of Acute to subacute right occipital  infarct on head CT, MRI showing 'Large acute infarct of the right PCA territory.  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 Neurology reports finding of left homonymous hemianopsia.   MRI: Large acute infarct of the right PCA territory. No hemorrhage or  mass effect.   Assessment / Plan / Recommendation Clinical Impression   Pt seen today for informal cognitive-linguistic assessment at bedside. Pt resting in bed; verbal and he engaged easily w/ this SLP. Pt shared personal information of his Wife's passing and how he was getting along since then. He endorsed "mourning still" but that he had a "plan" to sell the house and move to family in [place] who he could connect w/ again. NSG did not report any speech-communication deficits; no swallowing issues. Pt denied any swallowing or communication issues today.  Pt awake, verbal and A/O x4. On RA. Pt does present w/ Left Vision deficits.   Pt appears to present w/ adequate Cognitive-linguistic skills and abilities; he stated he feels he is at his "baseline" which includes some Memory/Recall of new information but "that is why I write it down, like my appts, and clip it to the range in the Kitchen" -- "so I will see it and remember it". Delayed recall of 5-word task revealed need for a semantic cue to increase accuracy. Pt exhibited fair performance w/ Fluency task; significant strengths in standard Calculation and Abstract tasks as well as Orientation. Pt's Attention during tasks was appropriate. He was able to ID Problem situations in pictured tasks and a solution to each one appropriately. No deficits were noted in general receptive and expressive language tasks; no overt asphasia noted. No Motor Speech deficits noted. No dysarthria nor dysfluency at the conversation  level noted. Pt engaged in casual conversation w/ no pragmatic deficits. Speech intelligibility was 100% during conversation. OM exam was [facility] w/ no unilateral lingual-labial weakness noted.    Discussed results of this informal cognitive-linguistic assessment and the recommendations of No immediate f/u recommended at Discharge w/ pt and MD/NSG. Encourage pt and Family to monitor pt's status in his home environment and if any deficits in communication abilities are noted, he can f/u w/ PCP for referral to outpatient services. Pt agreed but felt his communication skills are [facility]. Encouraged rest and nutrition/hydration now, and upon returning home. NSG to reconsult if any decline in status while admitted. NSG/MD updated.    SLP Assessment  SLP Recommendation/Assessment: Patient does not need any further Speech Lanaguage Pathology Services (None indicated currently; recommend monitoring of status post returning home and f/u w/ Neurology) SLP Visit Diagnosis: Cognitive communication deficit (R41.841)    Recommendations for follow up therapy are one component of a multi-disciplinary discharge planning process, led by the attending physician.  Recommendations may be updated based on patient status, additional functional criteria and insurance authorization.    Follow Up Recommendations  Follow physician's recommendations for discharge plan and follow up therapies (no immediate needs indicated)    Assistance Recommended at Discharge  PRN (d/t recent hospitalization)  Functional Status  Assessment Patient has not had a recent decline in their functional status  Frequency and Duration  (n/a)   (n/a)      SLP Evaluation Cognition  Overall Cognitive Status: Within Functional Limits for tasks assessed (No Family present) Arousal/Alertness: Awake/alert Orientation Level: Oriented X4 Year: 2023 Month: October Day of Week: Correct Attention: Focused;Sustained Focused Attention: Appears intact Sustained  Attention: Appears intact Memory: Appears intact (for events of the day; but declined for delayed recall -- accuracy improved w/ semantic cue) Awareness: Appears intact Problem Solving: Appears intact (general problem situations around the house) Executive Function: Reasoning;Sequencing;Decision Making Reasoning: Appears intact Sequencing: Appears intact Decision Making: Appears intact Behaviors:  (n/a) Safety/Judgment: Appears intact Comments: general safety situations       Comprehension  Auditory Comprehension Overall Auditory Comprehension: Appears within functional limits for tasks assessed Yes/No Questions: Within Functional Limits Commands: Within Functional Limits (1-2 steps) Conversation: Complex Other Conversation Comments: general conversation quiate adequate Interfering Components:  (n/a) EffectiveTechniques:  (n/a) Visual Recognition/Discrimination Discrimination: Not tested Reading Comprehension Reading Status: Within funtional limits    Expression Expression Primary Mode of Expression: Verbal Verbal Expression Overall Verbal Expression: Appears within functional limits for tasks assessed Initiation: No impairment Automatic Speech:  (WFL) Level of Generative/Spontaneous Verbalization: Conversation Repetition: No impairment Naming: No impairment Pragmatics: No impairment Interfering Components:  (n/a) Effective Techniques:  (n/a) Non-Verbal Means of Communication: Not applicable Written Expression Dominant Hand: Right Written Expression: Not tested   Oral / Motor  Oral Motor/Sensory Function Overall Oral Motor/Sensory Function: Within functional limits Motor Speech Overall Motor Speech: Appears within functional limits for tasks assessed Respiration: Within functional limits Phonation: Normal Resonance: Within functional limits Articulation: Within functional limitis Intelligibility: Intelligible Motor Planning: Witnin functional limits                Orinda Kenner, MS, CCC-SLP Speech Language Pathologist Rehab Services; [place] [phone] (ascom) Tharon Bomar 11/03/2021, 3:51 PM

## 2021-11-04 NOTE — Assessment & Plan Note (Signed)
LDL at 137.  Patient had not been taking his statin.  Resumed.  Target goal below 70.

## 2021-11-04 NOTE — Assessment & Plan Note (Signed)
Stable, euvolemic.

## 2021-11-04 NOTE — Assessment & Plan Note (Signed)
As above.  Patient had stopped taking his Norvasc.  Medication resumed.  New prescription given

## 2021-11-05 ENCOUNTER — Telehealth: Payer: Self-pay

## 2021-11-05 DIAGNOSIS — I6930 Unspecified sequelae of cerebral infarction: Secondary | ICD-10-CM

## 2021-11-05 NOTE — Telephone Encounter (Signed)
Have place order to get referral initiated, but he needs to schedule visit within the next 5 days. Can be video visit, but NOT a telephone visit (medicare requirement)

## 2021-11-05 NOTE — Telephone Encounter (Signed)
Copied from [place] [id number]. Topic: Referral - Request for Referral >> Nov 05, 2021  8:37 AM Chapman Fitch wrote: Has patient seen PCP for this complaint? No  *If NO, is insurance requiring patient see PCP for this issue before PCP can refer them? Referral for which specialty: Home health and social worker  Preferred provider/office:  Reason for referral: Pt had a stroke on Monday and loss vision / needs asap

## 2021-11-05 NOTE — Addendum Note (Signed)
Addended by: [name] E on: 11/05/2021 12:02 PM   Modules accepted: Orders

## 2021-11-09 ENCOUNTER — Ambulatory Visit (INDEPENDENT_AMBULATORY_CARE_PROVIDER_SITE_OTHER): Payer: Medicare HMO | Admitting: Family Medicine

## 2021-11-09 VITALS — BP 128/75 | HR 63 | Temp 98.2°F | Wt 161.0 lb

## 2021-11-09 DIAGNOSIS — H539 Unspecified visual disturbance: Secondary | ICD-10-CM | POA: Diagnosis not present

## 2021-11-09 DIAGNOSIS — Z8673 Personal history of transient ischemic attack (TIA), and cerebral infarction without residual deficits: Secondary | ICD-10-CM | POA: Diagnosis not present

## 2021-11-09 DIAGNOSIS — R410 Disorientation, unspecified: Secondary | ICD-10-CM

## 2021-11-09 DIAGNOSIS — I6930 Unspecified sequelae of cerebral infarction: Secondary | ICD-10-CM | POA: Diagnosis not present

## 2021-11-09 NOTE — Progress Notes (Signed)
Established patient visit   Patient: Justin Harrington   DOB: November 02, 1945   76 y.o. Male  MRN: [id number] Visit Date: 11/09/2021  Today's healthcare provider: [name], MD   No chief complaint on file.  Subjective    HPI  Patient was originally scheduled for a face to face evaluation for home health services.  However, his family has been able to get an apartment for him at [facility].  Patient has forms to be fill out so he can get VA benefits.  Family did not mention FL2 form but patient may need.  Patient had sudden vision loss on 11/02/21 and family had him seen at [facility].  From there he was sent to [facility] ER for probable stroke.  Patient had CT and MRI while in the ER but then refused other testing or treatment.  MRI revealed large acute right PCA infarct.  CTA on 10-3 as follows: IMPRESSION: 1. 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 Associated large acute to subacute right PCA infarct without hemorrhagic transformation. 2. Intracranial atherosclerosis including mild bilateral V4 and moderate left P2 stenoses. 3. Cervical carotid atherosclerosis without significant common carotid or internal carotid artery stenosis. Prior right carotid endarterectomy. Severe right ECA origin stenosis.  He was prescribed clopidogrel. He was to continue atorvastatin 80, although he had not been taking it consistently earlier this year.  He does have carotid U/S and appointment with [facility] scheduled.   Patient has residual loss of vision on the right and impaired short term memory with some confusion since the stroke.  No other symptoms reported.  He is here today with his daughter who lives in [place]. Reports that he depends on a neighbor helping with meds. There are working on getting into [facility] living due to difficulties managing medications and inability to drive since CVA.  He does have follow up with eye doctor scheduled on November  8 Vascular follow up with Dr. Lucky Cowboy is scheduled 11-27-2021.   Medications: Outpatient Medications Prior to Visit  Medication Sig   amLODipine (NORVASC) 2.5 MG tablet Take 1 tablet (2.5 mg total) by mouth daily.   aspirin EC 81 MG tablet Take 81 mg by mouth daily.   atorvastatin (LIPITOR) 80 MG tablet Take 1 tablet (80 mg total) by mouth daily.   clopidogrel (PLAVIX) 75 MG tablet Take 1 tablet (75 mg total) by mouth daily.   FLUoxetine (PROZAC) 20 MG capsule Take 1 capsule (20 mg total) by mouth daily.   hydrochlorothiazide (HYDRODIURIL) 25 MG tablet TAKE 1 TABLET(25 MG) BY MOUTH DAILY   naloxone (NARCAN) nasal spray 4 mg/0.1 mL Place 4 mg into the nose as directed.   oxyCODONE (ROXICODONE) 15 MG immediate release tablet Take 1 tablet (15 mg total) by mouth 4 (four) times daily as needed for pain.   polyethylene glycol (MIRALAX / GLYCOLAX) 17 g packet Take 17 g by mouth daily as needed.   Sennosides (EX-LAX PO) Take 3-4 tablets by mouth every 3 (three) days as needed (constipation).   No facility-administered medications prior to visit.    Review of Systems  Eyes:  Positive for visual disturbance.  Cardiovascular:  Negative for chest pain, palpitations and leg swelling.  Neurological:  Negative for dizziness, weakness, light-headedness and headaches.       Objective    BP 128/75 (BP Location: Left Arm, Patient Position: Sitting, Cuff Size: Normal)   Pulse 63   Temp 98.2 F (36.8  C) (Oral)   Wt 161 lb (73 kg)   SpO2 100%   BMI 21.24 kg/m    Physical Exam   General: Appearance:    Well developed, well nourished male in no acute distress  Eyes:    PERRL, conjunctiva/corneas clear, EOM's intact       Lungs:     Clear to auscultation bilaterally, respirations unlabored  Heart:    Normal heart rate. Normal rhythm. No murmurs, rubs, or gallops.    MS:   All extremities are intact.    Neurologic:   Awake, alert, oriented x 3.          Assessment & Plan     1. Confusion  2.  Vision disturbance Secondary to recent CVA. Is not able to safely his own medications.   - Ambulatory referral to [place] moving into assisted living later this month.   3. Late effect of cerebrovascular accident (CVA)  4. History of CVA in adulthood Continue clopidogrel and aspirin. Is now  taking statin consistently. Will recheck lipids at follow up with LDL goal at least below 70       The entirety of the information documented in the History of Present Illness, Review of Systems and Physical Exam were personally obtained by me. Portions of this information were initially documented by the CMA and reviewed by me for thoroughness and accuracy.     [name], MD  [facility] [phone] (phone) [phone] (fax)  [place]

## 2021-11-09 NOTE — Patient Instructions (Signed)
.   Please review the attached list of medications and notify my office if there are any errors.   . Please bring all of your medications to every appointment so we can make sure that our medication list is the same as yours.   

## 2021-11-19 ENCOUNTER — Telehealth: Payer: Self-pay

## 2021-11-19 NOTE — Telephone Encounter (Signed)
That's fine

## 2021-11-19 NOTE — Telephone Encounter (Signed)
Copied from [place] [id number]. Topic: General - Other >> Nov 19, 2021 12:53 PM Ludger Nutting wrote: [place] Verbal Orders - Caller/Agency: [place] Number: [phone] Requesting OT/PT/Skilled Nursing/Social Work/Speech Therapy: OT Frequency: 1w8

## 2021-11-19 NOTE — Telephone Encounter (Signed)
Tried returning Shireen's call. Left detailed message on secure voice message system.

## 2021-11-20 ENCOUNTER — Other Ambulatory Visit: Payer: Self-pay | Admitting: Family Medicine

## 2021-11-20 DIAGNOSIS — G8929 Other chronic pain: Secondary | ICD-10-CM

## 2021-11-20 NOTE — Telephone Encounter (Signed)
Medication Refill - Medication: oxyCODONE (ROXICODONE) 15 MG immediate release tablet    Has the patient contacted their pharmacy? Has to contact pcp or oxycodone (Agent: If no, request that the patient contact the pharmacy for the refill. If patient does not wish to contact the pharmacy document the reason why and proceed with request.) (Agent: If yes, when and what did the pharmacy advise?)  Preferred Pharmacy (with phone number or street name):  [id number] - [place], [place] [address] AT [place] Phone:  [phone]  Fax:  [phone]     Has the patient been seen for an appointment in the last year OR does the patient have an upcoming appointment? yes  Agent: Please be advised that RX refills may take up to 3 business days. We ask that you follow-up with your pharmacy.

## 2021-11-20 NOTE — Telephone Encounter (Signed)
Requested medication (s) are due for refill today: yes  Requested medication (s) are on the active medication list: yes  Last refill:  10/30/21  Future visit scheduled: yes  Notes to clinic:  Unable to refill per protocol, cannot delegate.      Requested Prescriptions  Pending Prescriptions Disp Refills   oxyCODONE (ROXICODONE) 15 MG immediate release tablet 120 tablet 0    Sig: Take 1 tablet (15 mg total) by mouth 4 (four) times daily as needed for pain.     Not Delegated - Analgesics:  Opioid Agonists Failed - 11/20/2021 12:31 PM      Failed - This refill cannot be delegated      Failed - Urine Drug Screen completed in last 360 days      Passed - Valid encounter within last 3 months    Recent Outpatient Visits           1 week ago [place], [name] E, MD   2 months ago Annual physical exam   [facility] [name], MD   7 months ago Chronic, continuous use of opioids   [facility] [name], MD   1 year ago Syncope and collapse   [facility] [name], MD   1 year ago Primary hypertension   [facility] [name], MD       Future Appointments             In 3 weeks Fisher, [name], MD [facility], [place]

## 2021-11-23 ENCOUNTER — Telehealth: Payer: Self-pay | Admitting: Family Medicine

## 2021-11-23 NOTE — Telephone Encounter (Signed)
Name:  Justin Harrington  Phone Number:  [phone]  Relationship:Daughter  Is calling to check on the status of FL-2 & VA paperwork. Please advise

## 2021-11-24 MED ORDER — OXYCODONE HCL 15 MG PO TABS: 15.0000 mg | ORAL_TABLET | Freq: Four times a day (QID) | ORAL | 0 refills | Status: DC | PRN

## 2021-11-24 NOTE — Telephone Encounter (Signed)
Are the originals still in the office? The scanned copies are in the medica section of his chart.    Also, It looks like Dr. Caryn Section requested that his CMA notify patient that a vision check was needed. I don't see that a vision check was done.

## 2021-11-24 NOTE — Telephone Encounter (Signed)
Actually, the FL-2 form is scanned into the chart along with the VA forms... they are all in the Media Tab. I don't know what Medical Records did with the originals.

## 2021-11-24 NOTE — Telephone Encounter (Signed)
I have printed the copies and left them up front for the patient's daughter

## 2021-11-24 NOTE — Telephone Encounter (Signed)
Stanton Kidney, Daughter Daughter has called again re dad's  FMLA, pls contact her when complete [phone]

## 2021-11-24 NOTE — Telephone Encounter (Signed)
I completed the VA forms which are scanned in as of 11-12-2021.... I don't remember any other forms.

## 2021-11-27 ENCOUNTER — Encounter (INDEPENDENT_AMBULATORY_CARE_PROVIDER_SITE_OTHER): Payer: Medicare HMO

## 2021-11-27 ENCOUNTER — Ambulatory Visit (INDEPENDENT_AMBULATORY_CARE_PROVIDER_SITE_OTHER): Payer: Medicare HMO | Admitting: Vascular Surgery

## 2021-11-27 DIAGNOSIS — I5032 Chronic diastolic (congestive) heart failure: Secondary | ICD-10-CM | POA: Diagnosis not present

## 2021-11-27 DIAGNOSIS — I739 Peripheral vascular disease, unspecified: Secondary | ICD-10-CM | POA: Diagnosis not present

## 2021-11-27 DIAGNOSIS — G894 Chronic pain syndrome: Secondary | ICD-10-CM

## 2021-11-27 DIAGNOSIS — M961 Postlaminectomy syndrome, not elsewhere classified: Secondary | ICD-10-CM | POA: Diagnosis not present

## 2021-11-27 DIAGNOSIS — H538 Other visual disturbances: Secondary | ICD-10-CM | POA: Diagnosis not present

## 2021-11-27 DIAGNOSIS — I130 Hypertensive heart and chronic kidney disease with heart failure and stage 1 through stage 4 chronic kidney disease, or unspecified chronic kidney disease: Secondary | ICD-10-CM | POA: Diagnosis not present

## 2021-11-27 DIAGNOSIS — N1831 Chronic kidney disease, stage 3a: Secondary | ICD-10-CM | POA: Diagnosis not present

## 2021-11-27 DIAGNOSIS — I69398 Other sequelae of cerebral infarction: Secondary | ICD-10-CM | POA: Diagnosis not present

## 2021-11-27 DIAGNOSIS — R2689 Other abnormalities of gait and mobility: Secondary | ICD-10-CM | POA: Diagnosis not present

## 2021-11-27 DIAGNOSIS — I2510 Atherosclerotic heart disease of native coronary artery without angina pectoris: Secondary | ICD-10-CM | POA: Diagnosis not present

## 2021-11-30 ENCOUNTER — Encounter (INDEPENDENT_AMBULATORY_CARE_PROVIDER_SITE_OTHER): Payer: Self-pay

## 2021-12-04 ENCOUNTER — Other Ambulatory Visit: Payer: Self-pay | Admitting: Family Medicine

## 2021-12-04 DIAGNOSIS — G4700 Insomnia, unspecified: Secondary | ICD-10-CM

## 2021-12-04 NOTE — Telephone Encounter (Signed)
Requested Prescriptions  Refused Prescriptions Disp Refills   traZODone (DESYREL) 150 MG tablet [Pharmacy Med Name: TRAZODONE '150MG'$  (HUNDRED-FIFTY) TAB] 35 tablet 0    Sig: TAKE 1 TABLET(150 MG) BY MOUTH AT BEDTIME AS NEEDED FOR SLEEP. GIVEN ENOUGH REFILL TO LAST UNTIL UPCOMING APPOINTMENT     Psychiatry: Antidepressants - Serotonin Modulator Passed - 12/04/2021  3:05 PM      Passed - Valid encounter within last 6 months    Recent Outpatient Visits           3 weeks ago Confusion   [facility] [name], MD   3 months ago Annual physical exam   [facility] [name], MD   8 months ago Chronic, continuous use of opioids   [facility] [name], MD   1 year ago Syncope and collapse   [facility] [name], MD   2 years ago Primary hypertension   [facility] [name], MD       Future Appointments             In 1 week Fisher, [name], MD [facility], [place]

## 2021-12-11 ENCOUNTER — Ambulatory Visit: Payer: Medicare HMO | Admitting: Family Medicine

## 2021-12-16 DIAGNOSIS — H34832 Tributary (branch) retinal vein occlusion, left eye, with macular edema: Secondary | ICD-10-CM | POA: Diagnosis not present

## 2021-12-17 ENCOUNTER — Telehealth: Payer: Self-pay | Admitting: Family Medicine

## 2021-12-17 DIAGNOSIS — G8929 Other chronic pain: Secondary | ICD-10-CM

## 2021-12-17 NOTE — Telephone Encounter (Signed)
Medication Refill - Medication: oxyCODONE (ROXICODONE) 15 MG immediate release tablet   Has the patient contacted their pharmacy? No.  (Agent: If no, request that the patient contact the pharmacy for the refill. If patient does not wish to contact the pharmacy document the reason why and proceed with request.) Patient relocated and has new pharmacy.     Preferred Pharmacy (with phone number or street name):   [place], [place] Phone: [phone]  Fax: [phone]     Has the patient been seen for an appointment in the last year OR does the patient have an upcoming appointment? Yes.    Agent: Please be advised that RX refills may take up to 3 business days. We ask that you follow-up with your pharmacy.  Caller would like all medications sent to new pharmacy.

## 2021-12-17 NOTE — Telephone Encounter (Signed)
Requested medication (s) are due for refill today: yes  Requested medication (s) are on the active medication list: yes  Last refill:  11/24/21 #120 0 refills  Future visit scheduled: yes in 1 week   Notes to clinic:  not delegated per protocol. Do you want to refill Rx?     Requested Prescriptions  Pending Prescriptions Disp Refills   oxyCODONE (ROXICODONE) 15 MG immediate release tablet 120 tablet 0    Sig: Take 1 tablet (15 mg total) by mouth 4 (four) times daily as needed for pain.     Not Delegated - Analgesics:  Opioid Agonists Failed - 12/17/2021 11:30 AM      Failed - This refill cannot be delegated      Failed - Urine Drug Screen completed in last 360 days      Failed - Valid encounter within last 3 months    Recent Outpatient Visits           1 month ago Confusion   [facility] [name], MD   3 months ago Annual physical exam   [facility] [name], MD   8 months ago Chronic, continuous use of opioids   [facility] [name], MD   1 year ago Syncope and collapse   [facility] [name], MD   2 years ago Primary hypertension   [facility] [name], MD       Future Appointments             In 1 week Fisher, [name], MD [facility], [employer]

## 2021-12-20 MED ORDER — OXYCODONE HCL 15 MG PO TABS: 15.0000 mg | ORAL_TABLET | Freq: Four times a day (QID) | ORAL | 0 refills | Status: DC | PRN

## 2021-12-21 ENCOUNTER — Other Ambulatory Visit: Payer: Self-pay | Admitting: Family Medicine

## 2021-12-21 DIAGNOSIS — F4321 Adjustment disorder with depressed mood: Secondary | ICD-10-CM

## 2021-12-21 NOTE — Telephone Encounter (Signed)
Carol at [facility], states that the rfill for Oxycodone '15mg'$ , they dont have in stock

## 2021-12-21 NOTE — Telephone Encounter (Signed)
Medication Refill - Medication: FLUoxetine (PROZAC) 20 MG capsule   Has the patient contacted their pharmacy? Yes.    Preferred Pharmacy (with phone number or street name):  [place], [place] - [place] Phone: [phone]  Fax: [phone]     Has the patient been seen for an appointment in the last year OR does the patient have an upcoming appointment? Yes.    Agent: Please be advised that RX refills may take up to 3 business days. We ask that you follow-up with your pharmacy.

## 2021-12-21 NOTE — Telephone Encounter (Signed)
Do they have another dose in stock, if not then patient will need to call other pharmacies to find one that does have it

## 2021-12-22 NOTE — Telephone Encounter (Signed)
Requested medication (s) are due for refill today: routing for review  Requested medication (s) are on the active medication list:yes  Last refill:  11/03/21  Future visit scheduled: yes  Notes to clinic:  Unable to refill per protocol, last refill by another provider.      Requested Prescriptions  Pending Prescriptions Disp Refills   FLUoxetine (PROZAC) 20 MG capsule 30 capsule 1    Sig: Take 1 capsule (20 mg total) by mouth daily.     Psychiatry:  Antidepressants - SSRI Passed - 12/21/2021  2:53 PM      Passed - Valid encounter within last 6 months    Recent Outpatient Visits           1 month ago Confusion   [facility] [name], MD   3 months ago Annual physical exam   [facility] [name], MD   8 months ago Chronic, continuous use of opioids   [facility] [name], MD   2 years ago Syncope and collapse   [facility] [name], MD   2 years ago Primary hypertension   [facility] [name], MD       Future Appointments             In 1 week Fisher, [name], MD [facility] [place]

## 2021-12-22 NOTE — Telephone Encounter (Signed)
I called and spoke with pharmacist from [place]. I was informed that they have the Oxycodone '15mg'$  dose in stock, but they are no longer taking any new patients with prescriptions for pain medication. Pharmacist states that they only have enough for their established patients. I called patient and advised him of this. Patient is going to call [facility] pharmacy to speak to the pharmacist about this.

## 2021-12-29 ENCOUNTER — Other Ambulatory Visit (INDEPENDENT_AMBULATORY_CARE_PROVIDER_SITE_OTHER): Payer: Self-pay | Admitting: Nurse Practitioner

## 2021-12-29 DIAGNOSIS — I7143 Infrarenal abdominal aortic aneurysm, without rupture: Secondary | ICD-10-CM

## 2021-12-29 DIAGNOSIS — I739 Peripheral vascular disease, unspecified: Secondary | ICD-10-CM

## 2021-12-30 ENCOUNTER — Ambulatory Visit (INDEPENDENT_AMBULATORY_CARE_PROVIDER_SITE_OTHER): Payer: Medicare HMO | Admitting: Nurse Practitioner

## 2021-12-30 ENCOUNTER — Ambulatory Visit (INDEPENDENT_AMBULATORY_CARE_PROVIDER_SITE_OTHER): Payer: Medicare HMO

## 2021-12-30 ENCOUNTER — Encounter (INDEPENDENT_AMBULATORY_CARE_PROVIDER_SITE_OTHER): Payer: Self-pay | Admitting: Nurse Practitioner

## 2021-12-30 ENCOUNTER — Encounter: Payer: Self-pay | Admitting: Family Medicine

## 2021-12-30 ENCOUNTER — Telehealth (INDEPENDENT_AMBULATORY_CARE_PROVIDER_SITE_OTHER): Payer: Self-pay

## 2021-12-30 ENCOUNTER — Ambulatory Visit (INDEPENDENT_AMBULATORY_CARE_PROVIDER_SITE_OTHER): Payer: Medicare HMO | Admitting: Family Medicine

## 2021-12-30 VITALS — BP 117/68 | HR 82 | Temp 97.9°F | Resp 18 | Wt 157.0 lb

## 2021-12-30 VITALS — BP 121/73 | HR 87 | Resp 18 | Ht 74.0 in | Wt 158.0 lb

## 2021-12-30 DIAGNOSIS — I70213 Atherosclerosis of native arteries of extremities with intermittent claudication, bilateral legs: Secondary | ICD-10-CM | POA: Diagnosis not present

## 2021-12-30 DIAGNOSIS — I7143 Infrarenal abdominal aortic aneurysm, without rupture: Secondary | ICD-10-CM | POA: Diagnosis not present

## 2021-12-30 DIAGNOSIS — I739 Peripheral vascular disease, unspecified: Secondary | ICD-10-CM

## 2021-12-30 DIAGNOSIS — I719 Aortic aneurysm of unspecified site, without rupture: Secondary | ICD-10-CM | POA: Diagnosis not present

## 2021-12-30 DIAGNOSIS — Z9889 Other specified postprocedural states: Secondary | ICD-10-CM

## 2021-12-30 DIAGNOSIS — E7800 Pure hypercholesterolemia, unspecified: Secondary | ICD-10-CM

## 2021-12-30 DIAGNOSIS — I700 Atherosclerosis of aorta: Secondary | ICD-10-CM

## 2021-12-30 DIAGNOSIS — I6930 Unspecified sequelae of cerebral infarction: Secondary | ICD-10-CM | POA: Diagnosis not present

## 2021-12-30 DIAGNOSIS — I10 Essential (primary) hypertension: Secondary | ICD-10-CM

## 2021-12-30 NOTE — Progress Notes (Signed)
I,Roshena L Chambers,acting as a scribe for [name], MD.,have documented all relevant documentation on the behalf of [name], MD,as directed by  [name], MD while in the presence of [name], MD.    Established patient visit   Patient: Justin Harrington   DOB: 06/02/1945   76 y.o. Male  MRN: [id number] Visit Date: 12/30/2021  Today's healthcare provider: [name], MD   Chief Complaint  Patient presents with   Hyperlipidemia   Subjective    HPI  Lipid/Cholesterol, Follow-up  Last lipid panel Other pertinent labs  Lab Results  Component Value Date   CHOL 200 11/03/2021   HDL 47 11/03/2021   LDLCALC 137 (H) 11/03/2021   TRIG 80 11/03/2021   CHOLHDL 4.3 11/03/2021   Lab Results  Component Value Date   ALT 11 11/02/2021   AST 20 11/02/2021   PLT 188 11/02/2021   TSH 1.640 09/08/2021     He was last seen for this 1 1/2 months ago. At that time he had recently been found to have had a CVA affecting his vision, prior to which he had not been taking his statin. Since then he has been taking atorvastatin '80mg'$  consistently every day.   He reports good compliance with treatment. He is not having side effects.   Symptoms: No chest pain No chest pressure/discomfort  No dyspnea No lower extremity edema  No numbness or tingling of extremity No orthopnea  No palpitations No paroxysmal nocturnal dyspnea  No speech difficulty No syncope   Current diet: well balanced Current exercise: none  The ASCVD Risk score (Arnett DK, et al., 2019) failed to calculate for the following reasons:   The patient has a prior MI or stroke diagnosis  --------------------------------------------------------------------------------------------------- Of note is that he has now moved to [facility].   Medications: Outpatient Medications Prior to Visit  Medication Sig   amLODipine (NORVASC) 2.5 MG tablet Take 1 tablet (2.5 mg total) by mouth daily.    aspirin EC 81 MG tablet Take 81 mg by mouth daily.   atorvastatin (LIPITOR) 80 MG tablet Take 1 tablet (80 mg total) by mouth daily.   clopidogrel (PLAVIX) 75 MG tablet Take 1 tablet (75 mg total) by mouth daily.   FLUoxetine (PROZAC) 20 MG capsule Take 1 capsule (20 mg total) by mouth daily.   hydrochlorothiazide (HYDRODIURIL) 25 MG tablet TAKE 1 TABLET(25 MG) BY MOUTH DAILY   naloxone (NARCAN) nasal spray 4 mg/0.1 mL Place 4 mg into the nose as directed.   oxyCODONE (ROXICODONE) 15 MG immediate release tablet Take 1 tablet (15 mg total) by mouth 4 (four) times daily as needed for pain.   polyethylene glycol (MIRALAX / GLYCOLAX) 17 g packet Take 17 g by mouth daily as needed.   Sennosides (EX-LAX PO) Take 3-4 tablets by mouth every 3 (three) days as needed (constipation).   No facility-administered medications prior to visit.    Review of Systems  Constitutional:  Negative for appetite change, chills and fever.  Respiratory:  Negative for chest tightness, shortness of breath and wheezing.   Cardiovascular:  Negative for chest pain and palpitations.  Gastrointestinal:  Negative for abdominal pain, nausea and vomiting.       Objective    BP 117/68 (BP Location: Left Arm, Patient Position: Sitting, Cuff Size: Normal)   Pulse 82   Temp 97.9 F (36.6 C) (Oral)   Resp 18   Wt 157 lb (71.2 kg)   SpO2  99% Comment: room air  BMI 20.16 kg/m    Physical Exam   General: Appearance:    Well developed, well nourished male in no acute distress  Eyes:    PERRL, conjunctiva/corneas clear, EOM's intact       Lungs:     Clear to auscultation bilaterally, respirations unlabored  Heart:    Normal heart rate. Normal rhythm. No murmurs, rubs, or gallops.    MS:   All extremities are intact.    Neurologic:   Awake, alert, oriented x 3. No apparent focal neurological defect.         Assessment & Plan     1. Hypercholesteremia Now taking and tolerating atorvastatin well with no adverse  effects.   - Lipid panel   2. Primary hypertension Well controlled.  Continue current medications.   - Renal function panel  3. Hypomagnesemia - Magnesium  4. Late effect of cerebrovascular accident (CVA) He states he does have upcoming appointment with ophthalmology.       The entirety of the information documented in the History of Present Illness, Review of Systems and Physical Exam were personally obtained by me. Portions of this information were initially documented by the CMA and reviewed by me for thoroughness and accuracy.     [name], MD  [facility] [phone] (phone) [phone] (fax)  [place]

## 2021-12-30 NOTE — Telephone Encounter (Unsigned)
Spoke with the patient and his daughter and offered 01/04/22 to have his RLE angio with Dr. Lucky Cowboy pending prior authorization. Patient's daughter wanted to check her calendar. The daughter called right back and the patient will be scheduled for 01/04/22 with a 8:45 [facility]. Pre-procedure instructions will be mailed.

## 2021-12-30 NOTE — Patient Instructions (Signed)
Please review the attached list of medications and notify my office if there are any errors.   Please go to the lab draw station in [address] on the second floor of [facility]  when you are fasting for 8 hours. Normal hours are 8:00am to 11:30am and 1:00pm to 4:00pm Monday through Friday     

## 2021-12-31 ENCOUNTER — Encounter (INDEPENDENT_AMBULATORY_CARE_PROVIDER_SITE_OTHER): Payer: Self-pay | Admitting: Nurse Practitioner

## 2021-12-31 DIAGNOSIS — E7800 Pure hypercholesterolemia, unspecified: Secondary | ICD-10-CM | POA: Diagnosis not present

## 2021-12-31 DIAGNOSIS — I10 Essential (primary) hypertension: Secondary | ICD-10-CM | POA: Diagnosis not present

## 2022-01-01 ENCOUNTER — Encounter: Payer: Self-pay | Admitting: *Deleted

## 2022-01-01 LAB — RENAL FUNCTION PANEL
Albumin: 4.4 g/dL (ref 3.8–4.8)
BUN/Creatinine Ratio: 21 (ref 10–24)
BUN: 35 mg/dL — ABNORMAL HIGH (ref 8–27)
CO2: 26 mmol/L (ref 20–29)
Calcium: 9.4 mg/dL (ref 8.6–10.2)
Chloride: 100 mmol/L (ref 96–106)
Creatinine, Ser: 1.65 mg/dL — ABNORMAL HIGH (ref 0.76–1.27)
Glucose: 99 mg/dL (ref 70–99)
Phosphorus: 4.2 mg/dL — ABNORMAL HIGH (ref 2.8–4.1)
Potassium: 4.3 mmol/L (ref 3.5–5.2)
Sodium: 141 mmol/L (ref 134–144)
eGFR: 43 mL/min/{1.73_m2} — ABNORMAL LOW (ref >59)

## 2022-01-01 LAB — LIPID PANEL
Chol/HDL Ratio: 2.5 ratio (ref 0.0–5.0)
Cholesterol, Total: 133 mg/dL (ref 100–199)
HDL: 54 mg/dL (ref >39)
LDL Chol Calc (NIH): 64 mg/dL (ref 0–99)
Triglycerides: 78 mg/dL (ref 0–149)
VLDL Cholesterol Cal: 15 mg/dL (ref 5–40)

## 2022-01-01 LAB — MAGNESIUM: Magnesium: 1.7 mg/dL (ref 1.6–2.3)

## 2022-01-04 ENCOUNTER — Encounter: Payer: Self-pay | Admitting: Family Medicine

## 2022-01-04 NOTE — H&P (View-Only) (Signed)
Subjective:    Patient ID: Justin Harrington, male    DOB: 30-Aug-1945, 76 y.o.   MRN: [id number] No chief complaint on file.   The patient returns to the office for surveillance of an abdominal aortic aneurysm status post stent graft placement on 03/29/2018.    Patient denies abdominal pain or back pain, no other abdominal complaints. No groin related complaints.  There are some symptoms consistent with claudication. There have been no interval changes in his overall healthcare since his last visit.    Patient denies amaurosis fugax or TIA symptoms. There is no history of claudication or rest pain symptoms of the lower extremities. The patient denies angina or shortness of breath.    Duplex US of the aorta and iliac arteries shows a 4.12 AAA sac with no endoleak, decrease in the sac compared to the previous study.  the patient also underwent bilateral ABIs he has an ABI 0.65 on the right and 0.96 on the left.  The patient has monophasic tibial artery waveforms on the right.  The patient does have a noted SFA occlusion.  The patient has biphasic/triphasic waveforms on the left with slightly diminished toe waveforms.  Toe waveforms on the right are also markedly diminished.   Patient also has a known history of carotid artery stenosis.  His most recent scan was done at an outside facility but this indicated less than 50% stenosis bilaterally.  There is no recent history of TIA symptoms or focal motor deficits. There is no prior documented CVA.   The patient was not taking enteric-coated aspirin 81 mg daily at the time.   There is no history of migraine headaches or prior diagnosis of ocular migraine. There is no history of seizures.   The patient has a history of coronary artery disease, no recent episodes of angina or shortness of breath. The patient denies PAD or claudication symptoms. There is a history of hyperlipidemia which is being treated with a statin.       Review of Systems   Skin:  Negative for wound.  All other systems reviewed and are negative.      Objective:   Physical Exam Vitals reviewed.  HENT:     Head: Normocephalic.  Neck:     Vascular: No carotid bruit.  Cardiovascular:     Rate and Rhythm: Normal rate.     Pulses:          Dorsalis pedis pulses are detected w/ Doppler on the right side and 1+ on the left side.       Posterior tibial pulses are detected w/ Doppler on the right side and 1+ on the left side.  Pulmonary:     Effort: Pulmonary effort is normal.  Skin:    General: Skin is warm and dry.  Neurological:     Mental Status: He is alert and oriented to person, place, and time.  Psychiatric:        Mood and Affect: Mood normal.        Behavior: Behavior normal.        Thought Content: Thought content normal.        Judgment: Judgment normal.     BP 121/73 (BP Location: Left Arm)   Pulse 87   Resp 18   Ht '6\' 2"'$  (1.88 m)   Wt 158 lb (71.7 kg)   BMI 20.29 kg/m   Past Medical History:  Diagnosis Date   Aortic aneurysm (HCC)    Coronary artery disease  DVT (deep venous thrombosis) (HCC)    H/O DVT in left leg, filter placed approximatly 8 yrs ago   H/O spinal fusion 1998   Back pain   Hypertension    controlled on meds   Personal history of tobacco use, presenting hazards to health 06/12/2015   Personal history of tobacco use, presenting hazards to health 06/12/2015   Prostate cancer [facility])    Had prostatectomy   Wears dentures    upper    Social History   Socioeconomic History   Marital status: Widowed    Spouse name: Not on file   Number of children: 2   Years of education: HS Diploma   Highest education level: 12th grade  Occupational History   Occupation: Retired  Tobacco Use   Smoking status: Every Day    Packs/day: 0.50    Years: 50.00    Total pack years: 25.00    Types: Cigarettes   Smokeless tobacco: Never  Vaping Use   Vaping Use: Never used  Substance and Sexual Activity   Alcohol use:  No    Alcohol/week: 0.0 standard drinks of alcohol   Drug use: No   Sexual activity: Not on file  Other Topics Concern   Not on file  Social History Narrative   Son passed at age 47 from cerebral palsy.   Wife passed away in 05/01/21 from stomach cancer   Social Determinants of Health   Financial Resource Strain: Low Risk  (01/28/2021)   Overall Financial Resource Strain (CARDIA)    Difficulty of Paying Living Expenses: Not hard at all  Food Insecurity: No Food Insecurity (11/03/2021)   Hunger Vital Sign    Worried About Running Out of Food in the Last Year: Never true    Ran Out of Food in the Last Year: Never true  Transportation Needs: Unmet Transportation Needs (11/03/2021)   PRAPARE - Hydrologist (Medical): Yes    Lack of Transportation (Non-Medical): Yes  Physical Activity: Inactive (01/28/2021)   Exercise Vital Sign    Days of Exercise per Week: 0 days    Minutes of Exercise per Session: 0 min  Stress: No Stress Concern Present (01/28/2021)   [place]    Feeling of Stress : Not at all  Social Connections: Moderately Integrated (01/28/2021)   Social Connection and Isolation Panel [NHANES]    Frequency of Communication with Friends and Family: Three times a week    Frequency of Social Gatherings with Friends and Family: More than three times a week    Attends Religious Services: More than 4 times per year    Active Member of Clubs or Organizations: No    Attends Archivist Meetings: Never    Marital Status: Married  Human resources officer Violence: Not At Risk (11/03/2021)   Humiliation, Afraid, Rape, and Kick questionnaire    Fear of Current or Ex-Partner: No    Emotionally Abused: No    Physically Abused: No    Sexually Abused: No    Past Surgical History:  Procedure Laterality Date   BACK SURGERY  05-01-96   Lumbar spine fusion   carotid doppler ultrasound  10/05/2011    75-90% RCA occlusion, 50% on left   CAROTID ENDARTERECTOMY Right 12/02/2011   Dr. Lucky Cowboy   CHOLECYSTECTOMY  10/02/2010   Laparoscopic, Dr. Pat Patrick, [place] PROPOFOL N/A 06/20/2017   Procedure: COLONOSCOPY WITH PROPOFOL;  Surgeon: [name], MD;  Location: [facility];  Service: Endoscopy;  Laterality: N/A;   CT SCAN  10/02/2010   [facility]; Infrarenal suprailiac abdominal aortic aneurysm maximum dimention of nearly 4cm. Cholelithiasis and acute cholecystiis. Mild enlargement of left adrenal gland   ENDOVASCULAR REPAIR/STENT GRAFT N/A 03/29/2018   Procedure: ENDOVASCULAR REPAIR/STENT GRAFT;  Surgeon: Algernon Huxley, MD;  Location: [facility];  Service: Cardiovascular;  Laterality: N/A;   LUMBAR SPINE HARDWARE REMOVAL  07/30/2016   [name], MD   PROSTATE SURGERY  10/19/2012   prostatectomy. [facility]; Laser assisted, Done by [name] for prostate cancer    Family History  Problem Relation Age of Onset   Heart attack Mother    Congestive Heart Failure Father    Cerebral palsy Son     Allergies  Allergen Reactions   Zolpidem     Other reaction(s): Other (See Comments) GOT UP IN MIDDLE OF NIGHT AND FILLED ALL THE GLASSES WITH FLOUR       Latest Ref Rng & Units 11/02/2021    6:10 PM 09/08/2021   10:50 AM 03/30/2021   10:41 AM  CBC  WBC 4.0 - 10.5 K/uL 11.4  10.2  10.3   Hemoglobin 13.0 - 17.0 g/dL 16.3  15.8  15.6   Hematocrit 39.0 - 52.0 % 48.6  46.4  45.4   Platelets 150 - 400 K/uL 188  192  195       CMP     Component Value Date/Time   NA 141 12/31/2021 0837   NA 144 12/03/2011 0613   K 4.3 12/31/2021 0837   K 3.5 12/03/2011 0613   CL 100 12/31/2021 0837   CL 108 (H) 12/03/2011 0613   CO2 26 12/31/2021 0837   CO2 27 12/03/2011 0613   GLUCOSE 99 12/31/2021 0837   GLUCOSE 111 (H) 11/02/2021 1810   GLUCOSE 94 12/03/2011 0613   BUN 35 (H) 12/31/2021 0837   BUN 9 12/03/2011 0613   CREATININE 1.65 (H) 12/31/2021 0837    CREATININE 0.83 12/03/2011 0613   CALCIUM 9.4 12/31/2021 0837   CALCIUM 8.7 12/03/2011 0613   PROT 8.2 (H) 11/02/2021 1810   PROT 6.7 09/08/2021 1050   ALBUMIN 4.4 12/31/2021 0837   AST 20 11/02/2021 1810   ALT 11 11/02/2021 1810   ALKPHOS 60 11/02/2021 1810   BILITOT 1.1 11/02/2021 1810   BILITOT 0.6 09/08/2021 1050   GFRNONAA 44 (L) 11/02/2021 1810   GFRNONAA >60 12/03/2011 0613   GFRAA 56 (L) 11/27/2019 1145   GFRAA >60 12/03/2011 0613     No results found.     Assessment & Plan:   1. Infrarenal abdominal aortic aneurysm (AAA) without rupture (HCC) Recommend:  Patient is status post successful endovascular repair of the AAA.   No further intervention is required at this time.   No endoleak is detected and the aneurysm sac is stable.  The patient will continue antiplatelet therapy as prescribed as well as aggressive management of hyperlipidemia. Exercise is again strongly encouraged.   However, endografts require continued surveillance with ultrasound or CT scan. This is mandatory to detect any changes that allow repressurization of the aneurysm sac.  The patient is informed that this would be asymptomatic.  The patient is reminded that lifelong routine surveillance is a necessity with an endograft. Patient will continue to follow-up at the specified interval with ultrasound of the aorta.  2. Bilateral carotid artery stenosis with hx of right-sided carotid endarterectomy Previous studies indicate stable  carotid disease.  Less than 50% stenosis of the ICAs per outside study done on 11/03/2021.  Continue to follow annually.  3. Atherosclerosis of native artery of both lower extremities with intermittent claudication (HCC) Recommend:  The patient has experienced increased claudication symptoms and is now describing lifestyle limiting claudication and appears to be having mild rest pain symptroms.  Given the severity of the patient's severe right lower extremity symptoms the  patient should undergo angiography with the hope for intervention.  Risk and benefits were reviewed the patient.  Indications for the procedure were reviewed.  All questions were answered, the patient agrees to proceed with right lower extremity angiography and possible intervention.   The patient should continue walking and begin a more formal exercise program.  The patient should continue antiplatelet therapy and aggressive treatment of the lipid abnormalities  The patient will follow up with me after the angiogram.   4. 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  It was recommended to be monitored on an annual basis but the last scan was in 08/20/2019.  We can reevaluate with CT chest following his angiogram.     Current Outpatient Medications on File Prior to Visit  Medication Sig Dispense Refill   amLODipine (NORVASC) 2.5 MG tablet Take 1 tablet (2.5 mg total) by mouth daily. 30 tablet 1   aspirin EC 81 MG tablet Take 81 mg by mouth daily.     atorvastatin (LIPITOR) 80 MG tablet Take 1 tablet (80 mg total) by mouth daily. 90 tablet 4   clopidogrel (PLAVIX) 75 MG tablet Take 1 tablet (75 mg total) by mouth daily. 90 tablet 4   FLUoxetine (PROZAC) 20 MG capsule Take 1 capsule (20 mg total) by mouth daily. 30 capsule 1   hydrochlorothiazide (HYDRODIURIL) 25 MG tablet TAKE 1 TABLET(25 MG) BY MOUTH DAILY 90 tablet 4   naloxone (NARCAN) nasal spray 4 mg/0.1 mL Place 4 mg into the nose as directed.     oxyCODONE (ROXICODONE) 15 MG immediate release tablet Take 1 tablet (15 mg total) by mouth 4 (four) times daily as needed for pain. 120 tablet 0   polyethylene glycol (MIRALAX / GLYCOLAX) 17 g packet Take 17 g by mouth daily as needed.     Sennosides (EX-LAX PO) Take 3-4 tablets by mouth every 3 (three) days as needed (constipation).     No current facility-administered medications on file prior to visit.    There are no Patient  Instructions on file for this visit. No follow-ups on file.   Kris Hartmann, NP

## 2022-01-04 NOTE — Progress Notes (Signed)
Subjective:    Patient ID: Justin Harrington, male    DOB: 1945-03-19, 76 y.o.   MRN: [id number] No chief complaint on file.   The patient returns to the office for surveillance of an abdominal aortic aneurysm status post stent graft placement on 03/29/2018.    Patient denies abdominal pain or back pain, no other abdominal complaints. No groin related complaints.  There are some symptoms consistent with claudication. There have been no interval changes in his overall healthcare since his last visit.    Patient denies amaurosis fugax or TIA symptoms. There is no history of claudication or rest pain symptoms of the lower extremities. The patient denies angina or shortness of breath.    Duplex US of the aorta and iliac arteries shows a 4.12 AAA sac with no endoleak, decrease in the sac compared to the previous study.  the patient also underwent bilateral ABIs he has an ABI 0.65 on the right and 0.96 on the left.  The patient has monophasic tibial artery waveforms on the right.  The patient does have a noted SFA occlusion.  The patient has biphasic/triphasic waveforms on the left with slightly diminished toe waveforms.  Toe waveforms on the right are also markedly diminished.   Patient also has a known history of carotid artery stenosis.  His most recent scan was done at an outside facility but this indicated less than 50% stenosis bilaterally.  There is no recent history of TIA symptoms or focal motor deficits. There is no prior documented CVA.   The patient was not taking enteric-coated aspirin 81 mg daily at the time.   There is no history of migraine headaches or prior diagnosis of ocular migraine. There is no history of seizures.   The patient has a history of coronary artery disease, no recent episodes of angina or shortness of breath. The patient denies PAD or claudication symptoms. There is a history of hyperlipidemia which is being treated with a statin.       Review of Systems   Skin:  Negative for wound.  All other systems reviewed and are negative.      Objective:   Physical Exam Vitals reviewed.  HENT:     Head: Normocephalic.  Neck:     Vascular: No carotid bruit.  Cardiovascular:     Rate and Rhythm: Normal rate.     Pulses:          Dorsalis pedis pulses are detected w/ Doppler on the right side and 1+ on the left side.       Posterior tibial pulses are detected w/ Doppler on the right side and 1+ on the left side.  Pulmonary:     Effort: Pulmonary effort is normal.  Skin:    General: Skin is warm and dry.  Neurological:     Mental Status: He is alert and oriented to person, place, and time.  Psychiatric:        Mood and Affect: Mood normal.        Behavior: Behavior normal.        Thought Content: Thought content normal.        Judgment: Judgment normal.     BP 121/73 (BP Location: Left Arm)   Pulse 87   Resp 18   Ht '6\' 2"'$  (1.88 m)   Wt 158 lb (71.7 kg)   BMI 20.29 kg/m   Past Medical History:  Diagnosis Date   Aortic aneurysm (HCC)    Coronary artery disease  DVT (deep venous thrombosis) (HCC)    H/O DVT in left leg, filter placed approximatly 8 yrs ago   H/O spinal fusion 1998   Back pain   Hypertension    controlled on meds   Personal history of tobacco use, presenting hazards to health 06/12/2015   Personal history of tobacco use, presenting hazards to health 06/12/2015   Prostate cancer [facility])    Had prostatectomy   Wears dentures    upper    Social History   Socioeconomic History   Marital status: Widowed    Spouse name: Not on file   Number of children: 2   Years of education: HS Diploma   Highest education level: 12th grade  Occupational History   Occupation: Retired  Tobacco Use   Smoking status: Every Day    Packs/day: 0.50    Years: 50.00    Total pack years: 25.00    Types: Cigarettes   Smokeless tobacco: Never  Vaping Use   Vaping Use: Never used  Substance and Sexual Activity   Alcohol use:  No    Alcohol/week: 0.0 standard drinks of alcohol   Drug use: No   Sexual activity: Not on file  Other Topics Concern   Not on file  Social History Narrative   Son passed at age 61 from cerebral palsy.   Wife passed away in 2021-05-01 from stomach cancer   Social Determinants of Health   Financial Resource Strain: Low Risk  (01/28/2021)   Overall Financial Resource Strain (CARDIA)    Difficulty of Paying Living Expenses: Not hard at all  Food Insecurity: No Food Insecurity (11/03/2021)   Hunger Vital Sign    Worried About Running Out of Food in the Last Year: Never true    Ran Out of Food in the Last Year: Never true  Transportation Needs: Unmet Transportation Needs (11/03/2021)   PRAPARE - Hydrologist (Medical): Yes    Lack of Transportation (Non-Medical): Yes  Physical Activity: Inactive (01/28/2021)   Exercise Vital Sign    Days of Exercise per Week: 0 days    Minutes of Exercise per Session: 0 min  Stress: No Stress Concern Present (01/28/2021)   [place]    Feeling of Stress : Not at all  Social Connections: Moderately Integrated (01/28/2021)   Social Connection and Isolation Panel [NHANES]    Frequency of Communication with Friends and Family: Three times a week    Frequency of Social Gatherings with Friends and Family: More than three times a week    Attends Religious Services: More than 4 times per year    Active Member of Clubs or Organizations: No    Attends Archivist Meetings: Never    Marital Status: Married  Human resources officer Violence: Not At Risk (11/03/2021)   Humiliation, Afraid, Rape, and Kick questionnaire    Fear of Current or Ex-Partner: No    Emotionally Abused: No    Physically Abused: No    Sexually Abused: No    Past Surgical History:  Procedure Laterality Date   BACK SURGERY  May 01, 1996   Lumbar spine fusion   carotid doppler ultrasound  10/05/2011    75-90% RCA occlusion, 50% on left   CAROTID ENDARTERECTOMY Right 12/02/2011   Dr. Lucky Cowboy   CHOLECYSTECTOMY  10/02/2010   Laparoscopic, Dr. Pat Patrick, [place] PROPOFOL N/A 06/20/2017   Procedure: COLONOSCOPY WITH PROPOFOL;  Surgeon: [name], MD;  Location: [facility];  Service: Endoscopy;  Laterality: N/A;   CT SCAN  10/02/2010   [facility]; Infrarenal suprailiac abdominal aortic aneurysm maximum dimention of nearly 4cm. Cholelithiasis and acute cholecystiis. Mild enlargement of left adrenal gland   ENDOVASCULAR REPAIR/STENT GRAFT N/A 03/29/2018   Procedure: ENDOVASCULAR REPAIR/STENT GRAFT;  Surgeon: Algernon Huxley, MD;  Location: [facility];  Service: Cardiovascular;  Laterality: N/A;   LUMBAR SPINE HARDWARE REMOVAL  07/30/2016   [name], MD   PROSTATE SURGERY  10/19/2012   prostatectomy. [facility]; Laser assisted, Done by [name] for prostate cancer    Family History  Problem Relation Age of Onset   Heart attack Mother    Congestive Heart Failure Father    Cerebral palsy Son     Allergies  Allergen Reactions   Zolpidem     Other reaction(s): Other (See Comments) GOT UP IN MIDDLE OF NIGHT AND FILLED ALL THE GLASSES WITH FLOUR       Latest Ref Rng & Units 11/02/2021    6:10 PM 09/08/2021   10:50 AM 03/30/2021   10:41 AM  CBC  WBC 4.0 - 10.5 K/uL 11.4  10.2  10.3   Hemoglobin 13.0 - 17.0 g/dL 16.3  15.8  15.6   Hematocrit 39.0 - 52.0 % 48.6  46.4  45.4   Platelets 150 - 400 K/uL 188  192  195       CMP     Component Value Date/Time   NA 141 12/31/2021 0837   NA 144 12/03/2011 0613   K 4.3 12/31/2021 0837   K 3.5 12/03/2011 0613   CL 100 12/31/2021 0837   CL 108 (H) 12/03/2011 0613   CO2 26 12/31/2021 0837   CO2 27 12/03/2011 0613   GLUCOSE 99 12/31/2021 0837   GLUCOSE 111 (H) 11/02/2021 1810   GLUCOSE 94 12/03/2011 0613   BUN 35 (H) 12/31/2021 0837   BUN 9 12/03/2011 0613   CREATININE 1.65 (H) 12/31/2021 0837    CREATININE 0.83 12/03/2011 0613   CALCIUM 9.4 12/31/2021 0837   CALCIUM 8.7 12/03/2011 0613   PROT 8.2 (H) 11/02/2021 1810   PROT 6.7 09/08/2021 1050   ALBUMIN 4.4 12/31/2021 0837   AST 20 11/02/2021 1810   ALT 11 11/02/2021 1810   ALKPHOS 60 11/02/2021 1810   BILITOT 1.1 11/02/2021 1810   BILITOT 0.6 09/08/2021 1050   GFRNONAA 44 (L) 11/02/2021 1810   GFRNONAA >60 12/03/2011 0613   GFRAA 56 (L) 11/27/2019 1145   GFRAA >60 12/03/2011 0613     No results found.     Assessment & Plan:   1. Infrarenal abdominal aortic aneurysm (AAA) without rupture (HCC) Recommend:  Patient is status post successful endovascular repair of the AAA.   No further intervention is required at this time.   No endoleak is detected and the aneurysm sac is stable.  The patient will continue antiplatelet therapy as prescribed as well as aggressive management of hyperlipidemia. Exercise is again strongly encouraged.   However, endografts require continued surveillance with ultrasound or CT scan. This is mandatory to detect any changes that allow repressurization of the aneurysm sac.  The patient is informed that this would be asymptomatic.  The patient is reminded that lifelong routine surveillance is a necessity with an endograft. Patient will continue to follow-up at the specified interval with ultrasound of the aorta.  2. Bilateral carotid artery stenosis with hx of right-sided carotid endarterectomy Previous studies indicate stable  carotid disease.  Less than 50% stenosis of the ICAs per outside study done on 11/03/2021.  Continue to follow annually.  3. Atherosclerosis of native artery of both lower extremities with intermittent claudication (HCC) Recommend:  The patient has experienced increased claudication symptoms and is now describing lifestyle limiting claudication and appears to be having mild rest pain symptroms.  Given the severity of the patient's severe right lower extremity symptoms the  patient should undergo angiography with the hope for intervention.  Risk and benefits were reviewed the patient.  Indications for the procedure were reviewed.  All questions were answered, the patient agrees to proceed with right lower extremity angiography and possible intervention.   The patient should continue walking and begin a more formal exercise program.  The patient should continue antiplatelet therapy and aggressive treatment of the lipid abnormalities  The patient will follow up with me after the angiogram.   4. 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  It was recommended to be monitored on an annual basis but the last scan was in 08/20/2019.  We can reevaluate with CT chest following his angiogram.     Current Outpatient Medications on File Prior to Visit  Medication Sig Dispense Refill   amLODipine (NORVASC) 2.5 MG tablet Take 1 tablet (2.5 mg total) by mouth daily. 30 tablet 1   aspirin EC 81 MG tablet Take 81 mg by mouth daily.     atorvastatin (LIPITOR) 80 MG tablet Take 1 tablet (80 mg total) by mouth daily. 90 tablet 4   clopidogrel (PLAVIX) 75 MG tablet Take 1 tablet (75 mg total) by mouth daily. 90 tablet 4   FLUoxetine (PROZAC) 20 MG capsule Take 1 capsule (20 mg total) by mouth daily. 30 capsule 1   hydrochlorothiazide (HYDRODIURIL) 25 MG tablet TAKE 1 TABLET(25 MG) BY MOUTH DAILY 90 tablet 4   naloxone (NARCAN) nasal spray 4 mg/0.1 mL Place 4 mg into the nose as directed.     oxyCODONE (ROXICODONE) 15 MG immediate release tablet Take 1 tablet (15 mg total) by mouth 4 (four) times daily as needed for pain. 120 tablet 0   polyethylene glycol (MIRALAX / GLYCOLAX) 17 g packet Take 17 g by mouth daily as needed.     Sennosides (EX-LAX PO) Take 3-4 tablets by mouth every 3 (three) days as needed (constipation).     No current facility-administered medications on file prior to visit.    There are no Patient  Instructions on file for this visit. No follow-ups on file.   Kris Hartmann, NP

## 2022-01-18 ENCOUNTER — Other Ambulatory Visit: Payer: Self-pay | Admitting: Family Medicine

## 2022-01-18 DIAGNOSIS — G8929 Other chronic pain: Secondary | ICD-10-CM

## 2022-01-18 MED ORDER — OXYCODONE HCL 15 MG PO TABS: 15.0000 mg | ORAL_TABLET | Freq: Four times a day (QID) | ORAL | 0 refills | Status: AC | PRN

## 2022-01-18 NOTE — Telephone Encounter (Incomplete)
Medication Refill - Medication: Oxycodone15 mg  Has the patient contacted their pharmacy? No. (Agent: If no, request that the patient contact the pharmacy for the refill. If patient does not wish to contact the pharmacy document the reason why and proceed with request.) (Agent: If yes, when and what did the pharmacy advise?)  Preferred Pharmacy (with phone number or street name): [facility]  Has the patient been seen for an appointment in the last year OR does the patient have an upcoming appointment? Yes.    Agent: Please be advised that RX refills may take up to 3 business days. We ask that you follow-up with your pharmacy.

## 2022-01-20 ENCOUNTER — Telehealth (INDEPENDENT_AMBULATORY_CARE_PROVIDER_SITE_OTHER): Payer: Self-pay

## 2022-01-20 NOTE — Telephone Encounter (Signed)
Spoke with the patient's daughter and he is scheduled with Dr. Lucky Cowboy on 01/28/22 with a 6:45 [facility]. Pre-procedure instructions were discussed and will be mailed.

## 2022-01-28 ENCOUNTER — Ambulatory Visit
Admission: RE | Admit: 2022-01-28 | Discharge: 2022-01-28 | Disposition: A | Discharge status: Home / Self Care | Payer: Medicare HMO | Source: Ambulatory Visit | Attending: Vascular Surgery | Admitting: Vascular Surgery

## 2022-01-28 ENCOUNTER — Encounter: Payer: Self-pay | Admitting: Vascular Surgery

## 2022-01-28 ENCOUNTER — Other Ambulatory Visit: Payer: Self-pay

## 2022-01-28 ENCOUNTER — Encounter
Admission: RE | Disposition: A | Discharge status: Home / Self Care | Payer: Self-pay | Source: Ambulatory Visit | Attending: Vascular Surgery

## 2022-01-28 DIAGNOSIS — E785 Hyperlipidemia, unspecified: Secondary | ICD-10-CM | POA: Diagnosis not present

## 2022-01-28 DIAGNOSIS — I6523 Occlusion and stenosis of bilateral carotid arteries: Secondary | ICD-10-CM | POA: Insufficient documentation

## 2022-01-28 DIAGNOSIS — Z95828 Presence of other vascular implants and grafts: Secondary | ICD-10-CM | POA: Diagnosis not present

## 2022-01-28 DIAGNOSIS — I70229 Atherosclerosis of native arteries of extremities with rest pain, unspecified extremity: Secondary | ICD-10-CM

## 2022-01-28 DIAGNOSIS — F17210 Nicotine dependence, cigarettes, uncomplicated: Secondary | ICD-10-CM | POA: Diagnosis not present

## 2022-01-28 DIAGNOSIS — I7122 Aneurysm of the aortic arch, without rupture: Secondary | ICD-10-CM | POA: Insufficient documentation

## 2022-01-28 DIAGNOSIS — I7143 Infrarenal abdominal aortic aneurysm, without rupture: Secondary | ICD-10-CM | POA: Insufficient documentation

## 2022-01-28 DIAGNOSIS — I70223 Atherosclerosis of native arteries of extremities with rest pain, bilateral legs: Secondary | ICD-10-CM | POA: Diagnosis not present

## 2022-01-28 HISTORY — PX: LOWER EXTREMITY ANGIOGRAPHY: CATH118251

## 2022-01-28 LAB — CREATININE, SERUM
Creatinine, Ser: 1.28 mg/dL — ABNORMAL HIGH (ref 0.61–1.24)
GFR, Estimated: 58 mL/min — ABNORMAL LOW (ref >60)

## 2022-01-28 LAB — BUN: BUN: 33 mg/dL — ABNORMAL HIGH (ref 8–23)

## 2022-01-28 SURGERY — LOWER EXTREMITY ANGIOGRAPHY
Anesthesia: Moderate Sedation | Site: Leg Lower | Laterality: Right

## 2022-01-28 MED ORDER — FENTANYL CITRATE (PF) 100 MCG/2ML IJ SOLN
INTRAMUSCULAR | Status: DC | PRN
  Administered 2022-01-28 (×2): 25 ug via INTRAVENOUS
  Administered 2022-01-28: 50 ug via INTRAVENOUS

## 2022-01-28 MED ORDER — HYDRALAZINE HCL 20 MG/ML IJ SOLN
INTRAMUSCULAR | Status: AC
  Administered 2022-01-28: 20 mg via INTRAVENOUS
  Filled 2022-01-28: qty 1

## 2022-01-28 MED ORDER — CEFAZOLIN SODIUM-DEXTROSE 2-4 GM/100ML-% IV SOLN: 2.0000 g | INTRAVENOUS | Status: AC

## 2022-01-28 MED ORDER — FENTANYL CITRATE (PF) 100 MCG/2ML IJ SOLN
INTRAMUSCULAR | Status: AC
  Filled 2022-01-28: qty 2

## 2022-01-28 MED ORDER — MIDAZOLAM HCL 2 MG/2ML IJ SOLN
INTRAMUSCULAR | Status: DC | PRN
  Administered 2022-01-28 (×2): 1 mg via INTRAVENOUS
  Administered 2022-01-28: 2 mg via INTRAVENOUS

## 2022-01-28 MED ORDER — LABETALOL HCL 5 MG/ML IV SOLN
INTRAVENOUS | Status: DC | PRN
  Administered 2022-01-28: 20 mg via INTRAVENOUS

## 2022-01-28 MED ORDER — HYDRALAZINE HCL 20 MG/ML IJ SOLN: 20.0000 mg | Freq: Once | INTRAMUSCULAR | Status: AC

## 2022-01-28 MED ORDER — CEFAZOLIN SODIUM-DEXTROSE 2-4 GM/100ML-% IV SOLN
INTRAVENOUS | Status: AC
  Administered 2022-01-28: 2 g via INTRAVENOUS
  Filled 2022-01-28: qty 100

## 2022-01-28 MED ORDER — HYDROMORPHONE HCL 1 MG/ML IJ SOLN: 1.0000 mg | Freq: Once | INTRAMUSCULAR | Status: DC | PRN

## 2022-01-28 MED ORDER — SODIUM CHLORIDE 0.9 % IV SOLN: INTRAVENOUS | Status: DC

## 2022-01-28 MED ORDER — FAMOTIDINE 20 MG PO TABS: 40.0000 mg | ORAL_TABLET | Freq: Once | ORAL | Status: DC | PRN

## 2022-01-28 MED ORDER — LABETALOL HCL 5 MG/ML IV SOLN
INTRAVENOUS | Status: AC
  Filled 2022-01-28: qty 4

## 2022-01-28 MED ORDER — METHYLPREDNISOLONE SODIUM SUCC 125 MG IJ SOLR: 125.0000 mg | Freq: Once | INTRAMUSCULAR | Status: DC | PRN

## 2022-01-28 MED ORDER — IODIXANOL 320 MG/ML IV SOLN
INTRAVENOUS | Status: DC | PRN
  Administered 2022-01-28: 105 mL via INTRA_ARTERIAL

## 2022-01-28 MED ORDER — MIDAZOLAM HCL 5 MG/5ML IJ SOLN
INTRAMUSCULAR | Status: AC
  Filled 2022-01-28: qty 5

## 2022-01-28 MED ORDER — DIPHENHYDRAMINE HCL 50 MG/ML IJ SOLN: 50.0000 mg | Freq: Once | INTRAMUSCULAR | Status: DC | PRN

## 2022-01-28 MED ORDER — MIDAZOLAM HCL 2 MG/ML PO SYRP: 8.0000 mg | ORAL_SOLUTION | Freq: Once | ORAL | Status: DC | PRN

## 2022-01-28 MED ORDER — HEPARIN SODIUM (PORCINE) 1000 UNIT/ML IJ SOLN
INTRAMUSCULAR | Status: AC
  Filled 2022-01-28: qty 10

## 2022-01-28 MED ORDER — ONDANSETRON HCL 4 MG/2ML IJ SOLN: 4.0000 mg | Freq: Four times a day (QID) | INTRAMUSCULAR | Status: DC | PRN

## 2022-01-28 SURGICAL SUPPLY — 16 items
CANNULA 5F STIFF (CANNULA) IMPLANT
CATH ACCU-VU SIZ PIG 5F 70CM (CATHETERS) IMPLANT
CATH OMNI FLUSH 5F 65CM (CATHETERS) IMPLANT
DEVICE STARCLOSE SE CLOSURE (Vascular Products) IMPLANT
GLIDEWIRE ADV .035X180CM (WIRE) IMPLANT
KIT MICROPUNCTURE NIT STIFF (SHEATH) IMPLANT
NDL ENTRY 21GA 7CM ECHOTIP (NEEDLE) IMPLANT
NEEDLE ENTRY 21GA 7CM ECHOTIP (NEEDLE) ×1 IMPLANT
PACK ANGIOGRAPHY (CUSTOM PROCEDURE TRAY) ×1 IMPLANT
SHEATH BRITE TIP 4FRX11 (SHEATH) IMPLANT
SHEATH BRITE TIP 5FRX11 (SHEATH) IMPLANT
SHEATH PROBE COVER 6X72 (BAG) IMPLANT
SYR MEDRAD MARK 7 150ML (SYRINGE) IMPLANT
TUBING CONTRAST HIGH PRESS 72 (TUBING) IMPLANT
WIRE GUIDERIGHT .035X150 (WIRE) IMPLANT
WIRE NITINOL .018 (WIRE) IMPLANT

## 2022-01-28 NOTE — Interval H&P Note (Signed)
History and Physical Interval Note:  01/28/2022 7:44 AM  Justin Harrington  has presented today for surgery, with the diagnosis of RLE Angio   BARD   ASO w rest pain.  The various methods of treatment have been discussed with the patient and family. After consideration of risks, benefits and other options for treatment, the patient has consented to  Procedure(s): Lower Extremity Angiography (Right) as a surgical intervention.  The patient's history has been reviewed, patient examined, no change in status, stable for surgery.  I have reviewed the patient's chart and labs.  Questions were answered to the patient's satisfaction.     Leotis Pain

## 2022-01-28 NOTE — Op Note (Signed)
[facility]  Percutaneous Study/Intervention Procedural Note   Date of Surgery: 01/28/2022  Surgeon(s):Patrycja Mumpower    Assistants:none  Pre-operative Diagnosis: PAD with claudication and early rest pain bilateral lower extremities  Post-operative diagnosis:  Same  Procedure(s) Performed:             1.  Ultrasound guidance for vascular access left femoral artery             2.  Catheter placement into right limb of the endovascular stent graft analogous to the right common iliac artery from left femoral approach             3.  Aortogram and selective bilateral lower extremity angiogram             4.  StarClose closure device left femoral artery  EBL: 10 cc  Contrast: 105 cc  Fluoro Time: 10.6 minutes  Moderate Conscious Sedation Time: approximately 43 minutes using 4 mg of Versed and 100 mcg of Fentanyl              Indications:  Patient is a 76 y.o.male with disabling claudication symptoms and now signs of early rest pain. The patient has noninvasive study showing reduced ABI more on the right than the left. The patient is brought in for angiography for further evaluation and potential treatment.  Due to the limb threatening nature of the situation, angiogram was performed for attempted limb salvage. The patient is aware that if the procedure fails, amputation would be expected.  The patient also understands that even with successful revascularization, amputation may still be required due to the severity of the situation.  Risks and benefits are discussed and informed consent is obtained.   Procedure:  The patient was identified and appropriate procedural time out was performed.  The patient was then placed supine on the table and prepped and draped in the usual sterile fashion. Moderate conscious sedation was administered during a face to face encounter with the patient throughout the procedure with my supervision of the RN administering medicines and  monitoring the patient's vital signs, pulse oximetry, telemetry and mental status throughout from the start of the procedure until the patient was taken to the recovery room. Ultrasound was used to evaluate the left common femoral artery.  It was heavily diseased.  A digital ultrasound image was acquired.  A micropuncture needle was used to access the left common femoral artery under direct ultrasound guidance and a permanent image was performed.  A micropuncture wire and sheath were then placed.  A 0.035 advantage wire was advanced without resistance and a 5Fr sheath was placed.  Pigtail catheter was placed into the aorta and an AP aortogram was performed. This demonstrated a normal left renal artery with a patent right renal artery stent.  The previously placed Gore Excluder stent graft was patent and there was not an obvious endoleak with good flow through both limbs of the graft. I then used the V S1 catheter to hook the flow divider and put the tip of the catheter in the right limb of the Excluder stent graft analogous to the right common iliac artery and perform selective right lower extremity imaging. The right common femoral artery was heavily diseased with moderate stenosis in the 50% range with what appeared to be moderate stenosis in the 60 to 70% range in the proximal profunda femoris artery and a flush occlusion of the right SFA.  There was reconstitution at Hunter's canal.  The distal flow  was quite faint due to limited flow with a catheter so proximal and so the tibials were difficult to evaluate.  This would not be able to be treated in an endovascular fashion with the aortic stent graft in place and would likely require left femoral endarterectomy and either an SFA stent or a femoral to popliteal bypass.  I elected to go ahead and evaluate the left lower extremity as well at this time.  Imaging was performed through the 5 French sheath in the left femoral artery.  This showed a near occlusive  stenosis in the left common femoral artery just distal to the previously placed sheath.  The SFA had what appeared to be moderate stenosis in the mid to distal segment.  The popliteal artery was fairly normal and there appeared to be two-vessel runoff distally through the peroneal and anterior tibial arteries without focal stenosis.  I elected to terminate the procedure. The sheath was removed and StarClose closure device was deployed in the left femoral artery with excellent hemostatic result. The patient was taken to the recovery room in stable condition having tolerated the procedure well.  Findings:               Aortogram:  This demonstrated a normal left renal artery with a patent right renal artery stent.  The previously placed Gore Excluder stent graft was patent and there was not an obvious endoleak with good flow through both limbs of the graft             Right lower Extremity: The right common femoral artery was heavily diseased with moderate stenosis in the 50% range with what appeared to be moderate stenosis in the 60 to 70% range in the proximal profunda femoris artery and a flush occlusion of the right SFA.  There was reconstitution at Hunter's canal.  The distal flow was quite faint due to limited flow with a catheter so proximal and so the tibials were difficult to evaluate.  Left Lower Extremity: This showed a near occlusive stenosis in the left common femoral artery just distal to the previously placed sheath.  The SFA had what appeared to be moderate stenosis in the mid to distal segment.  The popliteal artery was fairly normal and there appeared to be two-vessel runoff distally through the peroneal and anterior tibial arteries without focal stenosis.   Disposition: Patient was taken to the recovery room in stable condition having tolerated the procedure well.  Complications: None  Leotis Pain 01/28/2022 10:46 AM   This note was created with Dragon Medical transcription system.  Any errors in dictation are purely unintentional.

## 2022-01-29 ENCOUNTER — Other Ambulatory Visit (INDEPENDENT_AMBULATORY_CARE_PROVIDER_SITE_OTHER): Payer: Self-pay | Admitting: Nurse Practitioner

## 2022-01-29 ENCOUNTER — Encounter: Payer: Self-pay | Admitting: Vascular Surgery

## 2022-01-29 DIAGNOSIS — I70213 Atherosclerosis of native arteries of extremities with intermittent claudication, bilateral legs: Secondary | ICD-10-CM

## 2022-02-03 ENCOUNTER — Encounter: Payer: Self-pay | Admitting: Family Medicine

## 2022-02-03 ENCOUNTER — Ambulatory Visit (INDEPENDENT_AMBULATORY_CARE_PROVIDER_SITE_OTHER): Payer: Medicare HMO

## 2022-02-03 VITALS — Ht 74.0 in | Wt 157.0 lb

## 2022-02-03 DIAGNOSIS — M5030 Other cervical disc degeneration, unspecified cervical region: Secondary | ICD-10-CM | POA: Insufficient documentation

## 2022-02-03 DIAGNOSIS — Z0000 Encounter for general adult medical examination without abnormal findings: Secondary | ICD-10-CM | POA: Diagnosis not present

## 2022-02-03 DIAGNOSIS — N529 Male erectile dysfunction, unspecified: Secondary | ICD-10-CM | POA: Insufficient documentation

## 2022-02-03 DIAGNOSIS — I739 Peripheral vascular disease, unspecified: Secondary | ICD-10-CM | POA: Insufficient documentation

## 2022-02-03 DIAGNOSIS — I749 Embolism and thrombosis of unspecified artery: Secondary | ICD-10-CM | POA: Insufficient documentation

## 2022-02-03 DIAGNOSIS — F32A Depression, unspecified: Secondary | ICD-10-CM | POA: Insufficient documentation

## 2022-02-03 DIAGNOSIS — M542 Cervicalgia: Secondary | ICD-10-CM | POA: Insufficient documentation

## 2022-02-03 DIAGNOSIS — F17200 Nicotine dependence, unspecified, uncomplicated: Secondary | ICD-10-CM | POA: Insufficient documentation

## 2022-02-03 NOTE — Patient Instructions (Signed)
Justin Harrington , Thank you for taking time to come for your Medicare Wellness Visit. I appreciate your ongoing commitment to your health goals. Please review the following plan we discussed and let me know if I can assist you in the future.   Screening recommendations/referrals: Colonoscopy: 06/20/17 Recommended yearly ophthalmology/optometry visit for glaucoma screening and checkup Recommended yearly dental visit for hygiene and checkup  Vaccinations: Influenza vaccine: N/D Pneumococcal vaccine: 05/08/13 Tdap vaccine: N/D Shingles vaccine: N/D   Covid-19: 04/04/19, 04/25/19, 01/20/20  Advanced directives: NO  Conditions/risks identified: NONE  Next appointment: Follow up in one year for your annual wellness visit. 02/07/23 @ 1 PM BY PHONE  Preventive Care 65 Years and Older, Male Preventive care refers to lifestyle choices and visits with your health care provider that can promote health and wellness. What does preventive care include? A yearly physical exam. This is also called an annual well check. Dental exams once or twice a year. Routine eye exams. Ask your health care provider how often you should have your eyes checked. Personal lifestyle choices, including: Daily care of your teeth and gums. Regular physical activity. Eating a healthy diet. Avoiding tobacco and drug use. Limiting alcohol use. Practicing safe sex. Taking low doses of aspirin every day. Taking vitamin and mineral supplements as recommended by your health care provider. What happens during an annual well check? The services and screenings done by your health care provider during your annual well check will depend on your age, overall health, lifestyle risk factors, and family history of disease. Counseling  Your health care provider may ask you questions about your: Alcohol use. Tobacco use. Drug use. Emotional well-being. Home and relationship well-being. Sexual activity. Eating habits. History of  falls. Memory and ability to understand (cognition). Work and work Statistician. Screening  You may have the following tests or measurements: Height, weight, and BMI. Blood pressure. Lipid and cholesterol levels. These may be checked every 5 years, or more frequently if you are over 75 years old. Skin check. Lung cancer screening. You may have this screening every year starting at age 1 if you have a 30-pack-year history of smoking and currently smoke or have quit within the past 15 years. Fecal occult blood test (FOBT) of the stool. You may have this test every year starting at age 78. Flexible sigmoidoscopy or colonoscopy. You may have a sigmoidoscopy every 5 years or a colonoscopy every 10 years starting at age 47. Prostate cancer screening. Recommendations will vary depending on your family history and other risks. Hepatitis C blood test. Hepatitis B blood test. Sexually transmitted disease (STD) testing. Diabetes screening. This is done by checking your blood sugar (glucose) after you have not eaten for a while (fasting). You may have this done every 1-3 years. Abdominal aortic aneurysm (AAA) screening. You may need this if you are a current or former smoker. Osteoporosis. You may be screened starting at age 60 if you are at high risk. Talk with your health care provider about your test results, treatment options, and if necessary, the need for more tests. Vaccines  Your health care provider may recommend certain vaccines, such as: Influenza vaccine. This is recommended every year. Tetanus, diphtheria, and acellular pertussis (Tdap, Td) vaccine. You may need a Td booster every 10 years. Zoster vaccine. You may need this after age 76. Pneumococcal 13-valent conjugate (PCV13) vaccine. One dose is recommended after age 34. Pneumococcal polysaccharide (PPSV23) vaccine. One dose is recommended after age 31. Talk to your health care  provider about which screenings and vaccines you need and  how often you need them. This information is not intended to replace advice given to you by your health care provider. Make sure you discuss any questions you have with your health care provider. Document Released: 02/14/2015 Document Revised: 10/08/2015 Document Reviewed: 11/19/2014 Elsevier Interactive Patient Education  2017 [place] Prevention in the Home Falls can cause injuries. They can happen to people of all ages. There are many things you can do to make your home safe and to help prevent falls. What can I do on the outside of my home? Regularly fix the edges of walkways and driveways and fix any cracks. Remove anything that might make you trip as you walk through a door, such as a raised step or threshold. Trim any bushes or trees on the path to your home. Use bright outdoor lighting. Clear any walking paths of anything that might make someone trip, such as rocks or tools. Regularly check to see if handrails are loose or broken. Make sure that both sides of any steps have handrails. Any raised decks and porches should have guardrails on the edges. Have any leaves, snow, or ice cleared regularly. Use sand or salt on walking paths during winter. Clean up any spills in your garage right away. This includes oil or grease spills. What can I do in the bathroom? Use night lights. Install grab bars by the toilet and in the tub and shower. Do not use towel bars as grab bars. Use non-skid mats or decals in the tub or shower. If you need to sit down in the shower, use a plastic, non-slip stool. Keep the floor dry. Clean up any water that spills on the floor as soon as it happens. Remove soap buildup in the tub or shower regularly. Attach bath mats securely with double-sided non-slip rug tape. Do not have throw rugs and other things on the floor that can make you trip. What can I do in the bedroom? Use night lights. Make sure that you have a light by your bed that is easy to  reach. Do not use any sheets or blankets that are too big for your bed. They should not hang down onto the floor. Have a firm chair that has side arms. You can use this for support while you get dressed. Do not have throw rugs and other things on the floor that can make you trip. What can I do in the kitchen? Clean up any spills right away. Avoid walking on wet floors. Keep items that you use a lot in easy-to-reach places. If you need to reach something above you, use a strong step stool that has a grab bar. Keep electrical cords out of the way. Do not use floor polish or wax that makes floors slippery. If you must use wax, use non-skid floor wax. Do not have throw rugs and other things on the floor that can make you trip. What can I do with my stairs? Do not leave any items on the stairs. Make sure that there are handrails on both sides of the stairs and use them. Fix handrails that are broken or loose. Make sure that handrails are as long as the stairways. Check any carpeting to make sure that it is firmly attached to the stairs. Fix any carpet that is loose or worn. Avoid having throw rugs at the top or bottom of the stairs. If you do have throw rugs, attach them to the  floor with carpet tape. Make sure that you have a light switch at the top of the stairs and the bottom of the stairs. If you do not have them, ask someone to add them for you. What else can I do to help prevent falls? Wear shoes that: Do not have high heels. Have rubber bottoms. Are comfortable and fit you well. Are closed at the toe. Do not wear sandals. If you use a stepladder: Make sure that it is fully opened. Do not climb a closed stepladder. Make sure that both sides of the stepladder are locked into place. Ask someone to hold it for you, if possible. Clearly mark and make sure that you can see: Any grab bars or handrails. First and last steps. Where the edge of each step is. Use tools that help you move  around (mobility aids) if they are needed. These include: Canes. Walkers. Scooters. Crutches. Turn on the lights when you go into a dark area. Replace any light bulbs as soon as they burn out. Set up your furniture so you have a clear path. Avoid moving your furniture around. If any of your floors are uneven, fix them. If there are any pets around you, be aware of where they are. Review your medicines with your doctor. Some medicines can make you feel dizzy. This can increase your chance of falling. Ask your doctor what other things that you can do to help prevent falls. This information is not intended to replace advice given to you by your health care provider. Make sure you discuss any questions you have with your health care provider. Document Released: 11/14/2008 Document Revised: 06/26/2015 Document Reviewed: 02/22/2014 Elsevier Interactive Patient Education  2017 [employer].

## 2022-02-03 NOTE — Progress Notes (Signed)
Virtual Visit via Telephone Note  I connected with  Justin Harrington on 02/03/22 at  3:15 PM EST by telephone and verified that I am speaking with the correct person using two identifiers.  Location: Patient: HOME Provider: [facility] Persons participating in the virtual visit: patient/Nurse Health Advisor   I discussed the limitations, risks, security and privacy concerns of performing an evaluation and management service by telephone and the availability of in person appointments. The patient expressed understanding and agreed to proceed.  Interactive audio and video telecommunications were attempted between this nurse and patient, however failed, due to patient having technical difficulties OR patient did not have access to video capability.  We continued and completed visit with audio only.  Some vital signs may be absent or patient reported.   [name], LPN  Subjective:   Justin Harrington is a 77 y.o. male who presents for Medicare Annual/Subsequent preventive examination.  Review of Systems           Objective:    Today's Vitals   02/03/22 1520  PainSc: 5    There is no height or weight on file to calculate BMI.     01/28/2022    7:47 AM 11/03/2021    7:58 AM 11/02/2021    5:54 PM 01/28/2021    3:43 PM 01/28/2020    1:59 PM 12/12/2019    4:21 PM 07/04/2018   10:37 AM  Advanced Directives  Does Patient Have a Medical Advance Directive? Yes No No No Yes No Yes  Type of Advance Directive Living will;Healthcare Power of [place];Living will  [place];Living will  Copy of [place] in Chart? No - copy requested    No - copy requested  No - copy requested  Would patient like information on creating a medical advance directive?  No - Patient declined No - Patient declined No - Patient declined       Current Medications (verified) Outpatient Encounter Medications as of 02/03/2022  Medication Sig    amLODipine (NORVASC) 2.5 MG tablet Take 1 tablet (2.5 mg total) by mouth daily.   aspirin EC 81 MG tablet Take 81 mg by mouth daily. (Patient not taking: Reported on 01/28/2022)   atorvastatin (LIPITOR) 80 MG tablet Take 1 tablet (80 mg total) by mouth daily.   clopidogrel (PLAVIX) 75 MG tablet Take 1 tablet (75 mg total) by mouth daily.   FLUoxetine (PROZAC) 20 MG capsule Take 1 capsule (20 mg total) by mouth daily.   hydrochlorothiazide (HYDRODIURIL) 25 MG tablet TAKE 1 TABLET(25 MG) BY MOUTH DAILY   naloxone (NARCAN) nasal spray 4 mg/0.1 mL Place 4 mg into the nose as directed.   oxyCODONE (ROXICODONE) 15 MG immediate release tablet Take 1 tablet (15 mg total) by mouth 4 (four) times daily as needed for pain.   polyethylene glycol (MIRALAX / GLYCOLAX) 17 g packet Take 17 g by mouth daily as needed.   Sennosides (EX-LAX PO) Take 3-4 tablets by mouth every 3 (three) days as needed (constipation).   No facility-administered encounter medications on file as of 02/03/2022.    Allergies (verified) Zolpidem   History: Past Medical History:  Diagnosis Date   Aortic aneurysm (HCC)    Coronary artery disease    DVT (deep venous thrombosis) (HCC)    H/O DVT in left leg, filter placed approximatly 8 yrs ago   H/O spinal fusion 1998   Back pain  Hypertension    controlled on meds   Personal history of tobacco use, presenting hazards to health 06/12/2015   Personal history of tobacco use, presenting hazards to health 06/12/2015   Prostate cancer [facility])    Had prostatectomy   Wears dentures    upper   Past Surgical History:  Procedure Laterality Date   BACK SURGERY  04/30/1996   Lumbar spine fusion   carotid doppler ultrasound  10/05/2011   75-90% RCA occlusion, 50% on left   CAROTID ENDARTERECTOMY Right 12/02/2011   Dr. Lucky Cowboy   CHOLECYSTECTOMY  10/02/2010   Laparoscopic, Dr. Pat Patrick, [place] WITH PROPOFOL N/A 06/20/2017   Procedure: COLONOSCOPY WITH PROPOFOL;  Surgeon: [name],  MD;  Location: [facility];  Service: Endoscopy;  Laterality: N/A;   CT SCAN  10/02/2010   [facility]; Infrarenal suprailiac abdominal aortic aneurysm maximum dimention of nearly 4cm. Cholelithiasis and acute cholecystiis. Mild enlargement of left adrenal gland   ENDOVASCULAR REPAIR/STENT GRAFT N/A 03/29/2018   Procedure: ENDOVASCULAR REPAIR/STENT GRAFT;  Surgeon: Algernon Huxley, MD;  Location: [facility];  Service: Cardiovascular;  Laterality: N/A;   LOWER EXTREMITY ANGIOGRAPHY Right 01/28/2022   Procedure: Lower Extremity Angiography;  Surgeon: Algernon Huxley, MD;  Location: [place];  Service: Cardiovascular;  Laterality: Right;   LUMBAR SPINE HARDWARE REMOVAL  07/30/2016   [name], MD   PROSTATE SURGERY  10/19/2012   prostatectomy. [facility]; Laser assisted, Done by [name] for prostate cancer   Family History  Problem Relation Age of Onset   Heart attack Mother    Congestive Heart Failure Father    Cerebral palsy Son    Social History   Socioeconomic History   Marital status: Widowed    Spouse name: Not on file   Number of children: 2   Years of education: HS Diploma   Highest education level: 12th grade  Occupational History   Occupation: Retired  Tobacco Use   Smoking status: Every Day    Packs/day: 0.50    Years: 50.00    Total pack years: 25.00    Types: Cigarettes   Smokeless tobacco: Never  Vaping Use   Vaping Use: Never used  Substance and Sexual Activity   Alcohol use: No    Alcohol/week: 0.0 standard drinks of alcohol   Drug use: No   Sexual activity: Not on file  Other Topics Concern   Not on file  Social History Narrative   Son passed at age 44 from cerebral palsy.   Wife passed away in 30-Apr-2021 from stomach cancer   Patient move to [place] Summer 2023   Social Determinants of Health   Financial Resource Strain: Low Risk  (01/28/2021)   Overall Financial Resource Strain (CARDIA)    Difficulty of Paying  Living Expenses: Not hard at all  Food Insecurity: No Food Insecurity (11/03/2021)   Hunger Vital Sign    Worried About Running Out of Food in the Last Year: Never true    Ran Out of Food in the Last Year: Never true  Transportation Needs: Unmet Transportation Needs (11/03/2021)   PRAPARE - Hydrologist (Medical): Yes    Lack of Transportation (Non-Medical): Yes  Physical Activity: Inactive (01/28/2021)   Exercise Vital Sign    Days of Exercise per Week: 0 days    Minutes of Exercise per Session: 0 min  Stress: No Stress Concern Present (01/28/2021)   [employer]  of Occupational Health - Occupational Stress Questionnaire    Feeling of Stress : Not at all  Social Connections: Moderately Integrated (01/28/2021)   Social Connection and Isolation Panel [NHANES]    Frequency of Communication with Friends and Family: Three times a week    Frequency of Social Gatherings with Friends and Family: More than three times a week    Attends Religious Services: More than 4 times per year    Active Member of [employer] or Organizations: No    Attends Music therapist: Never    Marital Status: Married    Tobacco Counseling Ready to quit: Not Answered Counseling given: Not Answered   Clinical Intake:  Pre-visit preparation completed: Yes  Pain : 0-10 Pain Score: 5  Pain Type: Chronic pain Pain Location: Back     Nutritional Risks: None Diabetes: No  How often do you need to have someone help you when you read instructions, pamphlets, or other written materials from your doctor or pharmacy?: 1 - Never  Diabetic?NO  Interpreter Needed?: No  Information entered by :: Kirke Shaggy, LPN   Activities of Daily Living    01/28/2022    7:39 AM 11/09/2021    1:38 PM  In your present state of health, do you have any difficulty performing the following activities:  Hearing? 0 1  Vision? 1 1  Difficulty concentrating or making decisions? 1 1   Walking or climbing stairs? 0 1  Dressing or bathing? 0 0  Doing errands, shopping?  1    Patient Care Team: [name], MD as PCP - General (Family Medicine) Lucky Cowboy Erskine Squibb, MD as Referring Physician (Vascular Surgery) [place], [place] as Consulting Physician (Optometry) Ubaldo Glassing, Javier Docker, MD as Consulting Physician (Cardiology)  Indicate any recent Medical Services you may have received from other than [facility] providers in the past year (date may be approximate).     Assessment:   This is a routine wellness examination for [place].  Hearing/Vision screen No results found.  Dietary issues and exercise activities discussed:     Goals Addressed   None    Depression Screen    11/09/2021    1:38 PM 01/28/2021    3:42 PM 11/27/2019   10:46 AM 07/04/2018   10:40 AM 07/04/2018   10:38 AM 04/26/2017    9:15 AM 06/07/2016   12:05 PM  PHQ 2/9 Scores  PHQ - 2 Score 2 0 0 0 0 0 0  PHQ- 9 Score '11  2 2       '$ Fall Risk    11/09/2021    1:38 PM 01/28/2021    3:44 PM 01/28/2020    1:59 PM 11/27/2019   10:55 AM 08/28/2019    2:09 PM  [place] in the past year? 1 0 1 1 0  Comment     Emmi Telephone Survey: data to providers prior to load  Number falls in past yr: 1 0 0 0   Injury with Fall? 0 0 0 0   Risk for fall due to :  No Fall Risks     Follow up  Falls evaluation completed Falls prevention discussed      [place]:  Any stairs in or around the home? No  If so, are there any without handrails? No  Home free of loose throw rugs in walkways, pet beds, electrical cords, etc? Yes  Adequate lighting in your home to reduce risk  of falls? Yes   ASSISTIVE DEVICES UTILIZED TO PREVENT FALLS:  Life alert? No  Use of a cane, walker or w/c? Yes  Grab bars in the bathroom? Yes  Shower chair or bench in shower? Yes  Elevated toilet seat or a handicapped toilet? Yes   Cognitive Function:DECLINED TEST 2024         Immunizations Immunization History  Administered Date(s) Administered   Influenza, High Dose Seasonal PF 10/12/2016, 11/03/2017, 09/18/2018, 11/20/2019   Influenza-Unspecified 12/01/2014   PFIZER Comirnaty(Gray Top)Covid-19 Tri-Sucrose Vaccine 04/04/2019, 04/25/2019, 01/20/2020   Pneumococcal Conjugate-13 05/08/2013   Pneumococcal Polysaccharide-23 10/02/2010    TDAP status: Due, Education has been provided regarding the importance of this vaccine. Advised may receive this vaccine at local pharmacy or Health Dept. Aware to provide a copy of the vaccination record if obtained from local pharmacy or Health Dept. Verbalized acceptance and understanding.  Flu Vaccine status: Up to date  Pneumococcal vaccine status: Up to date  Covid-19 vaccine status: Completed vaccines  Qualifies for Shingles Vaccine? Yes   Zostavax completed No   Shingrix Completed?: No.    Education has been provided regarding the importance of this vaccine. Patient has been advised to call insurance company to determine out of pocket expense if they have not yet received this vaccine. Advised may also receive vaccine at local pharmacy or Health Dept. Verbalized acceptance and understanding.  Screening Tests Health Maintenance  Topic Date Due   DTaP/Tdap/Td (1 - Tdap) Never done   Zoster Vaccines- Shingrix (1 of 2) Never done   Pneumonia Vaccine 24+ Years old (3 - PPSV23 or PCV20) 10/02/2015   Lung Cancer Screening  08/19/2020   INFLUENZA VACCINE  09/01/2021   COVID-19 Vaccine (4 - 2023-24 season) 10/02/2021   Medicare Annual Wellness (AWV)  02/04/2023   COLONOSCOPY (Pts 45-57yr Insurance coverage will need to be confirmed)  06/21/2027   Hepatitis C Screening  Completed   HPV VACCINES  Aged Out   Fecal DNA (Cologuard)  Discontinued    Health Maintenance  Health Maintenance Due  Topic Date Due   DTaP/Tdap/Td (1 - Tdap) Never done   Zoster Vaccines- Shingrix (1 of 2) Never done   Pneumonia Vaccine 76  Years old (3 - PPSV23 or PCV20) 10/02/2015   Lung Cancer Screening  08/19/2020   INFLUENZA VACCINE  09/01/2021   COVID-19 Vaccine (4 - 2023-24 season) 10/02/2021    Colorectal cancer screening: Type of screening: Colonoscopy. Completed 06/20/17. Repeat every 10 years  Lung Cancer Screening: (Low Dose CT Chest recommended if Age 76-80years, 30 pack-year currently smoking OR have quit w/in 15years.) does not qualify.    Additional Screening:  Hepatitis C Screening: does qualify; Completed 03/15/16  Vision Screening: Recommended annual ophthalmology exams for early detection of glaucoma and other disorders of the eye. Is the patient up to date with their annual eye exam?  Yes  Who is the provider or what is the name of the office in which the patient attends annual eye exams? [email or web address] If pt is not established with a provider, would they like to be referred to a provider to establish care? No .   Dental Screening: Recommended annual dental exams for proper oral hygiene  Community Resource Referral / Chronic Care Management: CRR required this visit?  No   CCM required this visit?  No      Plan:     I have personally reviewed and noted the following in the patient's chart:   Medical and social  history Use of alcohol, tobacco or illicit drugs  Current medications and supplements including opioid prescriptions. Patient is currently taking opioid prescriptions. Information provided to patient regarding non-opioid alternatives. Patient advised to discuss non-opioid treatment plan with their provider. Functional ability and status Nutritional status Physical activity Advanced directives List of other physicians Hospitalizations, surgeries, and ER visits in previous 12 months Vitals Screenings to include cognitive, depression, and falls Referrals and appointments  In addition, I have reviewed and discussed with patient certain preventive protocols, quality metrics, and best practice  recommendations. A written personalized care plan for preventive services as well as general preventive health recommendations were provided to patient.     [name], LPN   04/05/2874   Nurse Notes: Marlynn Perking

## 2022-02-05 ENCOUNTER — Other Ambulatory Visit: Payer: Self-pay | Admitting: Family Medicine

## 2022-02-05 DIAGNOSIS — G8929 Other chronic pain: Secondary | ICD-10-CM

## 2022-02-07 ENCOUNTER — Emergency Department
Admission: EM | Admit: 2022-02-07 | Discharge: 2022-02-07 | Disposition: A | Discharge status: Home / Self Care | Payer: Medicare HMO | Attending: Emergency Medicine | Admitting: Emergency Medicine

## 2022-02-07 ENCOUNTER — Other Ambulatory Visit: Payer: Self-pay

## 2022-02-07 ENCOUNTER — Emergency Department: Payer: Medicare HMO

## 2022-02-07 DIAGNOSIS — T402X2A Poisoning by other opioids, intentional self-harm, initial encounter: Secondary | ICD-10-CM | POA: Diagnosis not present

## 2022-02-07 DIAGNOSIS — T50902A Poisoning by unspecified drugs, medicaments and biological substances, intentional self-harm, initial encounter: Secondary | ICD-10-CM

## 2022-02-07 DIAGNOSIS — Z8679 Personal history of other diseases of the circulatory system: Secondary | ICD-10-CM | POA: Insufficient documentation

## 2022-02-07 DIAGNOSIS — S0990XA Unspecified injury of head, initial encounter: Secondary | ICD-10-CM | POA: Diagnosis not present

## 2022-02-07 DIAGNOSIS — I10 Essential (primary) hypertension: Secondary | ICD-10-CM | POA: Insufficient documentation

## 2022-02-07 DIAGNOSIS — T50904A Poisoning by unspecified drugs, medicaments and biological substances, undetermined, initial encounter: Secondary | ICD-10-CM | POA: Diagnosis not present

## 2022-02-07 DIAGNOSIS — F1110 Opioid abuse, uncomplicated: Secondary | ICD-10-CM | POA: Diagnosis not present

## 2022-02-07 DIAGNOSIS — I63531 Cerebral infarction due to unspecified occlusion or stenosis of right posterior cerebral artery: Secondary | ICD-10-CM | POA: Diagnosis not present

## 2022-02-07 DIAGNOSIS — T402X1A Poisoning by other opioids, accidental (unintentional), initial encounter: Secondary | ICD-10-CM | POA: Diagnosis not present

## 2022-02-07 DIAGNOSIS — T887XXA Unspecified adverse effect of drug or medicament, initial encounter: Secondary | ICD-10-CM | POA: Diagnosis not present

## 2022-02-07 DIAGNOSIS — F19939 Other psychoactive substance use, unspecified with withdrawal, unspecified: Secondary | ICD-10-CM | POA: Diagnosis not present

## 2022-02-07 DIAGNOSIS — R0902 Hypoxemia: Secondary | ICD-10-CM | POA: Diagnosis not present

## 2022-02-07 LAB — URINE DRUG SCREEN, QUALITATIVE (ARMC ONLY)
Amphetamines, Ur Screen: NOT DETECTED
Barbiturates, Ur Screen: NOT DETECTED
Benzodiazepine, Ur Scrn: NOT DETECTED
Cannabinoid 50 Ng, Ur ~~LOC~~: NOT DETECTED
Cocaine Metabolite,Ur ~~LOC~~: NOT DETECTED
MDMA (Ecstasy)Ur Screen: NOT DETECTED
Methadone Scn, Ur: NOT DETECTED
Opiate, Ur Screen: POSITIVE — AB
Phencyclidine (PCP) Ur S: NOT DETECTED
Tricyclic, Ur Screen: NOT DETECTED

## 2022-02-07 LAB — URINALYSIS, ROUTINE W REFLEX MICROSCOPIC
Bilirubin Urine: NEGATIVE
Glucose, UA: NEGATIVE mg/dL
Hgb urine dipstick: NEGATIVE
Ketones, ur: NEGATIVE mg/dL
Leukocytes,Ua: NEGATIVE
Nitrite: NEGATIVE
Protein, ur: NEGATIVE mg/dL
Specific Gravity, Urine: 1.024 (ref 1.005–1.030)
pH: 5 (ref 5.0–8.0)

## 2022-02-07 LAB — CBC
HCT: 40.4 % (ref 39.0–52.0)
Hemoglobin: 13.9 g/dL (ref 13.0–17.0)
MCH: 31.7 pg (ref 26.0–34.0)
MCHC: 34.4 g/dL (ref 30.0–36.0)
MCV: 92.2 fL (ref 80.0–100.0)
Platelets: 258 10*3/uL (ref 150–400)
RBC: 4.38 MIL/uL (ref 4.22–5.81)
RDW: 13.5 % (ref 11.5–15.5)
WBC: 13.2 10*3/uL — ABNORMAL HIGH (ref 4.0–10.5)
nRBC: 0 % (ref 0.0–0.2)

## 2022-02-07 LAB — COMPREHENSIVE METABOLIC PANEL
ALT: 19 U/L (ref 0–44)
AST: 30 U/L (ref 15–41)
Albumin: 4.1 g/dL (ref 3.5–5.0)
Alkaline Phosphatase: 50 U/L (ref 38–126)
Anion gap: 16 — ABNORMAL HIGH (ref 5–15)
BUN: 31 mg/dL — ABNORMAL HIGH (ref 8–23)
CO2: 21 mmol/L — ABNORMAL LOW (ref 22–32)
Calcium: 9.4 mg/dL (ref 8.9–10.3)
Chloride: 96 mmol/L — ABNORMAL LOW (ref 98–111)
Creatinine, Ser: 1.64 mg/dL — ABNORMAL HIGH (ref 0.61–1.24)
GFR, Estimated: 43 mL/min — ABNORMAL LOW (ref >60)
Glucose, Bld: 115 mg/dL — ABNORMAL HIGH (ref 70–99)
Potassium: 3.3 mmol/L — ABNORMAL LOW (ref 3.5–5.1)
Sodium: 133 mmol/L — ABNORMAL LOW (ref 135–145)
Total Bilirubin: 1.1 mg/dL (ref 0.3–1.2)
Total Protein: 7.4 g/dL (ref 6.5–8.1)

## 2022-02-07 LAB — ETHANOL: Alcohol, Ethyl (B): <10 mg/dL (ref <10)

## 2022-02-07 LAB — SALICYLATE LEVEL: Salicylate Lvl: <7 mg/dL — ABNORMAL LOW (ref 7.0–30.0)

## 2022-02-07 LAB — ACETAMINOPHEN LEVEL
Acetaminophen (Tylenol), Serum: 78 ug/mL — ABNORMAL HIGH (ref 10–30)
Acetaminophen (Tylenol), Serum: 38 ug/mL — ABNORMAL HIGH (ref 10–30)

## 2022-02-07 MED ORDER — HYDROXYZINE HCL 25 MG PO TABS
25.0000 mg | ORAL_TABLET | Freq: Once | ORAL | Status: AC
  Administered 2022-02-07: 25 mg via ORAL
  Filled 2022-02-07: qty 1

## 2022-02-07 NOTE — ED Triage Notes (Signed)
Pt states taking more pain medication that he was supposed to for his back pain. Please see first RN note. Pt states he took the pain medications a couple hours ago. Pt states concern for withdrawals.  Pt states he is on a blood thinner, hit his head but did not have loss of consciousness. Pt states falling 3 hours ago. Pt states not being on his blood thinner medications for 2-3 weeks because he ran out.

## 2022-02-07 NOTE — Discharge Instructions (Addendum)
Pain control:  Ibuprofen (motrin/aleve/advil) - You can take 3-4 tablets (600-800 mg) every 6 hours as needed for pain/fever.  Acetaminophen (tylenol) - You can take 2 extra strength tablets (1000 mg) every 6 hours as needed for pain/fever.  You can alternate these medications or take them together.  Make sure you eat food/drink water when taking these medications.

## 2022-02-07 NOTE — ED Notes (Signed)
Patient is vol pending TOC placement 

## 2022-02-07 NOTE — ED Provider Notes (Signed)
[facility] Provider Note    Event Date/Time   First MD Initiated Contact with Patient 02/07/22 [id number]     (approximate)   History   Drug Overdose   HPI  Justin Harrington is a 77 y.o. male past medical history significant for opioid abuse, hypertension, prior CVA who presents to the emergency department for concern of an overdose.  Per report patient had taken multiple extra doses of his home oxycodone and also took another person's oxycodone.  States that he has chronic back pain.  Patient asking for pain medication.  States that he feels itchy.  He is concerned that he is withdrawing from pain medication.     Physical Exam   Triage Vital Signs: ED Triage Vitals  Enc Vitals Group     BP 02/07/22 0854 (!) 164/114     Pulse Rate 02/07/22 0854 (!) 111     Resp 02/07/22 0854 20     Temp 02/07/22 0854 98.2 F (36.8 C)     Temp Source 02/07/22 0854 Oral     SpO2 02/07/22 0854 97 %     Weight 02/07/22 0855 175 lb (79.4 kg)     Height --      Head Circumference --      Peak Flow --      Pain Score 02/07/22 0854 9     Pain Loc --      Pain Edu? --      Excl. in [place]? --     Most recent vital signs: Vitals:   02/07/22 0854 02/07/22 1311  BP: (!) 164/114 (!) 195/104  Pulse: (!) 111 (!) 105  Resp: 20 18  Temp: 98.2 F (36.8 C)   SpO2: 97% 97%    Physical Exam Constitutional:      Appearance: He is well-developed.     Comments: Pacing the room, appears disheveled  HENT:     Head: Atraumatic.  Eyes:     Conjunctiva/sclera: Conjunctivae normal.  Cardiovascular:     Rate and Rhythm: Regular rhythm.  Pulmonary:     Effort: No respiratory distress.  Musculoskeletal:     Cervical back: Normal range of motion.  Skin:    General: Skin is warm.  Neurological:     Mental Status: He is alert. Mental status is at baseline.      IMPRESSION / MDM / ASSESSMENT AND PLAN / ED COURSE  I reviewed the triage vital signs and the nursing  notes.  Patient presents to the emergency department after an intentional overdose of narcotic pain medication, no suicide attempt.  Initial Tylenol level mildly elevated so we will obtain a 4-hour Tylenol level.  Differential diagnosis including overdose, withdrawal, alcohol use, electrolyte abnormality  No other significant electrolyte abnormalities.   RADIOLOGY I independently reviewed imaging, my interpretation of imaging: CT scan of the head shows no signs of intracranial hemorrhage or infarction did demonstrate prior CVA   Labs (all labs ordered are listed, but only abnormal results are displayed) Labs interpreted as -   UDS positive for opioids Labs Reviewed  COMPREHENSIVE METABOLIC PANEL - Abnormal; Notable for the following components:      Result Value   Sodium 133 (*)    Potassium 3.3 (*)    Chloride 96 (*)    CO2 21 (*)    Glucose, Bld 115 (*)    BUN 31 (*)    Creatinine, Ser 1.64 (*)    GFR, Estimated 43 (*)  Anion gap 16 (*)    All other components within normal limits  SALICYLATE LEVEL - Abnormal; Notable for the following components:   Salicylate Lvl <4.1 (*)    All other components within normal limits  ACETAMINOPHEN LEVEL - Abnormal; Notable for the following components:   Acetaminophen (Tylenol), Serum 78 (*)    All other components within normal limits  CBC - Abnormal; Notable for the following components:   WBC 13.2 (*)    All other components within normal limits  URINE DRUG SCREEN, QUALITATIVE ([facility] ONLY) - Abnormal; Notable for the following components:   Opiate, Ur Screen POSITIVE (*)    All other components within normal limits  URINALYSIS, ROUTINE W REFLEX MICROSCOPIC - Abnormal; Notable for the following components:   Color, Urine YELLOW (*)    APPearance CLEAR (*)    All other components within normal limits  ACETAMINOPHEN LEVEL - Abnormal; Notable for the following components:   Acetaminophen (Tylenol), Serum 38 (*)    All other  components within normal limits  ETHANOL    Discussed with Stanton Kidney who is his daughter over the phone.  Stanton Kidney states that he has a long history of opioid addiction and frequently misses uses his own narcotic pain medication.  States that he took too many of his opioid pain medications and said that he was going to come to the emergency department in order to get more opioid pain medication.  She states that a family friend can come pick him up.  He will follow-up with his primary care physician when she states that she is already talked to him.  Does state that he has Narcan.  4-hour Tylenol level downtrending, no concern for acetaminophen overdose.     PROCEDURES:  Critical Care performed: No  Procedures  Patient's presentation is most consistent with acute presentation with potential threat to life or bodily function.   MEDICATIONS ORDERED IN ED: Medications  hydrOXYzine (ATARAX) tablet 25 mg (25 mg Oral Given 02/07/22 1132)    FINAL CLINICAL IMPRESSION(S) / ED DIAGNOSES   Final diagnoses:  Intentional drug overdose, initial encounter ([place])  Opioid abuse ([place])     Rx / DC Orders   ED Discharge Orders     None        Note:  This document was prepared using Dragon voice recognition software and may include unintentional dictation errors.   Nathaniel Man, MD 02/07/22 1513

## 2022-02-07 NOTE — ED Triage Notes (Signed)
Pt in via EMS from [facility] with c/o accidental overdose. EMS reports pt with chronic back pain and pt reports he took 3 Hydrocodones of someone's else's and 3 Oxy of his own about 30 minutes ago. 193/91, 104 HR, 100% RA

## 2022-02-11 ENCOUNTER — Ambulatory Visit (INDEPENDENT_AMBULATORY_CARE_PROVIDER_SITE_OTHER): Payer: Medicare HMO | Admitting: Nurse Practitioner

## 2022-02-16 ENCOUNTER — Other Ambulatory Visit: Payer: Self-pay | Admitting: Family Medicine

## 2022-02-16 DIAGNOSIS — G8929 Other chronic pain: Secondary | ICD-10-CM

## 2022-02-17 ENCOUNTER — Telehealth: Payer: Self-pay | Admitting: Family Medicine

## 2022-02-17 DIAGNOSIS — M1380 Other specified arthritis, unspecified site: Secondary | ICD-10-CM | POA: Diagnosis not present

## 2022-02-17 DIAGNOSIS — R2689 Other abnormalities of gait and mobility: Secondary | ICD-10-CM | POA: Diagnosis not present

## 2022-02-17 DIAGNOSIS — R2681 Unsteadiness on feet: Secondary | ICD-10-CM | POA: Diagnosis not present

## 2022-02-17 NOTE — Telephone Encounter (Signed)
Pts daughter called and stated that pt is taking oxycodone too often and she would like to speak with provider about placebo pills or rehab/ please advise

## 2022-02-18 ENCOUNTER — Telehealth: Payer: Self-pay

## 2022-02-18 DIAGNOSIS — M1380 Other specified arthritis, unspecified site: Secondary | ICD-10-CM | POA: Diagnosis not present

## 2022-02-18 DIAGNOSIS — R2689 Other abnormalities of gait and mobility: Secondary | ICD-10-CM | POA: Diagnosis not present

## 2022-02-18 DIAGNOSIS — R2681 Unsteadiness on feet: Secondary | ICD-10-CM | POA: Diagnosis not present

## 2022-02-18 NOTE — Telephone Encounter (Signed)
     Patient  visit on 1/7  at [place]    Have you been able to follow up with your primary care physician? Yes   The patient was or was not able to obtain any needed medicine or equipment. Yes   Are there diet recommendations that you are having difficulty following? Na   Patient expresses understanding of discharge instructions and education provided has no other needs at this time.  Yes      Davanna He Pop [facility] Care Guide, [place], Care Management  [phone] [address], [place], [place] [address] Phone: [phone] Email: [email or web address]    

## 2022-02-19 DIAGNOSIS — R41841 Cognitive communication deficit: Secondary | ICD-10-CM | POA: Diagnosis not present

## 2022-02-19 DIAGNOSIS — R4789 Other speech disturbances: Secondary | ICD-10-CM | POA: Diagnosis not present

## 2022-02-19 DIAGNOSIS — R488 Other symbolic dysfunctions: Secondary | ICD-10-CM | POA: Diagnosis not present

## 2022-02-19 DIAGNOSIS — M1380 Other specified arthritis, unspecified site: Secondary | ICD-10-CM | POA: Diagnosis not present

## 2022-02-19 DIAGNOSIS — M6259 Muscle wasting and atrophy, not elsewhere classified, multiple sites: Secondary | ICD-10-CM | POA: Diagnosis not present

## 2022-02-22 DIAGNOSIS — R41841 Cognitive communication deficit: Secondary | ICD-10-CM | POA: Diagnosis not present

## 2022-02-22 DIAGNOSIS — R4789 Other speech disturbances: Secondary | ICD-10-CM | POA: Diagnosis not present

## 2022-02-22 DIAGNOSIS — R488 Other symbolic dysfunctions: Secondary | ICD-10-CM | POA: Diagnosis not present

## 2022-02-23 ENCOUNTER — Ambulatory Visit (INDEPENDENT_AMBULATORY_CARE_PROVIDER_SITE_OTHER): Payer: Medicare HMO | Admitting: Nurse Practitioner

## 2022-02-23 ENCOUNTER — Encounter (INDEPENDENT_AMBULATORY_CARE_PROVIDER_SITE_OTHER): Payer: Self-pay | Admitting: Nurse Practitioner

## 2022-02-23 VITALS — BP 114/73 | HR 90 | Resp 16 | Wt 163.8 lb

## 2022-02-23 DIAGNOSIS — I10 Essential (primary) hypertension: Secondary | ICD-10-CM

## 2022-02-23 DIAGNOSIS — E785 Hyperlipidemia, unspecified: Secondary | ICD-10-CM

## 2022-02-23 DIAGNOSIS — M1380 Other specified arthritis, unspecified site: Secondary | ICD-10-CM | POA: Diagnosis not present

## 2022-02-23 DIAGNOSIS — I70223 Atherosclerosis of native arteries of extremities with rest pain, bilateral legs: Secondary | ICD-10-CM

## 2022-02-23 DIAGNOSIS — R2681 Unsteadiness on feet: Secondary | ICD-10-CM | POA: Diagnosis not present

## 2022-02-23 DIAGNOSIS — R2689 Other abnormalities of gait and mobility: Secondary | ICD-10-CM | POA: Diagnosis not present

## 2022-02-24 ENCOUNTER — Encounter (INDEPENDENT_AMBULATORY_CARE_PROVIDER_SITE_OTHER): Payer: Self-pay | Admitting: Nurse Practitioner

## 2022-02-24 DIAGNOSIS — R488 Other symbolic dysfunctions: Secondary | ICD-10-CM | POA: Diagnosis not present

## 2022-02-24 DIAGNOSIS — R4789 Other speech disturbances: Secondary | ICD-10-CM | POA: Diagnosis not present

## 2022-02-24 DIAGNOSIS — R41841 Cognitive communication deficit: Secondary | ICD-10-CM | POA: Diagnosis not present

## 2022-02-24 NOTE — Progress Notes (Signed)
Subjective:    Patient ID: Justin Harrington, male    DOB: 1945/12/30, 77 y.o.   MRN: [id number] Chief Complaint  Patient presents with   Follow-up    2 week discuss procedure    The patient returns to the office for followup and review status post angiogram with intervention on 01/28/2022.   Procedure: Procedure(s) Performed:             1.  Ultrasound guidance for vascular access left femoral artery             2.  Catheter placement into right limb of the endovascular stent graft analogous to the right common iliac artery from left femoral approach             3.  Aortogram and selective bilateral lower extremity angiogram             4.  StarClose closure device left femoral artery   The angiogram was essentially diagnostic.  The patient had a normal left renal artery with a patent right renal artery stent.  The previous stent graft for his abdominal aortic aneurysm is patent with no obvious endoleak good flow through both limbs of the graft.  The patient had a heavily diseased common femoral artery with disease in the 50% range as well as a moderate stenosis of 60 to 70% in the proximal profunda femoris artery and a flush occlusion of the right SFA.  Left lower extremity showed near occlusive stenosis in the left common femoral artery distal to the previously placed sheath.  The SFA had moderate stenosis noted.  Following these images it was felt that the patient's disease would be better treated with an open surgical approach versus endovascular.  Based on these images it is recommended that the patient have a bilateral femoral endarterectomy with stent placement to the right SFA.   The patient notes improvement in the lower extremity symptoms.  The patient does have claudication with mild rest pain.  No new ulcers or wounds have occurred since the last visit.  There have been no significant changes to the patient's overall health care.  No documented history of amaurosis fugax or  recent TIA symptoms. There are no recent neurological changes noted. No documented history of DVT, PE or superficial thrombophlebitis. The patient denies recent episodes of angina or shortness of breath.      Review of Systems  Musculoskeletal:  Positive for arthralgias and gait problem.  All other systems reviewed and are negative.      Objective:   Physical Exam Vitals reviewed.  HENT:     Head: Normocephalic.  Cardiovascular:     Rate and Rhythm: Normal rate.     Pulses:          Dorsalis pedis pulses are detected w/ Doppler on the right side and detected w/ Doppler on the left side.       Posterior tibial pulses are detected w/ Doppler on the right side and detected w/ Doppler on the left side.  Pulmonary:     Effort: Pulmonary effort is normal.  Skin:    General: Skin is warm and dry.  Neurological:     Mental Status: He is alert and oriented to person, place, and time.     Motor: Weakness present.     Gait: Gait abnormal.  Psychiatric:        Mood and Affect: Mood normal.        Behavior: Behavior normal.  Thought Content: Thought content normal.        Judgment: Judgment normal.     BP 114/73 (BP Location: Right Arm)   Pulse 90   Resp 16   Wt 163 lb 12.8 oz (74.3 kg)   BMI 21.03 kg/m   Past Medical History:  Diagnosis Date   Aortic aneurysm (HCC)    Coronary artery disease    DVT (deep venous thrombosis) (HCC)    H/O DVT in left leg, filter placed approximatly 8 yrs ago   H/O spinal fusion 1998   Back pain   Hypertension    controlled on meds   Personal history of tobacco use, presenting hazards to health 06/12/2015   Personal history of tobacco use, presenting hazards to health 06/12/2015   Prostate cancer [facility])    Had prostatectomy   Wears dentures    upper    Social History   Socioeconomic History   Marital status: Widowed    Spouse name: Not on file   Number of children: 2   Years of education: HS Diploma   Highest education  level: 12th grade  Occupational History   Occupation: Retired  Tobacco Use   Smoking status: Every Day    Packs/day: 0.50    Years: 50.00    Total pack years: 25.00    Types: Cigarettes   Smokeless tobacco: Never  Vaping Use   Vaping Use: Never used  Substance and Sexual Activity   Alcohol use: No    Alcohol/week: 0.0 standard drinks of alcohol   Drug use: No   Sexual activity: Not on file  Other Topics Concern   Not on file  Social History Narrative   Son passed at age 25 from cerebral palsy.   Wife passed away in 05/02/2021 from stomach cancer   Patient move to [place] Summer 2023   Social Determinants of Health   Financial Resource Strain: Low Risk  (02/03/2022)   Overall Financial Resource Strain (CARDIA)    Difficulty of Paying Living Expenses: Not hard at all  Food Insecurity: No Food Insecurity (02/03/2022)   Hunger Vital Sign    Worried About Running Out of Food in the Last Year: Never true    Ran Out of Food in the Last Year: Never true  Transportation Needs: No Transportation Needs (02/03/2022)   PRAPARE - Hydrologist (Medical): No    Lack of Transportation (Non-Medical): No  Physical Activity: Insufficiently Active (02/03/2022)   Exercise Vital Sign    Days of Exercise per Week: 3 days    Minutes of Exercise per Session: 30 min  Stress: No Stress Concern Present (02/03/2022)   [place]    Feeling of Stress : Not at all  Social Connections: Socially Isolated (02/03/2022)   Social Connection and Isolation Panel [NHANES]    Frequency of Communication with Friends and Family: Twice a week    Frequency of Social Gatherings with Friends and Family: Never    Attends Religious Services: More than 4 times per year    Active Member of [employer] or Organizations: No    Attends Archivist Meetings: Never    Marital Status: Widowed  Intimate Partner Violence: Not At Risk  (02/03/2022)   Humiliation, Afraid, Rape, and Kick questionnaire    Fear of Current or Ex-Partner: No    Emotionally Abused: No    Physically Abused: No    Sexually Abused: No  Past Surgical History:  Procedure Laterality Date   BACK SURGERY  1998   Lumbar spine fusion   carotid doppler ultrasound  10/05/2011   75-90% RCA occlusion, 50% on left   CAROTID ENDARTERECTOMY Right 12/02/2011   Dr. Lucky Cowboy   CHOLECYSTECTOMY  10/02/2010   Laparoscopic, Dr. Pat Patrick, [place] WITH PROPOFOL N/A 06/20/2017   Procedure: COLONOSCOPY WITH PROPOFOL;  Surgeon: [name], MD;  Location: [facility];  Service: Endoscopy;  Laterality: N/A;   CT SCAN  10/02/2010   [facility]; Infrarenal suprailiac abdominal aortic aneurysm maximum dimention of nearly 4cm. Cholelithiasis and acute cholecystiis. Mild enlargement of left adrenal gland   ENDOVASCULAR REPAIR/STENT GRAFT N/A 03/29/2018   Procedure: ENDOVASCULAR REPAIR/STENT GRAFT;  Surgeon: Algernon Huxley, MD;  Location: [facility];  Service: Cardiovascular;  Laterality: N/A;   LOWER EXTREMITY ANGIOGRAPHY Right 01/28/2022   Procedure: Lower Extremity Angiography;  Surgeon: Algernon Huxley, MD;  Location: [facility];  Service: Cardiovascular;  Laterality: Right;   LUMBAR SPINE HARDWARE REMOVAL  07/30/2016   [name], MD   PROSTATE SURGERY  10/19/2012   prostatectomy. [facility]; Laser assisted, Done by [name] for prostate cancer    Family History  Problem Relation Age of Onset   Heart attack Mother    Congestive Heart Failure Father    Cerebral palsy Son     Allergies  Allergen Reactions   Zolpidem     Other reaction(s): Other (See Comments) GOT UP IN MIDDLE OF NIGHT AND FILLED ALL THE GLASSES WITH FLOUR       Latest Ref Rng & Units 02/07/2022    8:56 AM 11/02/2021    6:10 PM 09/08/2021   10:50 AM  CBC  WBC 4.0 - 10.5 K/uL 13.2  11.4  10.2   Hemoglobin 13.0 - 17.0 g/dL 13.9  16.3  15.8   Hematocrit 39.0  - 52.0 % 40.4  48.6  46.4   Platelets 150 - 400 K/uL 258  188  192       CMP     Component Value Date/Time   NA 133 (L) 02/07/2022 0856   NA 141 12/31/2021 0837   NA 144 12/03/2011 0613   K 3.3 (L) 02/07/2022 0856   K 3.5 12/03/2011 0613   CL 96 (L) 02/07/2022 0856   CL 108 (H) 12/03/2011 0613   CO2 21 (L) 02/07/2022 0856   CO2 27 12/03/2011 0613   GLUCOSE 115 (H) 02/07/2022 0856   GLUCOSE 94 12/03/2011 0613   BUN 31 (H) 02/07/2022 0856   BUN 35 (H) 12/31/2021 0837   BUN 9 12/03/2011 0613   CREATININE 1.64 (H) 02/07/2022 0856   CREATININE 0.83 12/03/2011 0613   CALCIUM 9.4 02/07/2022 0856   CALCIUM 8.7 12/03/2011 0613   PROT 7.4 02/07/2022 0856   PROT 6.7 09/08/2021 1050   ALBUMIN 4.1 02/07/2022 0856   ALBUMIN 4.4 12/31/2021 0837   AST 30 02/07/2022 0856   ALT 19 02/07/2022 0856   ALKPHOS 50 02/07/2022 0856   BILITOT 1.1 02/07/2022 0856   BILITOT 0.6 09/08/2021 1050   GFRNONAA 43 (L) 02/07/2022 0856   GFRNONAA >60 12/03/2011 0613   GFRAA 56 (L) 11/27/2019 1145   GFRAA >60 12/03/2011 0613     VAS [place] ABI WITH/WO TBI  Result Date: 01/07/2022  LOWER EXTREMITY DOPPLER STUDY Patient Name:  Justin Harrington  Date of Exam:   12/30/2021 Medical Rec #: [id number]  Accession #:    [id number] Date of Birth: 03-13-1945            Patient Gender: M Patient Age:   89 years Exam Location:  [facility] Procedure:      VAS [place] ABI WITH/WO TBI Referring Phys: Eulogio Ditch --------------------------------------------------------------------------------  Indications: Peripheral artery disease, and Bilateral leg pain. High Risk         Hypertension, current smoker, coronary artery disease, prior Factors:          CVA.  Vascular Interventions: EVAR 03/29/2018. Performing Technologist: Delorise Shiner RVT  Examination Guidelines: A complete evaluation includes at minimum, Doppler waveform signals and systolic blood pressure reading at the level of bilateral brachial, anterior  tibial, and posterior tibial arteries, when vessel segments are accessible. Bilateral testing is considered an integral part of a complete examination. Photoelectric Plethysmograph (PPG) waveforms and toe systolic pressure readings are included as required and additional duplex testing as needed. Limited examinations for reoccurring indications may be performed as noted.  ABI Findings: +---------+------------------+-----+----------+--------+ Right    Rt Pressure (mmHg)IndexWaveform  Comment  +---------+------------------+-----+----------+--------+ Brachial 133                                       +---------+------------------+-----+----------+--------+ PTA      0                 0.00 absent             +---------+------------------+-----+----------+--------+ PERO     85                0.63 monophasic         +---------+------------------+-----+----------+--------+ DP       88                0.65 monophasic         +---------+------------------+-----+----------+--------+ Great Toe29                0.21                    +---------+------------------+-----+----------+--------+ +---------+------------------+-----+---------+-------+ Left     Lt Pressure (mmHg)IndexWaveform Comment +---------+------------------+-----+---------+-------+ Brachial 135                                     +---------+------------------+-----+---------+-------+ PTA      110               0.81 biphasic         +---------+------------------+-----+---------+-------+ DP       130               0.96 triphasic        +---------+------------------+-----+---------+-------+ Great Toe45                0.33                  +---------+------------------+-----+---------+-------+ +-------+-----------+-----------+------------+------------+ ABI/TBIToday's ABIToday's TBIPrevious ABIPrevious TBI +-------+-----------+-----------+------------+------------+ Right  0.65       0.21        0.88        1.06         +-------+-----------+-----------+------------+------------+ Left   0.96       0.33       0.98        0.86         +-------+-----------+-----------+------------+------------+  Limited imaging showed right SFA occlusion. Right ABIs appear decreased compared to prior study on 12/19/2019. Left ABIs appear essentially unchanged compared to prior study on 12/19/2019.  Summary: Right: Resting right ankle-brachial index indicates moderate right lower extremity arterial disease. The right toe-brachial index is abnormal. Left: Resting left ankle-brachial index is within normal range. The left toe-brachial index is abnormal. *See table(s) above for measurements and observations.  Electronically signed by Hortencia Pilar MD on 01/07/2022 at 12:27:11 PM.    Final        Assessment & Plan:   1. Atherosclerosis of native artery of both lower extremities with rest pain ([place])  Recommend:  The patient has evidence of severe atherosclerotic changes of both lower extremities associated with ulceration and tissue loss of the bilateral feet.  This represents a limb threatening ischemia and places the patient at a high risk for limb loss.  Angiography has been performed and the situation is not ideal for intervention.  Given this finding open surgical repair is recommended.   Patient should undergo arterial reconstruction, of the bilateral lower extremity with the hope for limb salvage.  The patient have a bilateral femoral endarterectomy with right SFA stent placement.  The risks and benefits as well as the alternative therapies was discussed in detail with the patient.  All questions were answered.  Patient agrees to proceed with open vascular surgical reconstruction.  The patient will follow up with me in the office after the procedure.    2. Primary hypertension Continue antihypertensive medications as already ordered, these medications have been reviewed and there are no changes at  this time.  3. Hyperlipidemia, unspecified hyperlipidemia type Continue statin as ordered and reviewed, no changes at this time   Current Outpatient Medications on File Prior to Visit  Medication Sig Dispense Refill   amLODipine (NORVASC) 2.5 MG tablet Take 1 tablet (2.5 mg total) by mouth daily. 30 tablet 1   hydrochlorothiazide (HYDRODIURIL) 25 MG tablet TAKE 1 TABLET(25 MG) BY MOUTH DAILY 90 tablet 4   naloxone (NARCAN) nasal spray 4 mg/0.1 mL Place 4 mg into the nose as directed.     oxyCODONE (ROXICODONE) 15 MG immediate release tablet Take 1 tablet (15 mg total) by mouth 4 (four) times daily as needed for pain. 120 tablet 0   polyethylene glycol (MIRALAX / GLYCOLAX) 17 g packet Take 17 g by mouth daily as needed.     Sennosides (EX-LAX PO) Take 3-4 tablets by mouth every 3 (three) days as needed (constipation).     aspirin EC 81 MG tablet Take 81 mg by mouth daily. (Patient not taking: Reported on 01/28/2022)     atorvastatin (LIPITOR) 80 MG tablet Take 1 tablet (80 mg total) by mouth daily. (Patient not taking: Reported on 02/03/2022) 90 tablet 4   clopidogrel (PLAVIX) 75 MG tablet Take 1 tablet (75 mg total) by mouth daily. (Patient not taking: Reported on 02/03/2022) 90 tablet 4   FLUoxetine (PROZAC) 20 MG capsule Take 1 capsule (20 mg total) by mouth daily. (Patient not taking: Reported on 02/03/2022) 30 capsule 1   No current facility-administered medications on file prior to visit.    There are no Patient Instructions on file for this visit. No follow-ups on file.   Kris Hartmann, NP

## 2022-02-25 DIAGNOSIS — R2689 Other abnormalities of gait and mobility: Secondary | ICD-10-CM | POA: Diagnosis not present

## 2022-02-25 DIAGNOSIS — M1380 Other specified arthritis, unspecified site: Secondary | ICD-10-CM | POA: Diagnosis not present

## 2022-02-25 DIAGNOSIS — R2681 Unsteadiness on feet: Secondary | ICD-10-CM | POA: Diagnosis not present

## 2022-02-26 DIAGNOSIS — M1380 Other specified arthritis, unspecified site: Secondary | ICD-10-CM | POA: Diagnosis not present

## 2022-02-26 DIAGNOSIS — R2681 Unsteadiness on feet: Secondary | ICD-10-CM | POA: Diagnosis not present

## 2022-02-26 DIAGNOSIS — M6259 Muscle wasting and atrophy, not elsewhere classified, multiple sites: Secondary | ICD-10-CM | POA: Diagnosis not present

## 2022-02-26 DIAGNOSIS — R2689 Other abnormalities of gait and mobility: Secondary | ICD-10-CM | POA: Diagnosis not present

## 2022-02-28 DIAGNOSIS — M1380 Other specified arthritis, unspecified site: Secondary | ICD-10-CM | POA: Diagnosis not present

## 2022-02-28 DIAGNOSIS — M6259 Muscle wasting and atrophy, not elsewhere classified, multiple sites: Secondary | ICD-10-CM | POA: Diagnosis not present

## 2022-03-01 DIAGNOSIS — M6259 Muscle wasting and atrophy, not elsewhere classified, multiple sites: Secondary | ICD-10-CM | POA: Diagnosis not present

## 2022-03-01 DIAGNOSIS — M1380 Other specified arthritis, unspecified site: Secondary | ICD-10-CM | POA: Diagnosis not present

## 2022-03-02 ENCOUNTER — Other Ambulatory Visit: Payer: Self-pay | Admitting: Family Medicine

## 2022-03-02 DIAGNOSIS — R4789 Other speech disturbances: Secondary | ICD-10-CM | POA: Diagnosis not present

## 2022-03-02 DIAGNOSIS — R488 Other symbolic dysfunctions: Secondary | ICD-10-CM | POA: Diagnosis not present

## 2022-03-02 DIAGNOSIS — F4321 Adjustment disorder with depressed mood: Secondary | ICD-10-CM

## 2022-03-02 DIAGNOSIS — R2689 Other abnormalities of gait and mobility: Secondary | ICD-10-CM | POA: Diagnosis not present

## 2022-03-02 DIAGNOSIS — R2681 Unsteadiness on feet: Secondary | ICD-10-CM | POA: Diagnosis not present

## 2022-03-02 DIAGNOSIS — R41841 Cognitive communication deficit: Secondary | ICD-10-CM | POA: Diagnosis not present

## 2022-03-02 DIAGNOSIS — I10 Essential (primary) hypertension: Secondary | ICD-10-CM

## 2022-03-02 DIAGNOSIS — M1380 Other specified arthritis, unspecified site: Secondary | ICD-10-CM | POA: Diagnosis not present

## 2022-03-02 NOTE — Telephone Encounter (Signed)
Requested medication (s) are due for refill today: yes  Requested medication (s) are on the active medication list: yes  Last refill:  both refilled 11/03/21  Future visit scheduled: no  Notes to clinic:  both requested meds last ordered at [facility] by Dr. [place] Pink    Requested Prescriptions  Pending Prescriptions Disp Refills   amLODipine (NORVASC) 2.5 MG tablet [Pharmacy Med Name: AMLODIPINE BESYLATE 2.'5MG'$  TABLETS] 30 tablet 1    Sig: TAKE 1 TABLET(2.5 MG) BY MOUTH DAILY     Cardiovascular: Calcium Channel Blockers 2 Passed - 03/02/2022  8:30 AM      Passed - Last BP in normal range    BP Readings from Last 1 Encounters:  02/23/22 114/73         Passed - Last Heart Rate in normal range    Pulse Readings from Last 1 Encounters:  02/23/22 90         Passed - Valid encounter within last 6 months    Recent Outpatient Visits           2 months ago [place], [name] E, MD   3 months ago [place] [name], MD   5 months ago Annual physical exam   [facility] [name], MD   11 months ago Chronic, continuous use of opioids   [facility] [name], MD   2 years ago Syncope and collapse   [place], [name] E, MD               FLUoxetine (PROZAC) 20 MG capsule [Pharmacy Med Name: FLUOXETINE '20MG'$  CAPSULES] 30 capsule 1    Sig: TAKE 1 CAPSULE(20 MG) BY MOUTH DAILY     Psychiatry:  Antidepressants - SSRI Passed - 03/02/2022  8:30 AM      Passed - Completed PHQ-2 or PHQ-9 in the last 360 days      Passed - Valid encounter within last 6 months    Recent Outpatient Visits           2 months ago [place], [name] E, MD   3 months ago [place], [name]  E, MD   5 months ago Annual physical exam   [facility] [name], MD   11 months ago Chronic, continuous use of opioids   [facility] [name], MD   2 years ago Syncope and collapse   [place], [name] E, MD

## 2022-03-05 DIAGNOSIS — M1380 Other specified arthritis, unspecified site: Secondary | ICD-10-CM | POA: Diagnosis not present

## 2022-03-05 DIAGNOSIS — M6259 Muscle wasting and atrophy, not elsewhere classified, multiple sites: Secondary | ICD-10-CM | POA: Diagnosis not present

## 2022-03-08 DIAGNOSIS — I6523 Occlusion and stenosis of bilateral carotid arteries: Secondary | ICD-10-CM | POA: Diagnosis not present

## 2022-03-08 DIAGNOSIS — R2681 Unsteadiness on feet: Secondary | ICD-10-CM | POA: Diagnosis not present

## 2022-03-08 DIAGNOSIS — Z9889 Other specified postprocedural states: Secondary | ICD-10-CM | POA: Diagnosis not present

## 2022-03-08 DIAGNOSIS — I5032 Chronic diastolic (congestive) heart failure: Secondary | ICD-10-CM | POA: Diagnosis not present

## 2022-03-08 DIAGNOSIS — I10 Essential (primary) hypertension: Secondary | ICD-10-CM | POA: Diagnosis not present

## 2022-03-08 DIAGNOSIS — R2689 Other abnormalities of gait and mobility: Secondary | ICD-10-CM | POA: Diagnosis not present

## 2022-03-08 DIAGNOSIS — M1380 Other specified arthritis, unspecified site: Secondary | ICD-10-CM | POA: Diagnosis not present

## 2022-03-08 DIAGNOSIS — E785 Hyperlipidemia, unspecified: Secondary | ICD-10-CM | POA: Diagnosis not present

## 2022-03-08 DIAGNOSIS — I2510 Atherosclerotic heart disease of native coronary artery without angina pectoris: Secondary | ICD-10-CM | POA: Diagnosis not present

## 2022-03-08 DIAGNOSIS — Z01810 Encounter for preprocedural cardiovascular examination: Secondary | ICD-10-CM | POA: Diagnosis not present

## 2022-03-08 DIAGNOSIS — I7143 Infrarenal abdominal aortic aneurysm, without rupture: Secondary | ICD-10-CM | POA: Diagnosis not present

## 2022-03-08 DIAGNOSIS — Z7901 Long term (current) use of anticoagulants: Secondary | ICD-10-CM | POA: Diagnosis not present

## 2022-03-08 DIAGNOSIS — N1831 Chronic kidney disease, stage 3a: Secondary | ICD-10-CM | POA: Diagnosis not present

## 2022-03-09 DIAGNOSIS — M1380 Other specified arthritis, unspecified site: Secondary | ICD-10-CM | POA: Diagnosis not present

## 2022-03-09 DIAGNOSIS — M6259 Muscle wasting and atrophy, not elsewhere classified, multiple sites: Secondary | ICD-10-CM | POA: Diagnosis not present

## 2022-03-10 DIAGNOSIS — H26492 Other secondary cataract, left eye: Secondary | ICD-10-CM | POA: Diagnosis not present

## 2022-03-10 DIAGNOSIS — Z961 Presence of intraocular lens: Secondary | ICD-10-CM | POA: Diagnosis not present

## 2022-03-10 DIAGNOSIS — H2511 Age-related nuclear cataract, right eye: Secondary | ICD-10-CM | POA: Diagnosis not present

## 2022-03-10 DIAGNOSIS — Z0100 Encounter for examination of eyes and vision without abnormal findings: Secondary | ICD-10-CM | POA: Diagnosis not present

## 2022-03-10 DIAGNOSIS — H34832 Tributary (branch) retinal vein occlusion, left eye, with macular edema: Secondary | ICD-10-CM | POA: Diagnosis not present

## 2022-03-11 DIAGNOSIS — M1380 Other specified arthritis, unspecified site: Secondary | ICD-10-CM | POA: Diagnosis not present

## 2022-03-11 DIAGNOSIS — M6259 Muscle wasting and atrophy, not elsewhere classified, multiple sites: Secondary | ICD-10-CM | POA: Diagnosis not present

## 2022-03-15 DIAGNOSIS — R41841 Cognitive communication deficit: Secondary | ICD-10-CM | POA: Diagnosis not present

## 2022-03-15 DIAGNOSIS — R4789 Other speech disturbances: Secondary | ICD-10-CM | POA: Diagnosis not present

## 2022-03-15 DIAGNOSIS — R488 Other symbolic dysfunctions: Secondary | ICD-10-CM | POA: Diagnosis not present

## 2022-03-17 DIAGNOSIS — M1380 Other specified arthritis, unspecified site: Secondary | ICD-10-CM | POA: Diagnosis not present

## 2022-03-17 DIAGNOSIS — R2681 Unsteadiness on feet: Secondary | ICD-10-CM | POA: Diagnosis not present

## 2022-03-17 DIAGNOSIS — M6259 Muscle wasting and atrophy, not elsewhere classified, multiple sites: Secondary | ICD-10-CM | POA: Diagnosis not present

## 2022-03-17 DIAGNOSIS — R2689 Other abnormalities of gait and mobility: Secondary | ICD-10-CM | POA: Diagnosis not present

## 2022-03-18 DIAGNOSIS — R41841 Cognitive communication deficit: Secondary | ICD-10-CM | POA: Diagnosis not present

## 2022-03-18 DIAGNOSIS — R4789 Other speech disturbances: Secondary | ICD-10-CM | POA: Diagnosis not present

## 2022-03-18 DIAGNOSIS — R2689 Other abnormalities of gait and mobility: Secondary | ICD-10-CM | POA: Diagnosis not present

## 2022-03-18 DIAGNOSIS — R2681 Unsteadiness on feet: Secondary | ICD-10-CM | POA: Diagnosis not present

## 2022-03-18 DIAGNOSIS — R488 Other symbolic dysfunctions: Secondary | ICD-10-CM | POA: Diagnosis not present

## 2022-03-18 DIAGNOSIS — M1380 Other specified arthritis, unspecified site: Secondary | ICD-10-CM | POA: Diagnosis not present

## 2022-03-22 DIAGNOSIS — R2689 Other abnormalities of gait and mobility: Secondary | ICD-10-CM | POA: Diagnosis not present

## 2022-03-22 DIAGNOSIS — R2681 Unsteadiness on feet: Secondary | ICD-10-CM | POA: Diagnosis not present

## 2022-03-22 DIAGNOSIS — M1380 Other specified arthritis, unspecified site: Secondary | ICD-10-CM | POA: Diagnosis not present

## 2022-03-23 ENCOUNTER — Encounter (INDEPENDENT_AMBULATORY_CARE_PROVIDER_SITE_OTHER): Payer: Self-pay

## 2022-03-23 ENCOUNTER — Telehealth (INDEPENDENT_AMBULATORY_CARE_PROVIDER_SITE_OTHER): Payer: Self-pay

## 2022-03-23 NOTE — Telephone Encounter (Signed)
Patient's daughter called back and let me know that the patient will be moved to [place] near [place] to have him closer to her. The daughter stated she is in the process of getting him established with a vascular surgeon now and wanted to put off having the surgery and she will bring it up when he is with another Psychologist, sport and exercise.

## 2022-03-23 NOTE — Telephone Encounter (Signed)
I attempted to contact the patient's daughter to schedule the patient for a bilateral femoral endartectomies and right SFA stent placement. A message was left for a return call.

## 2022-03-24 DIAGNOSIS — M1380 Other specified arthritis, unspecified site: Secondary | ICD-10-CM | POA: Diagnosis not present

## 2022-03-24 DIAGNOSIS — R2689 Other abnormalities of gait and mobility: Secondary | ICD-10-CM | POA: Diagnosis not present

## 2022-03-24 DIAGNOSIS — R2681 Unsteadiness on feet: Secondary | ICD-10-CM | POA: Diagnosis not present

## 2022-03-24 DIAGNOSIS — M6259 Muscle wasting and atrophy, not elsewhere classified, multiple sites: Secondary | ICD-10-CM | POA: Diagnosis not present

## 2022-03-25 ENCOUNTER — Other Ambulatory Visit (INDEPENDENT_AMBULATORY_CARE_PROVIDER_SITE_OTHER): Payer: Self-pay | Admitting: Nurse Practitioner

## 2022-03-25 DIAGNOSIS — I70223 Atherosclerosis of native arteries of extremities with rest pain, bilateral legs: Secondary | ICD-10-CM

## 2022-03-26 DIAGNOSIS — R2689 Other abnormalities of gait and mobility: Secondary | ICD-10-CM | POA: Diagnosis not present

## 2022-03-26 DIAGNOSIS — M1380 Other specified arthritis, unspecified site: Secondary | ICD-10-CM | POA: Diagnosis not present

## 2022-03-26 DIAGNOSIS — R2681 Unsteadiness on feet: Secondary | ICD-10-CM | POA: Diagnosis not present

## 2022-03-29 DIAGNOSIS — M1380 Other specified arthritis, unspecified site: Secondary | ICD-10-CM | POA: Diagnosis not present

## 2022-03-29 DIAGNOSIS — M6259 Muscle wasting and atrophy, not elsewhere classified, multiple sites: Secondary | ICD-10-CM | POA: Diagnosis not present

## 2022-03-30 DIAGNOSIS — R2689 Other abnormalities of gait and mobility: Secondary | ICD-10-CM | POA: Diagnosis not present

## 2022-03-30 DIAGNOSIS — M1380 Other specified arthritis, unspecified site: Secondary | ICD-10-CM | POA: Diagnosis not present

## 2022-03-30 DIAGNOSIS — R2681 Unsteadiness on feet: Secondary | ICD-10-CM | POA: Diagnosis not present

## 2022-04-01 DIAGNOSIS — R488 Other symbolic dysfunctions: Secondary | ICD-10-CM | POA: Diagnosis not present

## 2022-04-01 DIAGNOSIS — R41841 Cognitive communication deficit: Secondary | ICD-10-CM | POA: Diagnosis not present

## 2022-04-01 DIAGNOSIS — R4789 Other speech disturbances: Secondary | ICD-10-CM | POA: Diagnosis not present

## 2022-04-09 ENCOUNTER — Encounter: Payer: Self-pay | Admitting: Family Medicine

## 2022-04-15 ENCOUNTER — Other Ambulatory Visit: Payer: Self-pay | Admitting: Family Medicine

## 2022-04-15 DIAGNOSIS — I10 Essential (primary) hypertension: Secondary | ICD-10-CM

## 2022-04-28 ENCOUNTER — Other Ambulatory Visit: Payer: Self-pay | Admitting: Family Medicine

## 2022-04-28 NOTE — Telephone Encounter (Signed)
Medication Refill - Medication:  hydrochlorothiazide (HYDRODIURIL) 25 MG tablet  Has the patient contacted their pharmacy? Yes.   Chuck Hint from [facility] called for refill   Preferred Pharmacy (with phone number or street name):  [place], [place] Phone: [phone]  Fax: [phone]     Has the patient been seen for an appointment in the last year OR does the patient have an upcoming appointment? No.

## 2022-04-29 MED ORDER — HYDROCHLOROTHIAZIDE 25 MG PO TABS: ORAL_TABLET | ORAL | 0 refills | Status: DC

## 2022-04-29 NOTE — Telephone Encounter (Signed)
Labs are in date.  Requested Prescriptions  Pending Prescriptions Disp Refills   hydrochlorothiazide (HYDRODIURIL) 25 MG tablet 90 tablet 0    Sig: TAKE 1 TABLET(25 MG) BY MOUTH DAILY     Cardiovascular: Diuretics - Thiazide Failed - 04/28/2022  2:28 PM      Failed - Cr in normal range and within 180 days    Creatinine  Date Value Ref Range Status  12/03/2011 0.83 0.60 - 1.30 mg/dL Final   Creatinine, Ser  Date Value Ref Range Status  02/07/2022 1.64 (H) 0.61 - 1.24 mg/dL Final         Failed - K in normal range and within 180 days    Potassium  Date Value Ref Range Status  02/07/2022 3.3 (L) 3.5 - 5.1 mmol/L Final  12/03/2011 3.5 3.5 - 5.1 mmol/L Final         Failed - Na in normal range and within 180 days    Sodium  Date Value Ref Range Status  02/07/2022 133 (L) 135 - 145 mmol/L Final  12/31/2021 141 134 - 144 mmol/L Final  12/03/2011 144 136 - 145 mmol/L Final         Passed - Last BP in normal range    BP Readings from Last 1 Encounters:  02/23/22 114/73         Passed - Valid encounter within last 6 months    Recent Outpatient Visits           4 months ago [place], [name] E, MD   5 months ago [place] [name], MD   7 months ago Annual physical exam   [facility] [name], MD   1 year ago Chronic, continuous use of opioids   [facility] [name], MD   2 years ago Syncope and collapse   [place], [name] E, MD

## 2022-05-06 DIAGNOSIS — I2510 Atherosclerotic heart disease of native coronary artery without angina pectoris: Secondary | ICD-10-CM | POA: Diagnosis not present

## 2022-05-06 DIAGNOSIS — I10 Essential (primary) hypertension: Secondary | ICD-10-CM | POA: Diagnosis not present

## 2022-05-06 DIAGNOSIS — E785 Hyperlipidemia, unspecified: Secondary | ICD-10-CM | POA: Diagnosis not present

## 2022-05-13 DIAGNOSIS — D529 Folate deficiency anemia, unspecified: Secondary | ICD-10-CM | POA: Diagnosis not present

## 2022-05-13 DIAGNOSIS — G309 Alzheimer's disease, unspecified: Secondary | ICD-10-CM | POA: Diagnosis not present

## 2022-05-13 DIAGNOSIS — E039 Hypothyroidism, unspecified: Secondary | ICD-10-CM | POA: Diagnosis not present

## 2022-05-13 DIAGNOSIS — E785 Hyperlipidemia, unspecified: Secondary | ICD-10-CM | POA: Diagnosis not present

## 2022-05-21 DIAGNOSIS — I70223 Atherosclerosis of native arteries of extremities with rest pain, bilateral legs: Secondary | ICD-10-CM | POA: Diagnosis not present

## 2022-05-21 DIAGNOSIS — F17210 Nicotine dependence, cigarettes, uncomplicated: Secondary | ICD-10-CM | POA: Diagnosis not present

## 2022-05-21 DIAGNOSIS — I7143 Infrarenal abdominal aortic aneurysm, without rupture: Secondary | ICD-10-CM | POA: Diagnosis not present

## 2022-05-21 DIAGNOSIS — I6523 Occlusion and stenosis of bilateral carotid arteries: Secondary | ICD-10-CM | POA: Diagnosis not present

## 2022-05-26 DIAGNOSIS — H5462 Unqualified visual loss, left eye, normal vision right eye: Secondary | ICD-10-CM | POA: Diagnosis not present

## 2022-05-26 DIAGNOSIS — I10 Essential (primary) hypertension: Secondary | ICD-10-CM | POA: Diagnosis not present

## 2022-05-26 DIAGNOSIS — Z7982 Long term (current) use of aspirin: Secondary | ICD-10-CM | POA: Diagnosis not present

## 2022-05-26 DIAGNOSIS — E785 Hyperlipidemia, unspecified: Secondary | ICD-10-CM | POA: Diagnosis not present

## 2022-05-26 DIAGNOSIS — I69398 Other sequelae of cerebral infarction: Secondary | ICD-10-CM | POA: Diagnosis not present

## 2022-05-26 DIAGNOSIS — I69318 Other symptoms and signs involving cognitive functions following cerebral infarction: Secondary | ICD-10-CM | POA: Diagnosis not present

## 2022-05-26 DIAGNOSIS — I739 Peripheral vascular disease, unspecified: Secondary | ICD-10-CM | POA: Diagnosis not present

## 2022-05-26 DIAGNOSIS — I69354 Hemiplegia and hemiparesis following cerebral infarction affecting left non-dominant side: Secondary | ICD-10-CM | POA: Diagnosis not present

## 2022-05-26 DIAGNOSIS — I2510 Atherosclerotic heart disease of native coronary artery without angina pectoris: Secondary | ICD-10-CM | POA: Diagnosis not present

## 2022-05-28 DIAGNOSIS — I10 Essential (primary) hypertension: Secondary | ICD-10-CM | POA: Diagnosis not present

## 2022-05-28 DIAGNOSIS — I69354 Hemiplegia and hemiparesis following cerebral infarction affecting left non-dominant side: Secondary | ICD-10-CM | POA: Diagnosis not present

## 2022-05-28 DIAGNOSIS — I69398 Other sequelae of cerebral infarction: Secondary | ICD-10-CM | POA: Diagnosis not present

## 2022-05-28 DIAGNOSIS — I2510 Atherosclerotic heart disease of native coronary artery without angina pectoris: Secondary | ICD-10-CM | POA: Diagnosis not present

## 2022-05-28 DIAGNOSIS — I69318 Other symptoms and signs involving cognitive functions following cerebral infarction: Secondary | ICD-10-CM | POA: Diagnosis not present

## 2022-05-28 DIAGNOSIS — Z7982 Long term (current) use of aspirin: Secondary | ICD-10-CM | POA: Diagnosis not present

## 2022-05-28 DIAGNOSIS — E785 Hyperlipidemia, unspecified: Secondary | ICD-10-CM | POA: Diagnosis not present

## 2022-05-28 DIAGNOSIS — H5462 Unqualified visual loss, left eye, normal vision right eye: Secondary | ICD-10-CM | POA: Diagnosis not present

## 2022-05-28 DIAGNOSIS — I739 Peripheral vascular disease, unspecified: Secondary | ICD-10-CM | POA: Diagnosis not present

## 2022-05-31 DIAGNOSIS — I10 Essential (primary) hypertension: Secondary | ICD-10-CM | POA: Diagnosis not present

## 2022-05-31 DIAGNOSIS — I69318 Other symptoms and signs involving cognitive functions following cerebral infarction: Secondary | ICD-10-CM | POA: Diagnosis not present

## 2022-05-31 DIAGNOSIS — I69398 Other sequelae of cerebral infarction: Secondary | ICD-10-CM | POA: Diagnosis not present

## 2022-05-31 DIAGNOSIS — I739 Peripheral vascular disease, unspecified: Secondary | ICD-10-CM | POA: Diagnosis not present

## 2022-05-31 DIAGNOSIS — H5462 Unqualified visual loss, left eye, normal vision right eye: Secondary | ICD-10-CM | POA: Diagnosis not present

## 2022-05-31 DIAGNOSIS — I69354 Hemiplegia and hemiparesis following cerebral infarction affecting left non-dominant side: Secondary | ICD-10-CM | POA: Diagnosis not present

## 2022-05-31 DIAGNOSIS — Z7982 Long term (current) use of aspirin: Secondary | ICD-10-CM | POA: Diagnosis not present

## 2022-05-31 DIAGNOSIS — E785 Hyperlipidemia, unspecified: Secondary | ICD-10-CM | POA: Diagnosis not present

## 2022-05-31 DIAGNOSIS — I2510 Atherosclerotic heart disease of native coronary artery without angina pectoris: Secondary | ICD-10-CM | POA: Diagnosis not present

## 2022-06-01 ENCOUNTER — Other Ambulatory Visit: Payer: Self-pay | Admitting: Family Medicine

## 2022-06-03 DIAGNOSIS — E559 Vitamin D deficiency, unspecified: Secondary | ICD-10-CM | POA: Diagnosis not present

## 2022-06-03 DIAGNOSIS — E539 Vitamin B deficiency, unspecified: Secondary | ICD-10-CM | POA: Diagnosis not present

## 2022-06-03 DIAGNOSIS — R490 Dysphonia: Secondary | ICD-10-CM | POA: Diagnosis not present

## 2022-06-04 DIAGNOSIS — Z7982 Long term (current) use of aspirin: Secondary | ICD-10-CM | POA: Diagnosis not present

## 2022-06-04 DIAGNOSIS — I2510 Atherosclerotic heart disease of native coronary artery without angina pectoris: Secondary | ICD-10-CM | POA: Diagnosis not present

## 2022-06-04 DIAGNOSIS — I10 Essential (primary) hypertension: Secondary | ICD-10-CM | POA: Diagnosis not present

## 2022-06-04 DIAGNOSIS — H5462 Unqualified visual loss, left eye, normal vision right eye: Secondary | ICD-10-CM | POA: Diagnosis not present

## 2022-06-04 DIAGNOSIS — I739 Peripheral vascular disease, unspecified: Secondary | ICD-10-CM | POA: Diagnosis not present

## 2022-06-04 DIAGNOSIS — I69318 Other symptoms and signs involving cognitive functions following cerebral infarction: Secondary | ICD-10-CM | POA: Diagnosis not present

## 2022-06-04 DIAGNOSIS — I69398 Other sequelae of cerebral infarction: Secondary | ICD-10-CM | POA: Diagnosis not present

## 2022-06-04 DIAGNOSIS — I69354 Hemiplegia and hemiparesis following cerebral infarction affecting left non-dominant side: Secondary | ICD-10-CM | POA: Diagnosis not present

## 2022-06-04 DIAGNOSIS — E785 Hyperlipidemia, unspecified: Secondary | ICD-10-CM | POA: Diagnosis not present

## 2022-06-07 DIAGNOSIS — I70203 Unspecified atherosclerosis of native arteries of extremities, bilateral legs: Secondary | ICD-10-CM | POA: Diagnosis not present

## 2022-06-07 DIAGNOSIS — I739 Peripheral vascular disease, unspecified: Secondary | ICD-10-CM | POA: Diagnosis not present

## 2022-06-07 DIAGNOSIS — T82898A Other specified complication of vascular prosthetic devices, implants and grafts, initial encounter: Secondary | ICD-10-CM | POA: Diagnosis not present

## 2022-06-08 DIAGNOSIS — I69398 Other sequelae of cerebral infarction: Secondary | ICD-10-CM | POA: Diagnosis not present

## 2022-06-08 DIAGNOSIS — I69354 Hemiplegia and hemiparesis following cerebral infarction affecting left non-dominant side: Secondary | ICD-10-CM | POA: Diagnosis not present

## 2022-06-08 DIAGNOSIS — H5462 Unqualified visual loss, left eye, normal vision right eye: Secondary | ICD-10-CM | POA: Diagnosis not present

## 2022-06-11 DIAGNOSIS — I2510 Atherosclerotic heart disease of native coronary artery without angina pectoris: Secondary | ICD-10-CM | POA: Diagnosis not present

## 2022-06-11 DIAGNOSIS — H5462 Unqualified visual loss, left eye, normal vision right eye: Secondary | ICD-10-CM | POA: Diagnosis not present

## 2022-06-11 DIAGNOSIS — I69398 Other sequelae of cerebral infarction: Secondary | ICD-10-CM | POA: Diagnosis not present

## 2022-06-11 DIAGNOSIS — I69354 Hemiplegia and hemiparesis following cerebral infarction affecting left non-dominant side: Secondary | ICD-10-CM | POA: Diagnosis not present

## 2022-06-11 DIAGNOSIS — I69318 Other symptoms and signs involving cognitive functions following cerebral infarction: Secondary | ICD-10-CM | POA: Diagnosis not present

## 2022-06-11 DIAGNOSIS — I10 Essential (primary) hypertension: Secondary | ICD-10-CM | POA: Diagnosis not present

## 2022-06-11 DIAGNOSIS — I739 Peripheral vascular disease, unspecified: Secondary | ICD-10-CM | POA: Diagnosis not present

## 2022-06-11 DIAGNOSIS — E785 Hyperlipidemia, unspecified: Secondary | ICD-10-CM | POA: Diagnosis not present

## 2022-06-11 DIAGNOSIS — Z7982 Long term (current) use of aspirin: Secondary | ICD-10-CM | POA: Diagnosis not present

## 2022-06-15 DIAGNOSIS — I739 Peripheral vascular disease, unspecified: Secondary | ICD-10-CM | POA: Diagnosis not present

## 2022-06-15 DIAGNOSIS — I2510 Atherosclerotic heart disease of native coronary artery without angina pectoris: Secondary | ICD-10-CM | POA: Diagnosis not present

## 2022-06-15 DIAGNOSIS — I69398 Other sequelae of cerebral infarction: Secondary | ICD-10-CM | POA: Diagnosis not present

## 2022-06-15 DIAGNOSIS — H5462 Unqualified visual loss, left eye, normal vision right eye: Secondary | ICD-10-CM | POA: Diagnosis not present

## 2022-06-15 DIAGNOSIS — Z7982 Long term (current) use of aspirin: Secondary | ICD-10-CM | POA: Diagnosis not present

## 2022-06-15 DIAGNOSIS — I10 Essential (primary) hypertension: Secondary | ICD-10-CM | POA: Diagnosis not present

## 2022-06-15 DIAGNOSIS — I69318 Other symptoms and signs involving cognitive functions following cerebral infarction: Secondary | ICD-10-CM | POA: Diagnosis not present

## 2022-06-15 DIAGNOSIS — I69354 Hemiplegia and hemiparesis following cerebral infarction affecting left non-dominant side: Secondary | ICD-10-CM | POA: Diagnosis not present

## 2022-06-15 DIAGNOSIS — E785 Hyperlipidemia, unspecified: Secondary | ICD-10-CM | POA: Diagnosis not present

## 2022-06-18 DIAGNOSIS — I69398 Other sequelae of cerebral infarction: Secondary | ICD-10-CM | POA: Diagnosis not present

## 2022-06-18 DIAGNOSIS — I69354 Hemiplegia and hemiparesis following cerebral infarction affecting left non-dominant side: Secondary | ICD-10-CM | POA: Diagnosis not present

## 2022-06-18 DIAGNOSIS — I69318 Other symptoms and signs involving cognitive functions following cerebral infarction: Secondary | ICD-10-CM | POA: Diagnosis not present

## 2022-06-18 DIAGNOSIS — I10 Essential (primary) hypertension: Secondary | ICD-10-CM | POA: Diagnosis not present

## 2022-06-18 DIAGNOSIS — I2510 Atherosclerotic heart disease of native coronary artery without angina pectoris: Secondary | ICD-10-CM | POA: Diagnosis not present

## 2022-06-18 DIAGNOSIS — Z7982 Long term (current) use of aspirin: Secondary | ICD-10-CM | POA: Diagnosis not present

## 2022-06-18 DIAGNOSIS — I739 Peripheral vascular disease, unspecified: Secondary | ICD-10-CM | POA: Diagnosis not present

## 2022-06-18 DIAGNOSIS — H5462 Unqualified visual loss, left eye, normal vision right eye: Secondary | ICD-10-CM | POA: Diagnosis not present

## 2022-06-18 DIAGNOSIS — E785 Hyperlipidemia, unspecified: Secondary | ICD-10-CM | POA: Diagnosis not present

## 2022-06-22 DIAGNOSIS — I739 Peripheral vascular disease, unspecified: Secondary | ICD-10-CM | POA: Diagnosis not present

## 2022-06-22 DIAGNOSIS — I69354 Hemiplegia and hemiparesis following cerebral infarction affecting left non-dominant side: Secondary | ICD-10-CM | POA: Diagnosis not present

## 2022-06-22 DIAGNOSIS — I69318 Other symptoms and signs involving cognitive functions following cerebral infarction: Secondary | ICD-10-CM | POA: Diagnosis not present

## 2022-06-22 DIAGNOSIS — E785 Hyperlipidemia, unspecified: Secondary | ICD-10-CM | POA: Diagnosis not present

## 2022-06-22 DIAGNOSIS — I10 Essential (primary) hypertension: Secondary | ICD-10-CM | POA: Diagnosis not present

## 2022-06-22 DIAGNOSIS — I2510 Atherosclerotic heart disease of native coronary artery without angina pectoris: Secondary | ICD-10-CM | POA: Diagnosis not present

## 2022-06-22 DIAGNOSIS — H5462 Unqualified visual loss, left eye, normal vision right eye: Secondary | ICD-10-CM | POA: Diagnosis not present

## 2022-06-22 DIAGNOSIS — I69398 Other sequelae of cerebral infarction: Secondary | ICD-10-CM | POA: Diagnosis not present

## 2022-06-22 DIAGNOSIS — Z7982 Long term (current) use of aspirin: Secondary | ICD-10-CM | POA: Diagnosis not present

## 2022-06-29 DIAGNOSIS — Z95828 Presence of other vascular implants and grafts: Secondary | ICD-10-CM | POA: Diagnosis not present

## 2022-06-29 DIAGNOSIS — I714 Abdominal aortic aneurysm, without rupture, unspecified: Secondary | ICD-10-CM | POA: Diagnosis not present

## 2022-06-29 DIAGNOSIS — I6529 Occlusion and stenosis of unspecified carotid artery: Secondary | ICD-10-CM | POA: Diagnosis not present

## 2022-06-30 DIAGNOSIS — I10 Essential (primary) hypertension: Secondary | ICD-10-CM | POA: Diagnosis not present

## 2022-06-30 DIAGNOSIS — I69398 Other sequelae of cerebral infarction: Secondary | ICD-10-CM | POA: Diagnosis not present

## 2022-06-30 DIAGNOSIS — I739 Peripheral vascular disease, unspecified: Secondary | ICD-10-CM | POA: Diagnosis not present

## 2022-06-30 DIAGNOSIS — Z7982 Long term (current) use of aspirin: Secondary | ICD-10-CM | POA: Diagnosis not present

## 2022-06-30 DIAGNOSIS — I69354 Hemiplegia and hemiparesis following cerebral infarction affecting left non-dominant side: Secondary | ICD-10-CM | POA: Diagnosis not present

## 2022-06-30 DIAGNOSIS — H43813 Vitreous degeneration, bilateral: Secondary | ICD-10-CM | POA: Diagnosis not present

## 2022-06-30 DIAGNOSIS — H34232 Retinal artery branch occlusion, left eye: Secondary | ICD-10-CM | POA: Diagnosis not present

## 2022-06-30 DIAGNOSIS — D3131 Benign neoplasm of right choroid: Secondary | ICD-10-CM | POA: Diagnosis not present

## 2022-06-30 DIAGNOSIS — I2510 Atherosclerotic heart disease of native coronary artery without angina pectoris: Secondary | ICD-10-CM | POA: Diagnosis not present

## 2022-06-30 DIAGNOSIS — E785 Hyperlipidemia, unspecified: Secondary | ICD-10-CM | POA: Diagnosis not present

## 2022-06-30 DIAGNOSIS — I69318 Other symptoms and signs involving cognitive functions following cerebral infarction: Secondary | ICD-10-CM | POA: Diagnosis not present

## 2022-06-30 DIAGNOSIS — H5462 Unqualified visual loss, left eye, normal vision right eye: Secondary | ICD-10-CM | POA: Diagnosis not present

## 2022-06-30 DIAGNOSIS — Z961 Presence of intraocular lens: Secondary | ICD-10-CM | POA: Diagnosis not present

## 2022-06-30 DIAGNOSIS — H25811 Combined forms of age-related cataract, right eye: Secondary | ICD-10-CM | POA: Diagnosis not present

## 2022-07-01 DIAGNOSIS — I69318 Other symptoms and signs involving cognitive functions following cerebral infarction: Secondary | ICD-10-CM | POA: Diagnosis not present

## 2022-07-01 DIAGNOSIS — H5462 Unqualified visual loss, left eye, normal vision right eye: Secondary | ICD-10-CM | POA: Diagnosis not present

## 2022-07-01 DIAGNOSIS — I69354 Hemiplegia and hemiparesis following cerebral infarction affecting left non-dominant side: Secondary | ICD-10-CM | POA: Diagnosis not present

## 2022-07-01 DIAGNOSIS — I2510 Atherosclerotic heart disease of native coronary artery without angina pectoris: Secondary | ICD-10-CM | POA: Diagnosis not present

## 2022-07-01 DIAGNOSIS — E785 Hyperlipidemia, unspecified: Secondary | ICD-10-CM | POA: Diagnosis not present

## 2022-07-01 DIAGNOSIS — I10 Essential (primary) hypertension: Secondary | ICD-10-CM | POA: Diagnosis not present

## 2022-07-01 DIAGNOSIS — I739 Peripheral vascular disease, unspecified: Secondary | ICD-10-CM | POA: Diagnosis not present

## 2022-07-01 DIAGNOSIS — I69398 Other sequelae of cerebral infarction: Secondary | ICD-10-CM | POA: Diagnosis not present

## 2022-07-01 DIAGNOSIS — Z7982 Long term (current) use of aspirin: Secondary | ICD-10-CM | POA: Diagnosis not present

## 2022-07-07 DIAGNOSIS — I2510 Atherosclerotic heart disease of native coronary artery without angina pectoris: Secondary | ICD-10-CM | POA: Diagnosis not present

## 2022-07-07 DIAGNOSIS — I69318 Other symptoms and signs involving cognitive functions following cerebral infarction: Secondary | ICD-10-CM | POA: Diagnosis not present

## 2022-07-07 DIAGNOSIS — E785 Hyperlipidemia, unspecified: Secondary | ICD-10-CM | POA: Diagnosis not present

## 2022-07-07 DIAGNOSIS — I10 Essential (primary) hypertension: Secondary | ICD-10-CM | POA: Diagnosis not present

## 2022-07-07 DIAGNOSIS — I69398 Other sequelae of cerebral infarction: Secondary | ICD-10-CM | POA: Diagnosis not present

## 2022-07-07 DIAGNOSIS — I69354 Hemiplegia and hemiparesis following cerebral infarction affecting left non-dominant side: Secondary | ICD-10-CM | POA: Diagnosis not present

## 2022-07-07 DIAGNOSIS — I739 Peripheral vascular disease, unspecified: Secondary | ICD-10-CM | POA: Diagnosis not present

## 2022-07-07 DIAGNOSIS — Z7982 Long term (current) use of aspirin: Secondary | ICD-10-CM | POA: Diagnosis not present

## 2022-07-07 DIAGNOSIS — H5462 Unqualified visual loss, left eye, normal vision right eye: Secondary | ICD-10-CM | POA: Diagnosis not present

## 2022-07-15 DIAGNOSIS — I10 Essential (primary) hypertension: Secondary | ICD-10-CM | POA: Diagnosis not present

## 2022-07-15 DIAGNOSIS — Z7982 Long term (current) use of aspirin: Secondary | ICD-10-CM | POA: Diagnosis not present

## 2022-07-15 DIAGNOSIS — I739 Peripheral vascular disease, unspecified: Secondary | ICD-10-CM | POA: Diagnosis not present

## 2022-07-15 DIAGNOSIS — H5462 Unqualified visual loss, left eye, normal vision right eye: Secondary | ICD-10-CM | POA: Diagnosis not present

## 2022-07-15 DIAGNOSIS — I2510 Atherosclerotic heart disease of native coronary artery without angina pectoris: Secondary | ICD-10-CM | POA: Diagnosis not present

## 2022-07-15 DIAGNOSIS — I69398 Other sequelae of cerebral infarction: Secondary | ICD-10-CM | POA: Diagnosis not present

## 2022-07-15 DIAGNOSIS — I69318 Other symptoms and signs involving cognitive functions following cerebral infarction: Secondary | ICD-10-CM | POA: Diagnosis not present

## 2022-07-15 DIAGNOSIS — I69354 Hemiplegia and hemiparesis following cerebral infarction affecting left non-dominant side: Secondary | ICD-10-CM | POA: Diagnosis not present

## 2022-07-15 DIAGNOSIS — E785 Hyperlipidemia, unspecified: Secondary | ICD-10-CM | POA: Diagnosis not present

## 2022-07-20 DIAGNOSIS — E785 Hyperlipidemia, unspecified: Secondary | ICD-10-CM | POA: Diagnosis not present

## 2022-07-20 DIAGNOSIS — I739 Peripheral vascular disease, unspecified: Secondary | ICD-10-CM | POA: Diagnosis not present

## 2022-07-20 DIAGNOSIS — Z7982 Long term (current) use of aspirin: Secondary | ICD-10-CM | POA: Diagnosis not present

## 2022-07-20 DIAGNOSIS — I2510 Atherosclerotic heart disease of native coronary artery without angina pectoris: Secondary | ICD-10-CM | POA: Diagnosis not present

## 2022-07-20 DIAGNOSIS — I69354 Hemiplegia and hemiparesis following cerebral infarction affecting left non-dominant side: Secondary | ICD-10-CM | POA: Diagnosis not present

## 2022-07-20 DIAGNOSIS — H5462 Unqualified visual loss, left eye, normal vision right eye: Secondary | ICD-10-CM | POA: Diagnosis not present

## 2022-07-20 DIAGNOSIS — I69398 Other sequelae of cerebral infarction: Secondary | ICD-10-CM | POA: Diagnosis not present

## 2022-07-20 DIAGNOSIS — I10 Essential (primary) hypertension: Secondary | ICD-10-CM | POA: Diagnosis not present

## 2022-07-20 DIAGNOSIS — I69318 Other symptoms and signs involving cognitive functions following cerebral infarction: Secondary | ICD-10-CM | POA: Diagnosis not present

## 2022-07-22 DIAGNOSIS — I739 Peripheral vascular disease, unspecified: Secondary | ICD-10-CM | POA: Diagnosis not present

## 2022-07-22 DIAGNOSIS — Z7982 Long term (current) use of aspirin: Secondary | ICD-10-CM | POA: Diagnosis not present

## 2022-07-22 DIAGNOSIS — H5462 Unqualified visual loss, left eye, normal vision right eye: Secondary | ICD-10-CM | POA: Diagnosis not present

## 2022-07-22 DIAGNOSIS — I69354 Hemiplegia and hemiparesis following cerebral infarction affecting left non-dominant side: Secondary | ICD-10-CM | POA: Diagnosis not present

## 2022-07-22 DIAGNOSIS — I69318 Other symptoms and signs involving cognitive functions following cerebral infarction: Secondary | ICD-10-CM | POA: Diagnosis not present

## 2022-07-22 DIAGNOSIS — I69398 Other sequelae of cerebral infarction: Secondary | ICD-10-CM | POA: Diagnosis not present

## 2022-07-22 DIAGNOSIS — E785 Hyperlipidemia, unspecified: Secondary | ICD-10-CM | POA: Diagnosis not present

## 2022-07-22 DIAGNOSIS — I2510 Atherosclerotic heart disease of native coronary artery without angina pectoris: Secondary | ICD-10-CM | POA: Diagnosis not present

## 2022-07-22 DIAGNOSIS — I10 Essential (primary) hypertension: Secondary | ICD-10-CM | POA: Diagnosis not present

## 2022-08-03 DIAGNOSIS — J3800 Paralysis of vocal cords and larynx, unspecified: Secondary | ICD-10-CM | POA: Diagnosis not present

## 2022-08-03 DIAGNOSIS — J342 Deviated nasal septum: Secondary | ICD-10-CM | POA: Diagnosis not present

## 2022-08-03 DIAGNOSIS — R490 Dysphonia: Secondary | ICD-10-CM | POA: Diagnosis not present

## 2022-08-13 DIAGNOSIS — E539 Vitamin B deficiency, unspecified: Secondary | ICD-10-CM | POA: Diagnosis not present

## 2022-08-13 DIAGNOSIS — I10 Essential (primary) hypertension: Secondary | ICD-10-CM | POA: Diagnosis not present

## 2022-08-13 DIAGNOSIS — E559 Vitamin D deficiency, unspecified: Secondary | ICD-10-CM | POA: Diagnosis not present

## 2022-08-19 DIAGNOSIS — Z7982 Long term (current) use of aspirin: Secondary | ICD-10-CM | POA: Diagnosis not present

## 2022-08-19 DIAGNOSIS — H543 Unqualified visual loss, both eyes: Secondary | ICD-10-CM | POA: Diagnosis not present

## 2022-08-19 DIAGNOSIS — Z7902 Long term (current) use of antithrombotics/antiplatelets: Secondary | ICD-10-CM | POA: Diagnosis not present

## 2022-08-19 DIAGNOSIS — I10 Essential (primary) hypertension: Secondary | ICD-10-CM | POA: Diagnosis not present

## 2022-08-19 DIAGNOSIS — I69354 Hemiplegia and hemiparesis following cerebral infarction affecting left non-dominant side: Secondary | ICD-10-CM | POA: Diagnosis not present

## 2022-08-19 DIAGNOSIS — I69398 Other sequelae of cerebral infarction: Secondary | ICD-10-CM | POA: Diagnosis not present

## 2022-08-19 DIAGNOSIS — I2510 Atherosclerotic heart disease of native coronary artery without angina pectoris: Secondary | ICD-10-CM | POA: Diagnosis not present

## 2022-08-19 DIAGNOSIS — Z9181 History of falling: Secondary | ICD-10-CM | POA: Diagnosis not present

## 2022-08-19 DIAGNOSIS — E785 Hyperlipidemia, unspecified: Secondary | ICD-10-CM | POA: Diagnosis not present

## 2022-08-24 ENCOUNTER — Telehealth: Payer: Self-pay

## 2022-08-24 DIAGNOSIS — Z9181 History of falling: Secondary | ICD-10-CM | POA: Diagnosis not present

## 2022-08-24 DIAGNOSIS — I10 Essential (primary) hypertension: Secondary | ICD-10-CM | POA: Diagnosis not present

## 2022-08-24 DIAGNOSIS — E785 Hyperlipidemia, unspecified: Secondary | ICD-10-CM | POA: Diagnosis not present

## 2022-08-24 DIAGNOSIS — I69354 Hemiplegia and hemiparesis following cerebral infarction affecting left non-dominant side: Secondary | ICD-10-CM | POA: Diagnosis not present

## 2022-08-24 DIAGNOSIS — Z7902 Long term (current) use of antithrombotics/antiplatelets: Secondary | ICD-10-CM | POA: Diagnosis not present

## 2022-08-24 DIAGNOSIS — H543 Unqualified visual loss, both eyes: Secondary | ICD-10-CM | POA: Diagnosis not present

## 2022-08-24 DIAGNOSIS — I2510 Atherosclerotic heart disease of native coronary artery without angina pectoris: Secondary | ICD-10-CM | POA: Diagnosis not present

## 2022-08-24 DIAGNOSIS — I69398 Other sequelae of cerebral infarction: Secondary | ICD-10-CM | POA: Diagnosis not present

## 2022-08-24 DIAGNOSIS — Z7982 Long term (current) use of aspirin: Secondary | ICD-10-CM | POA: Diagnosis not present

## 2022-08-24 NOTE — Telephone Encounter (Unsigned)
Copied from CRM [id number]. Topic: General - Inquiry >> Aug 24, 2022  2:52 PM Lennox Pippins wrote: Maralyn Sago from [facility] called in regards to the form that was faxed over to Dr. Sherrie Mustache in regards to patients opiod utilization and per Sarah/[employer] request, to complete this response and fax it back to the number on the form. She stated no need in giving them a call back, please advise.

## 2022-08-30 DIAGNOSIS — Z7902 Long term (current) use of antithrombotics/antiplatelets: Secondary | ICD-10-CM | POA: Diagnosis not present

## 2022-08-30 DIAGNOSIS — E785 Hyperlipidemia, unspecified: Secondary | ICD-10-CM | POA: Diagnosis not present

## 2022-08-30 DIAGNOSIS — I69354 Hemiplegia and hemiparesis following cerebral infarction affecting left non-dominant side: Secondary | ICD-10-CM | POA: Diagnosis not present

## 2022-08-30 DIAGNOSIS — H543 Unqualified visual loss, both eyes: Secondary | ICD-10-CM | POA: Diagnosis not present

## 2022-08-30 DIAGNOSIS — I69398 Other sequelae of cerebral infarction: Secondary | ICD-10-CM | POA: Diagnosis not present

## 2022-08-30 DIAGNOSIS — Z7982 Long term (current) use of aspirin: Secondary | ICD-10-CM | POA: Diagnosis not present

## 2022-08-30 DIAGNOSIS — Z9181 History of falling: Secondary | ICD-10-CM | POA: Diagnosis not present

## 2022-08-30 DIAGNOSIS — I2510 Atherosclerotic heart disease of native coronary artery without angina pectoris: Secondary | ICD-10-CM | POA: Diagnosis not present

## 2022-08-30 DIAGNOSIS — I10 Essential (primary) hypertension: Secondary | ICD-10-CM | POA: Diagnosis not present

## 2022-09-14 DIAGNOSIS — H543 Unqualified visual loss, both eyes: Secondary | ICD-10-CM | POA: Diagnosis not present

## 2022-09-14 DIAGNOSIS — I10 Essential (primary) hypertension: Secondary | ICD-10-CM | POA: Diagnosis not present

## 2022-09-14 DIAGNOSIS — E785 Hyperlipidemia, unspecified: Secondary | ICD-10-CM | POA: Diagnosis not present

## 2022-09-14 DIAGNOSIS — Z9181 History of falling: Secondary | ICD-10-CM | POA: Diagnosis not present

## 2022-09-14 DIAGNOSIS — I69398 Other sequelae of cerebral infarction: Secondary | ICD-10-CM | POA: Diagnosis not present

## 2022-09-14 DIAGNOSIS — I69354 Hemiplegia and hemiparesis following cerebral infarction affecting left non-dominant side: Secondary | ICD-10-CM | POA: Diagnosis not present

## 2022-09-14 DIAGNOSIS — Z7982 Long term (current) use of aspirin: Secondary | ICD-10-CM | POA: Diagnosis not present

## 2022-09-14 DIAGNOSIS — I2510 Atherosclerotic heart disease of native coronary artery without angina pectoris: Secondary | ICD-10-CM | POA: Diagnosis not present

## 2022-09-14 DIAGNOSIS — Z7902 Long term (current) use of antithrombotics/antiplatelets: Secondary | ICD-10-CM | POA: Diagnosis not present

## 2022-09-28 DIAGNOSIS — E785 Hyperlipidemia, unspecified: Secondary | ICD-10-CM | POA: Diagnosis not present

## 2022-09-28 DIAGNOSIS — Z9181 History of falling: Secondary | ICD-10-CM | POA: Diagnosis not present

## 2022-09-28 DIAGNOSIS — Z7902 Long term (current) use of antithrombotics/antiplatelets: Secondary | ICD-10-CM | POA: Diagnosis not present

## 2022-09-28 DIAGNOSIS — Z7982 Long term (current) use of aspirin: Secondary | ICD-10-CM | POA: Diagnosis not present

## 2022-09-28 DIAGNOSIS — I2510 Atherosclerotic heart disease of native coronary artery without angina pectoris: Secondary | ICD-10-CM | POA: Diagnosis not present

## 2022-09-28 DIAGNOSIS — I69354 Hemiplegia and hemiparesis following cerebral infarction affecting left non-dominant side: Secondary | ICD-10-CM | POA: Diagnosis not present

## 2022-09-28 DIAGNOSIS — H543 Unqualified visual loss, both eyes: Secondary | ICD-10-CM | POA: Diagnosis not present

## 2022-09-28 DIAGNOSIS — I10 Essential (primary) hypertension: Secondary | ICD-10-CM | POA: Diagnosis not present

## 2022-09-28 DIAGNOSIS — I69398 Other sequelae of cerebral infarction: Secondary | ICD-10-CM | POA: Diagnosis not present

## 2022-10-06 DIAGNOSIS — I69398 Other sequelae of cerebral infarction: Secondary | ICD-10-CM | POA: Diagnosis not present

## 2022-10-06 DIAGNOSIS — Z7982 Long term (current) use of aspirin: Secondary | ICD-10-CM | POA: Diagnosis not present

## 2022-10-06 DIAGNOSIS — Z9181 History of falling: Secondary | ICD-10-CM | POA: Diagnosis not present

## 2022-10-06 DIAGNOSIS — I10 Essential (primary) hypertension: Secondary | ICD-10-CM | POA: Diagnosis not present

## 2022-10-06 DIAGNOSIS — E785 Hyperlipidemia, unspecified: Secondary | ICD-10-CM | POA: Diagnosis not present

## 2022-10-06 DIAGNOSIS — I2510 Atherosclerotic heart disease of native coronary artery without angina pectoris: Secondary | ICD-10-CM | POA: Diagnosis not present

## 2022-10-06 DIAGNOSIS — Z7902 Long term (current) use of antithrombotics/antiplatelets: Secondary | ICD-10-CM | POA: Diagnosis not present

## 2022-10-06 DIAGNOSIS — H543 Unqualified visual loss, both eyes: Secondary | ICD-10-CM | POA: Diagnosis not present

## 2022-10-06 DIAGNOSIS — I69354 Hemiplegia and hemiparesis following cerebral infarction affecting left non-dominant side: Secondary | ICD-10-CM | POA: Diagnosis not present

## 2023-02-09 ENCOUNTER — Ambulatory Visit: Payer: Medicare HMO | Admitting: Emergency Medicine

## 2023-02-09 NOTE — Progress Notes (Signed)
 This encounter was created in error - please disregard. Patient's daughter called and stated that patient was seeing a new PCP. No longer goes to [facility].
